# Patient Record
Sex: Male | Born: 1965 | Race: Black or African American | Hispanic: No
Health system: Southern US, Academic
[De-identification: ages and names within clinical notes are randomized; demographics above are authoritative.]

## PROBLEM LIST (undated history)

## (undated) ENCOUNTER — Ambulatory Visit

## (undated) ENCOUNTER — Ambulatory Visit: Payer: MEDICARE

## (undated) ENCOUNTER — Encounter: Attending: Infectious Disease | Primary: Infectious Disease

## (undated) ENCOUNTER — Encounter: Attending: Physician Assistant | Primary: Physician Assistant

## (undated) ENCOUNTER — Telehealth

## (undated) ENCOUNTER — Telehealth: Attending: Infectious Disease | Primary: Infectious Disease

## (undated) ENCOUNTER — Encounter: Attending: Internal Medicine | Primary: Internal Medicine

## (undated) ENCOUNTER — Encounter

## (undated) ENCOUNTER — Ambulatory Visit: Payer: MEDICAID

## (undated) ENCOUNTER — Telehealth: Attending: Registered" | Primary: Registered"

## (undated) ENCOUNTER — Encounter
Attending: Student in an Organized Health Care Education/Training Program | Primary: Student in an Organized Health Care Education/Training Program

## (undated) ENCOUNTER — Ambulatory Visit: Payer: MEDICARE | Attending: Infectious Disease | Primary: Infectious Disease

## (undated) ENCOUNTER — Ambulatory Visit: Payer: MEDICARE | Attending: Dermatology | Primary: Dermatology

## (undated) ENCOUNTER — Ambulatory Visit: Payer: MEDICAID | Attending: Infectious Disease | Primary: Infectious Disease

## (undated) ENCOUNTER — Telehealth
Attending: Student in an Organized Health Care Education/Training Program | Primary: Student in an Organized Health Care Education/Training Program

## (undated) ENCOUNTER — Telehealth: Attending: Dermatology | Primary: Dermatology

## (undated) ENCOUNTER — Encounter: Attending: Dermatology | Primary: Dermatology

## (undated) ENCOUNTER — Ambulatory Visit: Payer: Medicare (Managed Care)

## (undated) ENCOUNTER — Ambulatory Visit: Payer: MEDICARE | Attending: Family | Primary: Family

## (undated) ENCOUNTER — Ambulatory Visit: Payer: MEDICARE | Attending: General Practice | Primary: General Practice

## (undated) ENCOUNTER — Ambulatory Visit: Payer: MEDICARE | Attending: Physician Assistant | Primary: Physician Assistant

## (undated) ENCOUNTER — Ambulatory Visit: Payer: Medicare (Managed Care) | Attending: Physician Assistant | Primary: Physician Assistant

## (undated) ENCOUNTER — Ambulatory Visit
Payer: MEDICARE | Attending: Student in an Organized Health Care Education/Training Program | Primary: Student in an Organized Health Care Education/Training Program

## (undated) ENCOUNTER — Encounter: Attending: Gastroenterology | Primary: Gastroenterology

## (undated) ENCOUNTER — Ambulatory Visit: Payer: MEDICARE | Attending: Addiction (Substance Use Disorder) | Primary: Addiction (Substance Use Disorder)

## (undated) ENCOUNTER — Ambulatory Visit
Attending: Student in an Organized Health Care Education/Training Program | Primary: Student in an Organized Health Care Education/Training Program

## (undated) ENCOUNTER — Encounter: Attending: Addiction (Substance Use Disorder) | Primary: Addiction (Substance Use Disorder)

## (undated) ENCOUNTER — Ambulatory Visit: Payer: MEDICARE | Attending: Registered" | Primary: Registered"

## (undated) ENCOUNTER — Ambulatory Visit
Payer: Medicare (Managed Care) | Attending: Student in an Organized Health Care Education/Training Program | Primary: Student in an Organized Health Care Education/Training Program

## (undated) ENCOUNTER — Ambulatory Visit: Attending: Infectious Disease | Primary: Infectious Disease

## (undated) DIAGNOSIS — I10 Essential (primary) hypertension: Secondary | ICD-10-CM

## (undated) DIAGNOSIS — G459 Transient cerebral ischemic attack, unspecified: Secondary | ICD-10-CM

## (undated) DIAGNOSIS — J449 Chronic obstructive pulmonary disease, unspecified: Secondary | ICD-10-CM

## (undated) DIAGNOSIS — K859 Acute pancreatitis without necrosis or infection, unspecified: Secondary | ICD-10-CM

## (undated) DIAGNOSIS — K219 Gastro-esophageal reflux disease without esophagitis: Secondary | ICD-10-CM

## (undated) DIAGNOSIS — Z720 Tobacco use: Secondary | ICD-10-CM

## (undated) DIAGNOSIS — I8290 Acute embolism and thrombosis of unspecified vein: Secondary | ICD-10-CM

## (undated) DIAGNOSIS — Z8661 Personal history of infections of the central nervous system: Secondary | ICD-10-CM

## (undated) DIAGNOSIS — E119 Type 2 diabetes mellitus without complications: Secondary | ICD-10-CM

## (undated) DIAGNOSIS — B20 Human immunodeficiency virus [HIV] disease: Secondary | ICD-10-CM

## (undated) DIAGNOSIS — K862 Cyst of pancreas: Secondary | ICD-10-CM

## (undated) DIAGNOSIS — M542 Cervicalgia: Secondary | ICD-10-CM

## (undated) HISTORY — PX: ESOPHAGEAL ATRESIA REPAIR: SHX1525

---
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
## 2003-09-27 ENCOUNTER — Other Ambulatory Visit: Payer: Self-pay

## 2005-02-01 ENCOUNTER — Other Ambulatory Visit: Payer: Self-pay

## 2005-02-01 ENCOUNTER — Emergency Department: Payer: Self-pay | Admitting: Emergency Medicine

## 2005-05-13 ENCOUNTER — Emergency Department: Payer: Self-pay | Admitting: Emergency Medicine

## 2005-07-23 ENCOUNTER — Emergency Department: Payer: Self-pay | Admitting: Emergency Medicine

## 2006-02-12 ENCOUNTER — Emergency Department: Payer: Self-pay | Admitting: Emergency Medicine

## 2006-12-30 ENCOUNTER — Emergency Department: Payer: Self-pay | Admitting: Unknown Physician Specialty

## 2007-05-21 ENCOUNTER — Emergency Department: Payer: Self-pay | Admitting: Unknown Physician Specialty

## 2007-06-21 ENCOUNTER — Emergency Department: Payer: Self-pay | Admitting: Emergency Medicine

## 2008-10-26 ENCOUNTER — Emergency Department: Payer: Self-pay | Admitting: Emergency Medicine

## 2009-03-24 ENCOUNTER — Inpatient Hospital Stay: Payer: Self-pay | Admitting: Internal Medicine

## 2010-11-07 DIAGNOSIS — H31009 Unspecified chorioretinal scars, unspecified eye: Secondary | ICD-10-CM | POA: Insufficient documentation

## 2011-01-25 ENCOUNTER — Emergency Department: Payer: Self-pay | Admitting: Emergency Medicine

## 2011-05-26 ENCOUNTER — Emergency Department: Payer: Self-pay | Admitting: Internal Medicine

## 2011-07-29 ENCOUNTER — Emergency Department: Payer: Self-pay | Admitting: Emergency Medicine

## 2011-07-29 LAB — CBC WITH DIFFERENTIAL/PLATELET
Basophil #: 0 10*3/uL (ref 0.0–0.1)
Eosinophil #: 0.3 10*3/uL (ref 0.0–0.7)
HCT: 43.4 % (ref 40.0–52.0)
Lymphocyte #: 1.2 10*3/uL (ref 1.0–3.6)
Lymphocyte %: 8.4 %
MCHC: 34.4 g/dL (ref 32.0–36.0)
MCV: 115 fL — ABNORMAL HIGH (ref 80–100)
Neutrophil #: 12.1 10*3/uL — ABNORMAL HIGH (ref 1.4–6.5)
Platelet: 365 10*3/uL (ref 150–440)
RBC: 3.77 10*6/uL — ABNORMAL LOW (ref 4.40–5.90)
RDW: 14.5 % (ref 11.5–14.5)

## 2011-07-29 LAB — URINALYSIS, COMPLETE
Ketone: NEGATIVE
Leukocyte Esterase: NEGATIVE
Nitrite: NEGATIVE
Ph: 7 (ref 4.5–8.0)
Protein: NEGATIVE
RBC,UR: <1 /HPF (ref 0–5)
Squamous Epithelial: 1
WBC UR: <1 /HPF (ref 0–5)

## 2011-07-29 LAB — COMPREHENSIVE METABOLIC PANEL
Albumin: 3.9 g/dL (ref 3.4–5.0)
Anion Gap: 13 (ref 7–16)
BUN: 6 mg/dL — ABNORMAL LOW (ref 7–18)
Bilirubin,Total: 0.5 mg/dL (ref 0.2–1.0)
Chloride: 101 mmol/L (ref 98–107)
Creatinine: 0.88 mg/dL (ref 0.60–1.30)
EGFR (Non-African Amer.): >60
Glucose: 98 mg/dL (ref 65–99)
Potassium: 3.4 mmol/L — ABNORMAL LOW (ref 3.5–5.1)
SGOT(AST): 16 U/L (ref 15–37)
SGPT (ALT): 21 U/L
Total Protein: 7.8 g/dL (ref 6.4–8.2)

## 2013-02-15 DIAGNOSIS — K859 Acute pancreatitis without necrosis or infection, unspecified: Secondary | ICD-10-CM | POA: Insufficient documentation

## 2013-02-15 DIAGNOSIS — F17200 Nicotine dependence, unspecified, uncomplicated: Secondary | ICD-10-CM | POA: Insufficient documentation

## 2013-06-18 ENCOUNTER — Emergency Department: Payer: Self-pay | Admitting: Emergency Medicine

## 2013-06-18 LAB — RAPID INFLUENZA A&B ANTIGENS

## 2013-10-25 DIAGNOSIS — I82409 Acute embolism and thrombosis of unspecified deep veins of unspecified lower extremity: Secondary | ICD-10-CM | POA: Insufficient documentation

## 2014-01-10 ENCOUNTER — Inpatient Hospital Stay: Payer: Self-pay | Admitting: Internal Medicine

## 2014-01-10 LAB — CBC WITH DIFFERENTIAL/PLATELET
BASOS ABS: 0.1 10*3/uL (ref 0.0–0.1)
BASOS PCT: 0.9 %
BASOS PCT: 1 %
Basophil #: 0.1 10*3/uL (ref 0.0–0.1)
EOS ABS: 0.1 10*3/uL (ref 0.0–0.7)
EOS PCT: 0.8 %
EOS PCT: 1.3 %
Eosinophil #: 0.1 10*3/uL (ref 0.0–0.7)
HCT: 38.7 % — AB (ref 40.0–52.0)
HCT: 37.9 % — ABNORMAL LOW (ref 40.0–52.0)
HGB: 12.9 g/dL — ABNORMAL LOW (ref 13.0–18.0)
HGB: 12.6 g/dL — ABNORMAL LOW (ref 13.0–18.0)
LYMPHS PCT: 27.5 %
Lymphocyte #: 1.8 10*3/uL (ref 1.0–3.6)
Lymphocyte #: 2.1 10*3/uL (ref 1.0–3.6)
Lymphocyte %: 22.4 %
MCH: 30 pg (ref 26.0–34.0)
MCH: 30.1 pg (ref 26.0–34.0)
MCHC: 33.3 g/dL (ref 32.0–36.0)
MCHC: 33.2 g/dL (ref 32.0–36.0)
MCV: 90 fL (ref 80–100)
MCV: 91 fL (ref 80–100)
MONO ABS: 0.7 x10 3/mm (ref 0.2–1.0)
MONOS PCT: 8.9 %
Monocyte #: 0.8 x10 3/mm (ref 0.2–1.0)
Monocyte %: 10.1 %
NEUTROS ABS: 4.6 10*3/uL (ref 1.4–6.5)
Neutrophil #: 5.3 10*3/uL (ref 1.4–6.5)
Neutrophil %: 65.8 %
Neutrophil %: 61.3 %
Platelet: 249 10*3/uL (ref 150–440)
Platelet: 252 10*3/uL (ref 150–440)
RBC: 4.29 10*6/uL — AB (ref 4.40–5.90)
RBC: 4.18 10*6/uL — ABNORMAL LOW (ref 4.40–5.90)
RDW: 14.8 % — AB (ref 11.5–14.5)
RDW: 15.1 % — AB (ref 11.5–14.5)
WBC: 8.1 10*3/uL (ref 3.8–10.6)
WBC: 7.6 10*3/uL (ref 3.8–10.6)

## 2014-01-10 LAB — COMPREHENSIVE METABOLIC PANEL
ALBUMIN: 3.6 g/dL (ref 3.4–5.0)
ALK PHOS: 98 U/L
Anion Gap: 12 (ref 7–16)
BILIRUBIN TOTAL: 0.3 mg/dL (ref 0.2–1.0)
BUN: 6 mg/dL — ABNORMAL LOW (ref 7–18)
Calcium, Total: 8.7 mg/dL (ref 8.5–10.1)
Chloride: 100 mmol/L (ref 98–107)
Co2: 27 mmol/L (ref 21–32)
Creatinine: 0.95 mg/dL (ref 0.60–1.30)
EGFR (Non-African Amer.): >60
GLUCOSE: 114 mg/dL — AB (ref 65–99)
Osmolality: 276 (ref 275–301)
Potassium: 2.6 mmol/L — ABNORMAL LOW (ref 3.5–5.1)
SGOT(AST): 18 U/L (ref 15–37)
SGPT (ALT): 21 U/L (ref 12–78)
Sodium: 139 mmol/L (ref 136–145)
Total Protein: 7.2 g/dL (ref 6.4–8.2)

## 2014-01-10 LAB — URINALYSIS, COMPLETE
Bacteria: NONE SEEN
Bilirubin,UR: NEGATIVE
Glucose,UR: NEGATIVE mg/dL (ref 0–75)
Ketone: NEGATIVE
LEUKOCYTE ESTERASE: NEGATIVE
Nitrite: NEGATIVE
Ph: 5 (ref 4.5–8.0)
Protein: NEGATIVE
SPECIFIC GRAVITY: 1.023 (ref 1.003–1.030)
Squamous Epithelial: NONE SEEN
WBC UR: 2 /HPF (ref 0–5)

## 2014-01-10 LAB — CBC
HCT: 44.2 % (ref 40.0–52.0)
HGB: 14.5 g/dL (ref 13.0–18.0)
MCH: 29.6 pg (ref 26.0–34.0)
MCHC: 32.8 g/dL (ref 32.0–36.0)
MCV: 90 fL (ref 80–100)
PLATELETS: 265 10*3/uL (ref 150–440)
RBC: 4.9 10*6/uL (ref 4.40–5.90)
RDW: 15.2 % — AB (ref 11.5–14.5)
WBC: 7.1 10*3/uL (ref 3.8–10.6)

## 2014-01-10 LAB — MAGNESIUM: MAGNESIUM: 1.5 mg/dL — AB

## 2014-01-10 LAB — PROTIME-INR
INR: >15.1
INR: 2
Prothrombin Time: 22.2 secs — ABNORMAL HIGH (ref 11.5–14.7)

## 2014-01-10 LAB — ETHANOL: Ethanol %: <0.003 % (ref 0.000–0.080)

## 2014-01-10 LAB — LIPASE, BLOOD: LIPASE: 177 U/L (ref 73–393)

## 2014-01-10 LAB — AMYLASE: AMYLASE: 53 U/L (ref 25–115)

## 2014-01-10 LAB — APTT: Activated PTT: 130.8 secs — ABNORMAL HIGH (ref 23.6–35.9)

## 2014-01-11 LAB — CBC WITH DIFFERENTIAL/PLATELET
BASOS ABS: 0.1 10*3/uL (ref 0.0–0.1)
Basophil %: 1 %
EOS ABS: 0.1 10*3/uL (ref 0.0–0.7)
EOS PCT: 2.6 %
HCT: 35.9 % — AB (ref 40.0–52.0)
HGB: 11.9 g/dL — AB (ref 13.0–18.0)
LYMPHS PCT: 44 %
Lymphocyte #: 2.3 10*3/uL (ref 1.0–3.6)
MCH: 30.1 pg (ref 26.0–34.0)
MCHC: 33 g/dL (ref 32.0–36.0)
MCV: 91 fL (ref 80–100)
Monocyte #: 0.5 x10 3/mm (ref 0.2–1.0)
Monocyte %: 9.2 %
NEUTROS ABS: 2.2 10*3/uL (ref 1.4–6.5)
NEUTROS PCT: 43.2 %
PLATELETS: 239 10*3/uL (ref 150–440)
RBC: 3.93 10*6/uL — ABNORMAL LOW (ref 4.40–5.90)
RDW: 15 % — AB (ref 11.5–14.5)
WBC: 5.2 10*3/uL (ref 3.8–10.6)

## 2014-01-11 LAB — PROTIME-INR
INR: 2.3
Prothrombin Time: 24.5 secs — ABNORMAL HIGH (ref 11.5–14.7)

## 2014-01-11 LAB — POTASSIUM: Potassium: 3.7 mmol/L (ref 3.5–5.1)

## 2014-01-20 ENCOUNTER — Inpatient Hospital Stay: Payer: Self-pay | Admitting: Specialist

## 2014-01-20 LAB — CBC WITH DIFFERENTIAL/PLATELET
BASOS ABS: 0.1 10*3/uL (ref 0.0–0.1)
BASOS PCT: 1.4 %
EOS ABS: 0.2 10*3/uL (ref 0.0–0.7)
Eosinophil %: 2.2 %
HCT: 40.2 % (ref 40.0–52.0)
HGB: 13.2 g/dL (ref 13.0–18.0)
LYMPHS PCT: 28.9 %
Lymphocyte #: 2.2 10*3/uL (ref 1.0–3.6)
MCH: 30 pg (ref 26.0–34.0)
MCHC: 32.8 g/dL (ref 32.0–36.0)
MCV: 91 fL (ref 80–100)
MONO ABS: 0.6 x10 3/mm (ref 0.2–1.0)
Monocyte %: 8.4 %
NEUTROS ABS: 4.4 10*3/uL (ref 1.4–6.5)
Neutrophil %: 59.1 %
PLATELETS: 349 10*3/uL (ref 150–440)
RBC: 4.41 10*6/uL (ref 4.40–5.90)
RDW: 15.7 % — AB (ref 11.5–14.5)
WBC: 7.4 10*3/uL (ref 3.8–10.6)

## 2014-01-20 LAB — COMPREHENSIVE METABOLIC PANEL
ANION GAP: 10 (ref 7–16)
AST: 11 U/L — AB (ref 15–37)
Albumin: 3.5 g/dL (ref 3.4–5.0)
Alkaline Phosphatase: 99 U/L
BILIRUBIN TOTAL: 0.3 mg/dL (ref 0.2–1.0)
BUN: 10 mg/dL (ref 7–18)
CALCIUM: 8.7 mg/dL (ref 8.5–10.1)
CO2: 24 mmol/L (ref 21–32)
Chloride: 107 mmol/L (ref 98–107)
Creatinine: 0.98 mg/dL (ref 0.60–1.30)
EGFR (African American): >60
EGFR (Non-African Amer.): >60
Glucose: 138 mg/dL — ABNORMAL HIGH (ref 65–99)
Osmolality: 282 (ref 275–301)
Potassium: 2.9 mmol/L — ABNORMAL LOW (ref 3.5–5.1)
SGPT (ALT): 18 U/L
Sodium: 141 mmol/L (ref 136–145)
TOTAL PROTEIN: 6.8 g/dL (ref 6.4–8.2)

## 2014-01-20 LAB — URINALYSIS, COMPLETE
BILIRUBIN, UR: NEGATIVE
Bacteria: NONE SEEN
GLUCOSE, UR: NEGATIVE mg/dL (ref 0–75)
Ketone: NEGATIVE
LEUKOCYTE ESTERASE: NEGATIVE
Nitrite: NEGATIVE
Ph: 5 (ref 4.5–8.0)
Protein: 30
SPECIFIC GRAVITY: 1.025 (ref 1.003–1.030)
SQUAMOUS EPITHELIAL: NONE SEEN
WBC UR: 7 /HPF (ref 0–5)

## 2014-01-20 LAB — PROTIME-INR
INR: 10.4
PROTHROMBIN TIME: 78 s — AB (ref 11.5–14.7)

## 2014-01-21 LAB — CBC WITH DIFFERENTIAL/PLATELET
BASOS PCT: 0.8 %
Basophil #: 0.1 10*3/uL (ref 0.0–0.1)
Eosinophil #: 0.2 10*3/uL (ref 0.0–0.7)
Eosinophil %: 2.8 %
HCT: 37.1 % — ABNORMAL LOW (ref 40.0–52.0)
HGB: 12.4 g/dL — AB (ref 13.0–18.0)
Lymphocyte #: 2.4 10*3/uL (ref 1.0–3.6)
Lymphocyte %: 36.5 %
MCH: 30.3 pg (ref 26.0–34.0)
MCHC: 33.5 g/dL (ref 32.0–36.0)
MCV: 90 fL (ref 80–100)
MONO ABS: 0.6 x10 3/mm (ref 0.2–1.0)
Monocyte %: 9.8 %
Neutrophil #: 3.3 10*3/uL (ref 1.4–6.5)
Neutrophil %: 50.1 %
Platelet: 327 10*3/uL (ref 150–440)
RBC: 4.1 10*6/uL — ABNORMAL LOW (ref 4.40–5.90)
RDW: 15.6 % — ABNORMAL HIGH (ref 11.5–14.5)
WBC: 6.6 10*3/uL (ref 3.8–10.6)

## 2014-01-21 LAB — PROTIME-INR
INR: 14.5
Prothrombin Time: 100.8 secs — ABNORMAL HIGH (ref 11.5–14.7)

## 2014-01-22 LAB — PROTIME-INR
INR: 2.1
PROTHROMBIN TIME: 22.8 s — AB (ref 11.5–14.7)

## 2014-08-04 ENCOUNTER — Emergency Department: Payer: Self-pay | Admitting: Emergency Medicine

## 2014-08-04 LAB — CBC WITH DIFFERENTIAL/PLATELET
Basophil #: 0 10*3/uL (ref 0.0–0.1)
Basophil %: 0.6 %
EOS PCT: 2.7 %
Eosinophil #: 0.2 10*3/uL (ref 0.0–0.7)
HCT: 40.9 % (ref 40.0–52.0)
HGB: 13.8 g/dL (ref 13.0–18.0)
LYMPHS ABS: 2.8 10*3/uL (ref 1.0–3.6)
Lymphocyte %: 39.6 %
MCH: 31.4 pg (ref 26.0–34.0)
MCHC: 33.7 g/dL (ref 32.0–36.0)
MCV: 93 fL (ref 80–100)
MONOS PCT: 7.9 %
Monocyte #: 0.6 x10 3/mm (ref 0.2–1.0)
NEUTROS PCT: 49.2 %
Neutrophil #: 3.5 10*3/uL (ref 1.4–6.5)
Platelet: 282 10*3/uL (ref 150–440)
RBC: 4.39 10*6/uL — AB (ref 4.40–5.90)
RDW: 14 % (ref 11.5–14.5)
WBC: 7 10*3/uL (ref 3.8–10.6)

## 2014-08-04 LAB — URINALYSIS, COMPLETE
Bacteria: NONE SEEN
Bilirubin,UR: NEGATIVE
Blood: NEGATIVE
Glucose,UR: NEGATIVE mg/dL (ref 0–75)
Ketone: NEGATIVE
Leukocyte Esterase: NEGATIVE
NITRITE: NEGATIVE
Ph: 5 (ref 4.5–8.0)
Specific Gravity: 1.034 (ref 1.003–1.030)
Squamous Epithelial: 1

## 2014-08-04 LAB — COMPREHENSIVE METABOLIC PANEL
ALT: 13 U/L — AB (ref 14–63)
ANION GAP: 8 (ref 7–16)
Albumin: 3.6 g/dL (ref 3.4–5.0)
Alkaline Phosphatase: 75 U/L (ref 46–116)
BUN: 10 mg/dL (ref 7–18)
Bilirubin,Total: 0.2 mg/dL (ref 0.2–1.0)
CALCIUM: 9.1 mg/dL (ref 8.5–10.1)
CHLORIDE: 109 mmol/L — AB (ref 98–107)
Co2: 26 mmol/L (ref 21–32)
Creatinine: 0.91 mg/dL (ref 0.60–1.30)
EGFR (Non-African Amer.): >60
Glucose: 136 mg/dL — ABNORMAL HIGH (ref 65–99)
Osmolality: 286 (ref 275–301)
Potassium: 3.2 mmol/L — ABNORMAL LOW (ref 3.5–5.1)
SGOT(AST): 13 U/L — ABNORMAL LOW (ref 15–37)
Sodium: 143 mmol/L (ref 136–145)
Total Protein: 6.7 g/dL (ref 6.4–8.2)

## 2014-08-04 LAB — LIPASE, BLOOD: LIPASE: 448 U/L — AB (ref 73–393)

## 2014-08-04 LAB — TROPONIN I: Troponin-I: <0.02 ng/mL

## 2014-10-22 NOTE — H&P (Signed)
PATIENT NAME:  Mason Martin, Mason Martin MR#:  [id number] DATE OF BIRTH:  07-22-1965  DATE OF ADMISSION:  01/20/2014  PRIMARY CARE PHYSICIAN: [facility].   CHIEF COMPLAINT: The patient's INR is high.   HISTORY OF PRESENT ILLNESS: Mason Martin is a 49 year old male with a history of HIV, hypertension. Was diagnosed with thrombosis of one of the abdominal vessels and has been on Coumadin. The patient was admitted on 01/10/2014 for supertherapeutic INR with an INR of greater than 15. The patient received vitamin K with improvement of the INR to 2. The patient was also found to have left hand cellulitis. The patient was discharged with Keflex. The patient followed up at [facility], and today, the patient was called to go to the Emergency Department concerning about the patient's high INR of greater than 16 and the patient was also found to have hematuria.. Denies having any abdominal pain. Repeat INR in the Emergency Department shows INR of 10.4. Marland Kitchen Urine shows 683 RBCs.   PAST MEDICAL HISTORY:  1. HIV, on HAART therapy.  2. COPD.   3. Previous history of stroke.  4. History of meningitis.  5. Gastroesophageal reflux disease.    PAST SURGICAL HISTORY: Esophageal trauma from endoscopy requiring surgical repair.   ALLERGIES: SULFA DRUGS.   HOME MEDICATIONS:  1. Coumadin 2 mg once a day.  2.  50 mg once a day.  3. ProAir 2 puffs 4 times a day.  4. Prezista 800 mg once a day.  5. Omeprazole 20 mg once a day.  6. Norvir 100 mg 2 times a day.  7. Nexium 40 mg 2 times a day.  8. Enalapril 20 mg once a day.  9. Creon 24,000 units 3 times a day.  10. Cetirizine 10 mg once a day.  11. Keflex 500 mg 4 times a day.  12. Norco 5/325 mg every 6 hours as needed.   SOCIAL HISTORY: Continues to smoke 1 pack in 2 to 3 days. Drinks alcohol 1 to 2 beers on a daily basis. Denies using any illicit drugs. Currently lives with his girlfriend and her daughter.   FAMILY HISTORY: Father died of emphysema and  congestive heart failure.   REVIEW OF SYSTEMS:  CONSTITUTIONAL: Denies any generalized weakness.  EYES: No change in vision.  ENT: No change in hearing.  RESPIRATORY: No cough, shortness of breath.  CARDIOVASCULAR: No chest pain, palpitations.  GASTROINTESTINAL: No nausea, vomiting, abdominal pain.  GENITOURINARY: No dysuria .   HEMATOLOGIC: Has noticed to have some blood in the stool.  ENDOCRINE: No polyuria or polydipsia.  NEUROLOGIC: No weakness or numbness in any part of the body.  SKIN: No rash or lesions.  MUSCULOSKELETAL: No joint pains and aches.   PHYSICAL EXAMINATION:  GENERAL: This is a thin-built male lying down in the bed, not in distress.  VITAL SIGNS: Temperature 98.3, pulse 67, blood pressure 121/82, respiratory rate of 19, oxygen saturations 98% on room air.  HEENT: Head normocephalic, atraumatic. There is no scleral icterus. Conjunctivae normal. Pupils equal and reactive. Extraocular movements are intact. Mucous membranes moist. No pharyngeal erythema.  NECK: Supple. No lymphadenopathy. No JVD. No carotid bruit.  CHEST: Has no focal tenderness.  LUNGS: Bilaterally clear to auscultation.  HEART: S1, S2 regular. No murmurs are heard.  ABDOMEN: Bowel sounds present. Soft, nontender, nondistended. No hepatosplenomegaly.  EXTREMITIES: No pedal edema. Pulses 2+.  SKIN: No rash or lesions.  MUSCULOSKELETAL: Good range of motion in all of the extremities.  NEUROLOGIC: The patient is alert, oriented to place, person and time. Cranial nerves II through XII intact. Motor 5/5 in upper and lower extremities.   LABORATORY DATA: CBC and CMP are completely within normal limits except for potassium of 2.9. PT 78, INR of 10.4. Urinalysis shows 683 RBCs, negative for nitrites and leukocyte esterase. Stool occult is positive.   ASSESSMENT AND PLAN: Mason Martin is a 49 year old with known history of human immunodeficiency virus who comes with supertherapeutic INR, with hematuria and stool  occult being positive.  1. Supertherapeutic INR from Coumadin toxicity: Hold the Coumadin. Give 5 mg of vitamin K. Follow up the INR in the morning. The patient does not have any gross hematuria or gross gastrointestinal bleed. Will continue to follow up. The patient's hemoglobin is currently in acceptable drainage. Will repeat the CBC in the morning. This most likely has worsened secondary to antibiotics.  2. Hypokalemia: Will replace by mouth.  3. Hematuria: The patient will need to have a repeat urinalysis once INR is in the therapeutic range. Considering the patient's history of heavy tobacco use as well as history of human immunodeficiency virus, the patient may benefit from evaluating the patient's hematuria if hematuria persists.  4. Human immunodeficiency virus: Continue with the home medications.  5. Gastroesophageal reflux disease: Continue with Protonix.  6. The patient is already on therpaeutic INR which should provide deep vein prophylaxis.   TIME SPENT: 55 minutes.   ____________________________ Susa GriffinsPadmaja Paden Kuras, MD pv:gb D: 01/20/2014 23:17:35 ET T: 01/21/2014 00:05:27 ET JOB#: [id number]  cc: Susa GriffinsPadmaja Kristof Nadeem, MD, <Dictator> Susa GriffinsPADMAJA Molly Savarino MD ELECTRONICALLY SIGNED 01/23/2014 21:15

## 2014-10-22 NOTE — Discharge Summary (Signed)
PATIENT NAME:  Mason Martin, Mason Martin MR#:  [id number] DATE OF BIRTH:  1965-08-10  DATE OF ADMISSION:  01/10/2014 DATE OF DISCHARGE:  01/11/2014  DISCHARGE DIAGNOSES:  1.  Abdominal pain, nausea and vomiting secondary to constipation, resolved. 2.  Human immunodeficiency virus on HAART medication.  3.  Hypokalemia.  4.  Left hand cellulitis.  5.  Chronic pancreatitis.   DISCHARGE MEDICATIONS: 1.  Keflex 500 mg p.o. 4 times daily for 10 days.  2.  Omeprazole 20 mg p.o. daily. 3.  Percocet 5/325 one  tab q 6hr  prn 4.  Coumadin 2 mg p.o. daily.  5.  keflex 500 mg po 4 times daily  6.  Prezista 800 mg p.o. daily. 7.  Nexium 40 mg p.o. b.i.d. 8.  Norvir 100 mg p.o. b.i.d.  9.  Cetirizine 10 mg daily. 10.   Creon 24,000/76,000/120,000 one capsule p.o. t.i.d.   CONSULTATIONS: None.   HOSPITAL COURSE:  1.  The patient is 49 year old male patient [facility] came in because of abdominal pain, nausea and vomiting, found to have constipation. His CAT scan showed ileus, did not show any acute changes, so the patient was started on stool softeners and he had a bowel movement, and the patient's abdominal pain resolved. 2.  Hypokalemia. Potassium improved with replacement.  3.  Supratherapeutic INR. The patient's INR was 15. He uses Coumadin for chronic A-fib. The patient received vitamin K and INR at time of discharge was normal and it was down from 15 2.3 at the time of discharge so we advised him to continue Coumadin and follow up with primary doctor.   TIME SPENT: More than 30 minutes.   ____________________________ Katha HammingSnehalatha Romond Pipkins, MD sk:sb D: 01/12/2014 22:45:49 ET T: 01/13/2014 07:28:37 ET JOB#: [id number]  cc: Katha HammingSnehalatha Deona Novitski, MD, <Dictator> Katha HammingSNEHALATHA Laysha Childers MD ELECTRONICALLY SIGNED 01/28/2014 8:30

## 2014-10-22 NOTE — H&P (Signed)
PATIENT NAME:  Mason Martin, Mason Martin MR#:  [id number] DATE OF BIRTH:  1966-02-17  DATE OF ADMISSION:  01/10/2014  PRIMARY CARE PHYSICIAN: Internal Medicine at [facility].   REFERRING PHYSICIAN: Dr. Suella Broad.   CHIEF COMPLAINT: Abdominal pain.   HISTORY OF PRESENT ILLNESS: Mason Martin is a 49 year old male with a history of HIV, hypertension who comes to the Emergency Department with complaints of abdominal pain started in the epigastric area under both ribs, a bandlike pattern, 10/10 in intensity with 4 episodes of vomiting. The patient states has been constipated for the last 2 days which is unusual for the patient, usually has 1 to 2 bowel movements a day. Took some castor oil without much success. Started to have abdominal pain yesterday morning, took some Tylenol without much improvement. After having multiple episodes of vomiting, the patient came to the Emergency Department. Workup in the Emergency Department: The patient's lipase is within normal limits. The patient used to drink heavily; however, has been drinking 1 to 2 beers a day. 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 The patient has been on Coumadin and trying to adjust the Coumadin levels. The patient was found to have INR greater than 15. The patient is currently receiving 10 mg of IV vitamin K. The patient was found to have stool occult positive. The patient was given multiple doses of Dilaudid in the Emergency Department. The patient noted to have swelling in the right palm when he woke up yesterday. However, it has significantly worsened since then.   PAST MEDICAL HISTORY:  1. Gastroesophageal reflux disease.  2. Previous history of pancreatitis.  3. COPD. 4. Previously history of a stroke. 5. History of meningitis. 6. HIV on HAART therapy. 7. Ventricular vein thrombosis.   PAST SURGICAL HISTORY: Esophageal trauma from endoscopy requiring surgical repair.   ALLERGIES: SULFA DRUGS.    HOME MEDICATIONS:  1. Coumadin 2 mg once a day.  2.  mg once a day. 3. ProAir 2 puffs 4 times a day.  4. Prezista 800 mg once a day.  5. Norvir 100 mg 3 times a day.  6. Nexium 40 mg 2 times a day.  7. Enalapril 20 mg once a day. 8. Creon 24,000 units 3 times a day.  9. Cetirizine 10 mg once a day.   SOCIAL HISTORY: Continues to smoke 1 pack in 2-3 days. Drinks alcohol 1 to 2 beers a day. Denies using any illicit drugs. Lives with his girlfriend and her daughter.   FAMILY HISTORY: Father died of emphysema and congestive heart failure.   REVIEW OF SYSTEMS:   CONSTITUTIONAL: Experiencing generalized weakness.  EYES: In vision.  ENT: No change in hearing.  RESPIRATORY: No cough, shortness of breath.  CARDIOVASCULAR: No chest pain, palpitations.  GASTROINTESTINAL: Has nausea, vomiting, abdominal pain, constipation.  GENITOURINARY: No dysuria or hematuria.  HEMATOLOGIC: No easy bleeding.  ENDOCRINE: No polyuria or polydipsia.  HEMATOLOGIC: No weakness or numbness in any part of the body.  SKIN: No rash or lesions.  MUSCULOSKELETAL: Has pain in the left thumb associated with swelling.   PHYSICAL EXAMINATION:  GENERAL: This is a thin built male lying down in the bed, not in distress.  VITAL SIGNS: Temperature 99, pulse 72, blood pressure 120/67, respiratory rate of 18, oxygen saturation is 97% on room air.  HEENT: Head normocephalic, atraumatic. There is no scleral icterus. Conjunctivae normal. Pupils equal and react to light. Extraocular movements are intact. Mucous membranes moist. No pharyngeal  erythema.  NECK: Supple. No lymphadenopathy. No JVD. No carotid bruit.  CHEST: Has no focal tenderness.  LUNGS: Bilateral clear to auscultation.  HEART: S1, S2 regular. No murmurs are heard.  ABDOMEN: Bowel sounds present. Soft. Has tenderness in the epigastric and left upper quadrant. No rebound or guarding. No hepatosplenomegaly.  EXTREMITIES: No pedal edema. Pulses 2+ in the lower  extremities. Left hand has significant swelling of the left thumb with a decreased range of motion.   LABORATORIES: CBC and CMP are completely within normal limits except for potassium of 2.6. Lipase is 177. PT more than 120. INR more than 15. Alcohol level of less than 0.003.   ASSESSMENT AND PLAN: Mason Martin is a 49 year old male with known history of alcohol use, a recent diagnosis of mesenteric vein thrombosis, comes with supratherapeutic INR and abdominal pain.  1. Abdominal pain, the possibility of alcohol-induced gastritis caused him to have nausea and vomiting. Keep the patient on a clear liquid diet. If the patient continues to vomit, will keep the patient n.p.o. Keep the patient on Protonix 40 mg q. 12 hours. Continue with IV fluids. We will also get KUB.  2. Supratherapeutic INR. The patient is currently receiving 10 mg of IV vitamin K. Will follow up with PT/INR.  3. Left hand swelling. Concern about if patient has any hemarthrosis in the left joint. Reverse the INR and followup.  4. Hypokalemia, will replace by IV. Will also check the magnesium level.  5. Stool occult positive. The patient states had a recent EGD. This could be from the supratherapeutic INR. However, the patient's hemoglobin is stable. We will continue to follow up.  6. Alcohol use. Keep the patient on thiamine. Also follow closely for any signs of alcohol withdrawal. 7. Continued tobacco use. Counseled with the patient to refrain from tobacco.  8. HIV on HAART therapy. [facility]. Continue on the HAART therapy.  9. Keep the patient on deep vein thrombosis prophylaxis with sequential compression devices.  TIME SPENT: 55 minutes.     ____________________________ Susa GriffinsPadmaja Treyson Axel, MD pv:lt D: 01/10/2014 04:46:42 ET T: 01/10/2014 05:57:19 ET JOB#: [id number]  cc: Susa GriffinsPadmaja Vivian Okelley, MD, <Dictator> Susa GriffinsPADMAJA Beaulah Romanek MD ELECTRONICALLY SIGNED  01/12/2014 0:04

## 2014-10-22 NOTE — Discharge Summary (Signed)
PATIENT NAME:  Mason Martin, Mason Martin MR#:  [id number] DATE OF BIRTH:  1966/02/18  DATE OF ADMISSION:  01/20/2014 DATE OF DISCHARGE:  01/22/2014  For a detailed note, please see the history and physical done on admission by Dr. Heron NayVasireddy.   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   DIET: The patient is being discharged on a low-sodium diet.   ACTIVITY: As tolerated.   FOLLOW-UP: [facility]   DISCHARGE MEDICATIONS: Cetirizine 10 mg daily, Creon 24,000 2 caps 120,000 units delayed capsule 1 capsule t.i.d., enalapril 20 mg daily, Nexium 40 mg b.i.d., Norvir 100 mg b.i.d., Prezista 800 mg daily, albuterol inhaler 2 puffs 4 times daily as needed, warfarin 2 mg daily, Tivicay 50 mg 1 tablet daily.   PERTINENT STUDIES DONE DURING THE HOSPITAL COURSE: None.   BRIEF HOSPITAL COURSE: This is a 49 year old male who presented to the hospital on 01/20/2014 due to supratherapeutic INR as high as 10, and some mild hematuria.   1. Acquired coagulopathy/supratherapeutic INR. This was secondary to the patient being on Coumadin. He was advised by his primary care physician to be admitted because his INR was significantly elevated. He was having some mild hematuria. The patient was given vitamin K. His Coumadin was held. Since then, the patient's INR has now normalized, and is at 2.1 on the day of discharge. He has no further hematuria. No evidence of any melena, hematochezia, or any acute bleeding. He will continue his low-dose Coumadin and have his PT/INR checked at his PCP's office next week.  2. Hypertension. The patient remained hemodynamically stable. He will continue his enalapril.  3. GERD. The patient was maintained on his Protonix. He will resume that. 4. History of chronic pancreatitis. The patient was  maintained on his pancreatic supplements and he will resume that.  5. History of HIV. He will continue his Highly active antiretroviral therapy.  6. History of recent DVT. The patient will continue his Coumadin, as stated, and he will have his PT/INR checked early next week by his primary care physician.   CODE STATUS: The patient is a Full Code.   TIME SPENT: 35 minutes.     ____________________________ Rolly PancakeVivek J. Cherlynn KaiserSainani, MD vjs:jr D: 01/22/2014 14:06:51 ET T: 01/22/2014 17:37:41 ET JOB#: [id number]  cc: Rolly PancakeVivek J. Cherlynn KaiserSainani, MD, <Dictator> Houston SirenVIVEK J Persis Graffius MD ELECTRONICALLY SIGNED 01/27/2014 14:09

## 2015-02-09 ENCOUNTER — Emergency Department
Admission: EM | Admit: 2015-02-09 | Discharge: 2015-02-09 | Disposition: A | Discharge status: Home / Self Care | Payer: Medicare Other | Attending: Emergency Medicine | Admitting: Emergency Medicine

## 2015-02-09 ENCOUNTER — Other Ambulatory Visit: Payer: Self-pay

## 2015-02-09 ENCOUNTER — Encounter: Payer: Self-pay | Admitting: *Deleted

## 2015-02-09 DIAGNOSIS — Z720 Tobacco use: Secondary | ICD-10-CM | POA: Insufficient documentation

## 2015-02-09 DIAGNOSIS — I10 Essential (primary) hypertension: Secondary | ICD-10-CM | POA: Insufficient documentation

## 2015-02-09 DIAGNOSIS — R1013 Epigastric pain: Secondary | ICD-10-CM | POA: Diagnosis not present

## 2015-02-09 DIAGNOSIS — R1010 Upper abdominal pain, unspecified: Secondary | ICD-10-CM | POA: Diagnosis present

## 2015-02-09 HISTORY — DX: Essential (primary) hypertension: I10

## 2015-02-09 HISTORY — DX: Cyst of pancreas: K86.2

## 2015-02-09 HISTORY — DX: Human immunodeficiency virus (HIV) disease: B20

## 2015-02-09 LAB — CBC
HEMATOCRIT: 40.9 % (ref 40.0–52.0)
Hemoglobin: 13.7 g/dL (ref 13.0–18.0)
MCH: 31.4 pg (ref 26.0–34.0)
MCHC: 33.6 g/dL (ref 32.0–36.0)
MCV: 93.5 fL (ref 80.0–100.0)
PLATELETS: 276 10*3/uL (ref 150–440)
RBC: 4.38 MIL/uL — AB (ref 4.40–5.90)
RDW: 14 % (ref 11.5–14.5)
WBC: 5.4 10*3/uL (ref 3.8–10.6)

## 2015-02-09 LAB — COMPREHENSIVE METABOLIC PANEL
ALBUMIN: 3.8 g/dL (ref 3.5–5.0)
ALK PHOS: 75 U/L (ref 38–126)
ALT: 11 U/L — ABNORMAL LOW (ref 17–63)
ANION GAP: 8 (ref 5–15)
AST: 21 U/L (ref 15–41)
BILIRUBIN TOTAL: 0.4 mg/dL (ref 0.3–1.2)
BUN: 8 mg/dL (ref 6–20)
CO2: 28 mmol/L (ref 22–32)
CREATININE: 0.8 mg/dL (ref 0.61–1.24)
Calcium: 8.9 mg/dL (ref 8.9–10.3)
Chloride: 104 mmol/L (ref 101–111)
GFR calc Af Amer: >60 mL/min (ref >60)
GFR calc non Af Amer: >60 mL/min (ref >60)
Glucose, Bld: 91 mg/dL (ref 65–99)
POTASSIUM: 3 mmol/L — AB (ref 3.5–5.1)
Sodium: 140 mmol/L (ref 135–145)
Total Protein: 6.8 g/dL (ref 6.5–8.1)

## 2015-02-09 LAB — LIPASE, BLOOD: LIPASE: 45 U/L (ref 22–51)

## 2015-02-09 MED ORDER — OXYCODONE HCL 5 MG PO TABS: 5.0000 mg | ORAL_TABLET | Freq: Three times a day (TID) | ORAL | Status: DC | PRN

## 2015-02-09 NOTE — Discharge Instructions (Signed)
As we discussed please follow-up with [facility] as soon as possible regarding her continued abdominal pain. Please also make an appointment with GI medicine by calling the number provided for further evaluation. Return to the emergency department for any increased abdominal pain, fever, or nausea and vomiting.   Abdominal Pain Many things can cause abdominal pain. Usually, abdominal pain is not caused by a disease and will improve without treatment. It can often be observed and treated at home. Your health care provider will do a physical exam and possibly order blood tests and X-rays to help determine the seriousness of your pain. However, in many cases, more time must pass before a clear cause of the pain can be found. Before that point, your health care provider may not know if you need more testing or further treatment. HOME CARE INSTRUCTIONS  Monitor your abdominal pain for any changes. The following actions may help to alleviate any discomfort you are experiencing:  Only take over-the-counter or prescription medicines as directed by your health care provider.  Do not take laxatives unless directed to do so by your health care provider.  Try a clear liquid diet (broth, tea, or water) as directed by your health care provider. Slowly move to a bland diet as tolerated. SEEK MEDICAL CARE IF:  You have unexplained abdominal pain.  You have abdominal pain associated with nausea or diarrhea.  You have pain when you urinate or have a bowel movement.  You experience abdominal pain that wakes you in the night.  You have abdominal pain that is worsened or improved by eating food.  You have abdominal pain that is worsened with eating fatty foods.  You have a fever. SEEK IMMEDIATE MEDICAL CARE IF:   Your pain does not go away within 2 hours.  You keep throwing up (vomiting).  Your pain is felt only in portions of the abdomen, such as the right side or the left lower portion of the abdomen.  You  pass bloody or black tarry stools. MAKE SURE YOU:  Understand these instructions.   Will watch your condition.   Will get help right away if you are not doing well or get worse.  Document Released: 03/27/2005 Document Revised: 06/22/2013 Document Reviewed: 02/24/2013 [facility] Patient Information 2015 [place], [place]. This information is not intended to replace advice given to you by your health care provider. Make sure you discuss any questions you have with your health care provider.

## 2015-02-09 NOTE — ED Notes (Signed)
Recent adm for cyst on pancreas, pain returned few days ago, has been tired, no vomiting

## 2015-02-09 NOTE — ED Provider Notes (Signed)
[facility] Emergency Department Provider Note  Time seen: 2:09 PM  I have reviewed the triage vital signs and the nursing notes.   HISTORY  Chief Complaint Abdominal Pain    HPI Mason Martin is a 49 y.o. male with a past medical history of HIV, hypertension, recent pancreatitis return to the emergency department for upper abdominal pain. According to the patient he was diagnosed with pancreatitis 01/16/15 at [facility] He is admitted for several days [facility] discharge home on pain medication. The patient states he has been doing very well today ran out of pain medication 2 days ago. Patient states and turning on pain medication the pain has come back. Denies nausea or vomiting. Denies diarrhea. Last bowel movement was today. Denies fever. Describes his pain as moderate, epigastric, burning sensation.     Past Medical History  Diagnosis Date  . Pancreas cyst   . HIV disease   . Hypertension     There are no active problems to display for this patient.   History reviewed. No pertinent past surgical history.  No current outpatient prescriptions on file.  Allergies Sulfur  No family history on file.  Social History Social History  Substance Use Topics  . Smoking status: Current Every Day Smoker  . Smokeless tobacco: None  . Alcohol Use: Yes     Comment: last 1 qweek    Review of Systems Constitutional: Negative for fever. Cardiovascular: Negative for chest pain. Respiratory: Negative for shortness of breath. Gastrointestinal: Positive for epigastric pain Neurological: Negative for headache 10-point ROS otherwise negative.  ____________________________________________   PHYSICAL EXAM:  VITAL SIGNS: ED Triage Vitals  Enc Vitals Group     BP 02/09/15 1031 123/86 mmHg     Pulse Rate 02/09/15 1031 73     Resp 02/09/15 1031 20     Temp 02/09/15 1031 98.1 F (36.7 C)     Temp Source 02/09/15 1031 Oral     SpO2 02/09/15 1031 98 %     Weight  02/09/15 1031 125 lb (56.7 kg)     Height 02/09/15 1031 5\' 4"  (1.626 m)     Head Cir --      Peak Flow --      Pain Score 02/09/15 1032 8     Pain Loc --      Pain Edu? --      Excl. in GC? --     Constitutional: Alert and oriented. Well appearing and in no distress. Eyes: Normal exam ENT   Mouth/Throat: Mucous membranes are moist. Cardiovascular: Normal rate, regular rhythm. No murmur Respiratory: Normal respiratory effort without tachypnea nor retractions. Breath sounds are clear and equal bilaterally. No wheezes/rales/rhonchi. Gastrointestinal: Soft, moderate epigastric tenderness palpation without rebound or guarding. No distention Musculoskeletal: Nontender with normal range of motion in all extremities.  Neurologic:  Normal speech and language. No gross focal neurologic deficits  Skin:  Skin is warm, dry and intact.  Psychiatric: Mood and affect are normal. Speech and behavior are normal  ____________________________________________    INITIAL IMPRESSION / ASSESSMENT AND PLAN / ED COURSE  Pertinent labs & imaging results that were available during my care of the patient were reviewed by me and considered in my medical decision making (see chart for details).  I reviewed the patient's records, he had a CT at [facility] 01/16/15 showing acute pancreatitis as well as an elevated lipase at that time. The patient was admitted for several days and discharged home on oxycodone. Patient has  normal labs today including a normal lipase. Continues to have moderate epigastric tenderness. I discussed with the patient short course of pain medication and he needs to follow-up with [facility]. The patient is agreeable to this plan. I also discussed with the patient he needs to return to the emergency department if he has worsening abdominal pain or develops a fever and we'll proceed with a repeat CT scan at that time. Currently as the patient has normal labs and a recent CT showing pancreatitis, and now a  normal lipase I do not believe the patient would benefit from CT imaging at this time.  ____________________________________________   FINAL CLINICAL IMPRESSION(S) / ED DIAGNOSES  Epigastric abdominal pain   Minna Antis, MD 02/09/15 [phone]

## 2015-04-11 ENCOUNTER — Encounter: Payer: Self-pay | Admitting: Emergency Medicine

## 2015-04-11 ENCOUNTER — Emergency Department: Payer: Medicare Other

## 2015-04-11 ENCOUNTER — Emergency Department
Admission: EM | Admit: 2015-04-11 | Discharge: 2015-04-11 | Disposition: A | Discharge status: Home / Self Care | Payer: Medicare Other | Attending: Emergency Medicine | Admitting: Emergency Medicine

## 2015-04-11 DIAGNOSIS — Z720 Tobacco use: Secondary | ICD-10-CM | POA: Diagnosis not present

## 2015-04-11 DIAGNOSIS — R109 Unspecified abdominal pain: Secondary | ICD-10-CM

## 2015-04-11 DIAGNOSIS — I10 Essential (primary) hypertension: Secondary | ICD-10-CM | POA: Insufficient documentation

## 2015-04-11 DIAGNOSIS — R1010 Upper abdominal pain, unspecified: Secondary | ICD-10-CM | POA: Diagnosis present

## 2015-04-11 DIAGNOSIS — R05 Cough: Secondary | ICD-10-CM | POA: Insufficient documentation

## 2015-04-11 DIAGNOSIS — R059 Cough, unspecified: Secondary | ICD-10-CM

## 2015-04-11 LAB — URINALYSIS COMPLETE WITH MICROSCOPIC (ARMC ONLY)
BILIRUBIN URINE: NEGATIVE
Bacteria, UA: NONE SEEN
GLUCOSE, UA: NEGATIVE mg/dL
Hgb urine dipstick: NEGATIVE
KETONES UR: NEGATIVE mg/dL
Leukocytes, UA: NEGATIVE
NITRITE: NEGATIVE
PROTEIN: NEGATIVE mg/dL
RBC / HPF: NONE SEEN RBC/hpf (ref 0–5)
Specific Gravity, Urine: 1.016 (ref 1.005–1.030)
pH: 6 (ref 5.0–8.0)

## 2015-04-11 LAB — COMPREHENSIVE METABOLIC PANEL
ALBUMIN: 3.6 g/dL (ref 3.5–5.0)
ALK PHOS: 101 U/L (ref 38–126)
ALT: 10 U/L — AB (ref 17–63)
AST: 25 U/L (ref 15–41)
Anion gap: 8 (ref 5–15)
CO2: 26 mmol/L (ref 22–32)
CREATININE: 0.81 mg/dL (ref 0.61–1.24)
Calcium: 8.4 mg/dL — ABNORMAL LOW (ref 8.9–10.3)
Chloride: 106 mmol/L (ref 101–111)
GFR calc Af Amer: >60 mL/min (ref >60)
GFR calc non Af Amer: >60 mL/min (ref >60)
GLUCOSE: 174 mg/dL — AB (ref 65–99)
Potassium: 2.3 mmol/L — CL (ref 3.5–5.1)
SODIUM: 140 mmol/L (ref 135–145)
Total Bilirubin: 0.6 mg/dL (ref 0.3–1.2)
Total Protein: 6.6 g/dL (ref 6.5–8.1)

## 2015-04-11 LAB — CBC
HCT: 40 % (ref 40.0–52.0)
Hemoglobin: 13.6 g/dL (ref 13.0–18.0)
MCH: 32.1 pg (ref 26.0–34.0)
MCHC: 34.1 g/dL (ref 32.0–36.0)
MCV: 94 fL (ref 80.0–100.0)
PLATELETS: 267 10*3/uL (ref 150–440)
RBC: 4.26 MIL/uL — ABNORMAL LOW (ref 4.40–5.90)
RDW: 14 % (ref 11.5–14.5)
WBC: 5.5 10*3/uL (ref 3.8–10.6)

## 2015-04-11 LAB — LIPASE, BLOOD: Lipase: 46 U/L (ref 22–51)

## 2015-04-11 MED ORDER — DICYCLOMINE HCL 20 MG PO TABS: 20.0000 mg | ORAL_TABLET | Freq: Three times a day (TID) | ORAL | Status: DC | PRN

## 2015-04-11 MED ORDER — POTASSIUM CHLORIDE CRYS ER 20 MEQ PO TBCR
40.0000 meq | EXTENDED_RELEASE_TABLET | Freq: Once | ORAL | Status: AC
  Administered 2015-04-11: 40 meq via ORAL
  Filled 2015-04-11: qty 2

## 2015-04-11 MED ORDER — ALBUTEROL SULFATE (2.5 MG/3ML) 0.083% IN NEBU
5.0000 mg | INHALATION_SOLUTION | Freq: Once | RESPIRATORY_TRACT | Status: AC
  Administered 2015-04-11: 5 mg via RESPIRATORY_TRACT
  Filled 2015-04-11: qty 6

## 2015-04-11 MED ORDER — DICYCLOMINE HCL 10 MG/ML IM SOLN
20.0000 mg | Freq: Once | INTRAMUSCULAR | Status: AC
  Administered 2015-04-11: 20 mg via INTRAMUSCULAR
  Filled 2015-04-11: qty 2

## 2015-04-11 MED ORDER — BENZONATATE 100 MG PO CAPS
100.0000 mg | ORAL_CAPSULE | Freq: Once | ORAL | Status: AC
  Administered 2015-04-11: 100 mg via ORAL
  Filled 2015-04-11: qty 1

## 2015-04-11 MED ORDER — BENZONATATE 100 MG PO CAPS: 100.0000 mg | ORAL_CAPSULE | Freq: Four times a day (QID) | ORAL | Status: DC | PRN

## 2015-04-11 NOTE — ED Provider Notes (Signed)
[facility] Emergency Department Provider Note   ____________________________________________  Time seen: 1820  I have reviewed the triage vital signs and the nursing notes.   HISTORY  Chief Complaint Abdominal Pain and Cough   History limited by: Not Limited   HPI Mason Martin is a 49 y.o. male who presents to the emergency department today with primary complaint of cough. The patient states that he has had a cough for the past few days. He states that it is severe enough that it is keeping him up at night and he is having a hard time sleeping. He states that he is bringing up some small amount of phlegm with his cough. He does not have any associated chest pain with his cough. In addition he has had some congestion. He does state that he has been having some abdominal pain as well. He has not had any vomiting or bloody stool. Denies any fevers.    Past Medical History  Diagnosis Date  . Pancreas cyst   . HIV disease (HCC)   . Hypertension     There are no active problems to display for this patient.   History reviewed. No pertinent past surgical history.  Current Outpatient Rx  Name  Route  Sig  Dispense  Refill  . oxyCODONE (ROXICODONE) 5 MG immediate release tablet   Oral   Take 1 tablet (5 mg total) by mouth every 8 (eight) hours as needed.   15 tablet   0     Allergies Sulfur  History reviewed. No pertinent family history.  Social History Social History  Substance Use Topics  . Smoking status: Current Every Day Smoker  . Smokeless tobacco: None  . Alcohol Use: Yes     Comment: last 1 qweek    Review of Systems  Constitutional: Negative for fever. Cardiovascular: Negative for chest pain. Respiratory: Negative for shortness of breath. Gastrointestinal: Negative for abdominal pain, vomiting and diarrhea. Genitourinary: Negative for dysuria. Musculoskeletal: Negative for back pain. Skin: Negative for rash. Neurological:  Negative for headaches, focal weakness or numbness.  10-point ROS otherwise negative.  ____________________________________________   PHYSICAL EXAM:  VITAL SIGNS: ED Triage Vitals  Enc Vitals Group     BP 04/11/15 1626 140/79 mmHg     Pulse Rate 04/11/15 1626 86     Resp 04/11/15 1626 18     Temp 04/11/15 1626 99.3 F (37.4 C)     Temp Source 04/11/15 1626 Oral     SpO2 04/11/15 1626 100 %     Weight 04/11/15 1626 135 lb (61.236 kg)     Height 04/11/15 1626  (1.626 m)     Head Cir --      Peak Flow --      Pain Score 04/11/15 1627 8   Constitutional: Alert and oriented. Well appearing and in no distress. Occasional cough. Eyes: Conjunctivae are normal. PERRL. Normal extraocular movements. ENT   Head: Normocephalic and atraumatic.   Nose: No congestion/rhinnorhea.   Mouth/Throat: Mucous membranes are moist.   Neck: No stridor. Hematological/Lymphatic/Immunilogical: No cervical lymphadenopathy. Cardiovascular: Normal rate, regular rhythm.  No murmurs, rubs, or gallops. Respiratory: Normal respiratory effort without tachypnea nor retractions. Breath sounds are clear and equal bilaterally. No wheezes/rales/rhonchi. Gastrointestinal: Soft and nontender. No distention. Genitourinary: Deferred Musculoskeletal: Normal range of motion in all extremities. No joint effusions.  No lower extremity tenderness nor edema. Neurologic:  Normal speech and language. No gross focal neurologic deficits are appreciated. Speech is normal.  Skin:  Skin is warm, dry and intact. No rash noted. Psychiatric: Mood and affect are normal. Speech and behavior are normal. Patient exhibits appropriate insight and judgment.  ____________________________________________    LABS (pertinent positives/negatives)  Labs Reviewed  COMPREHENSIVE METABOLIC PANEL - Abnormal; Notable for the following:    Potassium 2.3 (*)    Glucose, Bld 174 (*)    BUN <5 (*)    Calcium 8.4 (*)    ALT 10 (*)     All other components within normal limits  CBC - Abnormal; Notable for the following:    RBC 4.26 (*)    All other components within normal limits  URINALYSIS COMPLETEWITH MICROSCOPIC ([facility] ONLY) - Abnormal; Notable for the following:    Color, Urine YELLOW (*)    APPearance CLEAR (*)    Squamous Epithelial / LPF 0-5 (*)    All other components within normal limits  LIPASE, BLOOD     ____________________________________________   EKG  I, Phineas Semen, attending physician, personally viewed and interpreted this EKG  EKG Time: 1636 Rate: 79 Rhythm: NSR Axis: normal Intervals: qtc 465 QRS: narrow, q waves in V1, V3 ST changes: no st elevation Impression: abnormal ekg  ____________________________________________    RADIOLOGY  Chest x-ray IMPRESSION: There is mild hyperinflation of the lungs, otherwise no evidence of acute cardiopulmonary process.  I, Genella Bas, personally viewed and evaluated these images (plain radiographs) as part of my medical decision making. ____________________________________________   PROCEDURES  Procedure(s) performed: None  Critical Care performed: No  ____________________________________________   INITIAL IMPRESSION / ASSESSMENT AND PLAN / ED COURSE  Pertinent labs & imaging results that were available during my care of the patient were reviewed by me and considered in my medical decision making (see chart for details).  Patient presented to the emergency department today with primary concerns for cough and abdominal pain. The patient blood work did not show any concerning findings. Chest x-ray without any signs of pneumonia. Patient did feel better after Bentyl. Will discharge home with Bentyl and Tessalon Perles.  ____________________________________________   FINAL CLINICAL IMPRESSION(S) / ED DIAGNOSES  Final diagnoses:  Cough  Abdominal pain, unspecified abdominal location     Phineas Semen,  MD 04/11/15 2050

## 2015-04-11 NOTE — ED Notes (Signed)
Pt presents to the ED with continuous sharp  lower abdominal pain for two days. Pt states that he has a runny nose 48 hrs. Pt has taken cough medicine for his congestion but nothing has helped. 9/10 pain.

## 2015-04-11 NOTE — Discharge Instructions (Signed)
Please seek medical attention for any high fevers, chest pain, shortness of breath, change in behavior, persistent vomiting, bloody stool or any other new or concerning symptoms. ° ° °Cough, Adult °Coughing is a reflex that clears your throat and your airways. Coughing helps to heal and protect your lungs. It is normal to cough occasionally, but a cough that happens with other symptoms or lasts a long time may be a sign of a condition that needs treatment. A cough may last only 2-3 weeks (acute), or it may last longer than 8 weeks (chronic). °CAUSES °Coughing is commonly caused by: °· Breathing in substances that irritate your lungs. °· A viral or bacterial respiratory infection. °· Allergies. °· Asthma. °· Postnasal drip. °· Smoking. °· Acid backing up from the stomach into the esophagus (gastroesophageal reflux). °· Certain medicines. °· Chronic lung problems, including COPD (or rarely, lung cancer). °· Other medical conditions such as heart failure. °HOME CARE INSTRUCTIONS  °Pay attention to any changes in your symptoms. Take these actions to help with your discomfort: °· Take medicines only as told by your health care provider. °¨ If you were prescribed an antibiotic medicine, take it as told by your health care provider. Do not stop taking the antibiotic even if you start to feel better. °¨ Talk with your health care provider before you take a cough suppressant medicine. °· Drink enough fluid to keep your urine clear or pale yellow. °· If the air is dry, use a cold steam vaporizer or humidifier in your bedroom or your home to help loosen secretions. °· Avoid anything that causes you to cough at work or at home. °· If your cough is worse at night, try sleeping in a semi-upright position. °· Avoid cigarette smoke. If you smoke, quit smoking. If you need help quitting, ask your health care provider. °· Avoid caffeine. °· Avoid alcohol. °· Rest as needed. °SEEK MEDICAL CARE IF:  °· You have new symptoms. °· You  cough up pus. °· Your cough does not get better after 2-3 weeks, or your cough gets worse. °· You cannot control your cough with suppressant medicines and you are losing sleep. °· You develop pain that is getting worse or pain that is not controlled with pain medicines. °· You have a fever. °· You have unexplained weight loss. °· You have night sweats. °SEEK IMMEDIATE MEDICAL CARE IF: °· You cough up blood. °· You have difficulty breathing. °· Your heartbeat is very fast. °  °This information is not intended to replace advice given to you by your health care provider. Make sure you discuss any questions you have with your health care provider. °  °Document Released: 12/14/2010 Document Revised: 03/08/2015 Document Reviewed: 08/24/2014 °Elsevier Interactive Patient Education ©2016 [employer] ° °

## 2015-06-28 ENCOUNTER — Other Ambulatory Visit: Payer: Self-pay | Admitting: Infectious Diseases

## 2015-06-28 DIAGNOSIS — M5412 Radiculopathy, cervical region: Secondary | ICD-10-CM

## 2015-07-01 ENCOUNTER — Inpatient Hospital Stay
Admission: EM | Admit: 2015-07-01 | Discharge: 2015-07-05 | DRG: 440 | Disposition: A | Discharge status: Home / Self Care | Payer: Medicare Other | Attending: Internal Medicine | Admitting: Internal Medicine

## 2015-07-01 ENCOUNTER — Encounter: Payer: Self-pay | Admitting: Emergency Medicine

## 2015-07-01 ENCOUNTER — Emergency Department: Payer: Medicare Other

## 2015-07-01 DIAGNOSIS — J449 Chronic obstructive pulmonary disease, unspecified: Secondary | ICD-10-CM | POA: Diagnosis present

## 2015-07-01 DIAGNOSIS — Z79891 Long term (current) use of opiate analgesic: Secondary | ICD-10-CM | POA: Diagnosis not present

## 2015-07-01 DIAGNOSIS — I10 Essential (primary) hypertension: Secondary | ICD-10-CM | POA: Diagnosis present

## 2015-07-01 DIAGNOSIS — Z21 Asymptomatic human immunodeficiency virus [HIV] infection status: Secondary | ICD-10-CM | POA: Diagnosis present

## 2015-07-01 DIAGNOSIS — Z882 Allergy status to sulfonamides status: Secondary | ICD-10-CM | POA: Diagnosis not present

## 2015-07-01 DIAGNOSIS — R52 Pain, unspecified: Secondary | ICD-10-CM

## 2015-07-01 DIAGNOSIS — K859 Acute pancreatitis without necrosis or infection, unspecified: Secondary | ICD-10-CM | POA: Diagnosis present

## 2015-07-01 DIAGNOSIS — M5410 Radiculopathy, site unspecified: Secondary | ICD-10-CM | POA: Diagnosis present

## 2015-07-01 DIAGNOSIS — K861 Other chronic pancreatitis: Secondary | ICD-10-CM | POA: Diagnosis present

## 2015-07-01 DIAGNOSIS — K8689 Other specified diseases of pancreas: Secondary | ICD-10-CM

## 2015-07-01 DIAGNOSIS — Z79899 Other long term (current) drug therapy: Secondary | ICD-10-CM

## 2015-07-01 DIAGNOSIS — K219 Gastro-esophageal reflux disease without esophagitis: Secondary | ICD-10-CM | POA: Diagnosis present

## 2015-07-01 DIAGNOSIS — Z8673 Personal history of transient ischemic attack (TIA), and cerebral infarction without residual deficits: Secondary | ICD-10-CM | POA: Diagnosis not present

## 2015-07-01 DIAGNOSIS — R1013 Epigastric pain: Secondary | ICD-10-CM

## 2015-07-01 DIAGNOSIS — F17210 Nicotine dependence, cigarettes, uncomplicated: Secondary | ICD-10-CM | POA: Diagnosis present

## 2015-07-01 DIAGNOSIS — E876 Hypokalemia: Secondary | ICD-10-CM | POA: Diagnosis present

## 2015-07-01 DIAGNOSIS — M542 Cervicalgia: Secondary | ICD-10-CM | POA: Diagnosis present

## 2015-07-01 DIAGNOSIS — K2920 Alcoholic gastritis without bleeding: Secondary | ICD-10-CM | POA: Diagnosis present

## 2015-07-01 HISTORY — DX: Cervicalgia: M54.2

## 2015-07-01 HISTORY — DX: Transient cerebral ischemic attack, unspecified: G45.9

## 2015-07-01 HISTORY — DX: Acute embolism and thrombosis of unspecified vein: I82.90

## 2015-07-01 HISTORY — DX: Chronic obstructive pulmonary disease, unspecified: J44.9

## 2015-07-01 HISTORY — DX: Acute pancreatitis without necrosis or infection, unspecified: K85.90

## 2015-07-01 HISTORY — DX: Tobacco use: Z72.0

## 2015-07-01 HISTORY — DX: Gastro-esophageal reflux disease without esophagitis: K21.9

## 2015-07-01 LAB — LIPID PANEL
CHOL/HDL RATIO: 2.4 ratio
Cholesterol: 175 mg/dL (ref 0–200)
HDL: 72 mg/dL (ref >40)
LDL CALC: 78 mg/dL (ref 0–99)
TRIGLYCERIDES: 125 mg/dL (ref <150)
VLDL: 25 mg/dL (ref 0–40)

## 2015-07-01 LAB — COMPREHENSIVE METABOLIC PANEL
ALBUMIN: 4 g/dL (ref 3.5–5.0)
ALK PHOS: 110 U/L (ref 38–126)
ALT: 19 U/L (ref 17–63)
AST: 22 U/L (ref 15–41)
Anion gap: 11 (ref 5–15)
BILIRUBIN TOTAL: 1.4 mg/dL — AB (ref 0.3–1.2)
BUN: 7 mg/dL (ref 6–20)
CALCIUM: 9.3 mg/dL (ref 8.9–10.3)
CO2: 25 mmol/L (ref 22–32)
CREATININE: 0.76 mg/dL (ref 0.61–1.24)
Chloride: 106 mmol/L (ref 101–111)
GFR calc Af Amer: >60 mL/min (ref >60)
Glucose, Bld: 119 mg/dL — ABNORMAL HIGH (ref 65–99)
Potassium: 3 mmol/L — ABNORMAL LOW (ref 3.5–5.1)
Sodium: 142 mmol/L (ref 135–145)
TOTAL PROTEIN: 7.4 g/dL (ref 6.5–8.1)

## 2015-07-01 LAB — CBC WITH DIFFERENTIAL/PLATELET
BASOS ABS: 0.1 10*3/uL (ref 0–0.1)
BASOS PCT: 1 %
Eosinophils Absolute: 0.1 10*3/uL (ref 0–0.7)
Eosinophils Relative: 2 %
HCT: 43.1 % (ref 40.0–52.0)
HEMOGLOBIN: 14.9 g/dL (ref 13.0–18.0)
Lymphocytes Relative: 23 %
Lymphs Abs: 1.5 10*3/uL (ref 1.0–3.6)
MCH: 31.5 pg (ref 26.0–34.0)
MCHC: 34.5 g/dL (ref 32.0–36.0)
MCV: 91.3 fL (ref 80.0–100.0)
MONO ABS: 0.8 10*3/uL (ref 0.2–1.0)
Monocytes Relative: 12 %
NEUTROS ABS: 4.1 10*3/uL (ref 1.4–6.5)
NEUTROS PCT: 62 %
Platelets: 262 10*3/uL (ref 150–440)
RBC: 4.72 MIL/uL (ref 4.40–5.90)
RDW: 15.3 % — ABNORMAL HIGH (ref 11.5–14.5)
WBC: 6.6 10*3/uL (ref 3.8–10.6)

## 2015-07-01 LAB — LIPASE, BLOOD: LIPASE: 44 U/L (ref 11–51)

## 2015-07-01 MED ORDER — IOHEXOL 240 MG/ML SOLN
25.0000 mL | Freq: Once | INTRAMUSCULAR | Status: AC | PRN
  Administered 2015-07-01: 25 mL via ORAL

## 2015-07-01 MED ORDER — SODIUM CHLORIDE 0.9 % IV SOLN
1.0000 g | Freq: Three times a day (TID) | INTRAVENOUS | Status: DC
  Administered 2015-07-01 – 2015-07-03 (×7): 1 g via INTRAVENOUS
  Filled 2015-07-01 (×9): qty 1

## 2015-07-01 MED ORDER — PANTOPRAZOLE SODIUM 40 MG PO TBEC
40.0000 mg | DELAYED_RELEASE_TABLET | Freq: Every day | ORAL | Status: DC
  Administered 2015-07-01 – 2015-07-02 (×2): 40 mg via ORAL
  Filled 2015-07-01 (×2): qty 1

## 2015-07-01 MED ORDER — ACETAMINOPHEN 325 MG PO TABS: 650.0000 mg | ORAL_TABLET | Freq: Four times a day (QID) | ORAL | Status: DC | PRN

## 2015-07-01 MED ORDER — DOLUTEGRAVIR SODIUM 50 MG PO TABS
50.0000 mg | ORAL_TABLET | Freq: Every day | ORAL | Status: DC
  Administered 2015-07-01 – 2015-07-05 (×5): 50 mg via ORAL
  Filled 2015-07-01 (×5): qty 1

## 2015-07-01 MED ORDER — POTASSIUM CHLORIDE 10 MEQ/100ML IV SOLN
10.0000 meq | INTRAVENOUS | Status: AC
Start: 2015-07-01 — End: 2015-07-03
  Administered 2015-07-01 – 2015-07-03 (×5): 10 meq via INTRAVENOUS
  Filled 2015-07-01 (×4): qty 100

## 2015-07-01 MED ORDER — TRIAMCINOLONE ACETONIDE 0.5 % EX CREA
1.0000 "application " | TOPICAL_CREAM | Freq: Two times a day (BID) | CUTANEOUS | Status: DC
  Administered 2015-07-03: 1 via TOPICAL
  Filled 2015-07-01: qty 15

## 2015-07-01 MED ORDER — ONDANSETRON HCL 4 MG/2ML IJ SOLN
INTRAMUSCULAR | Status: AC
  Administered 2015-07-01: 4 mg via INTRAVENOUS
  Filled 2015-07-01: qty 2

## 2015-07-01 MED ORDER — HYDROMORPHONE HCL 1 MG/ML IJ SOLN
2.0000 mg | INTRAMUSCULAR | Status: AC
  Administered 2015-07-01: 2 mg via INTRAVENOUS
  Filled 2015-07-01: qty 2

## 2015-07-01 MED ORDER — BENZONATATE 100 MG PO CAPS: 100.0000 mg | ORAL_CAPSULE | Freq: Four times a day (QID) | ORAL | Status: DC | PRN

## 2015-07-01 MED ORDER — MORPHINE SULFATE (PF) 4 MG/ML IV SOLN
4.0000 mg | Freq: Once | INTRAVENOUS | Status: AC
  Administered 2015-07-01: 4 mg via INTRAVENOUS

## 2015-07-01 MED ORDER — LABETALOL HCL 5 MG/ML IV SOLN
10.0000 mg | INTRAVENOUS | Status: AC
  Administered 2015-07-01: 10 mg via INTRAVENOUS
  Filled 2015-07-01: qty 4

## 2015-07-01 MED ORDER — HYDROMORPHONE HCL 1 MG/ML IJ SOLN
2.0000 mg | INTRAMUSCULAR | Status: DC | PRN
  Administered 2015-07-01 – 2015-07-05 (×13): 2 mg via INTRAVENOUS
  Filled 2015-07-01 (×13): qty 2

## 2015-07-01 MED ORDER — HYDRALAZINE HCL 20 MG/ML IJ SOLN
10.0000 mg | Freq: Four times a day (QID) | INTRAMUSCULAR | Status: DC | PRN
  Administered 2015-07-01: 10 mg via INTRAVENOUS
  Filled 2015-07-01: qty 1

## 2015-07-01 MED ORDER — DICYCLOMINE HCL 20 MG PO TABS: 20.0000 mg | ORAL_TABLET | Freq: Three times a day (TID) | ORAL | Status: DC | PRN

## 2015-07-01 MED ORDER — RITONAVIR 100 MG PO TABS
100.0000 mg | ORAL_TABLET | Freq: Every day | ORAL | Status: DC
  Administered 2015-07-01: 100 mg via ORAL
  Filled 2015-07-01 (×2): qty 1

## 2015-07-01 MED ORDER — ONDANSETRON HCL 4 MG PO TABS: 4.0000 mg | ORAL_TABLET | Freq: Four times a day (QID) | ORAL | Status: DC | PRN

## 2015-07-01 MED ORDER — ERTAPENEM SODIUM 1 G IJ SOLR: 1.0000 g | INTRAMUSCULAR | Status: DC

## 2015-07-01 MED ORDER — ACETAMINOPHEN 650 MG RE SUPP: 650.0000 mg | Freq: Four times a day (QID) | RECTAL | Status: DC | PRN

## 2015-07-01 MED ORDER — HYDROMORPHONE HCL 1 MG/ML IJ SOLN
1.0000 mg | Freq: Once | INTRAMUSCULAR | Status: AC
  Administered 2015-07-01: 1 mg via INTRAVENOUS
  Filled 2015-07-01: qty 1

## 2015-07-01 MED ORDER — OXYCODONE HCL 5 MG PO TABS
5.0000 mg | ORAL_TABLET | ORAL | Status: DC | PRN
  Administered 2015-07-05: 09:00:00 5 mg via ORAL
  Filled 2015-07-01: qty 1

## 2015-07-01 MED ORDER — IOHEXOL 300 MG/ML  SOLN
100.0000 mL | Freq: Once | INTRAMUSCULAR | Status: AC | PRN
  Administered 2015-07-01: 100 mL via INTRAVENOUS

## 2015-07-01 MED ORDER — ONDANSETRON HCL 4 MG/2ML IJ SOLN
4.0000 mg | Freq: Once | INTRAMUSCULAR | Status: AC
  Administered 2015-07-01: 4 mg via INTRAVENOUS

## 2015-07-01 MED ORDER — SODIUM CHLORIDE 0.9 % IV SOLN
INTRAVENOUS | Status: DC
  Administered 2015-07-01 – 2015-07-04 (×5): via INTRAVENOUS

## 2015-07-01 MED ORDER — PROMETHAZINE HCL 25 MG/ML IJ SOLN
12.5000 mg | Freq: Once | INTRAMUSCULAR | Status: AC
  Administered 2015-07-01: 12.5 mg via INTRAVENOUS

## 2015-07-01 MED ORDER — ENALAPRIL MALEATE 5 MG PO TABS
20.0000 mg | ORAL_TABLET | Freq: Every day | ORAL | Status: DC
  Administered 2015-07-02 – 2015-07-05 (×4): 20 mg via ORAL
  Filled 2015-07-01 (×4): qty 4

## 2015-07-01 MED ORDER — SODIUM CHLORIDE 0.9 % IV BOLUS (SEPSIS)
1000.0000 mL | Freq: Once | INTRAVENOUS | Status: AC
  Administered 2015-07-01: 1000 mL via INTRAVENOUS

## 2015-07-01 MED ORDER — ONDANSETRON HCL 4 MG/2ML IJ SOLN
4.0000 mg | Freq: Four times a day (QID) | INTRAMUSCULAR | Status: DC | PRN
  Administered 2015-07-01 – 2015-07-02 (×3): 4 mg via INTRAVENOUS
  Filled 2015-07-01 (×3): qty 2

## 2015-07-01 MED ORDER — CLONIDINE HCL 0.1 MG PO TABS
0.1000 mg | ORAL_TABLET | ORAL | Status: AC
  Administered 2015-07-01: 0.1 mg via ORAL
  Filled 2015-07-01: qty 1

## 2015-07-01 MED ORDER — LORATADINE 10 MG PO TABS
10.0000 mg | ORAL_TABLET | Freq: Every day | ORAL | Status: DC
  Administered 2015-07-01 – 2015-07-05 (×5): 10 mg via ORAL
  Filled 2015-07-01 (×5): qty 1

## 2015-07-01 MED ORDER — ENOXAPARIN SODIUM 40 MG/0.4ML ~~LOC~~ SOLN
40.0000 mg | SUBCUTANEOUS | Status: DC
  Administered 2015-07-01: 40 mg via SUBCUTANEOUS
  Filled 2015-07-01: qty 0.4

## 2015-07-01 MED ORDER — PROMETHAZINE HCL 25 MG/ML IJ SOLN
INTRAMUSCULAR | Status: AC
  Administered 2015-07-01: 12.5 mg via INTRAVENOUS
  Filled 2015-07-01: qty 1

## 2015-07-01 MED ORDER — SODIUM CHLORIDE 0.9 % IV SOLN
8.0000 mg | INTRAVENOUS | Status: AC
  Administered 2015-07-01: 8 mg via INTRAVENOUS
  Filled 2015-07-01: qty 4

## 2015-07-01 MED ORDER — DARUNAVIR ETHANOLATE 800 MG PO TABS
800.0000 mg | ORAL_TABLET | Freq: Every day | ORAL | Status: DC
  Administered 2015-07-01: 23:00:00 800 mg via ORAL
  Filled 2015-07-01 (×2): qty 1

## 2015-07-01 MED ORDER — MORPHINE SULFATE (PF) 4 MG/ML IV SOLN
INTRAVENOUS | Status: AC
  Administered 2015-07-01: 4 mg via INTRAVENOUS
  Filled 2015-07-01: qty 1

## 2015-07-01 MED ORDER — PANCRELIPASE (LIP-PROT-AMYL) 12000-38000 UNITS PO CPEP
24000.0000 [IU] | ORAL_CAPSULE | Freq: Three times a day (TID) | ORAL | Status: DC
Start: 2015-07-02 — End: 2015-07-05
  Administered 2015-07-03 – 2015-07-05 (×8): 24000 [IU] via ORAL
  Filled 2015-07-01 (×9): qty 2

## 2015-07-01 NOTE — ED Notes (Addendum)
Was unable to take his bp meds today. Did take 2 of his roxidone one at 4am and one at 11am

## 2015-07-01 NOTE — ED Provider Notes (Signed)
[facility] Emergency Department Provider Note  ____________________________________________  Time seen: Approximately 12:25 PM  I have reviewed the triage vital signs and the nursing notes.   HISTORY  Chief Complaint Abdominal Pain    HPI Mason Martin is a 49 y.o. 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 Patient reports that he drank alcohol yesterday and  his symptoms began today. They feel similar to his prior bouts of pancreatitis. He has also had many episodes of nonbloody nonbilious emesis. No chest pain or difficulty breathing. No diarrhea, fevers or chills.   Past Medical History  Diagnosis Date  . Pancreas cyst   . HIV disease (HCC)   . Hypertension     There are no active problems to display for this patient.   No past surgical history on file.  Current Outpatient Rx  Name  Route  Sig  Dispense  Refill  . benzonatate (TESSALON PERLES) 100 MG capsule   Oral   Take 1 capsule (100 mg total) by mouth every 6 (six) hours as needed for cough.   30 capsule   0   . dicyclomine (BENTYL) 20 MG tablet   Oral   Take 1 tablet (20 mg total) by mouth 3 (three) times daily as needed for spasms.   30 tablet   0   . oxyCODONE (ROXICODONE) 5 MG immediate release tablet   Oral   Take 1 tablet (5 mg total) by mouth every 8 (eight) hours as needed.   15 tablet   0     Allergies Sulfur  No family history on file.  Social History Social History  Substance Use Topics  . Smoking status: Current Every Day Smoker -- 0.50 packs/day  . Smokeless tobacco: None  . Alcohol Use: Yes     Comment: 3 beers last night    Review of Systems Constitutional: No fever/chills Eyes: No visual changes. ENT: No sore throat. Cardiovascular: Denies chest  pain. Respiratory: Denies shortness of breath. Gastrointestinal: + abdominal pain.  + nausea, + vomiting.  No diarrhea.  No constipation. Genitourinary: Negative for dysuria. Musculoskeletal: Negative for back pain. Skin: Negative for rash. Neurological: Negative for headaches, focal weakness or numbness.  10-point ROS otherwise negative.  ____________________________________________   PHYSICAL EXAM:  VITAL SIGNS: ED Triage Vitals  Enc Vitals Group     BP 07/01/15 1220 160/108 mmHg     Pulse Rate 07/01/15 1220 81     Resp 07/01/15 1220 20     Temp --      Temp src --      SpO2 07/01/15 1220 100 %     Weight 07/01/15 1220 130 lb (58.968 kg)     Height 07/01/15 1220  (1.626 m)     Head Cir --      Peak Flow --      Pain Score 07/01/15 1221 10     Pain Loc --      Pain Edu? --      Excl. in GC? --     Constitutional: Alert and oriented. Appears diaphoretic and in distress due to pain. Eyes: Conjunctivae are normal. PERRL. EOMI. Head: Atraumatic. Nose: No congestion/rhinnorhea. Mouth/Throat: Mucous membranes are moist.  Oropharynx non-erythematous. Neck: No stridor.   Cardiovascular: Normal rate, regular rhythm. Grossly normal heart sounds.  Good peripheral circulation. Respiratory:  Normal respiratory effort.  No retractions. Lungs CTAB. Gastrointestinal: Soft with severe tenderness to palpation in the right upper quadrant and the epigastrium. No CVA tenderness. Genitourinary: deferred Musculoskeletal: No lower extremity tenderness nor edema.  No joint effusions. Neurologic:  Normal speech and language. No gross focal neurologic deficits are appreciated. Skin:  Skin is warm, dry and intact. No rash noted. Psychiatric: Mood and affect are normal. Speech and behavior are normal.  ____________________________________________   LABS (all labs ordered are listed, but only abnormal results are displayed)  Labs Reviewed  CBC WITH DIFFERENTIAL/PLATELET - Abnormal;  Notable for the following:    RDW 15.3 (*)    All other components within normal limits  COMPREHENSIVE METABOLIC PANEL - Abnormal; Notable for the following:    Potassium 3.0 (*)    Glucose, Bld 119 (*)    Total Bilirubin 1.4 (*)    All other components within normal limits  LIPASE, BLOOD  URINALYSIS COMPLETEWITH MICROSCOPIC ([facility] ONLY)   ____________________________________________  EKG  ED ECG REPORT I, Gayla Doss, the attending physician, personally viewed and interpreted this ECG.   Date: 07/01/2015  EKG Time: 12:27  Rate: 65  Rhythm: normal EKG, normal sinus rhythm  Axis: normal  Intervals:none  ST&T Change: No acute ST elevation  ____________________________________________  RADIOLOGY  CT abdomen and pelvis IMPRESSION: 1. There is a mass-like area of hypoenhancement in the inferior aspect of the pancreatic head and uncinate process, as discussed above. While this could simply represent an area of focal pancreatitis, there is relatively little inflammatory changes around it. If this is in fact pancreatitis, the hypoenhancement would be concerning for developing pancreatic necrosis. Alternatively, this may in fact represent a primary pancreatic neoplasm. Correlation for signs and symptoms of pancreatitis is recommended, and consideration for further evaluation with endoscopic ultrasound is suggested if clinically appropriate. 2. Normal appendix. 3. Atherosclerosis. 4. Additional incidental findings, as above.  ____________________________________________   PROCEDURES  Procedure(s) performed: None  Critical Care performed: No  ____________________________________________   INITIAL IMPRESSION / ASSESSMENT AND PLAN / ED COURSE  Pertinent labs & imaging results that were available during my care of the patient were reviewed by me and considered in my medical decision making (see chart for details).  Mason Martin is a 49 y.o. 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 On exam, he is diaphoretic and appears to be in pain. He has significant tenderness to palpation in the epigastrium and right upper quadrant. Findings may be secondary to recurrent alcoholic pancreatitis versus gastritis however the patient had CT findings in July showing focal pancreatitis as well as normal anatomy increasing his risk for SMV's thrombosis. His lipase is normal so we'll obtain CT abdomen and pelvis to evaluate for evidence of pseudocyst or evidence of SMV thrombosis which could be causing his pain. Normal CBC. CMP is notable for mild hypokalemia. The fluids and treat his pain. Reassess for disposition.  ----------------------------------------- 4:04 PM on 07/01/2015 ----------------------------------------- CT scan shows pancreatitis which could be concerning for developing pancreatic necrosis. Neoplasm is also listed on the differential however given acute onset of pain, recent alcohol use yesterday, suspect pancreatitis is more likely at this time. Patient continues to have poorly controlled pain despite morphine and Dilaudid. Case discussed with the hospitalist, Dr. Mack Hook, for admission at this time.  ____________________________________________  FINAL CLINICAL IMPRESSION(S) / ED DIAGNOSES  Final diagnoses:  Pain  Acute pancreatitis, unspecified pancreatitis type  Epigastric pain      Gayla DossEryka A Particia Strahm, MD 07/01/15 1635

## 2015-07-01 NOTE — ED Notes (Signed)
[facility] is outside time parameters to accept pt, will transfer pt at 1920\

## 2015-07-01 NOTE — ED Notes (Signed)
Floor called, pt OTW, Annice PihJackie, RN to receive

## 2015-07-01 NOTE — ED Notes (Signed)
Patient transported to CT 

## 2015-07-01 NOTE — H&P (Signed)
[facility] - [place] at [facility]   PATIENT NAME: Mason Martin    MR#:  [id number]  DATE OF BIRTH:  May 21, 1966  DATE OF ADMISSION:  07/01/2015  PRIMARY CARE PHYSICIAN: [facility]  REQUESTING/REFERRING PHYSICIAN: Dr. Chari Manning  CHIEF COMPLAINT:   Chief Complaint  Patient presents with  . Abdominal Pain    n/v - hx of pancreatitis    HISTORY OF PRESENT ILLNESS:  Mason Martin  is a 49 y.o. male with a known history of alcohol use, chronic pancreatitis, HIV on HAART, COPD not on home oxygen, tobacco use disorder, hypertension presents to the hospital secondary to sudden onset of abdominal pain associated with nausea and vomiting that started last night.  patient says he drinks about 2-3 beers occasionally. Has had chronic pancreatitis in the past but no recent episodes. He said he was feeling great yesterday morning, went to work came home and went to bed last night. He woke up around midnight with intense periumbilical abdominal pain with radiation to the back. He was also having significant nausea and vomiting. He has some diarrhea occasionally from his chronic pancreatitis. No fevers but has chills. No cough congestion, has some chest pain more likely heartburn sensation. CT of the abdomen showing acute pancreatitis with inflammation around pancreatic head, possible necrosis cannot be ruled out. Lipase is within normal limits especially with his chronic pancreatitis. He is being admitted for acute on chronic pancreatitis with possible necrotizing pancreatitis.  PAST MEDICAL HISTORY:   Past Medical History  Diagnosis Date  . Pancreas cyst   . HIV disease (HCC)     on HAART, follows with ID at [facility]  . Hypertension   . Pancreatitis     chronic pancreatitis  . Tobacco use   . COPD (chronic obstructive pulmonary disease) (HCC)   . TIA (transient ischemic attack)   . GERD (gastroesophageal reflux disease)   . Clot     in intra abdominal vessels- off coumadin now   . Neck pain     with left arm radiculopathy    PAST SURGICAL HISTORY:   Past Surgical History  Procedure Laterality Date  . Esophageal atresia repair      SOCIAL HISTORY:   Social History  Substance Use Topics  . Smoking status: Current Every Day Smoker -- 0.50 packs/day  . Smokeless tobacco: Not on file  . Alcohol Use: 0.0 oz/week    0 Standard drinks or equivalent per week     Comment: 3 beers last night    FAMILY HISTORY:   Family History  Problem Relation Age of Onset  . Heart failure Father   . Emphysema Father     DRUG ALLERGIES:   Allergies  Allergen Reactions  . Sulfur Hives    REVIEW OF SYSTEMS:   Review of Systems  Constitutional: Positive for chills and malaise/fatigue. Negative for fever and weight loss.  HENT: Negative for ear discharge, ear pain, hearing loss, nosebleeds and tinnitus.   Eyes: Negative for blurred vision, double vision and photophobia.  Respiratory: Negative for cough, hemoptysis, shortness of breath and wheezing.   Cardiovascular: Positive for chest pain. Negative for palpitations, orthopnea and leg swelling.  Gastrointestinal: Positive for heartburn, nausea, vomiting, abdominal pain and diarrhea. Negative for constipation and melena.  Genitourinary: Negative for dysuria, urgency, frequency and hematuria.  Musculoskeletal: Positive for myalgias and neck pain. Negative for back pain.  Skin: Negative for rash.  Neurological: Negative for dizziness, tingling, tremors, sensory change, speech change, focal weakness  and headaches.  Endo/Heme/Allergies: Does not bruise/bleed easily.  Psychiatric/Behavioral: Negative for depression.    MEDICATIONS AT HOME:   Prior to Admission medications   Medication Sig Start Date End Date Taking? Authorizing Provider  benzonatate (TESSALON PERLES) 100 MG capsule Take 1 capsule (100 mg total) by mouth every 6 (six) hours as needed for cough. 04/11/15 04/10/16  Phineas Semen, MD  dicyclomine  (BENTYL) 20 MG tablet Take 1 tablet (20 mg total) by mouth 3 (three) times daily as needed for spasms. 04/11/15 04/10/16  Phineas Semen, MD  oxyCODONE (ROXICODONE) 5 MG immediate release tablet Take 1 tablet (5 mg total) by mouth every 8 (eight) hours as needed. 02/09/15 02/09/16  Minna Antis, MD      VITAL SIGNS:  Blood pressure 176/99, pulse 70, resp. rate 19, height  (1.626 m), weight 58.968 kg (130 lb), SpO2 97 %.  PHYSICAL EXAMINATION:   Physical Exam  GENERAL:  49 y.o.-year-old thin patient lying in the bed with some acute distress due to abdominal pain.  EYES: Pupils equal, round, reactive to light and accommodation. No scleral icterus. Extraocular muscles intact.  HEENT: Head atraumatic, normocephalic. Oropharynx and nasopharynx clear.  NECK:  Supple, no jugular venous distention. No thyroid enlargement, no tenderness.  LUNGS: Normal breath sounds bilaterally, no wheezing, rales,rhonchi or crepitation. No use of accessory muscles of respiration.  CARDIOVASCULAR: S1, S2 normal. No murmurs, rubs, or gallops.  ABDOMEN: Soft, tender in the periumbilical region with voluntary guarding, no rigidity or rebound tenderness, nondistended. Bowel sounds present. No organomegaly or mass.  EXTREMITIES: No pedal edema, cyanosis, or clubbing.  NEUROLOGIC: Cranial nerves II through XII are intact. Muscle strength 5/5 in all extremities. Sensation intact. Gait not checked.  PSYCHIATRIC: The patient is alert and oriented x 3.  SKIN: No obvious rash, lesion, or ulcer.   LABORATORY PANEL:   CBC  Recent Labs Lab 07/01/15 1231  WBC 6.6  HGB 14.9  HCT 43.1  PLT 262   ------------------------------------------------------------------------------------------------------------------  Chemistries   Recent Labs Lab 07/01/15 1231  NA 142  K 3.0*  CL 106  CO2 25  GLUCOSE 119*  BUN 7  CREATININE 0.76  CALCIUM 9.3  AST 22  ALT 19  ALKPHOS 110  BILITOT 1.4*    ------------------------------------------------------------------------------------------------------------------  Cardiac Enzymes No results for input(s): TROPONINI in the last 168 hours. ------------------------------------------------------------------------------------------------------------------  RADIOLOGY:  Ct Abdomen Pelvis W Contrast  07/01/2015  CLINICAL DATA:  49 year old male with epigastric and periumbilical pain for the past day. Nausea and vomiting. Prior history of pancreatitis. EXAM: CT ABDOMEN AND PELVIS WITH CONTRAST TECHNIQUE: Multidetector CT imaging of the abdomen and pelvis was performed using the standard protocol following bolus administration of intravenous contrast. CONTRAST:  OMNIPAQUE IOHEXOL 300 MG/ML SOLN, 25mL OMNIPAQUE IOHEXOL 240 MG/ML SOLN COMPARISON:  CT the abdomen and pelvis 01/25/2011. FINDINGS: Lower chest:  Unremarkable. Hepatobiliary: Sub cm low-attenuation lesion in segment 4A of the liver is too small to definitively characterize, but is statistically likely a tiny cyst. No intra or extrahepatic biliary ductal dilatation gallbladder is unremarkable in appearance. Pancreas: In the inferior aspect of the pancreatic head and uncinate process there is a mass-like area of hypoenhancement which measures approximately 3.0 x 4.4 x 4.3 cm (image 30 of series 2 and image 55 of coronal series 5). This mass-like area appears to arise within the pancreas, but exerts mass effect upon the adjacent second, third and fourth portions of the duodenum which appear draped around this area. It is uncertain  whether or not this is truly a mass, or simply an area of hypoenhancement (i.e., an area of pancreatic necrosis in the setting of acute pancreatitis). However, there is relatively little surrounding inflammatory changes in the adjacent peripancreatic fat. Remaining portions of the superior aspect of the pancreatic head, body and tail are otherwise normal in appearance. No  pancreatic ductal dilatation. Notably, this area in the inferior aspect of the pancreatic head is well separated from the superior mesenteric vein and artery, splenic vein, portal vein and hepatic artery. Spleen: Well-defined 1.7 x 1.4 cm low-attenuation lesion in the lateral aspect of the inferior spleen, incompletely characterized, but favored to be benign likely a cyst or lymphangioma. Adrenals/Urinary Tract: Bilateral adrenal glands and bilateral kidneys are normal in appearance. No hydroureteronephrosis. Urinary bladder is normal in appearance. Stomach/Bowel: Normal appearance of the stomach. No pathologic dilatation of small bowel or colon. Normal appendix. Vascular/Lymphatic: Atherosclerosis throughout the abdominal and pelvic vasculature. Vascular findings relative to the possible mass in the pancreatic head, as discussed above. No lymphadenopathy noted in the abdomen or pelvis. Calcified portacaval lymph node incidentally noted. Reproductive: Prostate gland and seminal vesicles are unremarkable in appearance. Other: No significant volume of ascites.  No pneumoperitoneum. Musculoskeletal: There are no aggressive appearing lytic or blastic lesions noted in the visualized portions of the skeleton. IMPRESSION: 1. There is a mass-like area of hypoenhancement in the inferior aspect of the pancreatic head and uncinate process, as discussed above. While this could simply represent an area of focal pancreatitis, there is relatively little inflammatory changes around it. If this is in fact pancreatitis, the hypoenhancement would be concerning for developing pancreatic necrosis. Alternatively, this may in fact represent a primary pancreatic neoplasm. Correlation for signs and symptoms of pancreatitis is recommended, and consideration for further evaluation with endoscopic ultrasound is suggested if clinically appropriate. 2. Normal appendix. 3. Atherosclerosis. 4. Additional incidental findings, as above.  Electronically Signed   By: Trudie Reedaniel  Entrikin M.D.   On: 07/01/2015 15:35    EKG:   Orders placed or performed during the hospital encounter of 07/01/15  . ED EKG  . ED EKG  . EKG 12-Lead  . EKG 12-Lead    IMPRESSION AND PLAN:   Mason Martin  is a 49 y.o. male with a known history of alcohol use, chronic pancreatitis, HIV on HAART, COPD not on home oxygen, tobacco use disorder, hypertension presents to the hospital secondary to sudden onset of abdominal pain associated with nausea and vomiting that started last night.   #1 acute on chronic pancreatitis- CT of the abdomen confirming acute pancreatitis. Lipase is not elevated likely from patient's history of chronic pancreatitis. -IV fluids, keep nothing by mouth. -With possible necrotizing pancreatitis, will add IV antibiotics with Invanz. -GI consult. -Follow clinically. -IV fluids and pain medications and nausea medicines. -Likely alcoholic pancreatitis. However check lipid panel to rule out hypertriglyceridemia  #2 hypokalemia-being replaced  #3 tobacco use disorder-counseled against smoking for 3 minutes. Patient refused a nicotine patch at this time.  #4 alcohol use-drinks only 2-3 beers occasionally. Refuses any prior withdrawal symptoms. We'll continue to monitor.  #5 malignant hypertension- verify home meds - IV hydralazine prn  #6 COPD- stable, not on o2  #7 HIV-home meds need to be verified and restart on his HAART therapy.  #8 DVT prophylaxis-started on Lovenox  #9 GERD-on Protonix    All the records are reviewed and case discussed with ED provider. Management plans discussed with the patient, family and they are in  agreement.  CODE STATUS: Full Code  TOTAL TIME TAKING CARE OF THIS PATIENT: 50 minutes.    Enid Baas M.D on 07/01/2015 at 4:22 PM  Between 7am to 6pm - Pager - [phone]  After 6pm go to [email or web address] - password [email or web address]  [facility]  Office   [phone]  CC: Primary care physician; Pcp Not In System

## 2015-07-01 NOTE — ED Notes (Signed)
Pt drank a few beers last night then began with abd pain - last vomited 45 mins ago.

## 2015-07-01 NOTE — ED Notes (Signed)
Attempted to call report, told we need to get BP down before they can accept the pt

## 2015-07-02 LAB — URINALYSIS COMPLETE WITH MICROSCOPIC (ARMC ONLY)
BACTERIA UA: NONE SEEN
Bilirubin Urine: NEGATIVE
GLUCOSE, UA: NEGATIVE mg/dL
Hgb urine dipstick: NEGATIVE
Leukocytes, UA: NEGATIVE
Nitrite: NEGATIVE
Protein, ur: 30 mg/dL — AB
pH: 5 (ref 5.0–8.0)

## 2015-07-02 LAB — CBC
HCT: 38.9 % — ABNORMAL LOW (ref 40.0–52.0)
Hemoglobin: 13.2 g/dL (ref 13.0–18.0)
MCH: 31.1 pg (ref 26.0–34.0)
MCHC: 33.9 g/dL (ref 32.0–36.0)
MCV: 91.9 fL (ref 80.0–100.0)
PLATELETS: 234 10*3/uL (ref 150–440)
RBC: 4.23 MIL/uL — ABNORMAL LOW (ref 4.40–5.90)
RDW: 15.1 % — AB (ref 11.5–14.5)
WBC: 9.7 10*3/uL (ref 3.8–10.6)

## 2015-07-02 LAB — BASIC METABOLIC PANEL
Anion gap: 10 (ref 5–15)
BUN: 7 mg/dL (ref 6–20)
CHLORIDE: 104 mmol/L (ref 101–111)
CO2: 28 mmol/L (ref 22–32)
CREATININE: 0.67 mg/dL (ref 0.61–1.24)
Calcium: 8.7 mg/dL — ABNORMAL LOW (ref 8.9–10.3)
GFR calc Af Amer: >60 mL/min (ref >60)
GFR calc non Af Amer: >60 mL/min (ref >60)
Glucose, Bld: 122 mg/dL — ABNORMAL HIGH (ref 65–99)
Potassium: 3 mmol/L — ABNORMAL LOW (ref 3.5–5.1)
SODIUM: 142 mmol/L (ref 135–145)

## 2015-07-02 LAB — LIPASE, BLOOD: Lipase: 46 U/L (ref 11–51)

## 2015-07-02 MED ORDER — RITONAVIR 100 MG PO TABS
100.0000 mg | ORAL_TABLET | Freq: Every day | ORAL | Status: DC
  Filled 2015-07-02: qty 1

## 2015-07-02 MED ORDER — DARUNAVIR ETHANOLATE 800 MG PO TABS
800.0000 mg | ORAL_TABLET | Freq: Every day | ORAL | Status: DC
  Administered 2015-07-02 – 2015-07-05 (×4): 800 mg via ORAL
  Filled 2015-07-02 (×4): qty 1

## 2015-07-02 MED ORDER — PANTOPRAZOLE SODIUM 40 MG IV SOLR
40.0000 mg | Freq: Two times a day (BID) | INTRAVENOUS | Status: DC
  Administered 2015-07-02 – 2015-07-05 (×6): 40 mg via INTRAVENOUS
  Filled 2015-07-02 (×7): qty 40

## 2015-07-02 MED ORDER — RITONAVIR 100 MG PO TABS
100.0000 mg | ORAL_TABLET | Freq: Every day | ORAL | Status: DC
  Administered 2015-07-02 – 2015-07-05 (×4): 100 mg via ORAL
  Filled 2015-07-02 (×5): qty 1

## 2015-07-02 NOTE — Plan of Care (Signed)
Problem: Safety: Goal: Ability to remain free from injury will improve Outcome: Progressing Pt is moderate falls, call bell and phone within reach, pt remains free of injury this shift  Problem: Pain Managment: Goal: General experience of comfort will improve Outcome: Progressing Pt has c/o pain in his abdomen, prn meds given with improvement, pt resting comfortably in bed  Problem: Skin Integrity: Goal: Risk for impaired skin integrity will decrease Outcome: Progressing VSS, IVF infusing

## 2015-07-02 NOTE — Progress Notes (Signed)
Pt reported to this nurse that he feels like he need to urinate but can't.  Bladder scan is 429 ml.  Dr. Luberta MutterKonidena made aware and gave order for in and out cath.  However pt refuses and was able to urinate 100 ml amber colored urine.  Dr. Luberta MutterKonidena made aware and is ok with not doing an in and out cath.  Will continue to monitor output.  Orson Apeanielle Tawni Melkonian, RN

## 2015-07-02 NOTE — Plan of Care (Signed)
Problem: Pain Managment: Goal: General experience of comfort will improve Outcome: Progressing Complained of pain, dilaudid IV given with improvement. Zofran given for nausea with improvement

## 2015-07-02 NOTE — Progress Notes (Signed)
GI Inpatient Consult Note  Reason for Consult:  Chronic pancreatitis, coffee ground emesis   Attending Requesting Consult: Konidena  History of Present Illness: Mason Martin is a 50 y.o. male with PMHx HIV, chronic panc, COPD p/w abd pain, n/v.  Reports several days of abd pain, progressive.  Does have extensive pancreatic hx.  Had EUS at [facility] 08/2014 with diffuse panc changes.   Had CT 12/2014 at [facility] showing changes in uncinate process but not severe as changes on current CT.  Currently denies weight loss, dysphagia, rectal bleeding, melena.   In ED had CT showing hypodense area in uncinate concerning for mass vs necrotizing panc.  Lipase was normal in ED.  Upon reaching the floor, had episode of emesis of coffee ground like material.  He denies any prev hematemesis or coffee ground emesis. He is unsure when last MRI of pancreas was but thinks did have MR pancreas at some point.    Has extensive hx of colonic adenomas, getting frequent colonoscopies at [facility].   Past Medical History:  Past Medical History  Diagnosis Date  . Pancreas cyst   . HIV disease (HCC)     on HAART, follows with ID at [facility]  . Hypertension   . Pancreatitis     chronic pancreatitis  . Tobacco use   . COPD (chronic obstructive pulmonary disease) (HCC)   . TIA (transient ischemic attack)   . GERD (gastroesophageal reflux disease)   . Clot     in intra abdominal vessels- off coumadin now  . Neck pain     with left arm radiculopathy    Problem List: Patient Active Problem List   Diagnosis Date Noted  . Acute pancreatitis 07/01/2015    Past Surgical History: Past Surgical History  Procedure Laterality Date  . Esophageal atresia repair      Allergies: Allergies  Allergen Reactions  . Sulfur Hives    Home Medications: Prescriptions prior to admission  Medication Sig Dispense Refill Last Dose  . cetirizine (ZYRTEC) 10 MG tablet Take 10 mg by mouth daily.  9 06/30/2015 at Unknown time  . CREON 24000  units CPEP Take 24,000 Units by mouth 3 (three) times daily with meals.  5 06/30/2015 at Unknown time  . enalapril (VASOTEC) 20 MG tablet Take 20 mg by mouth daily.  8 06/30/2015 at Unknown time  . ketoconazole (NIZORAL) 2 % cream Apply 1 application topically 2 (two) times daily.  11 06/30/2015 at Unknown time  . NEXIUM 40 MG capsule Take 40 mg by mouth daily before breakfast.  11 06/30/2015 at Unknown time  . NORVIR 100 MG TABS tablet Take 100 mg by mouth daily.  11 06/30/2015 at Unknown time  . PREZISTA 800 MG tablet Take 800 mg by mouth daily with breakfast.  11 06/30/2015 at Unknown time  . TIVICAY 50 MG tablet Take 50 mg by mouth daily.  11 06/30/2015 at Unknown time  . triamcinolone cream (KENALOG) 0.5 % Apply 1 application topically 2 (two) times daily.  2 06/30/2015 at Unknown time  . VENTOLIN HFA 108 (90 Base) MCG/ACT inhaler Inhale 2 puffs into the lungs 4 (four) times daily as needed. For shortness of breath and/or wheezing.  5 06/30/2015 at Unknown time  . benzonatate (TESSALON PERLES) 100 MG capsule Take 1 capsule (100 mg total) by mouth every 6 (six) hours as needed for cough. 30 capsule 0   . oxyCODONE (ROXICODONE) 5 MG immediate release tablet Take 1 tablet (5 mg total) by  mouth every 8 (eight) hours as needed. 15 tablet 0    Home medication reconciliation was completed with the patient.   Scheduled Inpatient Medications:   . Darunavir Ethanolate  800 mg Oral Q breakfast  . dolutegravir  50 mg Oral Daily  . enalapril  20 mg Oral Daily  . lipase/protease/amylase  24,000 Units Oral TID WC  . loratadine  10 mg Oral Daily  . meropenem (MERREM) IV  1 g Intravenous 3 times per day  . pantoprazole (PROTONIX) IV  40 mg Intravenous Q12H  . ritonavir  100 mg Oral Q breakfast  . triamcinolone cream  1 application Topical BID    Continuous Inpatient Infusions:   . sodium chloride 100 mL/hr at 07/02/15 1750    PRN Inpatient Medications:  acetaminophen **OR** acetaminophen,  benzonatate, dicyclomine, hydrALAZINE, HYDROmorphone (DILAUDID) injection, ondansetron **OR** ondansetron (ZOFRAN) IV, oxyCODONE  Family History: family history includes Emphysema in his father; Heart failure in his father.  The patient's family history is negative for inflammatory bowel disorders, GI malignancy, or solid organ transplantation.  Social History:   reports that he has been smoking.  He does not have any smokeless tobacco history on file. He reports that he drinks alcohol. He reports that he does not use illicit drugs.  Review of Systems: Constitutional: Weight is stable.  Eyes: No changes in vision. ENT: No oral lesions, sore throat.  GI: see HPI.  Heme/Lymph: No easy bruising.  CV: No chest pain.  GU: No hematuria.  Integumentary: No rashes.  Neuro: No headaches.  Psych: No depression/anxiety.  Endocrine: No heat/cold intolerance.  Allergic/Immunologic: No urticaria.  Resp: No cough, SOB.  Musculoskeletal: No joint swelling.    Physical Examination: BP 161/94 mmHg  Pulse 90  Temp(Src) 98.8 F (37.1 C) (Oral)  Resp 18  Ht 5\' 4"  (1.626 m)  Wt 57.879 kg (127 lb 9.6 oz)  BMI 21.89 kg/m2  SpO2 98% Gen: NAD, alert and oriented x 4 HEENT: PEERLA, EOMI, Neck: supple, no JVD or thyromegaly Chest: CTA bilaterally, no wheezes, crackles, or other adventitious sounds CV: RRR, no m/g/c/r Abd: + epi TTP,  ND, +BS in all four quadrants; no HSM, guarding, ridigity, or rebound tenderness Ext: no edema, well perfused with 2+ pulses Skin: no rash or lesions noted Lymph: no LAD  Data: Lab Results  Component Value Date   WBC 9.7 07/02/2015   HGB 13.2 07/02/2015   HCT 38.9* 07/02/2015   MCV 91.9 07/02/2015   PLT 234 07/02/2015    Recent Labs Lab 07/01/15 1231 07/02/15 0438  HGB 14.9 13.2   Lab Results  Component Value Date   NA 142 07/02/2015   K 3.0* 07/02/2015   CL 104 07/02/2015   CO2 28 07/02/2015   BUN 7 07/02/2015   CREATININE 0.67 07/02/2015   Lab  Results  Component Value Date   ALT 19 07/01/2015   AST 22 07/01/2015   ALKPHOS 110 07/01/2015   BILITOT 1.4* 07/01/2015   No results for input(s): APTT, INR, PTT in the last 168 hours.   Assessment/Plan: Mr. Vivanco is a 50 y.o. male with chronic pancreatitis, p/w abd pain, n/v and CT showing hypodense lesion vs necr panc in uncinate process.  Normal lipase makes acute pancreatitis less likely. EUS 08/2014 at [facility] makes panc mass less likely.  Also had one coffee ground emesis in hospital, with no change in Hgb or hemodnamics and no melena in setting of ETOH use. 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  EUS 08/2014 with no  varices, no portal HTN gastropathy.   Recommendations: - MRI pancreas - possible EGD if further coffee ground emesis or drop in Hgb - protonix 40 BID - ETOH cessation.  - will follow  Thank you for the consult. Please call with questions or concerns.  REIN, Addison NaegeliMATTHEW GORDON, MD

## 2015-07-02 NOTE — Progress Notes (Signed)
GI Note:  Full note to follow.   Lipase normal so Ct findings not c/w acute pancreatitis.  Likely chronic changes.  [facility] only diffuse panc panrechymal changes. [facility] with inflammation and cystic changes uncinate process.  This latest CT here seems worse.     Recs: - MRI pancreas.  - Will hold on EGD unless further coffe grounds or drop in Hgb, esophagus and stomach unremrakable on EUS 08/2014.

## 2015-07-02 NOTE — Progress Notes (Signed)
Pt had 2 episodes of coffee ground appearing emesis.  Dr. Luberta MutterKonidena made aware.  Orson Apeanielle Criag Wicklund, RN

## 2015-07-02 NOTE — Progress Notes (Addendum)
Pt started on clear liquid diet per Dr. Shelle Ironein and pt can wait until Monday for MR of the abdomen.  Orson Apeanielle Edin Kon, RN

## 2015-07-02 NOTE — Progress Notes (Signed)
[facility] [place] [facility]   PATIENT NAME: Mason PainRodney Martin    MR#:  [id number]  DATE OF BIRTH:  08/04/1965  SUBJECTIVE: 50 year old male patient admitted for acute abdominal Martin and also found to have acute pancreatitis. Has abdominal Martin, had 2 episodes of coffee-ground vomiting the today. No diarrhea. No fever. History of alcohol abuse present.   CHIEF COMPLAINT:   Chief Complaint  Patient presents with  . Abdominal Martin    n/v - hx of pancreatitis    REVIEW OF SYSTEMS:   Review of Systems  Constitutional: Negative for fever and chills.  HENT: Negative for hearing loss.   Eyes: Negative for blurred vision, double vision and photophobia.  Respiratory: Negative for cough, hemoptysis and shortness of breath.   Cardiovascular: Positive for chest Martin. Negative for palpitations, orthopnea and leg swelling.  Gastrointestinal: Positive for heartburn, nausea, vomiting, abdominal Martin and diarrhea.  Genitourinary: Negative for dysuria and urgency.  Musculoskeletal: Negative for myalgias and neck Martin.  Skin: Negative for rash.  Neurological: Negative for dizziness, focal weakness, seizures, weakness and headaches.  Psychiatric/Behavioral: Negative for memory loss. The patient does not have insomnia.      DRUG ALLERGIES:   Allergies  Allergen Reactions  . Sulfur Hives    VITALS:  Blood pressure 142/94, pulse 86, temperature 97.9 F (36.6 C), temperature source Oral, resp. rate 18, height 5\' 4"  (1.626 m), weight 57.879 kg (127 lb 9.6 oz), SpO2 98 %.  PHYSICAL EXAMINATION:  GENERAL:  50 y.o.-year-old patient lying in the bed with no acute distress.  EYES: Pupils equal, round, reactive to light and accommodation. No scleral icterus. Extraocular muscles intact.  HEENT: Head atraumatic, normocephalic. Oropharynx and nasopharynx clear.  NECK:  Supple, no jugular venous distention. No thyroid enlargement, no tenderness.  LUNGS: Normal breath sounds  bilaterally, no wheezing, rales,rhonchi or crepitation. No use of accessory muscles of respiration.  CARDIOVASCULAR: S1, S2 normal. No murmurs, rubs, or gallops.  ABDOMEN: Slight epigastric tenderness present ,no rebound tenderness. bowel sounds present. no organomegaly.  EXTREMITIES: No pedal edema, cyanosis, or clubbing.  NEUROLOGIC: Cranial nerves II through XII are intact. Muscle strength 5/5 in all extremities. Sensation intact. Gait not checked.  PSYCHIATRIC: The patient is alert and oriented x 3.  SKIN: No obvious rash, lesion, or ulcer.    LABORATORY PANEL:   CBC  Recent Labs Lab 07/02/15 0438  WBC 9.7  HGB 13.2  HCT 38.9*  PLT 234   ------------------------------------------------------------------------------------------------------------------  Chemistries   Recent Labs Lab 07/01/15 1231 07/02/15 0438  NA 142 142  K 3.0* 3.0*  CL 106 104  CO2 25 28  GLUCOSE 119* 122*  BUN 7 7  CREATININE 0.76 0.67  CALCIUM 9.3 8.7*  AST 22  --   ALT 19  --   ALKPHOS 110  --   BILITOT 1.4*  --    ------------------------------------------------------------------------------------------------------------------  Cardiac Enzymes No results for input(s): TROPONINI in the last 168 hours. ------------------------------------------------------------------------------------------------------------------  RADIOLOGY:  Ct Abdomen Pelvis W Contrast  07/01/2015  CLINICAL DATA:  50 year old male with epigastric and periumbilical Martin for the past day. Nausea and vomiting. Prior history of pancreatitis. EXAM: CT ABDOMEN AND PELVIS WITH CONTRAST TECHNIQUE: Multidetector CT imaging of the abdomen and pelvis was performed using the standard protocol following bolus administration of intravenous contrast. CONTRAST:  100mL OMNIPAQUE IOHEXOL 300 MG/ML SOLN, 25mL OMNIPAQUE IOHEXOL 240 MG/ML SOLN COMPARISON:  CT the abdomen and pelvis 01/25/2011. FINDINGS: Lower chest:  Unremarkable.  Hepatobiliary:  Sub cm low-attenuation lesion in segment 4A of the liver is too small to definitively characterize, but is statistically likely a tiny cyst. No intra or extrahepatic biliary ductal dilatation gallbladder is unremarkable in appearance. Pancreas: In the inferior aspect of the pancreatic head and uncinate process there is a mass-like area of hypoenhancement which measures approximately 3.0 x 4.4 x 4.3 cm (image 30 of series 2 and image 55 of coronal series 5). This mass-like area appears to arise within the pancreas, but exerts mass effect upon the adjacent second, third and fourth portions of the duodenum which appear draped around this area. It is uncertain whether or not this is truly a mass, or simply an area of hypoenhancement (i.e., an area of pancreatic necrosis in the setting of acute pancreatitis). However, there is relatively little surrounding inflammatory changes in the adjacent peripancreatic fat. Remaining portions of the superior aspect of the pancreatic head, body and tail are otherwise normal in appearance. No pancreatic ductal dilatation. Notably, this area in the inferior aspect of the pancreatic head is well separated from the superior mesenteric vein and artery, splenic vein, portal vein and hepatic artery. Spleen: Well-defined 1.7 x 1.4 cm low-attenuation lesion in the lateral aspect of the inferior spleen, incompletely characterized, but favored to be benign likely a cyst or lymphangioma. Adrenals/Urinary Tract: Bilateral adrenal glands and bilateral kidneys are normal in appearance. No hydroureteronephrosis. Urinary bladder is normal in appearance. Stomach/Bowel: Normal appearance of the stomach. No pathologic dilatation of small bowel or colon. Normal appendix. Vascular/Lymphatic: Atherosclerosis throughout the abdominal and pelvic vasculature. Vascular findings relative to the possible mass in the pancreatic head, as discussed above. No lymphadenopathy noted in the abdomen or  pelvis. Calcified portacaval lymph node incidentally noted. Reproductive: Prostate gland and seminal vesicles are unremarkable in appearance. Other: No significant volume of ascites.  No pneumoperitoneum. Musculoskeletal: There are no aggressive appearing lytic or blastic lesions noted in the visualized portions of the skeleton. IMPRESSION: 1. There is a mass-like area of hypoenhancement in the inferior aspect of the pancreatic head and uncinate process, as discussed above. While this could simply represent an area of focal pancreatitis, there is relatively little inflammatory changes around it. If this is in fact pancreatitis, the hypoenhancement would be concerning for developing pancreatic necrosis. Alternatively, this may in fact represent a primary pancreatic neoplasm. Correlation for signs and symptoms of pancreatitis is recommended, and consideration for further evaluation with endoscopic ultrasound is suggested if clinically appropriate. 2. Normal appendix. 3. Atherosclerosis. 4. Additional incidental findings, as above. Electronically Signed   By: Trudie Reed M.D.   On: 07/01/2015 15:35    EKG:   Orders placed or performed during the hospital encounter of 07/01/15  . ED EKG  . ED EKG  . EKG 12-Lead  . EKG 12-Lead    ASSESSMENT AND PLAN:   #1 abdominal Martin secondary to acute pancreatitis, concern for necrotizing pancreatitis on the CAT scan. Patient doesn't not look  Sick or unstable at this time,. Patient is right now on IV meropenem continue that. Continue IV fluids. IV Martin medications. #2. Coffee  Ground  vomiting likely secondary to acute   Alcoholic gastritis; continue IV PPIs, IV fluids. Hold Lovenox. Check CBC every 8 hours. Patient right now stable hemodynamically stable.appreciate GI follow-up. 3 .history of HIV: Continue antiretrovirals. 4.chronic pancreatitis: Patient takes Creon supplements. #5 hypertension ;uncontrolled. Likely due to Martin. Control the Martin, continue BP  medication. 6.COPD stable no wheezing. 7. hypokalemia replace the potassium. #8. history  of neck Martin with cervical neuralgia;, supposed to have a surgery for the neck the same . All the records are reviewed and case discussed with Care Management/Social Workerr. Management plans discussed with the patient, family and they are in agreement.  CODE STATUS:full  TOTAL TIME TAKING CARE OF THIS PATIENT: 35 minutes.   POSSIBLE D/C IN 2-3  DAYS, DEPENDING ON CLINICAL CONDITION.   Katha Hamming M.D on 07/02/2015 at 11:30 AM  Between 7am to 6pm - Pager - [phone]  After 6pm go to [email or web address] - password [email or web address]  [facility]  Office  [phone]  CC: Primary care physician; Pcp Not In System   Note: This dictation was prepared with Dragon dictation along with smaller phrase technology. Any transcriptional errors that result from this process are unintentional.

## 2015-07-03 LAB — BASIC METABOLIC PANEL
Anion gap: 8 (ref 5–15)
CALCIUM: 7.9 mg/dL — AB (ref 8.9–10.3)
CO2: 28 mmol/L (ref 22–32)
Chloride: 103 mmol/L (ref 101–111)
Creatinine, Ser: 0.59 mg/dL — ABNORMAL LOW (ref 0.61–1.24)
GFR calc Af Amer: >60 mL/min (ref >60)
GLUCOSE: 91 mg/dL (ref 65–99)
POTASSIUM: 2.7 mmol/L — AB (ref 3.5–5.1)
SODIUM: 139 mmol/L (ref 135–145)

## 2015-07-03 LAB — CBC
HCT: 36.5 % — ABNORMAL LOW (ref 40.0–52.0)
HEMATOCRIT: 33.9 % — AB (ref 40.0–52.0)
HEMOGLOBIN: 12.3 g/dL — AB (ref 13.0–18.0)
Hemoglobin: 11.5 g/dL — ABNORMAL LOW (ref 13.0–18.0)
MCH: 31.8 pg (ref 26.0–34.0)
MCH: 31.2 pg (ref 26.0–34.0)
MCHC: 34 g/dL (ref 32.0–36.0)
MCHC: 33.6 g/dL (ref 32.0–36.0)
MCV: 93.5 fL (ref 80.0–100.0)
MCV: 92.6 fL (ref 80.0–100.0)
PLATELETS: 187 10*3/uL (ref 150–440)
Platelets: 206 10*3/uL (ref 150–440)
RBC: 3.63 MIL/uL — ABNORMAL LOW (ref 4.40–5.90)
RBC: 3.94 MIL/uL — AB (ref 4.40–5.90)
RDW: 15.2 % — AB (ref 11.5–14.5)
RDW: 14.9 % — ABNORMAL HIGH (ref 11.5–14.5)
WBC: 6.3 10*3/uL (ref 3.8–10.6)
WBC: 6.3 10*3/uL (ref 3.8–10.6)

## 2015-07-03 MED ORDER — POTASSIUM CHLORIDE 10 MEQ/100ML IV SOLN
10.0000 meq | INTRAVENOUS | Status: AC
  Administered 2015-07-03 (×4): 10 meq via INTRAVENOUS
  Filled 2015-07-03 (×4): qty 100

## 2015-07-03 MED ORDER — BOOST / RESOURCE BREEZE PO LIQD
1.0000 | Freq: Three times a day (TID) | ORAL | Status: DC
  Administered 2015-07-03 – 2015-07-04 (×4): 1 via ORAL

## 2015-07-03 MED ORDER — POTASSIUM CHLORIDE CRYS ER 20 MEQ PO TBCR
40.0000 meq | EXTENDED_RELEASE_TABLET | Freq: Once | ORAL | Status: AC
  Administered 2015-07-03: 07:00:00 40 meq via ORAL
  Filled 2015-07-03: qty 2

## 2015-07-03 NOTE — Plan of Care (Signed)
Problem: Safety: Goal: Ability to remain free from injury will improve Outcome: Progressing Pt independent in ambulation.

## 2015-07-03 NOTE — Progress Notes (Signed)
[facility] - [place] at [facility]   PATIENT NAME: Mason Martin    MR#:  [id number]  DATE OF BIRTH:  09-Aug-1965  SUBJECTIVE: 50 year old male patient admitted for acute abdominal pain and also found to have acute pancreatitis. has some abdominal pain but better than before. No further coffee ground vomiting.   CHIEF COMPLAINT:   Chief Complaint  Patient presents with  . Abdominal Pain    n/v - hx of pancreatitis    REVIEW OF SYSTEMS:   Review of Systems  Constitutional: Negative for fever and chills.  HENT: Negative for hearing loss.   Eyes: Negative for blurred vision, double vision and photophobia.  Respiratory: Negative for cough, hemoptysis and shortness of breath.   Cardiovascular: Negative for chest pain, palpitations, orthopnea and leg swelling.  Gastrointestinal: Positive for abdominal pain. Negative for nausea, vomiting and diarrhea.  Genitourinary: Negative for dysuria and urgency.  Musculoskeletal: Negative for myalgias and neck pain.  Skin: Negative for rash.  Neurological: Negative for dizziness, focal weakness, seizures, weakness and headaches.  Psychiatric/Behavioral: Negative for memory loss. The patient does not have insomnia.      DRUG ALLERGIES:   Allergies  Allergen Reactions  . Sulfur Hives    VITALS:  Blood pressure 122/77, pulse 92, temperature 97.9 F (36.6 C), temperature source Oral, resp. rate 18, height 5\' 4"  (1.626 m), weight 57.879 kg (127 lb 9.6 oz), SpO2 99 %.  PHYSICAL EXAMINATION:  GENERAL:  50 y.o.-year-old patient lying in the bed with no acute distress.  EYES: Pupils equal, round, reactive to light and accommodation. No scleral icterus. Extraocular muscles intact.  HEENT: Head atraumatic, normocephalic. Oropharynx and nasopharynx clear.  NECK:  Supple, no jugular venous distention. No thyroid enlargement, no tenderness.  LUNGS: Normal breath sounds bilaterally, no wheezing, rales,rhonchi or crepitation. No  use of accessory muscles of respiration.  CARDIOVASCULAR: S1, S2 normal. No murmurs, rubs, or gallops.  ABDOMEN: Slight epigastric tenderness present ,no rebound tenderness. bowel sounds present. no organomegaly.  EXTREMITIES: No pedal edema, cyanosis, or clubbing.  NEUROLOGIC: Cranial nerves II through XII are intact. Muscle strength 5/5 in all extremities. Sensation intact. Gait not checked.  PSYCHIATRIC: The patient is alert and oriented x 3.  SKIN: No obvious rash, lesion, or ulcer.    LABORATORY PANEL:   CBC  Recent Labs Lab 07/03/15 0422  WBC 6.3  HGB 11.5*  HCT 33.9*  PLT 187   ------------------------------------------------------------------------------------------------------------------  Chemistries   Recent Labs Lab 07/01/15 1231  07/03/15 0422  NA 142  < > 139  K 3.0*  < > 2.7*  CL 106  < > 103  CO2 25  < > 28  GLUCOSE 119*  < > 91  BUN 7  < > <5*  CREATININE 0.76  < > 0.59*  CALCIUM 9.3  < > 7.9*  AST 22  --   --   ALT 19  --   --   ALKPHOS 110  --   --   BILITOT 1.4*  --   --   < > = values in this interval not displayed. ------------------------------------------------------------------------------------------------------------------  Cardiac Enzymes No results for input(s): TROPONINI in the last 168 hours. ------------------------------------------------------------------------------------------------------------------  RADIOLOGY:  Ct Abdomen Pelvis W Contrast  07/01/2015  CLINICAL DATA:  50 year old male with epigastric and periumbilical pain for the past day. Nausea and vomiting. Prior history of pancreatitis. EXAM: CT ABDOMEN AND PELVIS WITH CONTRAST TECHNIQUE: Multidetector CT imaging of the abdomen and pelvis was performed using the  standard protocol following bolus administration of intravenous contrast. CONTRAST:  OMNIPAQUE IOHEXOL 300 MG/ML SOLN, 25mL OMNIPAQUE IOHEXOL 240 MG/ML SOLN COMPARISON:  CT the abdomen and pelvis 01/25/2011.  FINDINGS: Lower chest:  Unremarkable. Hepatobiliary: Sub cm low-attenuation lesion in segment 4A of the liver is too small to definitively characterize, but is statistically likely a tiny cyst. No intra or extrahepatic biliary ductal dilatation gallbladder is unremarkable in appearance. Pancreas: In the inferior aspect of the pancreatic head and uncinate process there is a mass-like area of hypoenhancement which measures approximately 3.0 x 4.4 x 4.3 cm (image 30 of series 2 and image 55 of coronal series 5). This mass-like area appears to arise within the pancreas, but exerts mass effect upon the adjacent second, third and fourth portions of the duodenum which appear draped around this area. It is uncertain whether or not this is truly a mass, or simply an area of hypoenhancement (i.e., an area of pancreatic necrosis in the setting of acute pancreatitis). However, there is relatively little surrounding inflammatory changes in the adjacent peripancreatic fat. Remaining portions of the superior aspect of the pancreatic head, body and tail are otherwise normal in appearance. No pancreatic ductal dilatation. Notably, this area in the inferior aspect of the pancreatic head is well separated from the superior mesenteric vein and artery, splenic vein, portal vein and hepatic artery. Spleen: Well-defined 1.7 x 1.4 cm low-attenuation lesion in the lateral aspect of the inferior spleen, incompletely characterized, but favored to be benign likely a cyst or lymphangioma. Adrenals/Urinary Tract: Bilateral adrenal glands and bilateral kidneys are normal in appearance. No hydroureteronephrosis. Urinary bladder is normal in appearance. Stomach/Bowel: Normal appearance of the stomach. No pathologic dilatation of small bowel or colon. Normal appendix. Vascular/Lymphatic: Atherosclerosis throughout the abdominal and pelvic vasculature. Vascular findings relative to the possible mass in the pancreatic head, as discussed above. No  lymphadenopathy noted in the abdomen or pelvis. Calcified portacaval lymph node incidentally noted. Reproductive: Prostate gland and seminal vesicles are unremarkable in appearance. Other: No significant volume of ascites.  No pneumoperitoneum. Musculoskeletal: There are no aggressive appearing lytic or blastic lesions noted in the visualized portions of the skeleton. IMPRESSION: 1. There is a mass-like area of hypoenhancement in the inferior aspect of the pancreatic head and uncinate process, as discussed above. While this could simply represent an area of focal pancreatitis, there is relatively little inflammatory changes around it. If this is in fact pancreatitis, the hypoenhancement would be concerning for developing pancreatic necrosis. Alternatively, this may in fact represent a primary pancreatic neoplasm. Correlation for signs and symptoms of pancreatitis is recommended, and consideration for further evaluation with endoscopic ultrasound is suggested if clinically appropriate. 2. Normal appendix. 3. Atherosclerosis. 4. Additional incidental findings, as above. Electronically Signed   By: Trudie Reed M.D.   On: 07/01/2015 15:35    EKG:   Orders placed or performed during the hospital encounter of 07/01/15  . ED EKG  . ED EKG  . EKG 12-Lead  . EKG 12-Lead    ASSESSMENT AND PLAN:   #1 abdominal pain secondary to acute pancreatitis, concern for necrotizing pancreatitis on the CAT scan. Patient doesn't not look  Sick or unstable at this time,. Patient is right now on IV meropenem continue that. Continue IV fluids. IV pain medications. MRI of abdomen today. Seen by gastroenterology. #2. Coffee  Ground  vomiting likely secondary to acute   Alcoholic gastritis; continue IV PPIs, IV fluids. Hold Lovenox. Check CBC every 8 hours. Patient  right now stable hemodynamically stable.appreciate GI follow-up.[facility] which showed only diffuse panc panrechymal changes . No further indication for  EGD. 3 .history of HIV: Continue antiretrovirals. 4.chronic pancreatitis: Patient takes Creon supplements. #5 hypertension ;uncontrolled. Likely due to pain. Control the pain, continue BP medication. 6.COPD stable no wheezing. 7. Severe hypokalemia likely secondary to vomiting was yesterday. Replace the potassium and recheck the potassium.. #8. history of neck pain with cervical neuralgia;, supposed to have a surgery for the neck the same . All the records are reviewed and case discussed with Care Management/Social Workerr. Management plans discussed with the patient, family and they are in agreement.  CODE STATUS:full  TOTAL TIME TAKING CARE OF THIS PATIENT: 35 minutes.   POSSIBLE D/C IN 2-3  DAYS, DEPENDING ON CLINICAL CONDITION.   Katha HammingKONIDENA,Marcee Jacobs M.D on 07/03/2015 at 11:43 AM  Between 7am to 6pm - Pager - [phone]  After 6pm go to [email or web address] - password [email or web address]  [facility]  Office  [phone]  CC: Primary care physician; Pcp Not In System   Note: This dictation was prepared with Dragon dictation along with smaller phrase technology. Any transcriptional errors that result from this process are unintentional.

## 2015-07-03 NOTE — Plan of Care (Signed)
Problem: Pain Managment: Goal: General experience of comfort will improve Outcome: Progressing Pt c/o pain in abd.  Unchanged with food or after BM. Pain meds given as ordered/requested.  Problem: Skin Integrity: Goal: Risk for impaired skin integrity will decrease Outcome: Progressing Pt has no obvious skin problem.

## 2015-07-03 NOTE — Plan of Care (Signed)
Problem: Safety: Goal: Ability to remain free from injury will improve Outcome: Progressing No injury this shift  Problem: Pain Managment: Goal: General experience of comfort will improve Outcome: Progressing Pain med given x1 with improvement     

## 2015-07-03 NOTE — Care Management Important Message (Signed)
Important Message  Patient Details  Name: Mason Martin MRN: [id number] Date of Birth: 02/17/1966   Medicare Important Message Given:  Yes    Olegario MessierKathy A Yohan Samons 07/03/2015, 11:33 AM

## 2015-07-03 NOTE — Progress Notes (Signed)
Initial Nutrition Assessment   INTERVENTION:   Coordination of Care: await diet progression as medically able Medical Food Supplement Therapy: will recommend Boost Breeze po TID, each supplement provides 250 kcal and 9 grams of protein   NUTRITION DIAGNOSIS:   Inadequate oral intake related to acute illness as evidenced by  (NPO/CL diet order).  GOAL:   Patient will meet greater than or equal to 90% of their needs  MONITOR:    (Energy Intake, Electrolyte and renal Profile, Gastrointestinal Profile, Anthropoemtrics)  REASON FOR ASSESSMENT:   Diagnosis    ASSESSMENT:   Pt admitted with abdominal pain secondary to acute pancreatitis. Pt with h/o HIV.  Past Medical History  Diagnosis Date  . Pancreas cyst   . HIV disease (HCC)     on HAART, [facility]  . Hypertension   . Pancreatitis     chronic pancreatitis  . Tobacco use   . COPD (chronic obstructive pulmonary disease) (HCC)   . TIA (transient ischemic attack)   . GERD (gastroesophageal reflux disease)   . Clot     in intra abdominal vessels- off coumadin now  . Neck pain     with left arm radiculopathy     Diet Order:  Diet clear liquid Room service appropriate?: Yes; Fluid consistency:: Thin    Current Nutrition: Pt reports tolerating CL tray very well today eating 50-100% of trays.  Food/Nutrition-Related History: Pt reports good appetite PTA usually eating 3 meals per day however not much for breakfast sometimes just an Ensure. Pt reports usually drinking 2-3 Ensures per day sometimes 4.    Scheduled Medications:  . Darunavir Ethanolate  800 mg Oral Q breakfast  . dolutegravir  50 mg Oral Daily  . enalapril  20 mg Oral Daily  . feeding supplement  1 Container Oral TID BM  . lipase/protease/amylase  24,000 Units Oral TID WC  . loratadine  10 mg Oral Daily  . meropenem (MERREM) IV  1 g Intravenous 3 times per day  . pantoprazole (PROTONIX) IV  40 mg Intravenous Q12H  . ritonavir  100 mg  Oral Q breakfast  . triamcinolone cream  1 application Topical BID    Continuous Medications:  . sodium chloride 100 mL/hr at 07/03/15 0404     Electrolyte/Renal Profile and Glucose Profile:   Recent Labs Lab 07/01/15 1231 07/02/15 0438 07/03/15 0422  NA 142 142 139  K 3.0* 3.0* 2.7*  CL 106 104 103  CO2 25 28 28   BUN 7 7 <5*  CREATININE 0.76 0.67 0.59*  CALCIUM 9.3 8.7* 7.9*  GLUCOSE 119* 122* 91   Protein Profile:  Recent Labs Lab 07/01/15 1231  ALBUMIN 4.0    Gastrointestinal Profile: Last BM:  07/01/2015   Nutrition-Focused Physical Exam Findings: Nutrition-Focused physical exam completed. Findings are WDL for fat depletion, muscle depletion, and edema.    Weight Change: Pt reports he gained around 10lbs back in November 2016 to 135lbs. Current weight 127lbs. Per [facility] weight loss 6% in 2 months.     Height:   Ht Readings from Last 1 Encounters:  07/01/15 5\' 4"  (1.626 m)    Weight:   Wt Readings from Last 1 Encounters:  07/01/15 127 lb 9.6 oz (57.879 kg)   Wt Readings from Last 10 Encounters:  07/01/15 127 lb 9.6 oz (57.879 kg)  04/11/15 135 lb (61.236 kg)  02/09/15 125 lb (56.7 kg)     BMI:  Body mass index is 21.89 kg/(m^2).  Estimated  Nutritional Needs:   Kcal:  BEE: 1345kcals, TEE: (IF 1.1-1.3)(AF 1.2) 1775-2098kcals  Protein:  58-70g protein (1.0-1.2g/kg)  Fluid:  1740-2015mL of fluid (30-48mL/kg)  EDUCATION NEEDS:   No education needs identified at this time   MODERATE Care Level  Leda Quail, RD, LDN Pager [phone] Weekend/On-Call Pager [phone]

## 2015-07-03 NOTE — Progress Notes (Signed)
GI Inpatient Follow-up Note  Patient Identification: Mason Martin is a 50 y.o. male with chronic panc, panc lesion and coffee ground emesis x 1  Subjective:  Denies abd pain today. No coffee ground emesis since pres.  No stool since pres.  No n/v.  No f/c.   Scheduled Inpatient Medications:  . Darunavir Ethanolate  800 mg Oral Q breakfast  . dolutegravir  50 mg Oral Daily  . enalapril  20 mg Oral Daily  . feeding supplement  1 Container Oral TID BM  . lipase/protease/amylase  24,000 Units Oral TID WC  . loratadine  10 mg Oral Daily  . meropenem (MERREM) IV  1 g Intravenous 3 times per day  . pantoprazole (PROTONIX) IV  40 mg Intravenous Q12H  . ritonavir  100 mg Oral Q breakfast  . triamcinolone cream  1 application Topical BID    Continuous Inpatient Infusions:   . sodium chloride 50 mL/hr at 07/03/15 1744    PRN Inpatient Medications:  acetaminophen **OR** acetaminophen, benzonatate, dicyclomine, hydrALAZINE, HYDROmorphone (DILAUDID) injection, ondansetron **OR** ondansetron (ZOFRAN) IV, oxyCODONE  Review of Systems: Constitutional: Weight is stable.  Eyes: No changes in vision. ENT: No oral lesions, sore throat.  GI: see HPI.  Heme/Lymph: No easy bruising.  CV: No chest pain.  GU: No hematuria.  Integumentary: No rashes.  Neuro: No headaches.  Psych: No depression/anxiety.  Endocrine: No heat/cold intolerance.  Allergic/Immunologic: No urticaria.  Resp: No cough, SOB.  Musculoskeletal: No joint swelling.    Physical Examination: BP 148/90 mmHg  Pulse 88  Temp(Src) 98.4 F (36.9 C) (Oral)  Resp 20  Ht 5\' 4"  (1.626 m)  Wt 57.879 kg (127 lb 9.6 oz)  BMI 21.89 kg/m2  SpO2 100% Gen: NAD, alert and oriented x 4 HEENT: PEERLA, EOMI, Neck: supple, no JVD or thyromegaly Chest: CTA bilaterally, no wheezes, crackles, or other adventitious sounds CV: RRR, no m/g/c/r Abd: +mild diffuse TTP, ND, +BS in all four quadrants; no HSM, guarding, ridigity, or rebound  tenderness Ext: no edema, well perfused with 2+ pulses, Skin: no rash or lesions noted Lymph: no LAD  Data: Lab Results  Component Value Date   WBC 6.3 07/03/2015   HGB 12.3* 07/03/2015   HCT 36.5* 07/03/2015   MCV 92.6 07/03/2015   PLT 206 07/03/2015    Recent Labs Lab 07/02/15 0438 07/03/15 0422 07/03/15 1817  HGB 13.2 11.5* 12.3*   Lab Results  Component Value Date   NA 139 07/03/2015   K 2.7* 07/03/2015   CL 103 07/03/2015   CO2 28 07/03/2015   BUN <5* 07/03/2015   CREATININE 0.59* 07/03/2015   Lab Results  Component Value Date   ALT 19 07/01/2015   AST 22 07/01/2015   ALKPHOS 110 07/01/2015   BILITOT 1.4* 07/01/2015   No results for input(s): APTT, INR, PTT in the last 168 hours.   Assessment/Plan: [name] is a 50 y.o. male with chronic panc, hypodense lesion in uncinate,  cofee ground emesis x 1  Recommendations: - stop meropenem, this is not necrotizing pancreatiits.  - awaiting MRI pancreas - EGD if further coffee ground emesis or drop in Hgb - protonix 40 BID.   Please call with questions or concerns.  Elvina Bosch, Addison NaegeliMATTHEW GORDON, MD

## 2015-07-04 ENCOUNTER — Inpatient Hospital Stay: Payer: Medicare Other

## 2015-07-04 LAB — BASIC METABOLIC PANEL
ANION GAP: 4 — AB (ref 5–15)
BUN: <5 mg/dL — ABNORMAL LOW (ref 6–20)
CALCIUM: 8.2 mg/dL — AB (ref 8.9–10.3)
CHLORIDE: 105 mmol/L (ref 101–111)
CO2: 29 mmol/L (ref 22–32)
CREATININE: 0.71 mg/dL (ref 0.61–1.24)
GFR calc Af Amer: >60 mL/min (ref >60)
GFR calc non Af Amer: >60 mL/min (ref >60)
GLUCOSE: 102 mg/dL — AB (ref 65–99)
Potassium: 3 mmol/L — ABNORMAL LOW (ref 3.5–5.1)
Sodium: 138 mmol/L (ref 135–145)

## 2015-07-04 LAB — C DIFFICILE QUICK SCREEN W PCR REFLEX
C DIFFICILE (CDIFF) INTERP: NEGATIVE
C DIFFICILE (CDIFF) TOXIN: NEGATIVE
C Diff antigen: NEGATIVE

## 2015-07-04 LAB — MAGNESIUM: Magnesium: 1.4 mg/dL — ABNORMAL LOW (ref 1.7–2.4)

## 2015-07-04 MED ORDER — MAGNESIUM SULFATE IN D5W 10-5 MG/ML-% IV SOLN
1.0000 g | Freq: Once | INTRAVENOUS | Status: AC
  Administered 2015-07-04: 1 g via INTRAVENOUS
  Filled 2015-07-04: qty 100

## 2015-07-04 MED ORDER — POTASSIUM CHLORIDE CRYS ER 20 MEQ PO TBCR
20.0000 meq | EXTENDED_RELEASE_TABLET | ORAL | Status: AC
  Administered 2015-07-04 (×2): 20 meq via ORAL
  Filled 2015-07-04 (×2): qty 1

## 2015-07-04 MED ORDER — GADOBENATE DIMEGLUMINE 529 MG/ML IV SOLN
15.0000 mL | Freq: Once | INTRAVENOUS | Status: AC | PRN
  Administered 2015-07-04: 11 mL via INTRAVENOUS

## 2015-07-04 NOTE — Progress Notes (Signed)
GI Inpatient Follow-up Note  Patient Identification: Mason Martin is a 50 y.o. male with chronic panc, panc pseduocysts, coffee ground emesis  Subjective:  Ongoign abd pain, unchanged.  Tolerating PO. Reports two very dark stools last night, one lighter stool today. NO f/c, no n/v.   Scheduled Inpatient Medications:  . Darunavir Ethanolate  800 mg Oral Q breakfast  . dolutegravir  50 mg Oral Daily  . enalapril  20 mg Oral Daily  . feeding supplement  1 Container Oral TID BM  . lipase/protease/amylase  24,000 Units Oral TID WC  . loratadine  10 mg Oral Daily  . pantoprazole (PROTONIX) IV  40 mg Intravenous Q12H  . ritonavir  100 mg Oral Q breakfast  . triamcinolone cream  1 application Topical BID    Continuous Inpatient Infusions:     PRN Inpatient Medications:  acetaminophen **OR** acetaminophen, benzonatate, dicyclomine, hydrALAZINE, HYDROmorphone (DILAUDID) injection, ondansetron **OR** ondansetron (ZOFRAN) IV, oxyCODONE  Review of Systems: Constitutional: Weight is stable.  Eyes: No changes in vision. ENT: No oral lesions, sore throat.  GI: see HPI.  Heme/Lymph: No easy bruising.  CV: No chest pain.  GU: No hematuria.  Integumentary: No rashes.  Neuro: No headaches.  Psych: No depression/anxiety.  Endocrine: No heat/cold intolerance.  Allergic/Immunologic: No urticaria.  Resp: No cough, SOB.  Musculoskeletal: No joint swelling.    Physical Examination: BP 169/96 mmHg  Pulse 80  Temp(Src) 98.1 F (36.7 C) (Oral)  Resp 18  Ht 5\' 4"  (1.626 m)  Wt 57.879 kg (127 lb 9.6 oz)  BMI 21.89 kg/m2  SpO2 99% Gen: NAD, alert and oriented x 4 Neck: supple, no JVD or thyromegaly Chest: CTA bilaterally, no wheezes, crackles, or other adventitious sounds CV: RRR, no m/g/c/r Abd: soft, + epi TTP, ND, +BS in all four quadrants; no HSM, guarding, ridigity, or rebound tenderness Ext: no edema, well perfused with 2+ pulses, Skin: no rash or lesions noted Lymph: no  LAD  Data: Lab Results  Component Value Date   WBC 6.3 07/03/2015   HGB 12.3* 07/03/2015   HCT 36.5* 07/03/2015   MCV 92.6 07/03/2015   PLT 206 07/03/2015    Recent Labs Lab 07/02/15 0438 07/03/15 0422 07/03/15 1817  HGB 13.2 11.5* 12.3*   Lab Results  Component Value Date   NA 138 07/04/2015   K 3.0* 07/04/2015   CL 105 07/04/2015   CO2 29 07/04/2015   BUN <5* 07/04/2015   CREATININE 0.71 07/04/2015   Lab Results  Component Value Date   ALT 19 07/01/2015   AST 22 07/01/2015   ALKPHOS 110 07/01/2015   BILITOT 1.4* 07/01/2015   No results for input(s): APTT, INR, PTT in the last 168 hours.   Assessment/Plan: Mason Martin is a 50 y.o. male with chronic, panc, panc pseudocysts, coffee ground emeisis.  1.) COffee ground emesis: two very stools overnight he reports.  No hgb checked today.  - Hgb in am - npo after mn - EGD if drop in Hgb - cont PPI  2.) Chronic panc with pseudocysts:  MRI shows only pseudocysts.  They are small, largest 3 cm. Doubt this is causing the abd pain.  No evidence of infection. Would not pursue drainage at this time. Needs outpt monitoring.    Please call with questions or concerns.  Teshia Mahone, Addison NaegeliMATTHEW GORDON, MD

## 2015-07-04 NOTE — Plan of Care (Signed)
Problem: Safety: Goal: Ability to remain free from injury will improve Outcome: Progressing Pt is moderate falls, call bell and phone within reach, pt remains free of injury this shift  Problem: Pain Managment: Goal: General experience of comfort will improve Outcome: Progressing Pt has c/o pain in his abdomen, prn meds given with improvement, pt resting comfortably in bed  Problem: Skin Integrity: Goal: Risk for impaired skin integrity will decrease Outcome: Progressing VSS

## 2015-07-04 NOTE — Plan of Care (Signed)
Problem: Safety: Goal: Ability to remain free from injury will improve Outcome: Progressing Steady gait, ambulates independently in room.  Problem: Pain Managment: Goal: General experience of comfort will improve Outcome: Progressing Complained of abd pain, dilaudid IV given with improvement.  Problem: Education: Goal: Knowledge of Pancreatitis treatment and prevention will improve Outcome: Progressing Continues IV fluids.

## 2015-07-04 NOTE — Progress Notes (Signed)
[facility] [place] at [facility]   PATIENT NAME: Mason Martin    MR#:  [id number]  DATE OF BIRTH:  [birth year]  SUBJECTIVE: 50 year old male patient admitted for acute abdominal pain and also found to have acute pancreatitis.feels better.no abdominal pain.no nausea.tolerating diet.  CHIEF COMPLAINT:   Chief Complaint  Patient presents with  . Abdominal Pain    n/v - hx of pancreatitis    REVIEW OF SYSTEMS:   Review of Systems  Constitutional: Negative for fever and chills.  HENT: Negative for hearing loss.   Eyes: Negative for blurred vision, double vision and photophobia.  Respiratory: Negative for cough, hemoptysis and shortness of breath.   Cardiovascular: Negative for chest pain, palpitations, orthopnea and leg swelling.  Gastrointestinal: Positive for abdominal pain. Negative for nausea, vomiting and diarrhea.  Genitourinary: Negative for dysuria and urgency.  Musculoskeletal: Negative for myalgias and neck pain.  Skin: Negative for rash.  Neurological: Negative for dizziness, focal weakness, seizures, weakness and headaches.  Psychiatric/Behavioral: Negative for memory loss. The patient does not have insomnia.      DRUG ALLERGIES:   Allergies  Allergen Reactions  . Sulfur Hives    VITALS:  Blood pressure 143/81, pulse 78, temperature 97.5 F (36.4 C), temperature source Oral, resp. rate 18, height 5\' 4"  (1.626 m), weight 57.879 kg (127 lb 9.6 oz), SpO2 98 %.  PHYSICAL EXAMINATION:  GENERAL:  50 y.o.-year-old patient lying in the bed with no acute distress.  EYES: Pupils equal, round, reactive to light and accommodation. No scleral icterus. Extraocular muscles intact.  HEENT: Head atraumatic, normocephalic. Oropharynx and nasopharynx clear.  NECK:  Supple, no jugular venous distention. No thyroid enlargement, no tenderness.  LUNGS: Normal breath sounds bilaterally, no wheezing, rales,rhonchi or crepitation. No use of accessory muscles of  respiration.  CARDIOVASCULAR: S1, S2 normal. No murmurs, rubs, or gallops.  ABDOMEN: Slight epigastric tenderness present ,no rebound tenderness. bowel sounds present. no organomegaly.  EXTREMITIES: No pedal edema, cyanosis, or clubbing.  NEUROLOGIC: Cranial nerves II through XII are intact. Muscle strength 5/5 in all extremities. Sensation intact. Gait not checked.  PSYCHIATRIC: The patient is alert and oriented x 3.  SKIN: No obvious rash, lesion, or ulcer.    LABORATORY PANEL:   CBC  Recent Labs Lab 07/03/15 1817  WBC 6.3  HGB 12.3*  HCT 36.5*  PLT 206   ------------------------------------------------------------------------------------------------------------------  Chemistries   Recent Labs Lab 07/01/15 1231  07/04/15 0457  NA 142  < > 138  K 3.0*  < > 3.0*  CL 106  < > 105  CO2 25  < > 29  GLUCOSE 119*  < > 102*  BUN 7  < > <5*  CREATININE 0.76  < > 0.71  CALCIUM 9.3  < > 8.2*  MG  --   --  1.4*  AST 22  --   --   ALT 19  --   --   ALKPHOS 110  --   --   BILITOT 1.4*  --   --   < > = values in this interval not displayed. ------------------------------------------------------------------------------------------------------------------  Cardiac Enzymes No results for input(s): TROPONINI in the last 168 hours. ------------------------------------------------------------------------------------------------------------------  RADIOLOGY:  Mr Abdomen W Wo Contrast  07/04/2015  CLINICAL DATA:  50 year old male with history of alcohol use. Generalized abdominal pain with nausea and vomiting for the past 3 days. EXAM: MRI ABDOMEN WITHOUT AND WITH CONTRAST TECHNIQUE: Multiplanar multisequence MR imaging of the abdomen was performed both before  and after the administration of intravenous contrast. CONTRAST:  11mL MULTIHANCE GADOBENATE DIMEGLUMINE 529 MG/ML IV SOLN COMPARISON:  No priors.  CT the abdomen and pelvis 07/01/2015. FINDINGS: Lower chest: Areas of dependent  signal abnormality are noted in the lower lobes of the lungs bilaterally, presumably areas of subsegmental atelectasis or scarring. Hepatobiliary: No suspicious cystic or solid hepatic lesions. Several T1 hypointense, T2 hyperintense, nonenhancing subcentimeter lesions are noted, compatible with tiny cysts or biliary hamartomas. No intra or extrahepatic biliary ductal dilatation. Gallbladder is normal in appearance. Specifically, no gallstones. Pancreas: In the inferior area aspect of the head and uncinate process of the pancreas there is a lesion that is heterogeneous in signal intensity on T1 and T2 weighted images. On T1 weighted images, there is considerable internal T1 hyperintensity, suggesting hemorrhagic and/or proteinaceous products. On T2 weighted images, this is relatively T2 hypointense. This lesion does not restrict diffusion. Post gadolinium images demonstrate little to no internal enhancement. This lesion overall measures 2.2 x 4.2 x 3.2 cm. There are 2 other smaller lesions with similar imaging characteristics also noted in the head of the pancreas, largest of which measures 2.9 x 1.6 cm (image 57 of series 104), with a smaller lesion measuring 1 cm immediately lateral to the superior mesenteric vein before the junction of the splenic portal confluence. Remaining portions of the body and tail of the pancreas are otherwise normal in appearance. Specifically, there is no pancreatic ductal dilatation. No overt peripancreatic inflammatory changes. Spleen: 1.7 x 1.2 x 1.4 cm T1 hypointense, T2 hyperintense, nonenhancing lesion in the lower aspect of the spleen is compatible with a small cyst. Adrenals/Urinary Tract: Bilateral kidneys and bilateral adrenal glands are normal in appearance. No hydroureteronephrosis in the visualized abdomen. Stomach/Bowel: Visualized portions are unremarkable. Vascular/Lymphatic: No aneurysm identified in the visualized abdominal vasculature. Other: No significant volume of  ascites in the visualized peritoneal cavity. Musculoskeletal: No aggressive osseous lesions are noted in the visualized portions of the skeleton. IMPRESSION: 1. Today's study demonstrates 3 lesions in the head of the pancreas, largest of which measures 2.2 x 4.2 x 3.2 cm. 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 Given the relatively large size (greater than 3 cm in diameter) of the biggest lesion, correlation with endoscopic ultrasound for cyst aspiration is recommended. This recommendation follows ACR consensus guidelines: Managing Incidental Findings on Abdominal CT: White Paper of the ACR Incidental Findings Committee. J Am Coll Radiol 2010;7:754-773. 2. Additional incidental findings, as above. Electronically Signed   By: Trudie Reedaniel  Entrikin M.D.   On: 07/04/2015 10:53    EKG:   Orders placed or performed during the hospital encounter of 07/01/15  . ED EKG  . ED EKG  . EKG 12-Lead  . EKG 12-Lead    ASSESSMENT AND PLAN:   #1 abdominal pain secondary to acute pancreatitis, improved.no necrotizing pancreatitis as per GI,so we d/c abx.she is feeling better.tolerating diet.MRI ABDOMEN Showed pseudocyst,needs drainage.appreciate GI input #2. Coffee  Ground  vomiting likely secondary to acute    Alcoholic gastritis; improved. 3 .history of HIV: Continue antiretrovirals. 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 #5 hypertension ;uncontrolled. Likely due to pain. Control the pain, continue BP medication. 6.COPD stable no wheezing. 7. Severe hypokalemia with hypomagnesemia;improving with supplemets #8. history of neck pain with cervical neuralgia;, supposed to have a surgery for the neck the same .likley d/c am All the records are reviewed and case discussed with Care  Management/Social Workerr. Management plans discussed with the patient, family and they  are in agreement.  CODE STATUS:full  TOTAL TIME TAKING CARE OF THIS PATIENT: 35 minutes.   POSSIBLE D/C IN 2-3  DAYS, DEPENDING ON CLINICAL CONDITION.   Katha Hamming M.D on 07/04/2015 at 3:07 PM  Between 7am to 6pm - Pager - [phone]  After 6pm go to [email or web address] - password [email or web address]  [facility]  Office  [phone]  CC: Primary care physician; Pcp Not In System   Note: This dictation was prepared with Dragon dictation along with smaller phrase technology. Any transcriptional errors that result from this process are unintentional.

## 2015-07-05 LAB — BASIC METABOLIC PANEL
ANION GAP: 5 (ref 5–15)
BUN: <5 mg/dL — ABNORMAL LOW (ref 6–20)
CO2: 27 mmol/L (ref 22–32)
Calcium: 8.7 mg/dL — ABNORMAL LOW (ref 8.9–10.3)
Chloride: 105 mmol/L (ref 101–111)
Creatinine, Ser: 0.54 mg/dL — ABNORMAL LOW (ref 0.61–1.24)
GFR calc Af Amer: >60 mL/min (ref >60)
GLUCOSE: 127 mg/dL — AB (ref 65–99)
POTASSIUM: 3.5 mmol/L (ref 3.5–5.1)
Sodium: 137 mmol/L (ref 135–145)

## 2015-07-05 LAB — CBC
HEMATOCRIT: 33.2 % — AB (ref 40.0–52.0)
HEMOGLOBIN: 11.2 g/dL — AB (ref 13.0–18.0)
MCH: 31.1 pg (ref 26.0–34.0)
MCHC: 33.7 g/dL (ref 32.0–36.0)
MCV: 92.2 fL (ref 80.0–100.0)
Platelets: 226 10*3/uL (ref 150–440)
RBC: 3.6 MIL/uL — AB (ref 4.40–5.90)
RDW: 14.9 % — ABNORMAL HIGH (ref 11.5–14.5)
WBC: 4.6 10*3/uL (ref 3.8–10.6)

## 2015-07-05 NOTE — Care Management Important Message (Signed)
Important Message  Patient Details  Name: Mason LatheRodney L Ford MRN: [id number] Date of Birth: 09/21/1965   Medicare Important Message Given:  Yes    Olegario MessierKathy A Ozie Lupe 07/05/2015, 9:53 AM

## 2015-07-05 NOTE — Progress Notes (Signed)
Patient is discharge home in a stable condition , summary and f/u care given verbalized understanding , no prescription on discharge given ,

## 2015-07-05 NOTE — Plan of Care (Signed)
Problem: Safety: Goal: Ability to remain free from injury will improve Outcome: Progressing Ambulates in room independently with steady gait.     Problem: Pain Managment: Goal: General experience of comfort will improve Outcome: Progressing PRN pain med given for pain with improvement.  Problem: Education: Goal: Knowledge of Pancreatitis treatment and prevention will improve Outcome: Progressing NPO except ice and sip with med this am due to possible procedure today.

## 2015-07-05 NOTE — Progress Notes (Signed)
[facility] [place] [facility]   PATIENT NAME: Mason PainRodney Martin    MR#:  [id number]  DATE OF BIRTH:  09/17/1965  SUBJECTIVE: 50 year old male patient admitted for acute abdominal Martin and also found to have acute pancreatitis.feels better.no abdominal Martin. NPO for possible EGD today.   CHIEF COMPLAINT:   Chief Complaint  Patient presents with  . Abdominal Martin    n/v - hx of pancreatitis    REVIEW OF SYSTEMS:   Review of Systems  Constitutional: Negative for fever and chills.  HENT: Negative for hearing loss.   Eyes: Negative for blurred vision, double vision and photophobia.  Respiratory: Negative for cough, hemoptysis and shortness of breath.   Cardiovascular: Negative for chest Martin, palpitations, orthopnea and leg swelling.  Gastrointestinal: Positive for abdominal Martin. Negative for nausea, vomiting and diarrhea.  Genitourinary: Negative for dysuria and urgency.  Musculoskeletal: Negative for myalgias and neck Martin.  Skin: Negative for rash.  Neurological: Negative for dizziness, focal weakness, seizures, weakness and headaches.  Psychiatric/Behavioral: Negative for memory loss. The patient does not have insomnia.      DRUG ALLERGIES:   Allergies  Allergen Reactions  . Sulfur Hives    VITALS:  Blood pressure 176/93, pulse 64, temperature 97.5 F (36.4 C), temperature source Oral, resp. rate 18, height 5\' 4"  (1.626 m), weight 57.879 kg (127 lb 9.6 oz), SpO2 100 %.  PHYSICAL EXAMINATION:  GENERAL:  50 y.o.-year-old patient lying in the bed with no acute distress.  EYES: Pupils equal, round, reactive to light and accommodation. No scleral icterus. Extraocular muscles intact.  HEENT: Head atraumatic, normocephalic. Oropharynx and nasopharynx clear.  NECK:  Supple, no jugular venous distention. No thyroid enlargement, no tenderness.  LUNGS: Normal breath sounds bilaterally, no wheezing, rales,rhonchi or crepitation. No use of accessory  muscles of respiration.  CARDIOVASCULAR: S1, S2 normal. No murmurs, rubs, or gallops.  ABDOMEN: Slight epigastric tenderness present ,no rebound tenderness. bowel sounds present. no organomegaly.  EXTREMITIES: No pedal edema, cyanosis, or clubbing.  NEUROLOGIC: Cranial nerves II through XII are intact. Muscle strength 5/5 in all extremities. Sensation intact. Gait not checked.  PSYCHIATRIC: The patient is alert and oriented x 3.  SKIN: No obvious rash, lesion, or ulcer.    LABORATORY PANEL:   CBC  Recent Labs Lab 07/05/15 0515  WBC 4.6  HGB 11.2*  HCT 33.2*  PLT 226   ------------------------------------------------------------------------------------------------------------------  Chemistries   Recent Labs Lab 07/01/15 1231  07/04/15 0457 07/05/15 0515  NA 142  < > 138 137  K 3.0*  < > 3.0* 3.5  CL 106  < > 105 105  CO2 25  < > 29 27  GLUCOSE 119*  < > 102* 127*  BUN 7  < > <5* <5*  CREATININE 0.76  < > 0.71 0.54*  CALCIUM 9.3  < > 8.2* 8.7*  MG  --   --  1.4*  --   AST 22  --   --   --   ALT 19  --   --   --   ALKPHOS 110  --   --   --   BILITOT 1.4*  --   --   --   < > = values in this interval not displayed. ------------------------------------------------------------------------------------------------------------------  Cardiac Enzymes No results for input(s): TROPONINI in the last 168 hours. ------------------------------------------------------------------------------------------------------------------  RADIOLOGY:  Mr Abdomen W Wo Contrast  07/04/2015  CLINICAL DATA:  50 year old male with history of alcohol use. Generalized abdominal  Martin with nausea and vomiting for the past 3 days. EXAM: MRI ABDOMEN WITHOUT AND WITH CONTRAST TECHNIQUE: Multiplanar multisequence MR imaging of the abdomen was performed both before and after the administration of intravenous contrast. CONTRAST:  11mL MULTIHANCE GADOBENATE DIMEGLUMINE 529 MG/ML IV SOLN COMPARISON:  No  priors.  CT the abdomen and pelvis 07/01/2015. FINDINGS: Lower chest: Areas of dependent signal abnormality are noted in the lower lobes of the lungs bilaterally, presumably areas of subsegmental atelectasis or scarring. Hepatobiliary: No suspicious cystic or solid hepatic lesions. Several T1 hypointense, T2 hyperintense, nonenhancing subcentimeter lesions are noted, compatible with tiny cysts or biliary hamartomas. No intra or extrahepatic biliary ductal dilatation. Gallbladder is normal in appearance. Specifically, no gallstones. Pancreas: In the inferior area aspect of the head and uncinate process of the pancreas there is a lesion that is heterogeneous in signal intensity on T1 and T2 weighted images. On T1 weighted images, there is considerable internal T1 hyperintensity, suggesting hemorrhagic and/or proteinaceous products. On T2 weighted images, this is relatively T2 hypointense. This lesion does not restrict diffusion. Post gadolinium images demonstrate little to no internal enhancement. This lesion overall measures 2.2 x 4.2 x 3.2 cm. There are 2 other smaller lesions with similar imaging characteristics also noted in the head of the pancreas, largest of which measures 2.9 x 1.6 cm (image 57 of series 104), with a smaller lesion measuring 1 cm immediately lateral to the superior mesenteric vein before the junction of the splenic portal confluence. Remaining portions of the body and tail of the pancreas are otherwise normal in appearance. Specifically, there is no pancreatic ductal dilatation. No overt peripancreatic inflammatory changes. Spleen: 1.7 x 1.2 x 1.4 cm T1 hypointense, T2 hyperintense, nonenhancing lesion in the lower aspect of the spleen is compatible with a small cyst. Adrenals/Urinary Tract: Bilateral kidneys and bilateral adrenal glands are normal in appearance. No hydroureteronephrosis in the visualized abdomen. Stomach/Bowel: Visualized portions are unremarkable. Vascular/Lymphatic: No  aneurysm identified in the visualized abdominal vasculature. Other: No significant volume of ascites in the visualized peritoneal cavity. Musculoskeletal: No aggressive osseous lesions are noted in the visualized portions of the skeleton. IMPRESSION: 1. Today's study demonstrates 3 lesions in the head of the pancreas, largest of which measures 2.2 x 4.2 x 3.2 cm. 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 Given the relatively large size (greater than 3 cm in diameter) of the biggest lesion, correlation with endoscopic ultrasound for cyst aspiration is recommended. This recommendation follows ACR consensus guidelines: Managing Incidental Findings on Abdominal CT: White Paper of the ACR Incidental Findings Committee. J Am Coll Radiol 2010;7:754-773. 2. Additional incidental findings, as above. Electronically Signed   By: Trudie Reed M.D.   On: 07/04/2015 10:53    EKG:   Orders placed or performed during the hospital encounter of 07/01/15  . ED EKG  . ED EKG  . EKG 12-Lead  . EKG 12-Lead    ASSESSMENT AND PLAN:   #1 abdominal Martin secondary to acute pancreatitis, improved.no necrotizing pancreatitis as per GI,so we d/c abx.she is feeling better.tolerating diet.MRI ABDOMEN Showed pseudocyst,needs drainage.appreciate GI input #2. Coffee  Ground  vomiting likely secondary to acute    Alcoholic gastritis; improved. Hemoglobin also stable. Nothing by mouth for possible EGD by GI. Hemoglobin 11.2. 3 .history of HIV: Continue antiretrovirals. 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 #5 hypertension ;uncontrolled. Likely due to Martin. Control the Martin, continue BP medication.  6.COPD stable no wheezing. 7. Severe hypokalemia with hypomagnesemia;improving with supplemets #8. history of neck Martin with cervical neuralgia;, supposed to have a surgery for the neck  the same .  Patient prefers to go home today because  HE  has appointment at [facility] tomorrow. All the records are reviewed and case discussed with Care Management/Social Workerr. Management plans discussed with the patient, family and they are in agreement.  CODE STATUS:full  TOTAL TIME TAKING CARE OF THIS PATIENT: 35 minutes.   POSSIBLE D/C IN 2-3  DAYS, DEPENDING ON CLINICAL CONDITION.   Katha Hamming M.D on 07/05/2015 at 10:49 AM  Between 7am to 6pm - Pager - [phone]  After 6pm go to [email or web address] - password [email or web address]  [facility]  Office  [phone]  CC: Primary care physician; Pcp Not In System   Note: This dictation was prepared with Dragon dictation along with smaller phrase technology. Any transcriptional errors that result from this process are unintentional.

## 2015-07-06 DIAGNOSIS — F1010 Alcohol abuse, uncomplicated: Secondary | ICD-10-CM | POA: Insufficient documentation

## 2015-07-06 NOTE — Discharge Summary (Signed)
Mason Martin, is a 50 y.o. male  DOB Oct 30, 1965  MRN [id number].  Admission date:  07/01/2015  Admitting Physician  Enid Baas, MD  Discharge Date:  07/06/2015   Primary MD  Pcp Not In System  Recommendations for primary care physician for things to follow:   Follow-up with GI,   Admission Diagnosis  Epigastric pain [R10.13] Pain [R52] Acute pancreatitis, unspecified pancreatitis type [K85.9]   Discharge Diagnosis  Epigastric pain [R10.13] Pain [R52] Acute pancreatitis, unspecified pancreatitis type [K85.9]    Active Problems:   Acute pancreatitis      Past Medical History  Diagnosis Date  . Pancreas cyst   . HIV disease (HCC)     on HAART, follows with ID at [facility]  . Hypertension   . Pancreatitis     chronic pancreatitis  . Tobacco use   . COPD (chronic obstructive pulmonary disease) (HCC)   . TIA (transient ischemic attack)   . GERD (gastroesophageal reflux disease)   . Clot     in intra abdominal vessels- off coumadin now  . Neck pain     with left arm radiculopathy    Past Surgical History  Procedure Laterality Date  . Esophageal atresia repair         History of present illness and  Hospital Course:     Kindly see H&P for history of present illness and admission details, please review complete Labs, Consult reports and Test reports for all details in brief  HPI  from the history and physical done on the day of admission 50 yr old male with a known history of alcohol use, chronic pancreatitis, HIV on HAART, COPD not on home oxygen, admitted for abdominal pain, nausea, vomiting. Have acute pancreatitis.. CT of the abdomen showing acute pancreatitis with inflammation around pancreatic head, possible necrosis cannot be ruled out. Lipase is within normal limits especially with his chronic  pancreatitis. He is being admitted for acute on chronic pancreatitis with possible necrotizing pancreatitis   Hospital Course   1. Acute on chronic pancreatitis: Patient admitted to hospitalist service, started on nothing by mouth, IV hydration, IV antibiotics. Concern for necrotizing pancreatitis because of that he got antibiotics. Seen by GI, abdominal pain improved the following day with IV pain medications. Start clear liquids diet. Patient MRI of the abdomen shows pseudocyst. Patient advised to follow-up with his GI doctor at [facility]. And advised to continue anicteric supplements at discharge.  #2 coffee-ground vomiting  secondary to GASTRITIS: Patient did not require transfusions. And the started on Protonix drip. And changed it to by mouth Protonix.  did not get an EGD because patient already has appointments at [facility] advised patient to continue to follow up with them.    #3 COPD: No wheezing. #4 severe hypokalemia hypomagnesemia; replaced. 5.history of HIV continue antiretrovirals. Discharge Condition: stable   Follow UP  Follow-up Information    Follow up with GI at [facility] next week In 1 week.        Discharge Instructions  and  Discharge Medications        Medication List    TAKE these medications        benzonatate 100 MG capsule  Commonly known as:  TESSALON PERLES  Take 1 capsule (100 mg total) by mouth every 6 (six) hours as needed for cough.     cetirizine 10 MG tablet  Commonly known as:  ZYRTEC  Take 10 mg by mouth daily.     CREON 24000  units Cpep  Generic drug:  Pancrelipase (Lip-Prot-Amyl)  Take 24,000 Units by mouth 3 (three) times daily with meals.     enalapril 20 MG tablet  Commonly known as:  VASOTEC  Take 20 mg by mouth daily.     ketoconazole 2 % cream  Commonly known as:  NIZORAL  Apply 1 application topically 2 (two) times daily.     NEXIUM 40 MG capsule  Generic drug:  esomeprazole  Take 40 mg by mouth daily before breakfast.     NORVIR  100 MG Tabs tablet  Generic drug:  ritonavir  Take 100 mg by mouth daily.     oxyCODONE 5 MG immediate release tablet  Commonly known as:  ROXICODONE  Take 1 tablet (5 mg total) by mouth every 8 (eight) hours as needed.     PREZISTA 800 MG tablet  Generic drug:  Darunavir Ethanolate  Take 800 mg by mouth daily with breakfast.     TIVICAY 50 MG tablet  Generic drug:  dolutegravir  Take 50 mg by mouth daily.     triamcinolone cream 0.5 %  Commonly known as:  KENALOG  Apply 1 application topically 2 (two) times daily.     VENTOLIN HFA 108 (90 Base) MCG/ACT inhaler  Generic drug:  albuterol  Inhale 2 puffs into the lungs 4 (four) times daily as needed. For shortness of breath and/or wheezing.          Diet and Activity recommendation: See Discharge Instructions above   Consults obtained -GI  Major procedures and Radiology Reports - PLEASE review detailed and final reports for all details, in brief -      Mr Abdomen W Wo Contrast  07/04/2015  CLINICAL DATA:  50 year old male with history of alcohol use. Generalized abdominal pain with nausea and vomiting for the past 3 days. EXAM: MRI ABDOMEN WITHOUT AND WITH CONTRAST TECHNIQUE: Multiplanar multisequence MR imaging of the abdomen was performed both before and after the administration of intravenous contrast. CONTRAST:  11mL MULTIHANCE GADOBENATE DIMEGLUMINE 529 MG/ML IV SOLN COMPARISON:  No priors.  CT the abdomen and pelvis 07/01/2015. FINDINGS: Lower chest: Areas of dependent signal abnormality are noted in the lower lobes of the lungs bilaterally, presumably areas of subsegmental atelectasis or scarring. Hepatobiliary: No suspicious cystic or solid hepatic lesions. Several T1 hypointense, T2 hyperintense, nonenhancing subcentimeter lesions are noted, compatible with tiny cysts or biliary hamartomas. No intra or extrahepatic biliary ductal dilatation. Gallbladder is normal in appearance. Specifically, no gallstones. Pancreas: In  the inferior area aspect of the head and uncinate process of the pancreas there is a lesion that is heterogeneous in signal intensity on T1 and T2 weighted images. On T1 weighted images, there is considerable internal T1 hyperintensity, suggesting hemorrhagic and/or proteinaceous products. On T2 weighted images, this is relatively T2 hypointense. This lesion does not restrict diffusion. Post gadolinium images demonstrate little to no internal enhancement. This lesion overall measures 2.2 x 4.2 x 3.2 cm. There are 2 other smaller lesions with similar imaging characteristics also noted in the head of the pancreas, largest of which measures 2.9 x 1.6 cm (image 57 of series 104), with a smaller lesion measuring 1 cm immediately lateral to the superior mesenteric vein before the junction of the splenic portal confluence. Remaining portions of the body and tail of the pancreas are otherwise normal in appearance. Specifically, there is no pancreatic ductal dilatation. No overt peripancreatic inflammatory changes. Spleen: 1.7 x 1.2 x 1.4 cm T1 hypointense, T2 hyperintense,  nonenhancing lesion in the lower aspect of the spleen is compatible with a small cyst. Adrenals/Urinary Tract: Bilateral kidneys and bilateral adrenal glands are normal in appearance. No hydroureteronephrosis in the visualized abdomen. Stomach/Bowel: Visualized portions are unremarkable. Vascular/Lymphatic: No aneurysm identified in the visualized abdominal vasculature. Other: No significant volume of ascites in the visualized peritoneal cavity. Musculoskeletal: No aggressive osseous lesions are noted in the visualized portions of the skeleton. IMPRESSION: 1. Today's study demonstrates 3 lesions in the head of the pancreas, largest of which measures 2.2 x 4.2 x 3.2 cm. 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 Given the relatively large size (greater than 3 cm  in diameter) of the biggest lesion, correlation with endoscopic ultrasound for cyst aspiration is recommended. This recommendation follows ACR consensus guidelines: Managing Incidental Findings on Abdominal CT: White Paper of the ACR Incidental Findings Committee. J Am Coll Radiol 2010;7:754-773. 2. Additional incidental findings, as above. Electronically Signed   By: Trudie Reed M.D.   On: 07/04/2015 10:53   Ct Abdomen Pelvis W Contrast  07/01/2015  CLINICAL DATA:  50 year old male with epigastric and periumbilical pain for the past day. Nausea and vomiting. Prior history of pancreatitis. EXAM: CT ABDOMEN AND PELVIS WITH CONTRAST TECHNIQUE: Multidetector CT imaging of the abdomen and pelvis was performed using the standard protocol following bolus administration of intravenous contrast. CONTRAST:  OMNIPAQUE IOHEXOL 300 MG/ML SOLN, 25mL OMNIPAQUE IOHEXOL 240 MG/ML SOLN COMPARISON:  CT the abdomen and pelvis 01/25/2011. FINDINGS: Lower chest:  Unremarkable. Hepatobiliary: Sub cm low-attenuation lesion in segment 4A of the liver is too small to definitively characterize, but is statistically likely a tiny cyst. No intra or extrahepatic biliary ductal dilatation gallbladder is unremarkable in appearance. Pancreas: In the inferior aspect of the pancreatic head and uncinate process there is a mass-like area of hypoenhancement which measures approximately 3.0 x 4.4 x 4.3 cm (image 30 of series 2 and image 55 of coronal series 5). This mass-like area appears to arise within the pancreas, but exerts mass effect upon the adjacent second, third and fourth portions of the duodenum which appear draped around this area. It is uncertain whether or not this is truly a mass, or simply an area of hypoenhancement (i.e., an area of pancreatic necrosis in the setting of acute pancreatitis). However, there is relatively little surrounding inflammatory changes in the adjacent peripancreatic fat. Remaining portions of the  superior aspect of the pancreatic head, body and tail are otherwise normal in appearance. No pancreatic ductal dilatation. Notably, this area in the inferior aspect of the pancreatic head is well separated from the superior mesenteric vein and artery, splenic vein, portal vein and hepatic artery. Spleen: Well-defined 1.7 x 1.4 cm low-attenuation lesion in the lateral aspect of the inferior spleen, incompletely characterized, but favored to be benign likely a cyst or lymphangioma. Adrenals/Urinary Tract: Bilateral adrenal glands and bilateral kidneys are normal in appearance. No hydroureteronephrosis. Urinary bladder is normal in appearance. Stomach/Bowel: Normal appearance of the stomach. No pathologic dilatation of small bowel or colon. Normal appendix. Vascular/Lymphatic: Atherosclerosis throughout the abdominal and pelvic vasculature. Vascular findings relative to the possible mass in the pancreatic head, as discussed above. No lymphadenopathy noted in the abdomen or pelvis. Calcified portacaval lymph node incidentally noted. Reproductive: Prostate gland and seminal vesicles are unremarkable in appearance. Other: No significant volume of ascites.  No pneumoperitoneum. Musculoskeletal: There are no aggressive appearing lytic or blastic lesions noted in the visualized portions  of the skeleton. IMPRESSION: 1. There is a mass-like area of hypoenhancement in the inferior aspect of the pancreatic head and uncinate process, as discussed above. While this could simply represent an area of focal pancreatitis, there is relatively little inflammatory changes around it. If this is in fact pancreatitis, the hypoenhancement would be concerning for developing pancreatic necrosis. Alternatively, this may in fact represent a primary pancreatic neoplasm. Correlation for signs and symptoms of pancreatitis is recommended, and consideration for further evaluation with endoscopic ultrasound is suggested if clinically appropriate. 2.  Normal appendix. 3. Atherosclerosis. 4. Additional incidental findings, as above. Electronically Signed   By: Trudie Reed M.D.   On: 07/01/2015 15:35    Micro Results    Recent Results (from the past 240 hour(s))  C difficile quick scan w PCR reflex     Status: None   Collection Time: 07/04/15  1:18 AM  Result Value Ref Range Status   C Diff antigen NEGATIVE NEGATIVE Final   C Diff toxin NEGATIVE NEGATIVE Final   C Diff interpretation Negative for C. difficile  Final       Today   Subjective:   Mason Martin today has no headache,no chest abdominal pain,no new weakness tingling or numbness, feels much better wants to go home today.  Objective:   Blood pressure 174/112, pulse 94, temperature 97.6 F (36.4 C), temperature source Oral, resp. rate 19, height 5\' 4"  (1.626 m), weight 57.879 kg (127 lb 9.6 oz), SpO2 100 %.   Intake/Output Summary (Last 24 hours) at 07/06/15 1252 Last data filed at 07/05/15 1300  Gross per 24 hour  Intake    120 ml  Output      0 ml  Net    120 ml    Exam Awake Alert, Oriented x 3, No new F.N deficits, Normal affect [place].AT,PERRAL Supple Neck,No JVD, No cervical lymphadenopathy appriciated.  Symmetrical Chest wall movement, Good air movement bilaterally, CTAB RRR,No Gallops,Rubs or new Murmurs, No Parasternal Heave +ve B.Sounds, Abd Soft, Non tender, No organomegaly appriciated, No rebound -guarding or rigidity. No Cyanosis, Clubbing or edema, No new Rash or bruise  Data Review   CBC w Diff: Lab Results  Component Value Date   WBC 4.6 07/05/2015   WBC 7.0 08/04/2014   HGB 11.2* 07/05/2015   HGB 13.8 08/04/2014   HCT 33.2* 07/05/2015   HCT 40.9 08/04/2014   PLT 226 07/05/2015   PLT 282 08/04/2014   LYMPHOPCT 23 07/01/2015   LYMPHOPCT 39.6 08/04/2014   MONOPCT 12 07/01/2015   MONOPCT 7.9 08/04/2014   EOSPCT 2 07/01/2015   EOSPCT 2.7 08/04/2014   BASOPCT 1 07/01/2015   BASOPCT 0.6 08/04/2014    CMP: Lab Results  Component  Value Date   NA 137 07/05/2015   NA 143 08/04/2014   K 3.5 07/05/2015   K 3.2* 08/04/2014   CL 105 07/05/2015   CL 109* 08/04/2014   CO2 27 07/05/2015   CO2 26 08/04/2014   BUN <5* 07/05/2015   BUN 10 08/04/2014   CREATININE 0.54* 07/05/2015   CREATININE 0.91 08/04/2014   PROT 7.4 07/01/2015   PROT 6.7 08/04/2014   ALBUMIN 4.0 07/01/2015   ALBUMIN 3.6 08/04/2014   BILITOT 1.4* 07/01/2015   BILITOT 0.2 08/04/2014   ALKPHOS 110 07/01/2015   ALKPHOS 75 08/04/2014   AST 22 07/01/2015   AST 13* 08/04/2014   ALT 19 07/01/2015   ALT 13* 08/04/2014  .   Total Time in preparing paper work,  data evaluation and todays exam - 35 minutes  Nandan Willems M.D on 07/06/2015 at 12:52 PM    Note: This dictation was prepared with Dragon dictation along with smaller phrase technology. Any transcriptional errors that result from this process are unintentional.

## 2015-07-17 ENCOUNTER — Ambulatory Visit
Admission: RE | Admit: 2015-07-17 | Discharge: 2015-07-17 | Disposition: A | Discharge status: Home / Self Care | Payer: Medicare Other | Source: Ambulatory Visit | Attending: Infectious Diseases | Admitting: Infectious Diseases

## 2015-07-17 DIAGNOSIS — M50221 Other cervical disc displacement at C4-C5 level: Secondary | ICD-10-CM | POA: Insufficient documentation

## 2015-07-17 DIAGNOSIS — M5412 Radiculopathy, cervical region: Secondary | ICD-10-CM | POA: Diagnosis present

## 2015-07-17 DIAGNOSIS — M50222 Other cervical disc displacement at C5-C6 level: Secondary | ICD-10-CM | POA: Insufficient documentation

## 2015-07-17 DIAGNOSIS — M4802 Spinal stenosis, cervical region: Secondary | ICD-10-CM | POA: Insufficient documentation

## 2015-07-17 MED ORDER — GADOBENATE DIMEGLUMINE 529 MG/ML IV SOLN
15.0000 mL | Freq: Once | INTRAVENOUS | Status: AC | PRN
  Administered 2015-07-17: 12 mL via INTRAVENOUS

## 2015-09-15 ENCOUNTER — Encounter: Payer: Self-pay | Admitting: Emergency Medicine

## 2015-09-15 ENCOUNTER — Emergency Department
Admission: EM | Admit: 2015-09-15 | Discharge: 2015-09-15 | Disposition: A | Discharge status: Home / Self Care | Payer: Medicare Other | Attending: Emergency Medicine | Admitting: Emergency Medicine

## 2015-09-15 DIAGNOSIS — M79606 Pain in leg, unspecified: Secondary | ICD-10-CM | POA: Diagnosis not present

## 2015-09-15 DIAGNOSIS — I10 Essential (primary) hypertension: Secondary | ICD-10-CM | POA: Diagnosis not present

## 2015-09-15 DIAGNOSIS — Z658 Other specified problems related to psychosocial circumstances: Secondary | ICD-10-CM | POA: Diagnosis not present

## 2015-09-15 DIAGNOSIS — Z79899 Other long term (current) drug therapy: Secondary | ICD-10-CM | POA: Insufficient documentation

## 2015-09-15 DIAGNOSIS — J449 Chronic obstructive pulmonary disease, unspecified: Secondary | ICD-10-CM | POA: Diagnosis not present

## 2015-09-15 DIAGNOSIS — Z8661 Personal history of infections of the central nervous system: Secondary | ICD-10-CM | POA: Diagnosis not present

## 2015-09-15 DIAGNOSIS — B20 Human immunodeficiency virus [HIV] disease: Secondary | ICD-10-CM | POA: Diagnosis not present

## 2015-09-15 DIAGNOSIS — F17210 Nicotine dependence, cigarettes, uncomplicated: Secondary | ICD-10-CM | POA: Diagnosis not present

## 2015-09-15 DIAGNOSIS — Z5321 Procedure and treatment not carried out due to patient leaving prior to being seen by health care provider: Secondary | ICD-10-CM | POA: Insufficient documentation

## 2015-09-15 DIAGNOSIS — Z8673 Personal history of transient ischemic attack (TIA), and cerebral infarction without residual deficits: Secondary | ICD-10-CM | POA: Insufficient documentation

## 2015-09-15 DIAGNOSIS — Z8719 Personal history of other diseases of the digestive system: Secondary | ICD-10-CM | POA: Insufficient documentation

## 2015-09-15 DIAGNOSIS — M545 Low back pain: Secondary | ICD-10-CM | POA: Insufficient documentation

## 2015-09-15 DIAGNOSIS — G971 Other reaction to spinal and lumbar puncture: Secondary | ICD-10-CM | POA: Diagnosis present

## 2015-09-15 HISTORY — DX: Personal history of infections of the central nervous system: Z86.61

## 2015-09-15 NOTE — ED Notes (Signed)
Pt had LP yesterday.  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  Has not gotten results from yesterday.  C/o pain across entire lower back since LP and pain down left leg today.

## 2015-09-15 NOTE — ED Notes (Signed)
Pt c/o mild headache as well.

## 2015-12-19 ENCOUNTER — Emergency Department: Payer: Medicare Other

## 2015-12-19 ENCOUNTER — Emergency Department
Admission: EM | Admit: 2015-12-19 | Discharge: 2015-12-19 | Disposition: A | Discharge status: Home / Self Care | Payer: Medicare Other | Attending: Emergency Medicine | Admitting: Emergency Medicine

## 2015-12-19 ENCOUNTER — Encounter: Payer: Self-pay | Admitting: Emergency Medicine

## 2015-12-19 DIAGNOSIS — B20 Human immunodeficiency virus [HIV] disease: Secondary | ICD-10-CM | POA: Insufficient documentation

## 2015-12-19 DIAGNOSIS — R1010 Upper abdominal pain, unspecified: Secondary | ICD-10-CM

## 2015-12-19 DIAGNOSIS — R112 Nausea with vomiting, unspecified: Secondary | ICD-10-CM | POA: Diagnosis not present

## 2015-12-19 DIAGNOSIS — R1013 Epigastric pain: Secondary | ICD-10-CM | POA: Diagnosis present

## 2015-12-19 DIAGNOSIS — J449 Chronic obstructive pulmonary disease, unspecified: Secondary | ICD-10-CM | POA: Diagnosis not present

## 2015-12-19 DIAGNOSIS — Z79899 Other long term (current) drug therapy: Secondary | ICD-10-CM | POA: Insufficient documentation

## 2015-12-19 DIAGNOSIS — Z8673 Personal history of transient ischemic attack (TIA), and cerebral infarction without residual deficits: Secondary | ICD-10-CM | POA: Diagnosis not present

## 2015-12-19 DIAGNOSIS — R197 Diarrhea, unspecified: Secondary | ICD-10-CM | POA: Diagnosis not present

## 2015-12-19 DIAGNOSIS — F17200 Nicotine dependence, unspecified, uncomplicated: Secondary | ICD-10-CM | POA: Insufficient documentation

## 2015-12-19 DIAGNOSIS — I10 Essential (primary) hypertension: Secondary | ICD-10-CM | POA: Diagnosis not present

## 2015-12-19 LAB — URINALYSIS COMPLETE WITH MICROSCOPIC (ARMC ONLY)
BACTERIA UA: NONE SEEN
Bilirubin Urine: NEGATIVE
Glucose, UA: NEGATIVE mg/dL
HGB URINE DIPSTICK: NEGATIVE
Ketones, ur: NEGATIVE mg/dL
Leukocytes, UA: NEGATIVE
NITRITE: NEGATIVE
PROTEIN: NEGATIVE mg/dL
SPECIFIC GRAVITY, URINE: 1.002 — AB (ref 1.005–1.030)
Squamous Epithelial / LPF: NONE SEEN
WBC UA: NONE SEEN WBC/hpf (ref 0–5)
pH: 6 (ref 5.0–8.0)

## 2015-12-19 LAB — COMPREHENSIVE METABOLIC PANEL
ALBUMIN: 4 g/dL (ref 3.5–5.0)
ALK PHOS: 78 U/L (ref 38–126)
ALT: 11 U/L — ABNORMAL LOW (ref 17–63)
ANION GAP: 8 (ref 5–15)
AST: 17 U/L (ref 15–41)
BUN: 7 mg/dL (ref 6–20)
CALCIUM: 9.6 mg/dL (ref 8.9–10.3)
CO2: 24 mmol/L (ref 22–32)
Chloride: 108 mmol/L (ref 101–111)
Creatinine, Ser: 0.75 mg/dL (ref 0.61–1.24)
GFR calc non Af Amer: >60 mL/min (ref >60)
Glucose, Bld: 98 mg/dL (ref 65–99)
POTASSIUM: 3.3 mmol/L — AB (ref 3.5–5.1)
SODIUM: 140 mmol/L (ref 135–145)
Total Bilirubin: 0.5 mg/dL (ref 0.3–1.2)
Total Protein: 7 g/dL (ref 6.5–8.1)

## 2015-12-19 LAB — CBC
HEMATOCRIT: 40.6 % (ref 40.0–52.0)
HEMOGLOBIN: 13.9 g/dL (ref 13.0–18.0)
MCH: 31.6 pg (ref 26.0–34.0)
MCHC: 34.1 g/dL (ref 32.0–36.0)
MCV: 92.4 fL (ref 80.0–100.0)
Platelets: 240 10*3/uL (ref 150–440)
RBC: 4.39 MIL/uL — AB (ref 4.40–5.90)
RDW: 15.9 % — ABNORMAL HIGH (ref 11.5–14.5)
WBC: 5.4 10*3/uL (ref 3.8–10.6)

## 2015-12-19 LAB — LIPASE, BLOOD: LIPASE: 35 U/L (ref 11–51)

## 2015-12-19 MED ORDER — MORPHINE SULFATE (PF) 4 MG/ML IV SOLN
4.0000 mg | Freq: Once | INTRAVENOUS | Status: AC
  Administered 2015-12-19: 4 mg via INTRAVENOUS
  Filled 2015-12-19: qty 1

## 2015-12-19 MED ORDER — SODIUM CHLORIDE 0.9 % IV BOLUS (SEPSIS)
1000.0000 mL | Freq: Once | INTRAVENOUS | Status: AC
  Administered 2015-12-19: 1000 mL via INTRAVENOUS

## 2015-12-19 MED ORDER — ONDANSETRON HCL 4 MG/2ML IJ SOLN
4.0000 mg | Freq: Once | INTRAMUSCULAR | Status: AC
  Administered 2015-12-19: 4 mg via INTRAVENOUS
  Filled 2015-12-19: qty 2

## 2015-12-19 MED ORDER — RANITIDINE HCL 150 MG PO TABS: 150.0000 mg | ORAL_TABLET | Freq: Two times a day (BID) | ORAL | Status: DC

## 2015-12-19 MED ORDER — DIATRIZOATE MEGLUMINE & SODIUM 66-10 % PO SOLN
15.0000 mL | Freq: Once | ORAL | Status: AC
  Administered 2015-12-19: 15 mL via ORAL

## 2015-12-19 MED ORDER — ONDANSETRON HCL 4 MG PO TABS: 4.0000 mg | ORAL_TABLET | Freq: Every day | ORAL | Status: DC | PRN

## 2015-12-19 MED ORDER — IOPAMIDOL (ISOVUE-300) INJECTION 61%
100.0000 mL | Freq: Once | INTRAVENOUS | Status: AC | PRN
  Administered 2015-12-19: 100 mL via INTRAVENOUS

## 2015-12-19 NOTE — ED Notes (Signed)
Pt to ed with c/o vomiting and abd pain since Sat.  Also reports diarrhea,  States hx of pancreatitis.

## 2015-12-19 NOTE — Discharge Instructions (Signed)
Abdominal Pain, Adult °Many things can cause belly (abdominal) pain. Most times, the belly pain is not dangerous. Many cases of belly pain can be watched and treated at home. °HOME CARE  °· Do not take medicines that help you go poop (laxatives) unless told to by your doctor. °· Only take medicine as told by your doctor. °· Eat or drink as told by your doctor. Your doctor will tell you if you should be on a special diet. °GET HELP IF: °· You do not know what is causing your belly pain. °· You have belly pain while you are sick to your stomach (nauseous) or have runny poop (diarrhea). °· You have pain while you pee or poop. °· Your belly pain wakes you up at night. °· You have belly pain that gets worse or better when you eat. °· You have belly pain that gets worse when you eat fatty foods. °· You have a fever. °GET HELP RIGHT AWAY IF:  °· The pain does not go away within 2 hours. °· You keep throwing up (vomiting). °· The pain changes and is only in the right or left part of the belly. °· You have bloody or tarry looking poop. °MAKE SURE YOU:  °· Understand these instructions. °· Will watch your condition. °· Will get help right away if you are not doing well or get worse. °  °This information is not intended to replace advice given to you by your health care provider. Make sure you discuss any questions you have with your health care provider. °  °Document Released: 12/04/2007 Document Revised: 07/08/2014 Document Reviewed: 02/24/2013 °Elsevier Interactive Patient Education ©2016 [employer] ° °Nausea and Vomiting °Nausea is a sick feeling that often comes before throwing up (vomiting). Vomiting is a reflex where stomach contents come out of your mouth. Vomiting can cause severe loss of body fluids (dehydration). Children and elderly adults can become dehydrated quickly, especially if they also have diarrhea. Nausea and vomiting are symptoms of a condition or disease. It is important to find the cause of your  symptoms. °CAUSES  °· Direct irritation of the stomach lining. This irritation can result from increased acid production (gastroesophageal reflux disease), infection, food poisoning, taking certain medicines (such as nonsteroidal anti-inflammatory drugs), alcohol use, or tobacco use. °· Signals from the brain. These signals could be caused by a headache, heat exposure, an inner ear disturbance, increased pressure in the brain from injury, infection, a tumor, or a concussion, pain, emotional stimulus, or metabolic problems. °· An obstruction in the gastrointestinal tract (bowel obstruction). °· Illnesses such as diabetes, hepatitis, gallbladder problems, appendicitis, kidney problems, cancer, sepsis, atypical symptoms of a heart attack, or eating disorders. °· Medical treatments such as chemotherapy and radiation. °· Receiving medicine that makes you sleep (general anesthetic) during surgery. °DIAGNOSIS °Your caregiver may ask for tests to be done if the problems do not improve after a few days. Tests may also be done if symptoms are severe or if the reason for the nausea and vomiting is not clear. Tests may include: °· Urine tests. °· Blood tests. °· Stool tests. °· Cultures (to look for evidence of infection). °· X-rays or other imaging studies. °Test results can help your caregiver make decisions about treatment or the need for additional tests. °TREATMENT °You need to stay well hydrated. Drink frequently but in small amounts. You may wish to drink water, sports drinks, clear broth, or eat frozen ice pops or gelatin dessert to help stay hydrated. When you eat, eating   slowly may help prevent nausea.There are also some antinausea medicines that may help prevent nausea. HOME CARE INSTRUCTIONS   Take all medicine as directed by your caregiver.  If you do not have an appetite, do not force yourself to eat. However, you must continue to drink fluids.  If you have an appetite, eat a normal diet unless your  caregiver tells you differently.  Eat a variety of complex carbohydrates (rice, wheat, potatoes, bread), lean meats, yogurt, fruits, and vegetables.  Avoid high-fat foods because they are more difficult to digest.  Drink enough water and fluids to keep your urine clear or pale yellow.  If you are dehydrated, ask your caregiver for specific rehydration instructions. Signs of dehydration may include:  Severe thirst.  Dry lips and mouth.  Dizziness.  Dark urine.  Decreasing urine frequency and amount.  Confusion.  Rapid breathing or pulse. SEEK IMMEDIATE MEDICAL CARE IF:   You have blood or brown flecks (like coffee grounds) in your vomit.  You have black or bloody stools.  You have a severe headache or stiff neck.  You are confused.  You have severe abdominal pain.  You have chest pain or trouble breathing.  You do not urinate at least once every 8 hours.  You develop cold or clammy skin.  You continue to vomit for longer than 24 to 48 hours.  You have a fever. MAKE SURE YOU:   Understand these instructions.  Will watch your condition.  Will get help right away if you are not doing well or get worse.   This information is not intended to replace advice given to you by your health care provider. Make sure you discuss any questions you have with your health care provider.   Document Released: 06/17/2005 Document Revised: 09/09/2011 Document Reviewed: 11/14/2010 Elsevier Interactive Patient Education 2016 [employer] Inc[employer]  Diarrhea Diarrhea is watery poop (stool). It can make you feel weak, tired, thirsty, or give you a dry mouth (signs of dehydration). Watery poop is a sign of another problem, most often an infection. It often lasts 2-3 days. It can last longer if it is a sign of something serious. Take care of yourself as told by your doctor. HOME CARE   Drink 1 cup (8 ounces) of fluid each time you have watery poop.  Do not drink the following  fluids:  Those that contain simple sugars (fructose, glucose, galactose, lactose, sucrose, maltose).  Sports drinks.  Fruit juices.  Whole milk products.  Sodas.  Drinks with caffeine (coffee, tea, soda) or alcohol.  Oral rehydration solution may be used if the doctor says it is okay. You may make your own solution. Follow this recipe:   - teaspoon table salt.   teaspoon baking soda.   teaspoon salt substitute containing potassium chloride.  1 tablespoons sugar.  1 liter (34 ounces) of water.  Avoid the following foods:  High fiber foods, such as raw fruits and vegetables.  Nuts, seeds, and whole grain breads and cereals.   Those that are sweetened with sugar alcohols (xylitol, sorbitol, mannitol).  Try eating the following foods:  Starchy foods, such as rice, toast, pasta, low-sugar cereal, oatmeal, baked potatoes, crackers, and bagels.  Bananas.  Applesauce.  Eat probiotic-rich foods, such as yogurt and milk products that are fermented.  Wash your hands well after each time you have watery poop.  Only take medicine as told by your doctor.  Take a warm bath to help lessen burning or pain from having watery poop.  GET HELP RIGHT AWAY IF:   You cannot drink fluids without throwing up (vomiting).  You keep throwing up.  You have blood in your poop, or your poop looks black and tarry.  You do not pee (urinate) in 6-8 hours, or there is only a small amount of very dark pee.  You have belly (abdominal) pain that gets worse or stays in the same spot (localizes).  You are weak, dizzy, confused, or light-headed.  You have a very bad headache.  Your watery poop gets worse or does not get better.  You have a fever or lasting symptoms for more than 2-3 days.  You have a fever and your symptoms suddenly get worse. MAKE SURE YOU:   Understand these instructions.  Will watch your condition.  Will get help right away if you are not doing well or get  worse.   This information is not intended to replace advice given to you by your health care provider. Make sure you discuss any questions you have with your health care provider.   Document Released: 12/04/2007 Document Revised: 07/08/2014 Document Reviewed: 02/23/2012 Elsevier Interactive Patient Education [employer]

## 2015-12-19 NOTE — ED Provider Notes (Addendum)
[facility] Emergency Department Provider Note   ____________________________________________  Time seen: Approximately 540 PM  I have reviewed the triage vital signs and the nursing notes.   HISTORY  Chief Complaint Abdominal Pain   HPI Mason Martin is a 50 y.o. male with a history of HIV as well as pancreatitis who is presenting to the emergency department today with epigastric abdominal pain which is ongoing over the past 4 days. As the pain as a 10 of 10 and sharp. He says that it feels similar to his previous episodes of pancreatitis. He says that he thinks that he inflamed his pancreas because he drank a lot of "brown liquor" over the weekend. He says that he also drinks about 4 beers every weekend. Says that he is also having 2-3 episodes of nonbloody diarrhea per day and says that he has had this in the past with his pancreatitis. Also several episodes of vomiting. Has not vomited here in the emergency department.Denies any recent antibiotics.   Past Medical History  Diagnosis Date  . Pancreas cyst   . HIV disease (HCC)     on HAART, follows with ID at [facility]  . Hypertension   . Pancreatitis     chronic pancreatitis  . Tobacco use   . COPD (chronic obstructive pulmonary disease) (HCC)   . TIA (transient ischemic attack)   . GERD (gastroesophageal reflux disease)   . Clot     in intra abdominal vessels- off coumadin now  . Neck pain     with left arm radiculopathy  . History of meningitis     Patient Active Problem List   Diagnosis Date Noted  . Acute pancreatitis 07/01/2015    Past Surgical History  Procedure Laterality Date  . Esophageal atresia repair      Current Outpatient Rx  Name  Route  Sig  Dispense  Refill  . benzonatate (TESSALON PERLES) 100 MG capsule   Oral   Take 1 capsule (100 mg total) by mouth every 6 (six) hours as needed for cough.   30 capsule   0   . cetirizine (ZYRTEC) 10 MG tablet   Oral   Take 10 mg by  mouth daily.      9   . CREON 24000 units CPEP   Oral   Take 24,000 Units by mouth 3 (three) times daily with meals.      5     Dispense as written.   . enalapril (VASOTEC) 20 MG tablet   Oral   Take 20 mg by mouth daily.      8   . ketoconazole (NIZORAL) 2 % cream   Topical   Apply 1 application topically 2 (two) times daily.      11   . NEXIUM 40 MG capsule   Oral   Take 40 mg by mouth daily before breakfast.      11     Dispense as written.   . NORVIR 100 MG TABS tablet   Oral   Take 100 mg by mouth daily.      11     Dispense as written.   Marland Kitchen oxyCODONE (ROXICODONE) 5 MG immediate release tablet   Oral   Take 1 tablet (5 mg total) by mouth every 8 (eight) hours as needed.   15 tablet   0   . PREZISTA 800 MG tablet   Oral   Take 800 mg by mouth daily with breakfast.  11     Dispense as written.   Marland Kitchen. TIVICAY 50 MG tablet   Oral   Take 50 mg by mouth daily.      11     Dispense as written.   . triamcinolone cream (KENALOG) 0.5 %   Topical   Apply 1 application topically 2 (two) times daily.      2   . VENTOLIN HFA 108 (90 Base) MCG/ACT inhaler   Inhalation   Inhale 2 puffs into the lungs 4 (four) times daily as needed. For shortness of breath and/or wheezing.      5     Dispense as written.     Allergies Sulfur  Family History  Problem Relation Age of Onset  . Heart failure Father   . Emphysema Father     Social History Social History  Substance Use Topics  . Smoking status: Current Every Day Smoker -- 0.50 packs/day  . Smokeless tobacco: None  . Alcohol Use: 0.0 oz/week    0 Standard drinks or equivalent per week     Comment: 3 beers last night    Review of Systems Constitutional: No fever/chills Eyes: No visual changes. ENT: No sore throat. Cardiovascular: Denies chest pain. Respiratory: Denies shortness of breath. Gastrointestinal:   No constipation. Genitourinary: Negative for dysuria. Musculoskeletal:  Negative for back pain. Skin: Negative for rash. Neurological: Negative for headaches, focal weakness or numbness.  10-point ROS otherwise negative.  ____________________________________________   PHYSICAL EXAM:  VITAL SIGNS: ED Triage Vitals  Enc Vitals Group     BP 12/19/15 1319 124/81 mmHg     Pulse Rate 12/19/15 1319 80     Resp 12/19/15 1319 16     Temp 12/19/15 1319 98.7 F (37.1 C)     Temp Source 12/19/15 1319 Oral     SpO2 12/19/15 1319 98 %     Weight 12/19/15 1319 135 lb (61.236 kg)     Height 12/19/15 1319 5\' 4"  (1.626 m)     Head Cir --      Peak Flow --      Pain Score 12/19/15 1319 10     Pain Loc --      Pain Edu? --      Excl. in GC? --     Constitutional: Alert and oriented. Well appearing and in no acute distress.  Once I entered the room the patient began wincing and grabbing his abdomen every time he moved. However, when I initially was coming into the room he was relaxed on the bed without any outward signs of distress on his phone. Eyes: Conjunctivae are normal. PERRL. EOMI. Head: Atraumatic. Nose: No congestion/rhinnorhea. Mouth/Throat: Mucous membranes are moist. Neck: No stridor.   Cardiovascular: Normal rate, regular rhythm. Grossly normal heart sounds.  Good peripheral circulation. Respiratory: Normal respiratory effort.  No retractions. Lungs CTAB. Gastrointestinal: Soft with epigastric tenderness palpation. No rebound or guarding.No CVA tenderness. Musculoskeletal: No lower extremity tenderness nor edema.  No joint effusions. Neurologic:  Normal speech and language. No gross focal neurologic deficits are appreciated.  Skin:  Skin is warm, dry and intact. No rash noted. Psychiatric: Mood and affect are normal. Speech and behavior are normal.   ____________________________________________   LABS (all labs ordered are listed, but only abnormal results are displayed)  Labs Reviewed  COMPREHENSIVE METABOLIC PANEL - Abnormal; Notable for the  following:    Potassium 3.3 (*)    ALT 11 (*)    All other components within normal  limits  CBC - Abnormal; Notable for the following:    RBC 4.39 (*)    RDW 15.9 (*)    All other components within normal limits  URINALYSIS COMPLETEWITH MICROSCOPIC ([facility] ONLY) - Abnormal; Notable for the following:    Color, Urine STRAW (*)    APPearance CLEAR (*)    Specific Gravity, Urine 1.002 (*)    All other components within normal limits  LIPASE, BLOOD   ____________________________________________  EKG   ____________________________________________  RADIOLOGY   CLINICAL DATA: Epigastric pain, history of pancreatitis  EXAM: CT ABDOMEN AND PELVIS WITH CONTRAST  TECHNIQUE: Multidetector CT imaging of the abdomen and pelvis was performed using the standard protocol following bolus administration of intravenous contrast.  CONTRAST: ISOVUE-300 IOPAMIDOL (ISOVUE-300) INJECTION 61%  COMPARISON: CT scan 07/01/2015 and abdominal MRI 07/04/2015  FINDINGS: Lower chest: The lung bases are unremarkable.  Hepatobiliary: Enhanced liver shows mild fatty infiltration. No focal mass. No calcified gallstones are noted within contracted gallbladder.  Pancreas: The pancreas shows normal enhancement. No evidence of focal mass or acute pancreatitis. No dilatation of main pancreatic duct. A focal calcification in portal caval region measures 9 mm probable calcified lymph node. The previous pancreatic pseudocyst inferior aspect of the pancreatic head has resolved. Some residual low density scarring and this level measures about 1.5 cm without evidence of mass.  Spleen: A splenic cyst in inferior lateral aspect of the spleen measures 1.4 cm. On the prior exam measures 1.7 cm.  Adrenals/Urinary Tract: No adrenal gland mass. Enhanced kidneys are symmetrical in size. No hydronephrosis or hydroureter. Delayed renal images shows bilateral renal symmetrical excretion.  Bilateral visualized proximal ureter is unremarkable. The urinary bladder is unremarkable.  Stomach/Bowel: There is no small bowel obstruction. No gastric outlet obstruction. Oral contrast material was given to the patient. No thickened or dilated small bowel loops. There is no pericecal inflammation. Normal appendix is partially visualized in coronal image 53. Some colonic stool noted in transverse colon. The descending colon is partially empty. Some colonic gas noted within sigmoid colon. No distal colonic obstruction. No evidence of colitis or diverticulitis.  Vascular/Lymphatic: Mild atherosclerotic calcifications of abdominal aorta. No aortic aneurysm. No retroperitoneal or mesenteric adenopathy.  Reproductive: Prostate gland and seminal vesicles are unremarkable.  Other: No ascites or free abdominal air. There is no inguinal adenopathy.  Musculoskeletal: Sagittal images of the spine shows mild degenerative changes lumbar spine with multilevel mild anterior spurring. No destructive bony lesions are noted within pelvis.  IMPRESSION: 1. No evidence of acute inflammatory process within abdomen or pelvis. 2. No evidence of acute pancreatitis. No definite pancreatic mass is noted. Improvement in previous pancreatic pseudocyst in inferior aspect of pancreatic head. Only minimal residual scarring at this level measures 1.5 cm. 3. No hydronephrosis or hydroureter. 4. No pericecal inflammation. Normal appendix partially visualized. 5. No small bowel obstruction. 6. Mild degenerative changes lumbar spine.   Electronically Signed  By: Natasha Mead M.D.  On: 12/19/2015 18:55    ____________________________________________   PROCEDURES   ____________________________________________   INITIAL IMPRESSION / ASSESSMENT AND PLAN / ED COURSE  Pertinent labs & imaging results that were available during my care of the patient were reviewed by me and considered in my  medical decision making (see chart for details).  ----------------------------------------- 7:07 PM on 12/19/2015 -----------------------------------------  Patient resting comfortable at this time. Was able tolerate by mouth contrast. CAT scan the abdomen and pelvis are very reassuring. Overall improved from previous. Possible gastritis from drinking this weekend.  We will discharge with Zantac as well as Zofran prescriptions. Patient also says that he is compliant with his HIV medication follows up at [facility]. Says that he has an undetectable viral load but does not remember the last time he was test/bloodwork done. Does not know his last CD4 count. Understands the plan for discharge and is willing to comply. ____________________________________________   FINAL CLINICAL IMPRESSION(S) / ED DIAGNOSES  Upper abdominal pain with nausea vomiting and diarrhea.    NEW MEDICATIONS STARTED DURING THIS VISIT:  New Prescriptions   No medications on file     Note:  This document was prepared using Dragon voice recognition software and may include unintentional dictation errors.    Myrna Blazer, MD 12/19/15 1908  No vomiting or diarrhea in the er.  Pt did not report any bloody emesis or diarrhea.   Myrna Blazer, MD 12/19/15 [phone]

## 2015-12-22 ENCOUNTER — Encounter: Payer: Self-pay | Admitting: *Deleted

## 2015-12-22 ENCOUNTER — Emergency Department
Admission: EM | Admit: 2015-12-22 | Discharge: 2015-12-23 | Disposition: A | Discharge status: Home / Self Care | Payer: Medicare Other | Attending: Emergency Medicine | Admitting: Emergency Medicine

## 2015-12-22 DIAGNOSIS — J449 Chronic obstructive pulmonary disease, unspecified: Secondary | ICD-10-CM | POA: Diagnosis not present

## 2015-12-22 DIAGNOSIS — I10 Essential (primary) hypertension: Secondary | ICD-10-CM | POA: Insufficient documentation

## 2015-12-22 DIAGNOSIS — Z79899 Other long term (current) drug therapy: Secondary | ICD-10-CM | POA: Diagnosis not present

## 2015-12-22 DIAGNOSIS — R112 Nausea with vomiting, unspecified: Secondary | ICD-10-CM | POA: Diagnosis present

## 2015-12-22 DIAGNOSIS — K2970 Gastritis, unspecified, without bleeding: Secondary | ICD-10-CM | POA: Diagnosis not present

## 2015-12-22 DIAGNOSIS — B20 Human immunodeficiency virus [HIV] disease: Secondary | ICD-10-CM | POA: Insufficient documentation

## 2015-12-22 DIAGNOSIS — Z8673 Personal history of transient ischemic attack (TIA), and cerebral infarction without residual deficits: Secondary | ICD-10-CM | POA: Diagnosis not present

## 2015-12-22 DIAGNOSIS — F17210 Nicotine dependence, cigarettes, uncomplicated: Secondary | ICD-10-CM | POA: Insufficient documentation

## 2015-12-22 DIAGNOSIS — E876 Hypokalemia: Secondary | ICD-10-CM | POA: Insufficient documentation

## 2015-12-22 LAB — COMPREHENSIVE METABOLIC PANEL
ALBUMIN: 3.7 g/dL (ref 3.5–5.0)
ALK PHOS: 66 U/L (ref 38–126)
ALT: 11 U/L — AB (ref 17–63)
AST: 16 U/L (ref 15–41)
Anion gap: 7 (ref 5–15)
BUN: 6 mg/dL (ref 6–20)
CALCIUM: 8.4 mg/dL — AB (ref 8.9–10.3)
CO2: 27 mmol/L (ref 22–32)
Chloride: 106 mmol/L (ref 101–111)
Creatinine, Ser: 0.85 mg/dL (ref 0.61–1.24)
GFR calc Af Amer: >60 mL/min (ref >60)
GFR calc non Af Amer: >60 mL/min (ref >60)
GLUCOSE: 117 mg/dL — AB (ref 65–99)
Potassium: 2.9 mmol/L — CL (ref 3.5–5.1)
Sodium: 140 mmol/L (ref 135–145)
TOTAL PROTEIN: 6.2 g/dL — AB (ref 6.5–8.1)

## 2015-12-22 LAB — CBC
HCT: 38.7 % — ABNORMAL LOW (ref 40.0–52.0)
Hemoglobin: 13.2 g/dL (ref 13.0–18.0)
MCH: 31.6 pg (ref 26.0–34.0)
MCHC: 34.1 g/dL (ref 32.0–36.0)
MCV: 92.8 fL (ref 80.0–100.0)
Platelets: 228 10*3/uL (ref 150–440)
RBC: 4.17 MIL/uL — ABNORMAL LOW (ref 4.40–5.90)
RDW: 15.9 % — AB (ref 11.5–14.5)
WBC: 5.5 10*3/uL (ref 3.8–10.6)

## 2015-12-22 LAB — LIPASE, BLOOD: Lipase: 46 U/L (ref 11–51)

## 2015-12-22 MED ORDER — ONDANSETRON HCL 4 MG/2ML IJ SOLN
4.0000 mg | Freq: Once | INTRAMUSCULAR | Status: AC
  Administered 2015-12-22: 4 mg via INTRAVENOUS
  Filled 2015-12-22: qty 2

## 2015-12-22 MED ORDER — SODIUM CHLORIDE 0.9 % IV BOLUS (SEPSIS)
1000.0000 mL | Freq: Once | INTRAVENOUS | Status: AC
  Administered 2015-12-22: 1000 mL via INTRAVENOUS

## 2015-12-22 MED ORDER — MORPHINE SULFATE (PF) 4 MG/ML IV SOLN
4.0000 mg | Freq: Once | INTRAVENOUS | Status: AC
  Administered 2015-12-22: 4 mg via INTRAVENOUS
  Filled 2015-12-22: qty 1

## 2015-12-22 MED ORDER — GI COCKTAIL ~~LOC~~
30.0000 mL | Freq: Once | ORAL | Status: AC
  Administered 2015-12-22: 30 mL via ORAL
  Filled 2015-12-22: qty 30

## 2015-12-22 NOTE — ED Notes (Signed)
Pt presents w/ upper abdominal pain starting at 1600, vomiting x 1 when EMS arrived. Pt is very dramatic when talking about his abdominal pain, but remains laying quietly, in no obvious acute distress between loud outbursts of yelling. VS remain stable throughout.

## 2015-12-22 NOTE — ED Provider Notes (Signed)
[facility] Emergency Department Provider Note   ____________________________________________  Time seen: Approximately 10:00 PM  I have reviewed the triage vital signs and the nursing notes.   HISTORY  Chief Complaint Abdominal Pain and Emesis    HPI Mason Martin is a 50 y.o. male with history of HIV, COPD, chronic pancreatitis who presents for evaluation of epigastric abdominal pain which began today at 4 PM, gradual onset, constant, no modifying factors, currently severe. The patient reports that over the past 6 days he has incidentally had epigastric abdominal pain. 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 He was discharged home with Zantac and Zofran prescriptions for presumed gastritis. He reports that yesterday he felt well however this afternoon he again developed the abdominal pain. He has had one episode of nonbloody nonbilious emesis. The chest pain difficulty breathing, no fevers or chills, he has had diarrhea for some time which is unchanged from prior.   Past Medical History  Diagnosis Date  . Pancreas cyst   . HIV disease (HCC)     on HAART, follows with ID at [facility]  . Hypertension   . Pancreatitis     chronic pancreatitis  . Tobacco use   . COPD (chronic obstructive pulmonary disease) (HCC)   . TIA (transient ischemic attack)   . GERD (gastroesophageal reflux disease)   . Clot     in intra abdominal vessels- off coumadin now  . Neck pain     with left arm radiculopathy  . History of meningitis     Patient Active Problem List   Diagnosis Date Noted  . Acute pancreatitis 07/01/2015    Past Surgical History  Procedure Laterality Date  . Esophageal atresia repair      Current Outpatient Rx  Name  Route   Sig  Dispense  Refill  . benzonatate (TESSALON PERLES) 100 MG capsule   Oral   Take 1 capsule (100 mg total) by mouth every 6 (six) hours as needed for cough.   30 capsule   0   . cetirizine (ZYRTEC) 10 MG tablet   Oral   Take 10 mg by mouth daily.      9   . CREON 24000 units CPEP   Oral   Take 24,000 Units by mouth 3 (three) times daily with meals.      5     Dispense as written.   . enalapril (VASOTEC) 20 MG tablet   Oral   Take 20 mg by mouth daily.      8   . ketoconazole (NIZORAL) 2 % cream   Topical   Apply 1 application topically 2 (two) times daily.      11   . NEXIUM 40 MG capsule   Oral   Take 40 mg by mouth daily before breakfast.      11     Dispense as written.   . NORVIR 100 MG TABS tablet   Oral   Take 100 mg by mouth daily.      11     Dispense as written.   . ondansetron (ZOFRAN) 4 MG tablet   Oral   Take 1 tablet (4 mg total) by mouth daily as needed.   10 tablet   0   . oxyCODONE (  ROXICODONE) 5 MG immediate release tablet   Oral   Take 1 tablet (5 mg total) by mouth every 8 (eight) hours as needed.   15 tablet   0   . PREZISTA 800 MG tablet   Oral   Take 800 mg by mouth daily with breakfast.      11     Dispense as written.   . ranitidine (ZANTAC) 150 MG tablet   Oral   Take 1 tablet (150 mg total) by mouth 2 (two) times daily.   60 tablet   0   . TIVICAY 50 MG tablet   Oral   Take 50 mg by mouth daily.      11     Dispense as written.   . triamcinolone cream (KENALOG) 0.5 %   Topical   Apply 1 application topically 2 (two) times daily.      2   . VENTOLIN HFA 108 (90 Base) MCG/ACT inhaler   Inhalation   Inhale 2 puffs into the lungs 4 (four) times daily as needed. For shortness of breath and/or wheezing.      5     Dispense as written.     Allergies Sulfur  Family History  Problem Relation Age of Onset  . Heart failure Father   . Emphysema Father     Social History Social History    Substance Use Topics  . Smoking status: Current Every Day Smoker -- 0.50 packs/day    Types: Cigarettes  . Smokeless tobacco: Never Used  . Alcohol Use: 0.0 oz/week    0 Standard drinks or equivalent per week     Comment: 3 beers last night    Review of Systems Constitutional: No fever/chills Eyes: No visual changes. ENT: No sore throat. Cardiovascular: Denies chest pain. Respiratory: Denies shortness of breath. Gastrointestinal: +abdominal pain.  +nausea, + vomiting.  No diarrhea.  + constipation. Genitourinary: Negative for dysuria. Musculoskeletal: Negative for back pain. Skin: Negative for rash. Neurological: Negative for headaches, focal weakness or numbness.  10-point ROS otherwise negative.  ____________________________________________   PHYSICAL EXAM:  VITAL SIGNS: ED Triage Vitals  Enc Vitals Group     BP 12/22/15 2137 131/98 mmHg     Pulse Rate 12/22/15 2137 68     Resp 12/22/15 2137 11     Temp 12/22/15 2137 98.2 F (36.8 C)     Temp Source 12/22/15 2137 Oral     SpO2 12/22/15 2137 95 %     Weight 12/22/15 2137 140 lb (63.504 kg)     Height 12/22/15 2137 5\' 4"  (1.626 m)     Head Cir --      Peak Flow --      Pain Score 12/22/15 2139 10     Pain Loc --      Pain Edu? --      Excl. in GC? --     Constitutional: Alert and oriented. Intermittently, he appears to be in distress secondary to pain, grimacing from time to time. Eyes: Conjunctivae are normal. PERRL. EOMI. Head: Atraumatic. Nose: No congestion/rhinnorhea. Mouth/Throat: Mucous membranes are moist.  Oropharynx non-erythematous. Neck: No stridor. Supple without meningismus. Cardiovascular: Normal rate, regular rhythm. Grossly normal heart sounds.  Good peripheral circulation. Respiratory: Normal respiratory effort.  No retractions. Lungs CTAB. Gastrointestinal: Soft with diffuse tenderness to palpation. No rebound or guarding. Genitourinary: Deferred Musculoskeletal: No lower extremity  tenderness nor edema.  No joint effusions. Neurologic:  Normal speech and language. No gross focal neurologic deficits  are appreciated.  Skin:  Skin is warm, dry and intact. No rash noted. Psychiatric: Mood and affect are normal. Speech and behavior are normal.  ____________________________________________   LABS (all labs ordered are listed, but only abnormal results are displayed)  Labs Reviewed  LIPASE, BLOOD  COMPREHENSIVE METABOLIC PANEL  CBC  URINALYSIS COMPLETEWITH MICROSCOPIC ([facility] ONLY)   ____________________________________________  EKG  ED ECG REPORT I, Gayla DossGayle, Jeselle Hiser A, the attending physician, personally viewed and interpreted this ECG.   Date: 12/22/2015  EKG Time: 21:46  Rate: 67  Rhythm: normal EKG, normal sinus rhythm  Axis: normal  Intervals:none  ST&T Change: No Acute ST elevation  ____________________________________________  RADIOLOGY  none ____________________________________________   PROCEDURES  Procedure(s) performed: None  Critical Care performed: No  ____________________________________________   INITIAL IMPRESSION / ASSESSMENT AND PLAN / ED COURSE  Pertinent labs & imaging results that were available during my care of the patient were reviewed by me and considered in my medical decision making (see chart for details).  Mason Martin is a 50 y.o. male with history of HIV, COPD, chronic pancreatitis who presents for evaluation of epigastric abdominal pain which began today at 4 PM. On exam, he is intermittently in mild distress due to pain. He has soft abdomen with diffuse tenderness. EKG is reassuring, not consistent with acute ischemia. I suspect this may represent ongoing gastritis however we'll obtain screening labs, treat his pain, give GI cocktail, reassess for disposition and need for advanced imaging.   ----------------------------------------- 10:56 PM on 12/22/2015 -----------------------------------------  Patient's  entire workup is pending at this time. Care transferred to Dr. York Ceriseforbach. ____________________________________________   FINAL CLINICAL IMPRESSION(S) / ED DIAGNOSES  Final diagnoses:  Epigastric pain      NEW MEDICATIONS STARTED DURING THIS VISIT:  New Prescriptions   No medications on file     Note:  This document was prepared using Dragon voice recognition software and may include unintentional dictation errors.    Gayla DossEryka A Kaylon Hitz, MD 12/22/15 2257

## 2015-12-22 NOTE — ED Notes (Signed)
Critical lab K+ 2.9 reported to Dr. Inocencio HomesGayle

## 2015-12-22 NOTE — ED Provider Notes (Signed)
-----------------------------------------   11:18 PM on 12/22/2015 -----------------------------------------   Blood pressure 151/93, pulse 66, temperature 98.2 F (36.8 C), temperature source Oral, resp. rate 20, height 5\' 4"  (1.626 m), weight 63.504 kg, SpO2 96 %.  Assuming care from Dr. Inocencio HomesGayle.  In short, Mason Martin is a 50 y.o. male with a chief complaint of Abdominal Pain and Emesis .  Refer to the original H&P for additional details.  The current plan of care is to follow-up labs and reassess.  ----------------------------------------- 12:08 AM on 12/23/2015 -----------------------------------------  Results for orders placed or performed during the hospital encounter of 12/22/15  Lipase, blood  Result Value Ref Range   Lipase 46 11 - 51 U/L  Comprehensive metabolic panel  Result Value Ref Range   Sodium 140 135 - 145 mmol/L   Potassium 2.9 (LL) 3.5 - 5.1 mmol/L   Chloride 106 101 - 111 mmol/L   CO2 27 22 - 32 mmol/L   Glucose, Bld 117 (H) 65 - 99 mg/dL   BUN 6 6 - 20 mg/dL   Creatinine, Ser 6.570.85 0.61 - 1.24 mg/dL   Calcium 8.4 (L) 8.9 - 10.3 mg/dL   Total Protein 6.2 (L) 6.5 - 8.1 g/dL   Albumin 3.7 3.5 - 5.0 g/dL   AST 16 15 - 41 U/L   ALT 11 (L) 17 - 63 U/L   Alkaline Phosphatase 66 38 - 126 U/L   Total Bilirubin <0.1 (L) 0.3 - 1.2 mg/dL   GFR calc non Af Amer >60 >60 mL/min   GFR calc Af Amer >60 >60 mL/min   Anion gap 7 5 - 15  CBC  Result Value Ref Range   WBC 5.5 3.8 - 10.6 K/uL   RBC 4.17 (L) 4.40 - 5.90 MIL/uL   Hemoglobin 13.2 13.0 - 18.0 g/dL   HCT 84.638.7 (L) 96.240.0 - 95.252.0 %   MCV 92.8 80.0 - 100.0 fL   MCH 31.6 26.0 - 34.0 pg   MCHC 34.1 32.0 - 36.0 g/dL   RDW 84.115.9 (H) 32.411.5 - 40.114.5 %   Platelets 228 150 - 440 K/uL    Patient's potassium is little bit low but otherwise his labs are unremarkable.  I reassessed him and he states he has 0 pain at this time.  He is concerned his pain is going to come back and he asked for prescription for pain medicine as  well as a dose of pain medicine before he leaves.  I explained that I am comfortable giving him a dose before he goes to make sure his pain secondary during the night but that I do not feel comfortable writing a prescription for narcotic pain medicine given that all his labs are normal and he is currently completely pain free.  I will also prescribe a potassium supplement and give him a dose of potassium 40 mEq before he goes.  He states that he has nausea medicine at home.  I am also providing prescription for sucralfate.  I encourage close outpatient follow-up and I gave my usual and customary return precautions.  He understands and agrees with plan.  New Prescriptions   No medications on file     Mason Roseory Cardale Dorer, MD 12/23/15 2256

## 2015-12-22 NOTE — ED Notes (Signed)
SL PIV.

## 2015-12-23 DIAGNOSIS — K2970 Gastritis, unspecified, without bleeding: Secondary | ICD-10-CM | POA: Diagnosis not present

## 2015-12-23 LAB — URINALYSIS COMPLETE WITH MICROSCOPIC (ARMC ONLY)
BILIRUBIN URINE: NEGATIVE
Glucose, UA: NEGATIVE mg/dL
HGB URINE DIPSTICK: NEGATIVE
KETONES UR: NEGATIVE mg/dL
LEUKOCYTES UA: NEGATIVE
NITRITE: NEGATIVE
PH: 7 (ref 5.0–8.0)
PROTEIN: NEGATIVE mg/dL
SPECIFIC GRAVITY, URINE: 1.012 (ref 1.005–1.030)
Squamous Epithelial / LPF: NONE SEEN

## 2015-12-23 MED ORDER — POTASSIUM CHLORIDE CRYS ER 20 MEQ PO TBCR: 20.0000 meq | EXTENDED_RELEASE_TABLET | Freq: Every day | ORAL | Status: DC

## 2015-12-23 MED ORDER — POTASSIUM CHLORIDE CRYS ER 20 MEQ PO TBCR
40.0000 meq | EXTENDED_RELEASE_TABLET | Freq: Once | ORAL | Status: AC
  Administered 2015-12-23: 40 meq via ORAL
  Filled 2015-12-23: qty 2

## 2015-12-23 MED ORDER — HYDROCODONE-ACETAMINOPHEN 5-325 MG PO TABS
2.0000 | ORAL_TABLET | Freq: Once | ORAL | Status: AC
  Administered 2015-12-23: 2 via ORAL
  Filled 2015-12-23: qty 2

## 2015-12-23 MED ORDER — SUCRALFATE 1 G PO TABS: 1.0000 g | ORAL_TABLET | Freq: Four times a day (QID) | ORAL | Status: DC | PRN

## 2015-12-23 NOTE — Discharge Instructions (Signed)
You have been seen in the Emergency Department (ED) for abdominal pain.  Your evaluation did not identify a clear cause of your symptoms but was generally reassuring.  Please read through the included information about gastritis, as well as about hypokalemia; your potassium level is a little bit low today, but we gave you a supplement before you left the emergency department as well as a prescription for supplements over the next week.  Please follow up as instructed above regarding todays emergent visit and the symptoms that are bothering you.  Return to the ED if your abdominal pain worsens or fails to improve, you develop bloody vomiting, bloody diarrhea, you are unable to tolerate fluids due to vomiting, fever greater than 101, or other symptoms that concern you.   Gastritis, Adult Gastritis is soreness and puffiness (inflammation) of the lining of the stomach. If you do not get help, gastritis can cause bleeding and sores (ulcers) in the stomach. HOME CARE   Only take medicine as told by your doctor.  If you were given antibiotic medicines, take them as told. Finish the medicines even if you start to feel better.  Drink enough fluids to keep your pee (urine) clear or pale yellow.  Avoid foods and drinks that make your problems worse. Foods you may want to avoid include:  Caffeine or alcohol.  Chocolate.  Mint.  Garlic and onions.  Spicy foods.  Citrus fruits, including oranges, lemons, or limes.  Food containing tomatoes, including sauce, chili, salsa, and pizza.  Fried and fatty foods.  Eat small meals throughout the day instead of large meals. GET HELP RIGHT AWAY IF:   You have black or dark red poop (stools).  You throw up (vomit) blood. It may look like coffee grounds.  You cannot keep fluids down.  Your belly (abdominal) pain gets worse.  You have a fever.  You do not feel better after 1 week.  You have any other questions or concerns. MAKE SURE YOU:    Understand these instructions.  Will watch your condition.  Will get help right away if you are not doing well or get worse.   This information is not intended to replace advice given to you by your health care provider. Make sure you discuss any questions you have with your health care provider.   Document Released: 12/04/2007 Document Revised: 09/09/2011 Document Reviewed: 07/31/2011 Elsevier Interactive Patient Education 2016 [employer].  Hypokalemia Hypokalemia means that the amount of potassium in the blood is lower than normal.Potassium is a chemical, called an electrolyte, that helps regulate the amount of fluid in the body. It also stimulates muscle contraction and helps nerves function properly.Most of the body's potassium is inside of cells, and only a very small amount is in the blood. Because the amount in the blood is so small, minor changes can be life-threatening. CAUSES  Antibiotics.  Diarrhea or vomiting.  Using laxatives too much, which can cause diarrhea.  Chronic kidney disease.  Water pills (diuretics).  Eating disorders (bulimia).  Low magnesium level.  Sweating a lot. SIGNS AND SYMPTOMS  Weakness.  Constipation.  Fatigue.  Muscle cramps.  Mental confusion.  Skipped heartbeats or irregular heartbeat (palpitations).  Tingling or numbness. DIAGNOSIS  Your health care provider can diagnose hypokalemia with blood tests. In addition to checking your potassium level, your health care provider may also check other lab tests. TREATMENT Hypokalemia can be treated with potassium supplements taken by mouth or adjustments in your current medicines. If your potassium  level is very low, you may need to get potassium through a vein (IV) and be monitored in the hospital. A diet high in potassium is also helpful. Foods high in potassium are:  Nuts, such as peanuts and pistachios.  Seeds, such as sunflower seeds and pumpkin seeds.  Peas, lentils, and  lima beans.  Whole grain and bran cereals and breads.  Fresh fruit and vegetables, such as apricots, avocado, bananas, cantaloupe, kiwi, oranges, tomatoes, asparagus, and potatoes.  Orange and tomato juices.  Red meats.  Fruit yogurt. HOME CARE INSTRUCTIONS  Take all medicines as prescribed by your health care provider.  Maintain a healthy diet by including nutritious food, such as fruits, vegetables, nuts, whole grains, and lean meats.  If you are taking a laxative, be sure to follow the directions on the label. SEEK MEDICAL CARE IF:  Your weakness gets worse.  You feel your heart pounding or racing.  You are vomiting or having diarrhea.  You are diabetic and having trouble keeping your blood glucose in the normal range. SEEK IMMEDIATE MEDICAL CARE IF:  You have chest pain, shortness of breath, or dizziness.  You are vomiting or having diarrhea for more than 2 days.  You faint. MAKE SURE YOU:   Understand these instructions.  Will watch your condition.  Will get help right away if you are not doing well or get worse.   This information is not intended to replace advice given to you by your health care provider. Make sure you discuss any questions you have with your health care provider.   Document Released: 06/17/2005 Document Revised: 07/08/2014 Document Reviewed: 12/18/2012 Elsevier Interactive Patient Education 2016 Elsevier Inc.  Potassium Content of Foods Potassium is a mineral found in many foods and drinks. It helps keep fluids and minerals balanced in your body and affects how steadily your heart beats. Potassium also helps control your blood pressure and keep your muscles and nervous system healthy. Certain health conditions and medicines may change the balance of potassium in your body. When this happens, you can help balance your level of potassium through the foods that you do or do not eat. Your health care provider or dietitian may recommend an amount  of potassium that you should have each day. The following lists of foods provide the amount of potassium (in parentheses) per serving in each item. HIGH IN POTASSIUM  The following foods and beverages have 200 mg or more of potassium per serving:  Apricots, 2 raw or 5 dry (200 mg).  Artichoke, 1 medium (345 mg).  Avocado, raw,  each (245 mg).  Banana, 1 medium (425 mg).  Beans, lima, or baked beans, canned,  cup (280 mg).  Beans, white, canned,  cup (595 mg).  Beef roast, 3 oz (320 mg).  Beef, ground, 3 oz (270 mg).  Beets, raw or cooked,  cup (260 mg).  Bran muffin, 2 oz (300 mg).  Broccoli,  cup (230 mg).  Brussels sprouts,  cup (250 mg).  Cantaloupe,  cup (215 mg).  Cereal, 100% bran,  cup (200-400 mg).  Cheeseburger, single, fast food, 1 each (225-400 mg).  Chicken, 3 oz (220 mg).  Clams, canned, 3 oz (535 mg).  Crab, 3 oz (225 mg).  Dates, 5 each (270 mg).  Dried beans and peas,  cup (300-475 mg).  Figs, dried, 2 each (260 mg).  Fish: halibut, tuna, cod, snapper, 3 oz (480 mg).  Fish: salmon, haddock, swordfish, perch, 3 oz (300 mg).  Fish, tuna,  canned 3 oz (200 mg).  [place]ama[place] fries, fast food, 3 oz (470 mg).  Granola with fruit and nuts,  cup (200 mg).  Grapefruit juice,  cup (200 mg).  Greens, beet,  cup (655 mg).  Honeydew melon,  cup (200 mg).  Kale, raw, 1 cup (300 mg).  Kiwi, 1 medium (240 mg).  Kohlrabi, rutabaga, parsnips,  cup (280 mg).  Lentils,  cup (365 mg).  Mango, 1 each (325 mg).  Milk, chocolate, 1 cup (420 mg).  Milk: nonfat, low-fat, whole, buttermilk, 1 cup (350-380 mg).  Molasses, 1 Tbsp (295 mg).  Mushrooms,  cup (280) mg.  Nectarine, 1 each (275 mg).  Nuts: almonds, peanuts, hazelnuts, [place], cashew, mixed, 1 oz (200 mg).  Nuts, pistachios, 1 oz (295 mg).  Orange, 1 each (240 mg).  Orange juice,  cup (235 mg).  Papaya, medium,  fruit (390 mg).  Peanut butter, chunky, 2 Tbsp (240  mg).  Peanut butter, smooth, 2 Tbsp (210 mg).  Pear, 1 medium (200 mg).  Pomegranate, 1 whole (400 mg).  Pomegranate juice,  cup (215 mg).  Pork, 3 oz (350 mg).  Potato chips, salted, 1 oz (465 mg).  Potato, baked with skin, 1 medium (925 mg).  Potatoes, boiled,  cup (255 mg).  Potatoes, mashed,  cup (330 mg).  Prune juice,  cup (370 mg).  Prunes, 5 each (305 mg).  Pudding, chocolate,  cup (230 mg).  Pumpkin, canned,  cup (250 mg).  Raisins, seedless,  cup (270 mg).  Seeds, sunflower or pumpkin, 1 oz (240 mg).  Soy milk, 1 cup (300 mg).  Spinach,  cup (420 mg).  Spinach, canned,  cup (370 mg).  Sweet potato, baked with skin, 1 medium (450 mg).  Swiss chard,  cup (480 mg).  Tomato or vegetable juice,  cup (275 mg).  Tomato sauce or puree,  cup (400-550 mg).  Tomato, raw, 1 medium (290 mg).  Tomatoes, canned,  cup (200-300 mg).  [place], 3 oz (250 mg).  Wheat germ, 1 oz (250 mg).  Winter squash,  cup (250 mg).  Yogurt, plain or fruited, 6 oz (260-435 mg).  Zucchini,  cup (220 mg). MODERATE IN POTASSIUM The following foods and beverages have 50-200 mg of potassium per serving:  Apple, 1 each (150 mg).  Apple juice,  cup (150 mg).  Applesauce,  cup (90 mg).  Apricot nectar,  cup (140 mg).  Asparagus, small spears,  cup or 6 spears (155 mg).  Bagel, cinnamon raisin, 1 each (130 mg).  Bagel, egg or plain, 4 in., 1 each (70 mg).  Beans, green,  cup (90 mg).  Beans, yellow,  cup (190 mg).  Beer, regular, 12 oz (100 mg).  Beets, canned,  cup (125 mg).  Blackberries,  cup (115 mg).  Blueberries,  cup (60 mg).  Bread, whole wheat, 1 slice (70 mg).  Broccoli, raw,  cup (145 mg).  Cabbage,  cup (150 mg).  Carrots, cooked or raw,  cup (180 mg).  Cauliflower, raw,  cup (150 mg).  Celery, raw,  cup (155 mg).  Cereal, bran flakes, cup (120-150 mg).  Cheese, cottage,  cup (110 mg).  Cherries, 10 each  (150 mg).  Chocolate, 1 oz bar (165 mg).  Coffee, brewed 6 oz (90 mg).  Corn,  cup or 1 ear (195 mg).  Cucumbers,  cup (80 mg).  Egg, large, 1 each (60 mg).  Eggplant,  cup (60 mg).  Endive, raw, cup (80 mg).  English muffin, 1  each (65 mg).  Fish, orange roughy, 3 oz (150 mg).  Frankfurter, beef or pork, 1 each (75 mg).  Fruit cocktail,  cup (115 mg).  Grape juice,  cup (170 mg).  Grapefruit,  fruit (175 mg).  Grapes,  cup (155 mg).  Greens: kale, turnip, collard,  cup (110-150 mg).  Ice cream or frozen yogurt, chocolate,  cup (175 mg).  Ice cream or frozen yogurt, vanilla,  cup (120-150 mg).  Lemons, limes, 1 each (80 mg).  Lettuce, all types, 1 cup (100 mg).  Mixed vegetables,  cup (150 mg).  Mushrooms, raw,  cup (110 mg).  Nuts: walnuts, pecans, or macadamia, 1 oz (125 mg).  Oatmeal,  cup (80 mg).  Okra,  cup (110 mg).  Onions, raw,  cup (120 mg).  Peach, 1 each (185 mg).  Peaches, canned,  cup (120 mg).  Pears, canned,  cup (120 mg).  Peas, green, frozen,  cup (90 mg).  Peppers, green,  cup (130 mg).  Peppers, red,  cup (160 mg).  Pineapple juice,  cup (165 mg).  Pineapple, fresh or canned,  cup (100 mg).  Plums, 1 each (105 mg).  Pudding, vanilla,  cup (150 mg).  Raspberries,  cup (90 mg).  Rhubarb,  cup (115 mg).  Rice, wild,  cup (80 mg).  Shrimp, 3 oz (155 mg).  Spinach, raw, 1 cup (170 mg).  Strawberries,  cup (125 mg).  Summer squash  cup (175-200 mg).  Swiss chard, raw, 1 cup (135 mg).  Tangerines, 1 each (140 mg).  Tea, brewed, 6 oz (65 mg).  Turnips,  cup (140 mg).  Watermelon,  cup (85 mg).  Wine, red, table, 5 oz (180 mg).  Wine, white, table, 5 oz (100 mg). LOW IN POTASSIUM The following foods and beverages have less than 50 mg of potassium per serving.  Bread, white, 1 slice (30 mg).  Carbonated beverages, 12 oz (less than 5 mg).  Cheese, 1 oz (20-30  mg).  Cranberries,  cup (45 mg).  Cranberry juice cocktail,  cup (20 mg).  Fats and oils, 1 Tbsp (less than 5 mg).  Hummus, 1 Tbsp (32 mg).  Nectar: papaya, mango, or pear,  cup (35 mg).  Rice, white or brown,  cup (50 mg).  Spaghetti or macaroni,  cup cooked (30 mg).  Tortilla, flour or corn, 1 each (50 mg).  Waffle, 4 in., 1 each (50 mg).  Water chestnuts,  cup (40 mg).   This information is not intended to replace advice given to you by your health care provider. Make sure you discuss any questions you have with your health care provider.   Document Released: 01/29/2005 Document Revised: 06/22/2013 Document Reviewed: 05/14/2013 Elsevier Interactive Patient Education [employer]2016 [employer]vier Inc.

## 2016-03-11 ENCOUNTER — Emergency Department
Admission: EM | Admit: 2016-03-11 | Discharge: 2016-03-12 | Disposition: A | Discharge status: Home / Self Care | Payer: Medicare Other | Attending: Emergency Medicine | Admitting: Emergency Medicine

## 2016-03-11 ENCOUNTER — Emergency Department: Payer: Medicare Other

## 2016-03-11 DIAGNOSIS — Z79899 Other long term (current) drug therapy: Secondary | ICD-10-CM | POA: Insufficient documentation

## 2016-03-11 DIAGNOSIS — Z21 Asymptomatic human immunodeficiency virus [HIV] infection status: Secondary | ICD-10-CM | POA: Insufficient documentation

## 2016-03-11 DIAGNOSIS — K860 Alcohol-induced chronic pancreatitis: Secondary | ICD-10-CM | POA: Insufficient documentation

## 2016-03-11 DIAGNOSIS — J449 Chronic obstructive pulmonary disease, unspecified: Secondary | ICD-10-CM | POA: Insufficient documentation

## 2016-03-11 DIAGNOSIS — I10 Essential (primary) hypertension: Secondary | ICD-10-CM | POA: Insufficient documentation

## 2016-03-11 DIAGNOSIS — F17210 Nicotine dependence, cigarettes, uncomplicated: Secondary | ICD-10-CM | POA: Diagnosis not present

## 2016-03-11 DIAGNOSIS — R109 Unspecified abdominal pain: Secondary | ICD-10-CM

## 2016-03-11 DIAGNOSIS — R1011 Right upper quadrant pain: Secondary | ICD-10-CM | POA: Diagnosis present

## 2016-03-11 LAB — CBC
HEMATOCRIT: 40.7 % (ref 40.0–52.0)
HEMOGLOBIN: 14.3 g/dL (ref 13.0–18.0)
MCH: 31.8 pg (ref 26.0–34.0)
MCHC: 35.1 g/dL (ref 32.0–36.0)
MCV: 90.5 fL (ref 80.0–100.0)
Platelets: 281 10*3/uL (ref 150–440)
RBC: 4.5 MIL/uL (ref 4.40–5.90)
RDW: 14.1 % (ref 11.5–14.5)
WBC: 5.9 10*3/uL (ref 3.8–10.6)

## 2016-03-11 LAB — COMPREHENSIVE METABOLIC PANEL
ALT: 15 U/L — ABNORMAL LOW (ref 17–63)
ANION GAP: 9 (ref 5–15)
AST: 28 U/L (ref 15–41)
Albumin: 4.1 g/dL (ref 3.5–5.0)
Alkaline Phosphatase: 78 U/L (ref 38–126)
BUN: 10 mg/dL (ref 6–20)
CHLORIDE: 104 mmol/L (ref 101–111)
CO2: 27 mmol/L (ref 22–32)
Calcium: 9.5 mg/dL (ref 8.9–10.3)
Creatinine, Ser: 0.91 mg/dL (ref 0.61–1.24)
GFR calc Af Amer: >60 mL/min (ref >60)
Glucose, Bld: 142 mg/dL — ABNORMAL HIGH (ref 65–99)
POTASSIUM: 3 mmol/L — AB (ref 3.5–5.1)
Sodium: 140 mmol/L (ref 135–145)
TOTAL PROTEIN: 7.2 g/dL (ref 6.5–8.1)
Total Bilirubin: 0.6 mg/dL (ref 0.3–1.2)

## 2016-03-11 LAB — LIPASE, BLOOD: LIPASE: 52 U/L — AB (ref 11–51)

## 2016-03-11 LAB — LACTIC ACID, PLASMA: Lactic Acid, Venous: 2.9 mmol/L (ref 0.5–1.9)

## 2016-03-11 MED ORDER — SODIUM CHLORIDE 0.9 % IV BOLUS (SEPSIS)
1000.0000 mL | Freq: Once | INTRAVENOUS | Status: AC
  Administered 2016-03-11: 1000 mL via INTRAVENOUS

## 2016-03-11 MED ORDER — MORPHINE SULFATE (PF) 4 MG/ML IV SOLN
4.0000 mg | Freq: Once | INTRAVENOUS | Status: AC
  Administered 2016-03-11: 4 mg via INTRAVENOUS
  Filled 2016-03-11: qty 1

## 2016-03-11 MED ORDER — IOPAMIDOL (ISOVUE-370) INJECTION 76%
100.0000 mL | Freq: Once | INTRAVENOUS | Status: AC | PRN
  Administered 2016-03-11: 100 mL via INTRAVENOUS

## 2016-03-11 MED ORDER — ONDANSETRON HCL 4 MG/2ML IJ SOLN
4.0000 mg | Freq: Once | INTRAMUSCULAR | Status: AC
  Administered 2016-03-11: 4 mg via INTRAVENOUS
  Filled 2016-03-11: qty 2

## 2016-03-11 NOTE — ED Provider Notes (Signed)
[facility] Emergency Department Provider Note  ____________________________________________  Time seen: Approximately 9:35 PM  I have reviewed the triage vital signs and the nursing notes.   HISTORY  Chief Complaint Abdominal Pain   HPI Mason Martin is a 50 y.o. male history of inferior mesenteric vein thrombosis, COPD, HIV, hypertension, chronic pancreatitis, alcohol abuse who presents for evaluation of abdominal pain. Patient reports the pain started Saturday. The pain is intermittent, cramping, located in the right upper quadrant/epigastric region, radiating to his back, associated with multiple episodes of watery diarrhea. No melena, no bright red blood per rectum. Patient reports that today started having nonbloody nonbilious emesis and has had multiple episodes. Reports that he last drank alcohol Saturday before the symptoms started. He reports that he feels similar to his prior flares of pancreatitis. He also endorses chills, denies hematuria, dysuria, flank pain, chest pain, shortness of breath.  Past Medical History:  Diagnosis Date  . 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 COPD (chronic obstructive pulmonary disease) (HCC)   . GERD (gastroesophageal reflux disease)   . History of meningitis   . HIV disease (HCC)    on HAART, follows with ID at [facility]  . Hypertension   . Neck pain    with left arm radiculopathy  . Pancreas cyst   . Pancreatitis    chronic pancreatitis  . TIA (transient ischemic attack)   . Tobacco use     Patient Active Problem List   Diagnosis Date Noted  . Acute pancreatitis 07/01/2015    Past Surgical History:  Procedure Laterality Date  . ESOPHAGEAL ATRESIA REPAIR      Prior to Admission medications   Medication Sig Start Date End Date Taking? Authorizing Provider  cetirizine (ZYRTEC) 10 MG tablet Take 10 mg by mouth daily. 04/27/15  Yes Historical Provider, MD  CREON 24000 units CPEP Take  24,000 Units by mouth 3 (three) times daily with meals. 06/22/15  Yes Historical Provider, MD  enalapril (VASOTEC) 20 MG tablet Take 20 mg by mouth daily. 06/22/15  Yes Historical Provider, MD  NEXIUM 40 MG capsule Take 40 mg by mouth daily before breakfast. 06/22/15  Yes Historical Provider, MD  NORVIR 100 MG TABS tablet Take 100 mg by mouth daily. 06/22/15  Yes Historical Provider, MD  PREZISTA 800 MG tablet Take 800 mg by mouth daily with breakfast. 06/22/15  Yes Historical Provider, MD  ranitidine (ZANTAC) 150 MG tablet Take 1 tablet (150 mg total) by mouth 2 (two) times daily. 12/19/15 12/18/16 Yes Myrna Blazer, MD  sucralfate (CARAFATE) 1 g tablet Take 1 tablet (1 g total) by mouth 4 (four) times daily as needed (for abdominal discomfort, nausea, and/or vomiting). 12/23/15  Yes Loleta Rose, MD  TIVICAY 50 MG tablet Take 50 mg by mouth daily. 06/22/15  Yes Historical Provider, MD  triamcinolone cream (KENALOG) 0.5 % Apply 1 application topically 2 (two) times daily. 06/22/15  Yes Historical Provider, MD  VENTOLIN HFA 108 (90 Base) MCG/ACT inhaler Inhale 2 puffs into the lungs 4 (four) times daily as needed. For shortness of breath and/or wheezing. 06/22/15  Yes Historical Provider, MD  ondansetron (ZOFRAN ODT) 4 MG disintegrating tablet Take 1 tablet (4 mg total) by mouth every 8 (eight) hours as needed for nausea or vomiting. 03/12/16   Darci Current, MD  oxyCODONE-acetaminophen (ROXICET) 5-325 MG tablet Take 1 tablet by mouth every 4 (four) hours as needed for severe pain. 03/12/16  Darci Current, MD    Allergies Sulfur  Family History  Problem Relation Age of Onset  . Heart failure Father   . Emphysema Father     Social History Social History  Substance Use Topics  . Smoking status: Current Every Day Smoker    Packs/day: 0.50    Types: Cigarettes  . Smokeless tobacco: Never Used  . Alcohol use 0.0 oz/week     Comment: 3 beers last night    Review of  Systems  Constitutional: Negative for fever. Eyes: Negative for visual changes. ENT: Negative for sore throat. Cardiovascular: Negative for chest pain. Respiratory: Negative for shortness of breath. Gastrointestinal: + epigastric abdominal pain, vomiting and diarrhea. Genitourinary: Negative for dysuria. Musculoskeletal: Negative for back pain. Skin: Negative for rash. Neurological: Negative for headaches, weakness or numbness.  ____________________________________________   PHYSICAL EXAM:  VITAL SIGNS: ED Triage Vitals [03/11/16 2117]  Enc Vitals Group     BP (!) 162/101     Pulse Rate 77     Resp 13     Temp 98.6 F (37 C)     Temp Source Oral     SpO2 98 %     Weight      Height      Head Circumference      Peak Flow      Pain Score 10     Pain Loc      Pain Edu?      Excl. in GC?     Constitutional: Alert and oriented. In moderate distress, actively vomiting and grimacing due to pain.  HEENT:      Head: Normocephalic and atraumatic.         Eyes: Conjunctivae are normal. Sclera is non-icteric. EOMI. PERRL      Mouth/Throat: Mucous membranes are moist.       Neck: Supple with no signs of meningismus. Cardiovascular: Regular rate and rhythm. No murmurs, gallops, or rubs. 2+ symmetrical distal pulses are present in all extremities. No JVD. Respiratory: Normal respiratory effort. Lungs are clear to auscultation bilaterally. No wheezes, crackles, or rhonchi.  Gastrointestinal: Soft, tenderness to palpation of the epigastric and right upper quadrant with localized guarding, remainder of his abdomen is soft and nontender with no rebound or guarding.  Genitourinary: No CVA tenderness. Musculoskeletal: Nontender with normal range of motion in all extremities. No edema, cyanosis, or erythema of extremities. Neurologic: Normal speech and language. Face is symmetric. Moving all extremities. No gross focal neurologic deficits are appreciated. Skin: Skin is warm, dry and  intact. No rash noted. Psychiatric: Mood and affect are normal. Speech and behavior are normal.  ____________________________________________   LABS (all labs ordered are listed, but only abnormal results are displayed)  Labs Reviewed  LIPASE, BLOOD - Abnormal; Notable for the following:       Result Value   Lipase 52 (*)    All other components within normal limits  COMPREHENSIVE METABOLIC PANEL - Abnormal; Notable for the following:    Potassium 3.0 (*)    Glucose, Bld 142 (*)    ALT 15 (*)    All other components within normal limits  URINALYSIS COMPLETEWITH MICROSCOPIC ([facility] ONLY) - Abnormal; Notable for the following:    Color, Urine YELLOW (*)    APPearance CLEAR (*)    Ketones, ur TRACE (*)    Specific Gravity, Urine 1.060 (*)    Bacteria, UA RARE (*)    All other components within normal limits  LACTIC ACID, PLASMA -  Abnormal; Notable for the following:    Lactic Acid, Venous 2.9 (*)    All other components within normal limits  CBC  LACTIC ACID, PLASMA   ____________________________________________  EKG  ED ECG REPORT I, Nita Sicklearolina Yedidya Duddy, the attending physician, personally viewed and interpreted this ECG.  NSR, rate 68, normal intervals, normal axis, no STE or depressions  ____________________________________________  RADIOLOGY  CT a/p: pending  ____________________________________________   PROCEDURES  Procedure(s) performed: None Procedures Critical Care performed:  None ____________________________________________   INITIAL IMPRESSION / ASSESSMENT AND PLAN / ED COURSE  50 y.o. male history of inferior mesenteric vein thrombosis, COPD, HIV, hypertension, chronic pancreatitis, alcohol abuse who presents for evaluation of abdominal pain since Saturday, intermittent cramping, located in the epigastric region and right upper quadrant, associated with diarrhea and vomiting. Patient reports that he feels similar to prior episodes of pancreatitis. He  continues to drink alcohol. Patient is in obvious distress due to the pain, vital signs are within normal limits, he has tenderness with localized guarding in the right upper quadrant and epigastric region. Prior review of Epic showing MRI of his abdomen with no evidence of gallstones in 07/2015. We'll treat symptoms with IV fluids, IV Zofran, IV morphine. We'll check labs including a lactic acid to evaluate for concerns of mesenteric ischemia. Anticipate imaging.  Clinical Course  Lactate was found to be elevated and CT angiogram of abdomen and pelvis was ordered to rule out mesenteric ischemia. Patient remained stable and care was transferred to Dr. Manson PasseyBrown at 11PM with CT pending.  Pertinent labs & imaging results that were available during my care of the patient were reviewed by me and considered in my medical decision making (see chart for details).    ____________________________________________   FINAL CLINICAL IMPRESSION(S) / ED DIAGNOSES  Final diagnoses:  Abdominal pain  Alcohol-induced chronic pancreatitis (HCC)      NEW MEDICATIONS STARTED DURING THIS VISIT:  Discharge Medication List as of 03/12/2016  2:23 AM    START taking these medications   Details  ondansetron (ZOFRAN ODT) 4 MG disintegrating tablet Take 1 tablet (4 mg total) by mouth every 8 (eight) hours as needed for nausea or vomiting., Starting Tue 03/12/2016, Print    oxyCODONE-acetaminophen (ROXICET) 5-325 MG tablet Take 1 tablet by mouth every 4 (four) hours as needed for severe pain., Starting Tue 03/12/2016, Print         Note:  This document was prepared using Dragon voice recognition software and may include unintentional dictation errors.    Nita Sicklearolina Annel Zunker, MD 03/12/16 1248

## 2016-03-11 NOTE — ED Notes (Signed)
Hx pancreatitis, states last flare up was a few months ago.

## 2016-03-11 NOTE — ED Triage Notes (Signed)
Pt presents to ED via [facility] for abdominal pain "all over" and N&V. Pt states that it started 3 days ago, vomiting started today. Pt states he has taken tylenol with no relief.

## 2016-03-11 NOTE — ED Notes (Signed)
Pt reminded of urine sample. States he does not need to urinate now but will let this RN know when he does.

## 2016-03-12 DIAGNOSIS — K860 Alcohol-induced chronic pancreatitis: Secondary | ICD-10-CM | POA: Diagnosis not present

## 2016-03-12 LAB — URINALYSIS COMPLETE WITH MICROSCOPIC (ARMC ONLY)
Bilirubin Urine: NEGATIVE
Glucose, UA: NEGATIVE mg/dL
HGB URINE DIPSTICK: NEGATIVE
LEUKOCYTES UA: NEGATIVE
Nitrite: NEGATIVE
PH: 7 (ref 5.0–8.0)
Protein, ur: NEGATIVE mg/dL
SQUAMOUS EPITHELIAL / LPF: NONE SEEN
Specific Gravity, Urine: 1.06 — ABNORMAL HIGH (ref 1.005–1.030)

## 2016-03-12 LAB — LACTIC ACID, PLASMA: LACTIC ACID, VENOUS: 1.2 mmol/L (ref 0.5–1.9)

## 2016-03-12 MED ORDER — ONDANSETRON 4 MG PO TBDP: 4.0000 mg | ORAL_TABLET | Freq: Three times a day (TID) | ORAL | 0 refills | Status: DC | PRN

## 2016-03-12 MED ORDER — KETOROLAC TROMETHAMINE 30 MG/ML IJ SOLN
INTRAMUSCULAR | Status: AC
  Administered 2016-03-12: 30 mg via INTRAVENOUS
  Filled 2016-03-12: qty 1

## 2016-03-12 MED ORDER — SODIUM CHLORIDE 0.9 % IV BOLUS (SEPSIS)
1000.0000 mL | Freq: Once | INTRAVENOUS | Status: AC
  Administered 2016-03-12: 1000 mL via INTRAVENOUS

## 2016-03-12 MED ORDER — KETOROLAC TROMETHAMINE 10 MG PO TABS
10.0000 mg | ORAL_TABLET | Freq: Once | ORAL | Status: AC
Start: 2016-03-12 — End: 2016-03-12
  Administered 2016-03-12: 10 mg via ORAL
  Filled 2016-03-12: qty 1

## 2016-03-12 MED ORDER — OXYCODONE-ACETAMINOPHEN 5-325 MG PO TABS: 1.0000 | ORAL_TABLET | ORAL | 0 refills | Status: DC | PRN

## 2016-03-12 MED ORDER — KETOROLAC TROMETHAMINE 30 MG/ML IJ SOLN
30.0000 mg | Freq: Once | INTRAMUSCULAR | Status: AC
  Administered 2016-03-12: 30 mg via INTRAVENOUS

## 2016-03-12 MED ORDER — POTASSIUM CHLORIDE 20 MEQ PO PACK
40.0000 meq | PACK | Freq: Two times a day (BID) | ORAL | Status: DC
  Administered 2016-03-12: 40 meq via ORAL
  Filled 2016-03-12: qty 2

## 2016-03-12 NOTE — ED Provider Notes (Signed)
I assumed care of the patient at 11:30 PM from Dr. Don PerkingVeronese. Patient with history of inferior mesenteric vein thrombosis COPD HIV and chronic pancreatitis with current alcohol abuse presents to the emergency department for evaluation of abdominal pain. CT angiogram of the abdomen revealed: Study Result   CLINICAL DATA:  Diffuse abdominal pain with nausea and vomiting. Pain onset 3 days prior, vomiting today. History of mesenteric venous thrombosis and pancreatitis. Elevated lactate.  EXAM: CTA ABDOMEN AND PELVIS wITHOUT AND WITH CONTRAST  TECHNIQUE: Multidetector CT imaging of the abdomen and pelvis was performed using the standard protocol during bolus administration of intravenous contrast. Multiplanar reconstructed images and MIPs were obtained and reviewed to evaluate the vascular anatomy.  CONTRAST:  100 cc Isovue 370 IV  COMPARISON:  Most recent CT 12/19/2015, prior MRI 07/04/2015.  FINDINGS: VASCULAR  Aorta: Normal caliber aorta without aneurysm, dissection, vasculitis or significant stenosis. Mild calcified atherosclerosis.  Celiac: Patent without evidence of aneurysm, dissection, vasculitis or significant stenosis.  SMA: Patent without evidence of aneurysm, dissection, vasculitis or significant stenosis.  Renals: Both renal arteries are patent without evidence of aneurysm, dissection, vasculitis, fibromuscular dysplasia or significant stenosis.  IMA: Patent without evidence of aneurysm, dissection, vasculitis or significant stenosis.  Inflow: Patent without evidence of aneurysm, dissection, vasculitis or significant stenosis. Scattered atherosclerosis.  Proximal Outflow: Bilateral common femoral and visualized portions of the superficial and profunda femoral arteries are patent without evidence of aneurysm, dissection, vasculitis or significant stenosis.  Veins: Portal, splenic, and superior mesenteric veins appear patent. No evidence of  mesenteric thrombus. No portal venous or mesenteric gas.  Review of the MIP images confirms the above findings.  NON-VASCULAR  Lower chest: Mild dependent atelectasis at the lung bases. No pleural fluid. Mild central bronchial thickening linear atelectasis in the left lower lobe.  Hepatobiliary: Tiny subcentimeter hypodensity in the periphery of segment 5/8 is unchanged, too small to characterize. No suspicious focal lesion. Gallbladder physiologically distended, no calcified stone. No biliary dilatation.  Pancreas: Low density in the pancreatic head appears similar to prior exam. No well-defined pseudocyst identified by CT. Questionable mild adjacent soft tissue edema, suboptimally assessed due to paucity of intra-abdominal fat. No ductal dilatation or pancreatic calcifications.  Spleen: 10 mm cyst in the inferior spleen.  No splenomegaly.  Adrenals/Urinary Tract: No adrenal nodule. Symmetric renal enhancement. No hydronephrosis or perinephric edema. Bladder is distended, no back pressure change.  Stomach/Bowel: Stomach distended with ingested contents. Prominent gastric folds are noted. Small bowel is fluid-filled proximally. No perienteric inflammation. Moderate diffuse stool burden throughout the colon. No bowel obstruction. No pneumatosis. The appendix is normal.  Lymphatic: Small calcified porta hepatis node. No evidence of additional abdominal or pelvic adenopathy.  Reproductive: Normal sized prostate gland.  Musculoskeletal: There are no acute or suspicious osseous abnormalities.  Other: No ascites or free air.  IMPRESSION: VASCULAR  No acute abnormality. Specifically, no evidence of mesenteric venous thrombosis. Mild atherosclerosis of the abdominal aorta without acute arterial abnormality.  NON-VASCULAR  Ill-defined low density in the pancreatic head appears similar to prior exam. This may be sequela of prior pseudocyst, a  well-defined fluid collection is not seen. Difficult to evaluate for adjacent peripancreatic edema given paucity of intra-abdominal fat.  Probable gastric fold thickening, stomach distended with ingested contents. Proximal small bowel is fluid-filled, possibly related to ingested material.   Electronically Signed   By: Rubye OaksMelanie  Ehinger M.D.   On: 03/11/2016 23:45    Patient reevaluated at this time appearing in no distress.  Regarding patient's elevated lactic acid which was initially 2.9 after IV hydration lactic acid repeated and was normal at 1.2   Darci Current, MD 03/12/16 [phone]

## 2016-04-19 ENCOUNTER — Encounter: Payer: Self-pay | Admitting: Emergency Medicine

## 2016-04-19 ENCOUNTER — Emergency Department
Admission: EM | Admit: 2016-04-19 | Discharge: 2016-04-19 | Disposition: A | Discharge status: Home / Self Care | Payer: Medicare Other | Attending: Emergency Medicine | Admitting: Emergency Medicine

## 2016-04-19 DIAGNOSIS — F17210 Nicotine dependence, cigarettes, uncomplicated: Secondary | ICD-10-CM | POA: Insufficient documentation

## 2016-04-19 DIAGNOSIS — I10 Essential (primary) hypertension: Secondary | ICD-10-CM | POA: Diagnosis not present

## 2016-04-19 DIAGNOSIS — R350 Frequency of micturition: Secondary | ICD-10-CM

## 2016-04-19 DIAGNOSIS — Z21 Asymptomatic human immunodeficiency virus [HIV] infection status: Secondary | ICD-10-CM | POA: Diagnosis not present

## 2016-04-19 DIAGNOSIS — J449 Chronic obstructive pulmonary disease, unspecified: Secondary | ICD-10-CM | POA: Diagnosis not present

## 2016-04-19 DIAGNOSIS — Z79899 Other long term (current) drug therapy: Secondary | ICD-10-CM | POA: Insufficient documentation

## 2016-04-19 DIAGNOSIS — R300 Dysuria: Secondary | ICD-10-CM | POA: Insufficient documentation

## 2016-04-19 LAB — URINALYSIS COMPLETE WITH MICROSCOPIC (ARMC ONLY)
Bilirubin Urine: NEGATIVE
Glucose, UA: NEGATIVE mg/dL
HGB URINE DIPSTICK: NEGATIVE
Ketones, ur: NEGATIVE mg/dL
Nitrite: NEGATIVE
PH: 5 (ref 5.0–8.0)
PROTEIN: 30 mg/dL — AB
Specific Gravity, Urine: 1.024 (ref 1.005–1.030)

## 2016-04-19 MED ORDER — CIPROFLOXACIN HCL 500 MG PO TABS: 500.0000 mg | ORAL_TABLET | Freq: Two times a day (BID) | ORAL | 0 refills | Status: AC

## 2016-04-19 NOTE — ED Notes (Signed)
See triage  note  Having pain and burning with urination for a few days

## 2016-04-19 NOTE — ED Provider Notes (Signed)
[facility] Emergency Department Provider Note  ____________________________________________  Time seen: Approximately 11:20 AM  I have reviewed the triage vital signs and the nursing notes.   HISTORY  Chief Complaint Urinary Frequency    HPI Mason Martin is a 50 y.o. male, NAD, presents to emergency with 3 day history of dysuria, increased urinary frequency. Patient states he saw his infectious disease specialist yesterday to be evaluated for STDs. Was tested for gonorrhea, chlamydia, syphilis, HIV and all were negative. He received a phone call with the negative results today and was encouraged to come to the hospital to be assessed for UTI. Patient states he has not seen any urethral discharge, hematuria. Denies any testicular or penile pain. Has not had any symptoms of pressure in the rectal region nor has any sensation of sitting on a tennis ball. Denies any history of prostatitis. Denies saddle paresthesias no loss of bowel or bladder control. Has not noted any rashes or skin sores. Has not had any fevers, chills, body aches. Denies any abdominal pain, nausea or vomiting. Has had no chest pain or shortness of breath.   Past Medical History:  Diagnosis Date  . 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 COPD (chronic obstructive pulmonary disease) (HCC)   . GERD (gastroesophageal reflux disease)   . History of meningitis   . HIV disease (HCC)    on HAART, [facility]  . Hypertension   . Neck pain    with left arm radiculopathy  . Pancreas cyst   . Pancreatitis    chronic pancreatitis  . TIA (transient ischemic attack)   . Tobacco use     Patient Active Problem List   Diagnosis Date Noted  . Acute pancreatitis 07/01/2015    Past Surgical History:  Procedure Laterality Date  . ESOPHAGEAL ATRESIA REPAIR      Prior to Admission medications   Medication Sig Start Date End Date Taking? Authorizing Provider  cetirizine  (ZYRTEC) 10 MG tablet Take 10 mg by mouth daily. 04/27/15   Historical Provider, MD  ciprofloxacin (CIPRO) 500 MG tablet Take 1 tablet (500 mg total) by mouth 2 (two) times daily. 04/19/16 04/26/16  Orva Gwaltney L Meda Dudzinski, PA-C  CREON 24000 units CPEP Take 24,000 Units by mouth 3 (three) times daily with meals. 06/22/15   Historical Provider, MD  enalapril (VASOTEC) 20 MG tablet Take 20 mg by mouth daily. 06/22/15   Historical Provider, MD  NEXIUM 40 MG capsule Take 40 mg by mouth daily before breakfast. 06/22/15   Historical Provider, MD  NORVIR 100 MG TABS tablet Take 100 mg by mouth daily. 06/22/15   Historical Provider, MD  PREZISTA 800 MG tablet Take 800 mg by mouth daily with breakfast. 06/22/15   Historical Provider, MD  TIVICAY 50 MG tablet Take 50 mg by mouth daily. 06/22/15   Historical Provider, MD  triamcinolone cream (KENALOG) 0.5 % Apply 1 application topically 2 (two) times daily. 06/22/15   Historical Provider, MD  VENTOLIN HFA 108 (90 Base) MCG/ACT inhaler Inhale 2 puffs into the lungs 4 (four) times daily as needed. For shortness of breath and/or wheezing. 06/22/15   Historical Provider, MD    Allergies Sulfur  Family History  Problem Relation Age of Onset  . Heart failure Father   . Emphysema Father     Social History Social History  Substance Use Topics  . Smoking status: Current Every Day Smoker    Packs/day:  0.50    Types: Cigarettes  . Smokeless tobacco: Never Used  . Alcohol use 0.0 oz/week     Comment: 3 beers last night     Review of Systems  Constitutional: No fever/chills Cardiovascular: No chest pain. Respiratory:  No shortness of breath.  Gastrointestinal: No abdominal pain.  No nausea, vomiting.  No rectal pressure or pain. Genitourinary: Positive for dysuria, increased urinary frequency. No hematuria, urethral discharge. No urinary hesitancy, urgency. No penile or testicular pain. Musculoskeletal: Negative for back pain.  Skin: Negative for rash,  redness, swelling, skin sores. Neurological: Negative for saddle paresthesias or loss of bowel or bladder control. 10-point ROS otherwise negative.  ____________________________________________   PHYSICAL EXAM:  VITAL SIGNS: ED Triage Vitals [04/19/16 1020]  Enc Vitals Group     BP (!) 141/99     Pulse Rate 73     Resp 18     Temp 98.2 F (36.8 C)     Temp Source Oral     SpO2 100 %     Weight 140 lb (63.5 kg)     Height      Head Circumference      Peak Flow      Pain Score 1     Pain Loc      Pain Edu?      Excl. in GC?      Constitutional: Alert and oriented. Well appearing and in no acute distress. Eyes: Conjunctivae are normal. PERRL. EOMI without pain.  Head: Atraumatic. Neck: Supple with full range of motion Hematological/Lymphatic/Immunilogical: No cervical lymphadenopathy. Cardiovascular: Normal rate, regular rhythm. Normal S1 and S2.  Good peripheral circulation. Respiratory: Normal respiratory effort without tachypnea or retractions. Lungs CTAB with breath sounds noted in all lung fields. No wheeze, rhonchi, rales. Gastrointestinal: Soft and nontender without distention or guarding in all quadrants. No rebound or rigidity. Bowel sounds are grossly normal active in all quadrants. No CVA tenderness. Musculoskeletal: No lower extremity tenderness nor edema.  No joint effusions. Neurologic:  Normal speech and language. No gross focal neurologic deficits are appreciated.  Skin:  Skin is warm, dry and intact. No rash noted. Psychiatric: Mood and affect are normal. Speech and behavior are normal. Patient exhibits appropriate insight and judgement.   ____________________________________________   LABS (all labs ordered are listed, but only abnormal results are displayed)  Labs Reviewed  URINALYSIS COMPLETEWITH MICROSCOPIC ([facility] ONLY) - Abnormal; Notable for the following:       Result Value   Color, Urine YELLOW (*)    APPearance CLEAR (*)    Protein, ur 30  (*)    Leukocytes, UA TRACE (*)    Bacteria, UA RARE (*)    Squamous Epithelial / LPF 0-5 (*)    All other components within normal limits  URINE CULTURE   ____________________________________________  EKG  None ____________________________________________  RADIOLOGY  None ____________________________________________    PROCEDURES  Procedure(s) performed: None   Procedures   Medications - No data to display   ____________________________________________   INITIAL IMPRESSION / ASSESSMENT AND PLAN / ED COURSE  Pertinent labs & imaging results that were available during my care of the patient were reviewed by me and considered in my medical decision making (see chart for details).  Clinical Course    Patient's diagnosis is consistent with Dysuria and urinary frequency likely caused by urinary tract infection. Patient will be discharged home with prescriptions for Cipro to take as directed. Urine culture was sent to the lab and we will  call patient with results when available in 48-72 hours. Patient is to follow up with his primary care provider or [facility] if symptoms persist past this treatment course. Patient is given ED precautions to return to the ED for any worsening or new symptoms.    ____________________________________________  FINAL CLINICAL IMPRESSION(S) / ED DIAGNOSES  Final diagnoses:  Dysuria  Urinary frequency      NEW MEDICATIONS STARTED DURING THIS VISIT:  Discharge Medication List as of 04/19/2016 11:33 AM    START taking these medications   Details  ciprofloxacin (CIPRO) 500 MG tablet Take 1 tablet (500 mg total) by mouth 2 (two) times daily., Starting Fri 04/19/2016, Until Fri 04/26/2016, Print             Hope Pigeon, PA-C 04/19/16 1240    Sharyn Creamer, MD 04/19/16 [phone]

## 2016-04-19 NOTE — ED Triage Notes (Signed)
Pt to ed with c/o urinary frequency and burning with urination.

## 2016-04-19 NOTE — ED Notes (Signed)
Called patient to room patient not in waiting room, will call again, patient's belonging in chair

## 2016-04-20 LAB — URINE CULTURE: Special Requests: NORMAL

## 2016-04-24 ENCOUNTER — Encounter: Payer: Self-pay | Admitting: Emergency Medicine

## 2016-04-24 ENCOUNTER — Emergency Department
Admission: EM | Admit: 2016-04-24 | Discharge: 2016-04-24 | Disposition: A | Discharge status: Home / Self Care | Payer: Medicare Other | Attending: Emergency Medicine | Admitting: Emergency Medicine

## 2016-04-24 DIAGNOSIS — F17210 Nicotine dependence, cigarettes, uncomplicated: Secondary | ICD-10-CM | POA: Insufficient documentation

## 2016-04-24 DIAGNOSIS — Z21 Asymptomatic human immunodeficiency virus [HIV] infection status: Secondary | ICD-10-CM | POA: Insufficient documentation

## 2016-04-24 DIAGNOSIS — I10 Essential (primary) hypertension: Secondary | ICD-10-CM | POA: Diagnosis not present

## 2016-04-24 DIAGNOSIS — J449 Chronic obstructive pulmonary disease, unspecified: Secondary | ICD-10-CM | POA: Insufficient documentation

## 2016-04-24 DIAGNOSIS — H1033 Unspecified acute conjunctivitis, bilateral: Secondary | ICD-10-CM | POA: Insufficient documentation

## 2016-04-24 DIAGNOSIS — L02415 Cutaneous abscess of right lower limb: Secondary | ICD-10-CM | POA: Insufficient documentation

## 2016-04-24 DIAGNOSIS — Z79899 Other long term (current) drug therapy: Secondary | ICD-10-CM | POA: Diagnosis not present

## 2016-04-24 DIAGNOSIS — H578 Other specified disorders of eye and adnexa: Secondary | ICD-10-CM | POA: Diagnosis present

## 2016-04-24 MED ORDER — OLOPATADINE HCL 0.2 % OP SOLN: 1.0000 [drp] | Freq: Every day | OPHTHALMIC | 0 refills | Status: DC

## 2016-04-24 MED ORDER — CIPROFLOXACIN HCL 0.3 % OP SOLN: 1.0000 [drp] | OPHTHALMIC | 0 refills | Status: AC

## 2016-04-24 NOTE — ED Provider Notes (Signed)
[facility] Emergency Department Provider Note ____________________________________________  Time seen: Approximately 8:51 AM  I have reviewed the triage vital signs and the nursing notes.   HISTORY  Chief Complaint Conjunctivitis   HPI Mason Martin is a 50 y.o. male who presents to the emergency department for evaluation of bilateral eye irritation, drainage, and matting. He states that his significant other recently had similar symptoms. He denies visual changes or fever. He denies recent injury.  Past Medical History:  Diagnosis Date  . 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 COPD (chronic obstructive pulmonary disease) (HCC)   . GERD (gastroesophageal reflux disease)   . History of meningitis   . HIV disease (HCC)    on HAART, [facility]  . Hypertension   . Neck pain    with left arm radiculopathy  . Pancreas cyst   . Pancreatitis    chronic pancreatitis  . TIA (transient ischemic attack)   . Tobacco use     Patient Active Problem List   Diagnosis Date Noted  . Acute pancreatitis 07/01/2015    Past Surgical History:  Procedure Laterality Date  . ESOPHAGEAL ATRESIA REPAIR      Prior to Admission medications   Medication Sig Start Date End Date Taking? Authorizing Provider  cetirizine (ZYRTEC) 10 MG tablet Take 10 mg by mouth daily. 04/27/15   Historical Provider, MD  ciprofloxacin (CILOXAN) 0.3 % ophthalmic solution Place 1 drop into both eyes every 2 (two) hours. Administer 1 drop, every 2 hours, while awake, for 2 days. Then 1 drop, every 4 hours, while awake, for the next 5 days. 04/24/16 04/29/16  Chinita Pesterari B Kurk Corniel, FNP  ciprofloxacin (CIPRO) 500 MG tablet Take 1 tablet (500 mg total) by mouth 2 (two) times daily. 04/19/16 04/26/16  Jami L Hagler, PA-C  CREON 24000 units CPEP Take 24,000 Units by mouth 3 (three) times daily with meals. 06/22/15   Historical Provider, MD  enalapril (VASOTEC) 20 MG tablet Take  20 mg by mouth daily. 06/22/15   Historical Provider, MD  NEXIUM 40 MG capsule Take 40 mg by mouth daily before breakfast. 06/22/15   Historical Provider, MD  NORVIR 100 MG TABS tablet Take 100 mg by mouth daily. 06/22/15   Historical Provider, MD  Olopatadine HCl 0.2 % SOLN Apply 1 drop to eye daily. 04/24/16   Chinita Pesterari B Shemuel Harkleroad, FNP  PREZISTA 800 MG tablet Take 800 mg by mouth daily with breakfast. 06/22/15   Historical Provider, MD  TIVICAY 50 MG tablet Take 50 mg by mouth daily. 06/22/15   Historical Provider, MD  triamcinolone cream (KENALOG) 0.5 % Apply 1 application topically 2 (two) times daily. 06/22/15   Historical Provider, MD  VENTOLIN HFA 108 (90 Base) MCG/ACT inhaler Inhale 2 puffs into the lungs 4 (four) times daily as needed. For shortness of breath and/or wheezing. 06/22/15   Historical Provider, MD    Allergies Sulfur  Family History  Problem Relation Age of Onset  . Heart failure Father   . Emphysema Father     Social History Social History  Substance Use Topics  . Smoking status: Current Every Day Smoker    Packs/day: 0.50    Types: Cigarettes  . Smokeless tobacco: Never Used  . Alcohol use 0.0 oz/week     Comment: 3 beers last night    Review of Systems   Constitutional: No fever/chills Eyes: Negative for visual changes. Negative for pain. Musculoskeletal:  Negative for pain. Skin: Negative for rash. Neurological: Negative for headaches, focal weakness or numbness. Allergic: Negative for seasonal allergies. ____________________________________________  PHYSICAL EXAM:  VITAL SIGNS: ED Triage Vitals [04/24/16 0835]  Enc Vitals Group     BP (!) 145/88     Pulse Rate 74     Resp 16     Temp 98 F (36.7 C)     Temp Source Oral     SpO2 97 %     Weight 137 lb (62.1 kg)     Height 5\' 5"  (1.651 m)     Head Circumference      Peak Flow      Pain Score      Pain Loc      Pain Edu?      Excl. in GC?     Constitutional: Alert and oriented. Well  appearing and in no acute distress. Eyes: No globe trauma; Eyelids normal to inspection; Sclera appears anicteric.  Eyelids not inverted. Conjunctiva appears injected and erythematous; Cornea clear on non-stained exam. Head: Atraumatic. Nose: No congestion/rhinnorhea. Mouth/Throat: Mucous membranes are moist.  Oropharynx non-erythematous. Respiratory: No cough Musculoskeletal:Normal ROM x 4 extremities. Neurologic:  Normal speech and language. No gross focal neurologic deficits are appreciated. Speech is normal. No gait instability. Skin:  Skin is warm, dry and intact. No rash noted. Psychiatric: Mood and affect are normal. Speech and behavior are normal.  ____________________________________________   LABS (all labs ordered are listed, but only abnormal results are displayed)  Labs Reviewed - No data to display ____________________________________________  EKG  Not indicated. ____________________________________________  RADIOLOGY  None ____________________________________________   PROCEDURES   ____________________________________________   INITIAL IMPRESSION / ASSESSMENT AND PLAN / ED COURSE  Pertinent labs & imaging results that were available during my care of the patient were reviewed by me and considered in my medical decision making (see chart for details).  Clinical Course   Patient received prescriptions for Cipro drops and Pataday. He is to follow up with his ophthalmologist at [facility] for symptoms that are not improving over the next few days.  He was advised to return to the ER for symptoms that change or worsen if unable to schedule an appointment.   ____________________________________________   FINAL CLINICAL IMPRESSION(S) / ED DIAGNOSES  Final diagnoses:  Acute conjunctivitis of both eyes, unspecified acute conjunctivitis type    Note:  This document was prepared using Dragon voice recognition software and may include unintentional dictation  errors.    Chinita Pester, FNP 04/24/16 1417    Chinita Pester, FNP 04/24/16 1454    Sharman Cheek, MD 04/24/16 1515

## 2016-04-24 NOTE — ED Triage Notes (Signed)
Pt with red, itchy watery eyes that were crusty this am. Symptoms started 2 days ago.

## 2016-07-25 ENCOUNTER — Emergency Department
Admission: EM | Admit: 2016-07-25 | Discharge: 2016-07-25 | Disposition: A | Discharge status: Home / Self Care | Payer: Medicare Other | Attending: Emergency Medicine | Admitting: Emergency Medicine

## 2016-07-25 ENCOUNTER — Emergency Department: Payer: Medicare Other

## 2016-07-25 ENCOUNTER — Encounter: Payer: Self-pay | Admitting: Emergency Medicine

## 2016-07-25 DIAGNOSIS — Z79899 Other long term (current) drug therapy: Secondary | ICD-10-CM | POA: Diagnosis not present

## 2016-07-25 DIAGNOSIS — G8929 Other chronic pain: Secondary | ICD-10-CM | POA: Diagnosis not present

## 2016-07-25 DIAGNOSIS — J449 Chronic obstructive pulmonary disease, unspecified: Secondary | ICD-10-CM | POA: Diagnosis not present

## 2016-07-25 DIAGNOSIS — R112 Nausea with vomiting, unspecified: Secondary | ICD-10-CM | POA: Diagnosis not present

## 2016-07-25 DIAGNOSIS — F17210 Nicotine dependence, cigarettes, uncomplicated: Secondary | ICD-10-CM | POA: Diagnosis not present

## 2016-07-25 DIAGNOSIS — Z21 Asymptomatic human immunodeficiency virus [HIV] infection status: Secondary | ICD-10-CM | POA: Diagnosis not present

## 2016-07-25 DIAGNOSIS — R1010 Upper abdominal pain, unspecified: Secondary | ICD-10-CM | POA: Diagnosis present

## 2016-07-25 DIAGNOSIS — R109 Unspecified abdominal pain: Secondary | ICD-10-CM

## 2016-07-25 LAB — URINALYSIS, COMPLETE (UACMP) WITH MICROSCOPIC
BILIRUBIN URINE: NEGATIVE
GLUCOSE, UA: NEGATIVE mg/dL
HGB URINE DIPSTICK: NEGATIVE
Ketones, ur: NEGATIVE mg/dL
NITRITE: NEGATIVE
PH: 7 (ref 5.0–8.0)
Protein, ur: NEGATIVE mg/dL
Specific Gravity, Urine: 1.012 (ref 1.005–1.030)

## 2016-07-25 LAB — CBC
HCT: 38.1 % — ABNORMAL LOW (ref 40.0–52.0)
HEMOGLOBIN: 13.4 g/dL (ref 13.0–18.0)
MCH: 31.9 pg (ref 26.0–34.0)
MCHC: 35.1 g/dL (ref 32.0–36.0)
MCV: 90.8 fL (ref 80.0–100.0)
PLATELETS: 270 10*3/uL (ref 150–440)
RBC: 4.19 MIL/uL — AB (ref 4.40–5.90)
RDW: 13.9 % (ref 11.5–14.5)
WBC: 5.3 10*3/uL (ref 3.8–10.6)

## 2016-07-25 LAB — COMPREHENSIVE METABOLIC PANEL
ALT: 11 U/L — ABNORMAL LOW (ref 17–63)
ANION GAP: 8 (ref 5–15)
AST: 22 U/L (ref 15–41)
Albumin: 3.9 g/dL (ref 3.5–5.0)
Alkaline Phosphatase: 69 U/L (ref 38–126)
BUN: 10 mg/dL (ref 6–20)
CALCIUM: 8.7 mg/dL — AB (ref 8.9–10.3)
CO2: 26 mmol/L (ref 22–32)
Chloride: 105 mmol/L (ref 101–111)
Creatinine, Ser: 0.82 mg/dL (ref 0.61–1.24)
Glucose, Bld: 97 mg/dL (ref 65–99)
Potassium: 3.1 mmol/L — ABNORMAL LOW (ref 3.5–5.1)
Sodium: 139 mmol/L (ref 135–145)
Total Bilirubin: 0.4 mg/dL (ref 0.3–1.2)
Total Protein: 7 g/dL (ref 6.5–8.1)

## 2016-07-25 LAB — LIPASE, BLOOD: LIPASE: 37 U/L (ref 11–51)

## 2016-07-25 MED ORDER — MORPHINE SULFATE (PF) 4 MG/ML IV SOLN
INTRAVENOUS | Status: AC
  Filled 2016-07-25: qty 1

## 2016-07-25 MED ORDER — ONDANSETRON HCL 4 MG/2ML IJ SOLN
4.0000 mg | INTRAMUSCULAR | Status: AC
  Administered 2016-07-25: 4 mg via INTRAVENOUS

## 2016-07-25 MED ORDER — OXYCODONE-ACETAMINOPHEN 5-325 MG PO TABS
ORAL_TABLET | ORAL | Status: DC
Start: 2016-07-25 — End: 2016-07-25
  Filled 2016-07-25: qty 1

## 2016-07-25 MED ORDER — ONDANSETRON HCL 4 MG/2ML IJ SOLN
INTRAMUSCULAR | Status: AC
  Filled 2016-07-25: qty 2

## 2016-07-25 MED ORDER — OXYCODONE-ACETAMINOPHEN 5-325 MG PO TABS
2.0000 | ORAL_TABLET | Freq: Once | ORAL | Status: AC
  Administered 2016-07-25: 2 via ORAL
  Filled 2016-07-25: qty 2

## 2016-07-25 MED ORDER — IOPAMIDOL (ISOVUE-300) INJECTION 61%
100.0000 mL | Freq: Once | INTRAVENOUS | Status: AC | PRN
  Administered 2016-07-25: 100 mL via INTRAVENOUS
  Filled 2016-07-25: qty 100

## 2016-07-25 MED ORDER — MORPHINE SULFATE (PF) 4 MG/ML IV SOLN
4.0000 mg | Freq: Once | INTRAVENOUS | Status: AC
Start: 2016-07-25 — End: 2016-07-25
  Administered 2016-07-25: 4 mg via INTRAVENOUS

## 2016-07-25 MED ORDER — IOPAMIDOL (ISOVUE-300) INJECTION 61%
30.0000 mL | Freq: Once | INTRAVENOUS | Status: AC
  Administered 2016-07-25: 30 mL via ORAL
  Filled 2016-07-25: qty 30

## 2016-07-25 NOTE — ED Notes (Signed)
Patient transported to CT 

## 2016-07-25 NOTE — Discharge Instructions (Signed)
As we discussed, your workup today was reassuring.  Though we do not know exactly what is causing your symptoms, it appears that you have no emergent medical condition at this time and that you are safe to go home and follow up as recommended in this paperwork.  You have received a prescription for narcotic pain medicine several days ago and we cannot provide another prescription for the same symptoms.  Please follow-up with your regular doctor at the next available opportunity.  Continue to take whatever medication you have at home including over-the-counter pain medicine and any prescription pain medicine you may still have.  Please return immediately to the Emergency Department if you develop any new or worsening symptoms that concern you.

## 2016-07-25 NOTE — ED Triage Notes (Signed)
Patient presents to the ED via POV with c/o abdominal pain since Saturday. Patient saw PCP on Monday for same complaint. Patient states he was send home with percocet but has already gone through them all. Hx of pancreatitis. Patient texting on phone during assessment.

## 2016-07-25 NOTE — ED Notes (Signed)
Patient finished first bottle of po contrast.

## 2016-07-25 NOTE — ED Provider Notes (Signed)
[facility] Emergency Department Provider Note  ____________________________________________   First MD Initiated Contact with Patient 07/25/16 2014     (approximate)  I have reviewed the triage vital signs and the nursing notes.   HISTORY  Chief Complaint Abdominal Pain    HPI Mason Martin is a 51 y.o. 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  [facility] and says that they did not do anything except give him some pain medication and send him home.  He has used up the Percocet and states that his pain is still present.  He describes it as severe and worse with any movement and with eating.  He feels that it is his usual pancreatitis. He admits to having a few beers over the weekend before the pain started.  The pain medication helped but now it is gone and nothing else helps it feel better.  He denies fever/chils, chest pain, shortness of breath.  He has had some nausea and vomiting.  denies dysuria and diarrhea.  Past Medical History:  Diagnosis Date  . 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 COPD (chronic obstructive pulmonary disease) (HCC)   . GERD (gastroesophageal reflux disease)   . History of meningitis   . HIV disease (HCC)    on HAART, [facility]  . Hypertension   . Neck pain    with left arm radiculopathy  . Pancreas cyst   . Pancreatitis    chronic pancreatitis  . TIA (transient ischemic attack)   . Tobacco use     Patient Active Problem List   Diagnosis Date Noted  . Acute pancreatitis 07/01/2015    Past Surgical History:  Procedure Laterality Date  . ESOPHAGEAL ATRESIA REPAIR      Prior to Admission medications   Medication Sig Start Date End Date  Taking? Authorizing Provider  cetirizine (ZYRTEC) 10 MG tablet Take 10 mg by mouth daily. 04/27/15   Historical Provider, MD  CREON 24000 units CPEP Take 24,000 Units by mouth 3 (three) times daily with meals. 06/22/15   Historical Provider, MD  enalapril (VASOTEC) 20 MG tablet Take 20 mg by mouth daily. 06/22/15   Historical Provider, MD  NEXIUM 40 MG capsule Take 40 mg by mouth daily before breakfast. 06/22/15   Historical Provider, MD  NORVIR 100 MG TABS tablet Take 100 mg by mouth daily. 06/22/15   Historical Provider, MD  Olopatadine HCl 0.2 % SOLN Apply 1 drop to eye daily. 04/24/16   Chinita Pesterari B Triplett, FNP  PREZISTA 800 MG tablet Take 800 mg by mouth daily with breakfast. 06/22/15   Historical Provider, MD  TIVICAY 50 MG tablet Take 50 mg by mouth daily. 06/22/15   Historical Provider, MD  triamcinolone cream (KENALOG) 0.5 % Apply 1 application topically 2 (two) times daily. 06/22/15   Historical Provider, MD  VENTOLIN HFA 108 (90 Base) MCG/ACT inhaler Inhale 2 puffs into the lungs 4 (four) times daily as needed. For shortness of breath and/or wheezing. 06/22/15   Historical Provider, MD    Allergies Sulfur  Family History  Problem Relation Age of Onset  . Heart failure Father   . Emphysema Father     Social History Social  History  Substance Use Topics  . Smoking status: Current Every Day Smoker    Packs/day: 0.50    Types: Cigarettes  . Smokeless tobacco: Never Used  . Alcohol use 0.0 oz/week     Comment: 3 beers last night    Review of Systems Constitutional: No fever/chills Eyes: No visual changes. ENT: No sore throat. Cardiovascular: Denies chest pain. Respiratory: Denies shortness of breath. Gastrointestinal: +abdominal pain with nausea/vomiting Genitourinary: Negative for dysuria. Musculoskeletal: Negative for back pain. Skin: Negative for rash. Neurological: Negative for headaches, focal weakness or numbness.  10-point ROS otherwise  negative.  ____________________________________________   PHYSICAL EXAM:  VITAL SIGNS: ED Triage Vitals [07/25/16 1614]  Enc Vitals Group     BP (!) 174/108     Pulse Rate 73     Resp 17     Temp 98.7 F (37.1 C)     Temp Source Oral     SpO2 98 %     Weight 135 lb (61.2 kg)     Height 5\' 4"  (1.626 m)     Head Circumference      Peak Flow      Pain Score 10     Pain Loc      Pain Edu?      Excl. in GC?     Constitutional: Alert and oriented. Well appearing until he moves then complains of severe abdominal pain Eyes: Conjunctivae are normal. PERRL. EOMI. Head: Atraumatic. Nose: No congestion/rhinnorhea. Mouth/Throat: Mucous membranes are moist.  Oropharynx non-erythematous. Neck: No stridor.  No meningeal signs.   Cardiovascular: Normal rate, regular rhythm. Good peripheral circulation. Grossly normal heart sounds. Respiratory: Normal respiratory effort.  No retractions. Lungs CTAB. Gastrointestinal: Soft with severe tenderness throughout his abdomen and voluntary guarding. No distention Musculoskeletal: No lower extremity tenderness nor edema. No gross deformities of extremities. Neurologic:  Normal speech and language. No gross focal neurologic deficits are appreciated.  Skin:  Skin is warm, dry and intact. No rash noted. Psychiatric: Mood and affect are normal. Speech and behavior are normal.  ____________________________________________   LABS (all labs ordered are listed, but only abnormal results are displayed)  Labs Reviewed  COMPREHENSIVE METABOLIC PANEL - Abnormal; Notable for the following:       Result Value   Potassium 3.1 (*)    Calcium 8.7 (*)    ALT 11 (*)    All other components within normal limits  CBC - Abnormal; Notable for the following:    RBC 4.19 (*)    HCT 38.1 (*)    All other components within normal limits  URINALYSIS, COMPLETE (UACMP) WITH MICROSCOPIC - Abnormal; Notable for the following:    Color, Urine YELLOW (*)    APPearance  CLEAR (*)    Leukocytes, UA SMALL (*)    Bacteria, UA RARE (*)    Squamous Epithelial / LPF 0-5 (*)    All other components within normal limits  URINE CULTURE  LIPASE, BLOOD   ____________________________________________  EKG  None - EKG not ordered by ED physician ____________________________________________  RADIOLOGY   Ct Abdomen Pelvis W Contrast  Result Date: 07/25/2016 CLINICAL DATA:  Initial evaluation for acute generalized abdominal pain. History pancreatitis. EXAM: CT ABDOMEN AND PELVIS WITH CONTRAST TECHNIQUE: Multidetector CT imaging of the abdomen and pelvis was performed using the standard protocol following bolus administration of intravenous contrast. CONTRAST:  ISOVUE-300 IOPAMIDOL (ISOVUE-300) INJECTION 61% COMPARISON:  Prior CT from 03/11/2016. FINDINGS: Lower chest: Minimal atelectatic changes present dependently within the  visualized lung bases. Visualized lungs are otherwise clear. Hepatobiliary: Subcentimeter hypodensity within the right hepatic lobe noted, too small the characterize, but of doubtful clinical significance. Liver otherwise unremarkable. Gallbladder within normal limits. No biliary dilatation. Pancreas: Ill-defined hypodensity again seen at the level of the pancreatic head, similar to previous study (series 2, image 28). Evaluation for possible associated inflammatory changes within this region somewhat difficult given relative lack of intra- abdominal fat. Finding may reflect sequela of prior pancreatitis with pseudocyst formation. Possible acute exacerbation not entirely excluded. Underlying mass would be difficult to exclude, although no overt pancreatic ductal dilatation is seen within this region as would be expected. Remainder the pancreas within normal limits. Overall, changes are similar as compared to previous exams. Spleen: Subcentimeter hypodensity within the inferior aspect of the spleen noted, stable, and of doubtful clinical significance.  Spleen otherwise unremarkable. Adrenals/Urinary Tract: Adrenal glands within normal limits. Kidneys equal in size with symmetric enhancement. No nephrolithiasis, hydronephrosis, or focal enhancing renal mass. No hydroureter. Bladder within normal limits. Stomach/Bowel: Stomach within normal limits. No evidence for bowel obstruction. No acute inflammatory changes about the bowels. Moderate amount of retained stool diffusely throughout the colon. Small lipoma noted within the small bowel. Normal appendix. Vascular/Lymphatic: Mild aorto bi-iliac atherosclerotic disease. No aneurysm. Shotty subcentimeter retroperitoneal lymph nodes noted. No pathologically enlarged intra-abdominal or pelvic lymph nodes identified. Reproductive: Prostate within normal limits. Other: No free air.  No free fluid. Musculoskeletal: No acute osseous abnormality. No worrisome lytic or blastic osseous lesions. IMPRESSION: 1. Ill-defined low density within the pancreatic head, similar to previous exams, and suspected to reflect sequelae of prior pancreatitis with pseudocyst formation. No well-defined collection seen on this exam. While no overt peripancreatic inflammation, correlation with serum lipase is recommended as a mild/early acute exacerbation would be difficult to exclude. 2. No other acute intra-abdominal or pelvic process identified. 3. Moderate amount of retained stool within the colon, suggesting constipation. Electronically Signed   By: Rise Mu M.D.   On: 07/25/2016 23:06    ____________________________________________   PROCEDURES  Procedure(s) performed:   Procedures   Critical Care performed: No ____________________________________________   INITIAL IMPRESSION / ASSESSMENT AND PLAN / ED COURSE  Pertinent labs & imaging results that were available during my care of the patient were reviewed by me and considered in my medical decision making (see chart for details).  The patient has normal labs  today including a normal lipase.  His vitals are normal except for hypertension which he states he always has and had when he was at [facility].  Reviewed the electronic medical record including his visit at [facility] and his labs were within normal limits that visit as well.  They did not perform any imaging.  I looked back at imaging performed at [facility] and about a year ago he had an MR with contrast of his abdomen ordered by the gastroenterologist Dr. Shelle Iron which showed some pancreatic pseudocysts.  I will evaluate today with a CT of the abdomen and pelvis with oral and IV contrast to look for any acute abnormalities or evidence of acute pancreatitis.  I explained to the patient that if there are no acute findings on the CT scan he will need to go home and follow-up with his regular doctor.  He is tolerating oral intake and not currently having any vomiting.  I believe his symptoms are most likely chronic but do need follow-up.  [facility] physician at [facility] is likely the best person with him  to follow up since most of his care is at [facility] and Dr. Shelle Iron is no longer at [facility].  He agrees with the plan.    Clinical Course as of Jul 26 2315  Thu Jul 25, 2016  2315 CT scan non-acute with no changes from prior.  Updated patient, explained I cannot prescribe more narcotics, advised close outpatient follow up.  [CF]    Clinical Course User Index [CF] Loleta Rose, MD    ____________________________________________  FINAL CLINICAL IMPRESSION(S) / ED DIAGNOSES  Final diagnoses:  Chronic abdominal pain     MEDICATIONS GIVEN DURING THIS VISIT:  Medications  oxyCODONE-acetaminophen (PERCOCET/ROXICET) 5-325 MG per tablet 2 tablet (not administered)  morphine 4 MG/ML injection 4 mg (4 mg Intravenous Given 07/25/16 2054)  ondansetron (ZOFRAN) injection 4 mg (4 mg Intravenous Given 07/25/16 2052)  iopamidol (ISOVUE-300) 61 % injection 30 mL (30 mLs Oral Contrast Given 07/25/16  2035)  iopamidol (ISOVUE-300) 61 % injection 100 mL (100 mLs Intravenous Contrast Given 07/25/16 2221)     NEW OUTPATIENT MEDICATIONS STARTED DURING THIS VISIT:  New Prescriptions   No medications on file    Modified Medications   No medications on file    Discontinued Medications   No medications on file     Note:  This document was prepared using Dragon voice recognition software and may include unintentional dictation errors.    Loleta Rose, MD 07/25/16 [phone]

## 2016-07-27 LAB — URINE CULTURE
CULTURE: NO GROWTH
SPECIAL REQUESTS: NORMAL

## 2016-08-26 ENCOUNTER — Emergency Department
Admission: EM | Admit: 2016-08-26 | Discharge: 2016-08-26 | Disposition: A | Discharge status: Home / Self Care | Payer: Medicare Other | Attending: Student in an Organized Health Care Education/Training Program | Admitting: Student in an Organized Health Care Education/Training Program

## 2016-08-26 ENCOUNTER — Emergency Department: Payer: Medicare Other

## 2016-08-26 ENCOUNTER — Encounter: Payer: Self-pay | Admitting: Emergency Medicine

## 2016-08-26 DIAGNOSIS — Z79899 Other long term (current) drug therapy: Secondary | ICD-10-CM | POA: Diagnosis not present

## 2016-08-26 DIAGNOSIS — X509XXA Other and unspecified overexertion or strenuous movements or postures, initial encounter: Secondary | ICD-10-CM | POA: Insufficient documentation

## 2016-08-26 DIAGNOSIS — J449 Chronic obstructive pulmonary disease, unspecified: Secondary | ICD-10-CM | POA: Insufficient documentation

## 2016-08-26 DIAGNOSIS — S3992XA Unspecified injury of lower back, initial encounter: Secondary | ICD-10-CM | POA: Diagnosis present

## 2016-08-26 DIAGNOSIS — Y990 Civilian activity done for income or pay: Secondary | ICD-10-CM | POA: Diagnosis not present

## 2016-08-26 DIAGNOSIS — F17210 Nicotine dependence, cigarettes, uncomplicated: Secondary | ICD-10-CM | POA: Diagnosis not present

## 2016-08-26 DIAGNOSIS — S39012A Strain of muscle, fascia and tendon of lower back, initial encounter: Secondary | ICD-10-CM | POA: Insufficient documentation

## 2016-08-26 DIAGNOSIS — Y9389 Activity, other specified: Secondary | ICD-10-CM | POA: Diagnosis not present

## 2016-08-26 DIAGNOSIS — Y929 Unspecified place or not applicable: Secondary | ICD-10-CM | POA: Diagnosis not present

## 2016-08-26 DIAGNOSIS — Z21 Asymptomatic human immunodeficiency virus [HIV] infection status: Secondary | ICD-10-CM | POA: Diagnosis not present

## 2016-08-26 DIAGNOSIS — I10 Essential (primary) hypertension: Secondary | ICD-10-CM | POA: Diagnosis not present

## 2016-08-26 DIAGNOSIS — M5136 Other intervertebral disc degeneration, lumbar region: Secondary | ICD-10-CM | POA: Diagnosis not present

## 2016-08-26 MED ORDER — CYCLOBENZAPRINE HCL 5 MG PO TABS: 5.0000 mg | ORAL_TABLET | Freq: Three times a day (TID) | ORAL | 0 refills | Status: DC | PRN

## 2016-08-26 MED ORDER — KETOROLAC TROMETHAMINE 60 MG/2ML IM SOLN
30.0000 mg | Freq: Once | INTRAMUSCULAR | Status: AC
  Administered 2016-08-26: 30 mg via INTRAMUSCULAR
  Filled 2016-08-26: qty 2

## 2016-08-26 MED ORDER — DICLOFENAC SODIUM 75 MG PO TBEC: 75.0000 mg | DELAYED_RELEASE_TABLET | Freq: Two times a day (BID) | ORAL | 1 refills | Status: DC

## 2016-08-26 MED ORDER — ORPHENADRINE CITRATE 30 MG/ML IJ SOLN
60.0000 mg | INTRAMUSCULAR | Status: AC
  Administered 2016-08-26: 60 mg via INTRAMUSCULAR
  Filled 2016-08-26: qty 2

## 2016-08-26 NOTE — ED Provider Notes (Signed)
[facility] Emergency Department Provider Note ____________________________________________  Time seen: 0914  I have reviewed the triage vital signs and the nursing notes.  HISTORY  Chief Complaint  Back Pain  HPI Mason Martin is a 51 y.o. male visits to the ED for 2 weeks of increased low back pain with some pain radiating to the bilateral thighs and hips. Patient denies any recent injury, accident, or trauma. He does work as a Nutritional therapist, and admits to some irregular positioning with his activities. He describes being vitals with a pinched nerve when he saw his primary care provider a week and a half prior. He was treated with ibuprofen and has completed that course. He describes ongoing pain and stiffness. He describes pain is worsened with prolonged sitting and tightness and stiffness is exacerbated by transitioning from sitting to standing. He denies any distal paresthesias, bladder or bowel incontinence, foot drop, or leg weakness.Presents today in a slightly forward flexed position due to muscle tightness.  Past Medical History:  Diagnosis Date  . 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 COPD (chronic obstructive pulmonary disease) (HCC)   . GERD (gastroesophageal reflux disease)   . History of meningitis   . HIV disease (HCC)    on HAART, follows with ID at [facility]  . Hypertension   . Neck pain    with left arm radiculopathy  . Pancreas cyst   . Pancreatitis    chronic pancreatitis  . TIA (transient ischemic attack)   . Tobacco use     Patient Active Problem List   Diagnosis Date Noted  . Acute pancreatitis 07/01/2015    Past Surgical History:  Procedure Laterality Date  . ESOPHAGEAL ATRESIA REPAIR      Prior to Admission medications   Medication Sig Start Date End Date Taking? Authorizing Provider  cetirizine (ZYRTEC) 10 MG tablet Take 10 mg by mouth daily. 04/27/15   Historical Provider, MD  CREON 24000 units CPEP Take 24,000  Units by mouth 3 (three) times daily with meals. 06/22/15   Historical Provider, MD  cyclobenzaprine (FLEXERIL) 5 MG tablet Take 1 tablet (5 mg total) by mouth 3 (three) times daily as needed for muscle spasms. 08/26/16   Deserie Dirks V Bacon Cristen Murcia, PA-C  diclofenac (VOLTAREN) 75 MG EC tablet Take 1 tablet (75 mg total) by mouth 2 (two) times daily. 08/26/16   Ledia Hanford V Bacon Jerritt Cardoza, PA-C  enalapril (VASOTEC) 20 MG tablet Take 20 mg by mouth daily. 06/22/15   Historical Provider, MD  NEXIUM 40 MG capsule Take 40 mg by mouth daily before breakfast. 06/22/15   Historical Provider, MD  NORVIR 100 MG TABS tablet Take 100 mg by mouth daily. 06/22/15   Historical Provider, MD  Olopatadine HCl 0.2 % SOLN Apply 1 drop to eye daily. 04/24/16   Chinita Pester, FNP  PREZISTA 800 MG tablet Take 800 mg by mouth daily with breakfast. 06/22/15   Historical Provider, MD  TIVICAY 50 MG tablet Take 50 mg by mouth daily. 06/22/15   Historical Provider, MD  triamcinolone cream (KENALOG) 0.5 % Apply 1 application topically 2 (two) times daily. 06/22/15   Historical Provider, MD  VENTOLIN HFA 108 (90 Base) MCG/ACT inhaler Inhale 2 puffs into the lungs 4 (four) times daily as needed. For shortness of breath and/or wheezing. 06/22/15   Historical Provider, MD    Allergies Sulfur  Family History  Problem Relation Age of Onset  . Heart failure Father   .  Emphysema Father     Social History Social History  Substance Use Topics  . Smoking status: Current Every Day Smoker    Packs/day: 0.50    Types: Cigarettes  . Smokeless tobacco: Never Used  . Alcohol use 0.0 oz/week    Review of Systems  Constitutional: Negative for fever. Cardiovascular: Negative for chest pain. Respiratory: Negative for shortness of breath. Gastrointestinal: Negative for abdominal pain, vomiting and diarrhea. Genitourinary: Negative for dysuria. Musculoskeletal: Positive for back pain. Skin: Negative for rash. Neurological: Negative for  headaches, focal weakness or numbness. ____________________________________________  PHYSICAL EXAM:  VITAL SIGNS: ED Triage Vitals  Enc Vitals Group     BP 08/26/16 0859 (!) 140/94     Pulse Rate 08/26/16 0859 91     Resp 08/26/16 0859 18     Temp 08/26/16 0859 98.2 F (36.8 C)     Temp Source 08/26/16 0859 Oral     SpO2 08/26/16 0859 98 %     Weight 08/26/16 0859 130 lb (59 kg)     Height 08/26/16 0859 5\' 4"  (1.626 m)     Head Circumference --      Peak Flow --      Pain Score 08/26/16 0900 7     Pain Loc --      Pain Edu? --      Excl. in GC? --     Constitutional: Alert and oriented. Well appearing and in no distress. Head: Normocephalic and atraumatic. Cardiovascular: Normal rate, regular rhythm. Normal distal pulses. Respiratory: Normal respiratory effort. No wheezes/rales/rhonchi. Gastrointestinal: Soft and nontender. No distention. Musculoskeletal: Normal spinal alignment without midline tenderness, spasm, deformity, or step-off. Patient with slow transition from sit to stand. He is able to to demonstrate normal toe and heel raise on exam. He has decreased lumbar extension secondary to muscle tightness. Nontender with normal range of motion in all extremities.  Neurologic:  Antalgic gait without ataxia. Cranial nerves II through XII grossly intact. Normal LE DTRs bilaterally. Normal speech and language. No gross focal neurologic deficits are appreciated. Skin:  Skin is warm, dry and intact. No rash noted. ____________________________________________   RADIOLOGY  Lumbar Spine  IMPRESSION: 1. Multilevel mild lumbar spondylosis with degenerative disc disease at L5-S1, and probable foraminal narrowing at the L5-S1 level.  I, Daniela Hernan, Charlesetta IvoryJenise V Bacon, personally viewed and evaluated these images (plain radiographs) as part of my medical decision making, as well as reviewing the written report by the  radiologist. ____________________________________________  PROCEDURES  Toradol 30 mg IM Norflex 60 mg IM ____________________________________________  INITIAL IMPRESSION / ASSESSMENT AND PLAN / ED COURSE  Patient with acute lumbar sacral strain and muscle spasm with underlying degenerative disc disease and spondylosis confirmed by radiology. Patient is discharged with prescriptions for Voltaren as well as Flexeril doses directed. He is advised to monitor the mechanics and he should follow-up with his primary care provider for ongoing symptom management. A work note is provided for 3 days at this time. He should return to the ED immediately for any worsening symptoms. ____________________________________________  FINAL CLINICAL IMPRESSION(S) / ED DIAGNOSES  Final diagnoses:  Strain of lumbar region, initial encounter  DDD (degenerative disc disease), lumbar      Lissa HoardJenise V Bacon Madline Oesterling, PA-C 08/26/16 1622    Willy EddyPatrick Robinson, MD 08/27/16 2241

## 2016-08-26 NOTE — ED Notes (Signed)
AAOx3.  Skin warm and dry.  NAD 

## 2016-08-26 NOTE — Discharge Instructions (Signed)
Your x-ray revealed degenerative disc disease and some arthritis in the lower back. You should take the prescription meds as directed. Apply ice and moist heat to reduce symptoms. Follow-up with your provider in 2 weeks if symptoms don't improve.

## 2016-08-26 NOTE — ED Notes (Signed)
See triage note  States he developed lower back pain about 2 week ago.  States he was working on a well and pulled something heavy at that time  [facility] but states pain is not any better  Pain is radiating into both legs  Ambulates slowly d/t increased pain

## 2016-08-26 NOTE — ED Triage Notes (Signed)
x2 weeks of increased lower back pain, with radiating pain to bilateral legs.

## 2016-09-23 DIAGNOSIS — F17210 Nicotine dependence, cigarettes, uncomplicated: Secondary | ICD-10-CM | POA: Insufficient documentation

## 2016-09-23 DIAGNOSIS — Z21 Asymptomatic human immunodeficiency virus [HIV] infection status: Secondary | ICD-10-CM | POA: Diagnosis not present

## 2016-09-23 DIAGNOSIS — J449 Chronic obstructive pulmonary disease, unspecified: Secondary | ICD-10-CM | POA: Diagnosis not present

## 2016-09-23 DIAGNOSIS — I10 Essential (primary) hypertension: Secondary | ICD-10-CM | POA: Diagnosis not present

## 2016-09-23 DIAGNOSIS — R1084 Generalized abdominal pain: Secondary | ICD-10-CM | POA: Insufficient documentation

## 2016-09-23 DIAGNOSIS — Z79899 Other long term (current) drug therapy: Secondary | ICD-10-CM | POA: Diagnosis not present

## 2016-09-23 LAB — CBC
HCT: 46 % (ref 40.0–52.0)
Hemoglobin: 15.9 g/dL (ref 13.0–18.0)
MCH: 30.9 pg (ref 26.0–34.0)
MCHC: 34.5 g/dL (ref 32.0–36.0)
MCV: 89.5 fL (ref 80.0–100.0)
Platelets: 276 10*3/uL (ref 150–440)
RBC: 5.14 MIL/uL (ref 4.40–5.90)
RDW: 14.4 % (ref 11.5–14.5)
WBC: 10.4 10*3/uL (ref 3.8–10.6)

## 2016-09-23 LAB — COMPREHENSIVE METABOLIC PANEL
ALT: 13 U/L — AB (ref 17–63)
AST: 20 U/L (ref 15–41)
Albumin: 4.2 g/dL (ref 3.5–5.0)
Alkaline Phosphatase: 98 U/L (ref 38–126)
Anion gap: 12 (ref 5–15)
BUN: 11 mg/dL (ref 6–20)
CHLORIDE: 98 mmol/L — AB (ref 101–111)
CO2: 27 mmol/L (ref 22–32)
Calcium: 9.4 mg/dL (ref 8.9–10.3)
Creatinine, Ser: 0.92 mg/dL (ref 0.61–1.24)
GFR calc Af Amer: >60 mL/min (ref >60)
Glucose, Bld: 136 mg/dL — ABNORMAL HIGH (ref 65–99)
Potassium: 3.1 mmol/L — ABNORMAL LOW (ref 3.5–5.1)
SODIUM: 137 mmol/L (ref 135–145)
Total Bilirubin: 1.2 mg/dL (ref 0.3–1.2)
Total Protein: 8.1 g/dL (ref 6.5–8.1)

## 2016-09-23 LAB — LIPASE, BLOOD: LIPASE: 21 U/L (ref 11–51)

## 2016-09-23 NOTE — ED Triage Notes (Addendum)
Pt with 2 days of RLQ and LLQ abdominal pain with vomiting. Pt reports h/x of pancreatitis; pt reports presenting s/x are same as pancreatitis issues in past. Pt denies any c/o CP or SHOB. Pt reports last ETOH was Saturday.

## 2016-09-24 ENCOUNTER — Encounter: Payer: Self-pay | Admitting: Radiology

## 2016-09-24 ENCOUNTER — Emergency Department
Admission: EM | Admit: 2016-09-24 | Discharge: 2016-09-24 | Disposition: A | Discharge status: Home / Self Care | Payer: Medicare Other | Attending: Emergency Medicine | Admitting: Emergency Medicine

## 2016-09-24 ENCOUNTER — Emergency Department: Payer: Medicare Other

## 2016-09-24 DIAGNOSIS — R109 Unspecified abdominal pain: Secondary | ICD-10-CM

## 2016-09-24 DIAGNOSIS — R1084 Generalized abdominal pain: Secondary | ICD-10-CM | POA: Diagnosis not present

## 2016-09-24 LAB — URINALYSIS, COMPLETE (UACMP) WITH MICROSCOPIC
BACTERIA UA: NONE SEEN
BILIRUBIN URINE: NEGATIVE
Glucose, UA: NEGATIVE mg/dL
Hgb urine dipstick: NEGATIVE
KETONES UR: NEGATIVE mg/dL
LEUKOCYTES UA: NEGATIVE
Nitrite: NEGATIVE
PROTEIN: 100 mg/dL — AB
Specific Gravity, Urine: 1.044 — ABNORMAL HIGH (ref 1.005–1.030)
pH: 5 (ref 5.0–8.0)

## 2016-09-24 LAB — LIPASE, BLOOD: Lipase: 23 U/L (ref 11–51)

## 2016-09-24 LAB — CHLAMYDIA/NGC RT PCR (ARMC ONLY)
Chlamydia Tr: NOT DETECTED
N GONORRHOEAE: NOT DETECTED

## 2016-09-24 MED ORDER — SODIUM CHLORIDE 0.9 % IV SOLN
Freq: Once | INTRAVENOUS | Status: AC
  Administered 2016-09-24: 02:00:00 via INTRAVENOUS

## 2016-09-24 MED ORDER — ONDANSETRON HCL 4 MG/2ML IJ SOLN
4.0000 mg | Freq: Once | INTRAMUSCULAR | Status: AC
  Administered 2016-09-24: 4 mg via INTRAVENOUS
  Filled 2016-09-24: qty 2

## 2016-09-24 MED ORDER — CEPHALEXIN 500 MG PO CAPS: 500.0000 mg | ORAL_CAPSULE | Freq: Four times a day (QID) | ORAL | 0 refills | Status: AC

## 2016-09-24 MED ORDER — HYDROMORPHONE HCL 1 MG/ML IJ SOLN
0.5000 mg | Freq: Once | INTRAMUSCULAR | Status: AC
  Administered 2016-09-24: 0.5 mg via INTRAVENOUS
  Filled 2016-09-24: qty 1

## 2016-09-24 MED ORDER — IOPAMIDOL (ISOVUE-300) INJECTION 61%
30.0000 mL | Freq: Once | INTRAVENOUS | Status: AC | PRN
  Administered 2016-09-24: 30 mL via ORAL

## 2016-09-24 MED ORDER — OXYCODONE-ACETAMINOPHEN 5-325 MG PO TABS: 1.0000 | ORAL_TABLET | Freq: Four times a day (QID) | ORAL | 0 refills | Status: DC | PRN

## 2016-09-24 MED ORDER — IOPAMIDOL (ISOVUE-300) INJECTION 61%
100.0000 mL | Freq: Once | INTRAVENOUS | Status: AC | PRN
  Administered 2016-09-24: 100 mL via INTRAVENOUS

## 2016-09-24 NOTE — ED Provider Notes (Signed)
[facility] Emergency Department Provider Note   ____________________________________________   First MD Initiated Contact with Patient 09/24/16 0136     (approximate)  I have reviewed the triage vital signs and the nursing notes.   HISTORY  Chief Complaint Abdominal Pain and Emesis    HPI Mason Martin is a 51 y.o. male who reports abdominal pain starting on Sunday could be now 2 days ago. Reports it feels something like his pancreatitis she's had in the past. Pain is sharp diffuse and achy. He's had nausea and vomiting and one episode of diarrhea. He can't keep anything down. He is not running a fever.  \ Past Medical History:  Diagnosis Date  . 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 COPD (chronic obstructive pulmonary disease) (HCC)   . GERD (gastroesophageal reflux disease)   . History of meningitis   . HIV disease (HCC)    on HAART, [facility]  . Hypertension   . Neck pain    with left arm radiculopathy  . Pancreas cyst   . Pancreatitis    chronic pancreatitis  . TIA (transient ischemic attack)   . Tobacco use     Patient Active Problem List   Diagnosis Date Noted  . Acute pancreatitis 07/01/2015    Past Surgical History:  Procedure Laterality Date  . ESOPHAGEAL ATRESIA REPAIR      Prior to Admission medications   Medication Sig Start Date End Date Taking? Authorizing Provider  cetirizine (ZYRTEC) 10 MG tablet Take 10 mg by mouth daily. 04/27/15  Yes Historical Provider, MD  CREON 24000 units CPEP Take 24,000 Units by mouth 3 (three) times daily with meals. 06/22/15  Yes Historical Provider, MD  cyclobenzaprine (FLEXERIL) 5 MG tablet Take 1 tablet (5 mg total) by mouth 3 (three) times daily as needed for muscle spasms. 08/26/16  Yes Jenise V Bacon Menshew, PA-C  diclofenac (VOLTAREN) 75 MG EC tablet Take 1 tablet (75 mg total) by mouth 2 (two) times daily. 08/26/16  Yes Jenise V Bacon Menshew, PA-C    enalapril (VASOTEC) 20 MG tablet Take 20 mg by mouth daily. 06/22/15  Yes Historical Provider, MD  gabapentin (NEURONTIN) 300 MG capsule Take 300 mg by mouth 2 (two) times daily.   Yes Historical Provider, MD  NEXIUM 40 MG capsule Take 40 mg by mouth daily before breakfast. 06/22/15  Yes Historical Provider, MD  PREZCOBIX 800-150 MG tablet Take 1 tablet by mouth daily.   Yes Historical Provider, MD  TIVICAY 50 MG tablet Take 50 mg by mouth daily. 06/22/15  Yes Historical Provider, MD  VENTOLIN HFA 108 (90 Base) MCG/ACT inhaler Inhale 2 puffs into the lungs 4 (four) times daily as needed. For shortness of breath and/or wheezing. 06/22/15  Yes Historical Provider, MD  cephALEXin (KEFLEX) 500 MG capsule Take 1 capsule (500 mg total) by mouth 4 (four) times daily. 09/24/16 10/04/16  Arnaldo NatalPaul F Malinda, MD  Olopatadine HCl 0.2 % SOLN Apply 1 drop to eye daily. Patient not taking: Reported on 09/24/2016 04/24/16   Chinita Pesterari B Triplett, FNP  oxyCODONE-acetaminophen (ROXICET) 5-325 MG tablet Take 1 tablet by mouth every 6 (six) hours as needed. 09/24/16 09/24/17  Arnaldo NatalPaul F Malinda, MD  triamcinolone cream (KENALOG) 0.5 % Apply 1 application topically 2 (two) times daily. 06/22/15   Historical Provider, MD    Allergies Sulfur  Family History  Problem Relation Age of Onset  . Heart failure Father   .  Emphysema Father     Social History Social History  Substance Use Topics  . Smoking status: Current Every Day Smoker    Packs/day: 0.50    Types: Cigarettes  . Smokeless tobacco: Never Used  . Alcohol use 0.0 oz/week    Review of Systems Constitutional: No fever/chills Eyes: No visual changes. ENT: No sore throat. Cardiovascular: Denies chest pain. Respiratory: Denies shortness of breath. Gastrointestinal: See history of present illness Genitourinary: Negative for dysuria. Musculoskeletal: Negative for back pain. Skin: Negative for rash.  10-point ROS otherwise  negative.  ____________________________________________   PHYSICAL EXAM:  VITAL SIGNS: ED Triage Vitals  Enc Vitals Group     BP 09/23/16 2126 (!) 143/90     Pulse Rate 09/23/16 2126 96     Resp 09/23/16 2126 16     Temp 09/23/16 2126 98.6 F (37 C)     Temp Source 09/23/16 2126 Oral     SpO2 09/23/16 2126 97 %     Weight 09/23/16 2127 135 lb (61.2 kg)     Height 09/23/16 2127 5\' 5"  (1.651 m)     Head Circumference --      Peak Flow --      Pain Score 09/23/16 2126 10     Pain Loc --      Pain Edu? --      Excl. in GC? --     Constitutional: Alert and oriented. Well appearing and in  acute distress. Eyes: Conjunctivae are normal. PERRL. EOMI. Head: Atraumatic. Nose: No congestion/rhinnorhea. Mouth/Throat: Mucous membranes are moist.  Oropharynx non-erythematous. Neck: No stridor. Cardiovascular: Normal rate, regular rhythm. Grossly normal heart sounds.  Good peripheral circulation. Respiratory: Normal respiratory effort.  No retractions. Lungs CTAB. Gastrointestinal: Soft Diffusely tender to palpation even light palpation. Sounds are somewhat decreased. No distention. No abdominal bruits. No CVA tenderness. Musculoskeletal: No lower extremity tenderness nor edema.  No joint effusions. Neurologic:  Normal speech and language. No gross focal neurologic deficits are appreciated. No gait instability. Skin:  Skin is warm, dry and intact. No rash noted.   ____________________________________________   LABS (all labs ordered are listed, but only abnormal results are displayed)  Labs Reviewed  COMPREHENSIVE METABOLIC PANEL - Abnormal; Notable for the following:       Result Value   Potassium 3.1 (*)    Chloride 98 (*)    Glucose, Bld 136 (*)    ALT 13 (*)    All other components within normal limits  URINALYSIS, COMPLETE (UACMP) WITH MICROSCOPIC - Abnormal; Notable for the following:    Color, Urine AMBER (*)    APPearance HAZY (*)    Specific Gravity, Urine 1.044 (*)     Protein, ur 100 (*)    Squamous Epithelial / LPF 0-5 (*)    All other components within normal limits  CHLAMYDIA/NGC RT PCR ([facility] ONLY)  URINE CULTURE  LIPASE, BLOOD  CBC  LIPASE, BLOOD   ____________________________________________  EKG   ____________________________________________  RADIOLOGY  Study Result   CLINICAL DATA:  51 y/o M; 2 days of lower abdominal pain with vomiting. History of pancreatitis.  EXAM: CT ABDOMEN AND PELVIS WITH CONTRAST  TECHNIQUE: Multidetector CT imaging of the abdomen and pelvis was performed using the standard protocol following bolus administration of intravenous contrast.  CONTRAST:  ISOVUE-300 IOPAMIDOL (ISOVUE-300) INJECTION 61%  COMPARISON:  07/25/2016 and 03/11/2016 CT of the abdomen and pelvis. 07/04/2015 MRI of the abdomen and pelvis.  FINDINGS: Lower chest: No acute abnormality.  Hepatobiliary: Stable subcentimeter lucency in the right lobe of liver probably representing a cyst. No intra or extrahepatic biliary ductal dilatation. Normal gallbladder.  Pancreas: Stable ill-defined density within the head of pancreas and mild surrounding stranding of peripancreatic fat better characterized on prior abdominal MRI likely representing sequelae of prior pancreatitis. No main duct dilatation. No new inflammatory change of the pancreas.  Spleen: Stable lower pole lucency likely representing a cyst. Spleen is otherwise unremarkable.  Adrenals/Urinary Tract: Adrenal glands are unremarkable. Kidneys are normal, without renal calculi, focal lesion, or hydronephrosis. Bladder is unremarkable.  Stomach/Bowel: Stomach is within normal limits. Appendix appears normal. No evidence of bowel wall thickening, distention, or inflammatory changes. Stable small left upper quadrant small bowel lipoma.  Vascular/Lymphatic: Aortic atherosclerosis. No enlarged abdominal or pelvic lymph nodes.  Reproductive:  Prostate is unremarkable.  Other: No abdominal wall hernia or abnormality. No abdominopelvic ascites.  Musculoskeletal: No acute or significant osseous findings. Mild discogenic degenerative changes of lumbar spine.  IMPRESSION: 1. No acute process identified as explanation for abdominal pain. 2. Stable ill-defined lucency within pancreatic head previously characterized as sequelae of pancreatitis with pseudocyst formation. No definite acute superimposed inflammatory process. 3. Additional stable chronic findings as above.   Electronically Signed   By: Mitzi Hansen M.D.   On: 09/24/2016 04:06     ____________________________________________   PROCEDURES  Procedure(s) performed:   Procedures  Critical Care performed:   ____________________________________________   INITIAL IMPRESSION / ASSESSMENT AND PLAN / ED COURSE  Pertinent labs & imaging results that were available during my care of the patient were reviewed by me and considered in my medical decision making (see chart for details).        ____________________________________________   FINAL CLINICAL IMPRESSION(S) / ED DIAGNOSES  Final diagnoses:  Abdominal pain, unspecified abdominal location      NEW MEDICATIONS STARTED DURING THIS VISIT:  New Prescriptions   CEPHALEXIN (KEFLEX) 500 MG CAPSULE    Take 1 capsule (500 mg total) by mouth 4 (four) times daily.   OXYCODONE-ACETAMINOPHEN (ROXICET) 5-325 MG TABLET    Take 1 tablet by mouth every 6 (six) hours as needed.     Note:  This document was prepared using Dragon voice recognition software and may include unintentional dictation errors.    Arnaldo Natal, MD 09/24/16 [phone]

## 2016-09-24 NOTE — Discharge Instructions (Signed)
Please follow-up with your regular doctor. If need be use the pain pills 1 pill 4 times a day. Be carefully can make you constipated and sleepy. Do not drive on them. Please return for worse pain fever vomiting or feeling sicker. It looks like you have a little urinary tract infection. I will give you some antibiotics for that. Keflex one pill 4 times a day.

## 2016-09-24 NOTE — ED Notes (Signed)
Pt informed that urine is needed for UA, states he is not able at this time but will try again.

## 2016-09-25 LAB — URINE CULTURE: Culture: <10000 — AB

## 2016-10-23 ENCOUNTER — Emergency Department: Payer: Medicare Other

## 2016-10-23 ENCOUNTER — Emergency Department
Admission: EM | Admit: 2016-10-23 | Discharge: 2016-10-24 | Disposition: A | Discharge status: Home / Self Care | Payer: Medicare Other | Attending: Emergency Medicine | Admitting: Emergency Medicine

## 2016-10-23 DIAGNOSIS — J449 Chronic obstructive pulmonary disease, unspecified: Secondary | ICD-10-CM | POA: Diagnosis not present

## 2016-10-23 DIAGNOSIS — Z21 Asymptomatic human immunodeficiency virus [HIV] infection status: Secondary | ICD-10-CM | POA: Diagnosis not present

## 2016-10-23 DIAGNOSIS — I10 Essential (primary) hypertension: Secondary | ICD-10-CM | POA: Diagnosis not present

## 2016-10-23 DIAGNOSIS — F17210 Nicotine dependence, cigarettes, uncomplicated: Secondary | ICD-10-CM | POA: Insufficient documentation

## 2016-10-23 DIAGNOSIS — R1013 Epigastric pain: Secondary | ICD-10-CM | POA: Diagnosis present

## 2016-10-23 DIAGNOSIS — Z79899 Other long term (current) drug therapy: Secondary | ICD-10-CM | POA: Insufficient documentation

## 2016-10-23 DIAGNOSIS — J189 Pneumonia, unspecified organism: Secondary | ICD-10-CM | POA: Diagnosis not present

## 2016-10-23 LAB — COMPREHENSIVE METABOLIC PANEL
ALBUMIN: 3.5 g/dL (ref 3.5–5.0)
ALK PHOS: 68 U/L (ref 38–126)
ALT: 11 U/L — AB (ref 17–63)
AST: 20 U/L (ref 15–41)
Anion gap: 7 (ref 5–15)
BUN: 10 mg/dL (ref 6–20)
CALCIUM: 8.6 mg/dL — AB (ref 8.9–10.3)
CO2: 25 mmol/L (ref 22–32)
CREATININE: 0.84 mg/dL (ref 0.61–1.24)
Chloride: 107 mmol/L (ref 101–111)
GFR calc non Af Amer: >60 mL/min (ref >60)
Glucose, Bld: 120 mg/dL — ABNORMAL HIGH (ref 65–99)
Potassium: 2.9 mmol/L — ABNORMAL LOW (ref 3.5–5.1)
SODIUM: 139 mmol/L (ref 135–145)
Total Bilirubin: 0.3 mg/dL (ref 0.3–1.2)
Total Protein: 6.3 g/dL — ABNORMAL LOW (ref 6.5–8.1)

## 2016-10-23 LAB — CBC WITH DIFFERENTIAL/PLATELET
Basophils Absolute: 0.1 10*3/uL (ref 0–0.1)
Basophils Relative: 1 %
EOS ABS: 0.1 10*3/uL (ref 0–0.7)
Eosinophils Relative: 2 %
HCT: 39.4 % — ABNORMAL LOW (ref 40.0–52.0)
HEMOGLOBIN: 13.4 g/dL (ref 13.0–18.0)
LYMPHS ABS: 2.5 10*3/uL (ref 1.0–3.6)
LYMPHS PCT: 39 %
MCH: 31 pg (ref 26.0–34.0)
MCHC: 34 g/dL (ref 32.0–36.0)
MCV: 91.2 fL (ref 80.0–100.0)
Monocytes Absolute: 0.6 10*3/uL (ref 0.2–1.0)
Monocytes Relative: 9 %
NEUTROS ABS: 3.1 10*3/uL (ref 1.4–6.5)
NEUTROS PCT: 49 %
Platelets: 214 10*3/uL (ref 150–440)
RBC: 4.32 MIL/uL — AB (ref 4.40–5.90)
RDW: 14.7 % — ABNORMAL HIGH (ref 11.5–14.5)
WBC: 6.4 10*3/uL (ref 3.8–10.6)

## 2016-10-23 LAB — LIPASE, BLOOD: Lipase: 32 U/L (ref 11–51)

## 2016-10-23 LAB — ETHANOL: Alcohol, Ethyl (B): <5 mg/dL (ref <5)

## 2016-10-23 LAB — TROPONIN I: Troponin I: <0.03 ng/mL (ref <0.03)

## 2016-10-23 MED ORDER — FAMOTIDINE IN NACL 20-0.9 MG/50ML-% IV SOLN
20.0000 mg | Freq: Once | INTRAVENOUS | Status: AC
  Administered 2016-10-23: 20 mg via INTRAVENOUS
  Filled 2016-10-23: qty 50

## 2016-10-23 MED ORDER — MORPHINE SULFATE (PF) 4 MG/ML IV SOLN
4.0000 mg | Freq: Once | INTRAVENOUS | Status: AC
  Administered 2016-10-23: 4 mg via INTRAVENOUS
  Filled 2016-10-23: qty 1

## 2016-10-23 MED ORDER — POTASSIUM CHLORIDE CRYS ER 20 MEQ PO TBCR
40.0000 meq | EXTENDED_RELEASE_TABLET | Freq: Once | ORAL | Status: AC
  Administered 2016-10-23: 40 meq via ORAL
  Filled 2016-10-23: qty 2

## 2016-10-23 MED ORDER — PANTOPRAZOLE SODIUM 40 MG IV SOLR
40.0000 mg | Freq: Once | INTRAVENOUS | Status: AC
  Administered 2016-10-23: 40 mg via INTRAVENOUS
  Filled 2016-10-23: qty 40

## 2016-10-23 MED ORDER — GI COCKTAIL ~~LOC~~
30.0000 mL | Freq: Once | ORAL | Status: AC
  Administered 2016-10-23: 30 mL via ORAL
  Filled 2016-10-23: qty 30

## 2016-10-23 MED ORDER — SODIUM CHLORIDE 0.9 % IV SOLN
Freq: Once | INTRAVENOUS | Status: AC
  Administered 2016-10-23: 23:00:00 via INTRAVENOUS
  Filled 2016-10-23: qty 1000

## 2016-10-23 NOTE — ED Provider Notes (Addendum)
[facility]-EMERGENCY DEPARTMENT Provider Note   CSN: [id number] Arrival date & time: 10/23/16  2123     History   Chief Complaint Chief Complaint  Patient presents with  . Chest Pain    HPI GAL FELDHAUS is a 51 y.o. male history of COPD, HIV (CD 4 normal in January 2018), previous chronic pancreatitis here presenting with epigastric pain, chest pain. Patient states that he ate some barbecue and then 30 minutes later had acute onset of chest pain that radiated to his right arm and leg. Denies any numbness or weakness to the arm or legs. Patient states that he felt nauseated. Given 4 aspirin by EMS and felt better. Has history of chronic pancreatitis and denies any current alcohol use.   The history is provided by the patient.    Past Medical History:  Diagnosis Date  . 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 COPD (chronic obstructive pulmonary disease) (HCC)   . GERD (gastroesophageal reflux disease)   . History of meningitis   . HIV disease (HCC)    on HAART, follows with ID at [facility]  . Hypertension   . Neck pain    with left arm radiculopathy  . Pancreas cyst   . Pancreatitis    chronic pancreatitis  . TIA (transient ischemic attack)   . Tobacco use     Patient Active Problem List   Diagnosis Date Noted  . Acute pancreatitis 07/01/2015    Past Surgical History:  Procedure Laterality Date  . ESOPHAGEAL ATRESIA REPAIR         Home Medications    Prior to Admission medications   Medication Sig Start Date End Date Taking? Authorizing Provider  cetirizine (ZYRTEC) 10 MG tablet Take 10 mg by mouth daily. 04/27/15   Historical Provider, MD  CREON 24000 units CPEP Take 24,000 Units by mouth 3 (three) times daily with meals. 06/22/15   Historical Provider, MD  cyclobenzaprine (FLEXERIL) 5 MG tablet Take 1 tablet (5 mg total) by mouth 3 (three) times daily as needed for muscle spasms. 08/26/16   Jenise V Bacon Menshew, PA-C  diclofenac (VOLTAREN) 75 MG EC  tablet Take 1 tablet (75 mg total) by mouth 2 (two) times daily. 08/26/16   Jenise V Bacon Menshew, PA-C  enalapril (VASOTEC) 20 MG tablet Take 20 mg by mouth daily. 06/22/15   Historical Provider, MD  gabapentin (NEURONTIN) 300 MG capsule Take 300 mg by mouth 2 (two) times daily.    Historical Provider, MD  NEXIUM 40 MG capsule Take 40 mg by mouth daily before breakfast. 06/22/15   Historical Provider, MD  Olopatadine HCl 0.2 % SOLN Apply 1 drop to eye daily. Patient not taking: Reported on 09/24/2016 04/24/16   Chinita Pester, FNP  oxyCODONE-acetaminophen (ROXICET) 5-325 MG tablet Take 1 tablet by mouth every 6 (six) hours as needed. 09/24/16 09/24/17  Arnaldo Natal, MD  PREZCOBIX 800-150 MG tablet Take 1 tablet by mouth daily.    Historical Provider, MD  TIVICAY 50 MG tablet Take 50 mg by mouth daily. 06/22/15   Historical Provider, MD  triamcinolone cream (KENALOG) 0.5 % Apply 1 application topically 2 (two) times daily. 06/22/15   Historical Provider, MD  VENTOLIN HFA 108 (90 Base) MCG/ACT inhaler Inhale 2 puffs into the lungs 4 (four) times daily as needed. For shortness of breath and/or wheezing. 06/22/15   Historical Provider, MD    Family History Family History  Problem Relation Age of Onset  .  Heart failure Father   . Emphysema Father     Social History Social History  Substance Use Topics  . Smoking status: Current Every Day Smoker    Packs/day: 0.50    Types: Cigarettes  . Smokeless tobacco: Never Used  . Alcohol use 0.0 oz/week     Allergies   Sulfur   Review of Systems Review of Systems  Cardiovascular: Positive for chest pain.  All other systems reviewed and are negative.    Physical Exam Updated Vital Signs BP 124/81   Pulse (!) 51   Temp 98 F (36.7 C) (Oral)   Resp 15   Ht  (1.651 m)   Wt 135 lb (61.2 kg)   SpO2 99%   BMI 22.47 kg/m   Physical Exam  Constitutional: He is oriented to person, place, and time. He appears well-developed and  well-nourished.  HENT:  Head: Normocephalic.  Mouth/Throat: Oropharynx is clear and moist.  Eyes: EOM are normal. Pupils are equal, round, and reactive to light.  Neck: Normal range of motion. Neck supple.  Cardiovascular: Normal rate, regular rhythm and normal heart sounds.   Pulmonary/Chest: Effort normal and breath sounds normal. No respiratory distress. He has no wheezes.  Abdominal: Soft. Bowel sounds are normal.  Mild epigastric tenderness   Musculoskeletal: Normal range of motion. He exhibits no edema.  2+ pulses all extremities. Equal blood pressures all extremities   Neurological: He is alert and oriented to person, place, and time. No cranial nerve deficit. Coordination normal.  Skin: Skin is warm.  Psychiatric: He has a normal mood and affect.  Nursing note and vitals reviewed.    ED Treatments / Results  Labs (all labs ordered are listed, but only abnormal results are displayed) Labs Reviewed  CBC WITH DIFFERENTIAL/PLATELET - Abnormal; Notable for the following:       Result Value   RBC 4.32 (*)    HCT 39.4 (*)    RDW 14.7 (*)    All other components within normal limits  COMPREHENSIVE METABOLIC PANEL - Abnormal; Notable for the following:    Potassium 2.9 (*)    Glucose, Bld 120 (*)    Calcium 8.6 (*)    Total Protein 6.3 (*)    ALT 11 (*)    All other components within normal limits  TROPONIN I  LIPASE, BLOOD  ETHANOL    EKG  EKG Interpretation  Date/Time:  Wednesday October 23 2016 21:28:14 EDT Ventricular Rate:  61 PR Interval:    QRS Duration: 100 QT Interval:  410 QTC Calculation: 413 R Axis:   40 Text Interpretation:  Sinus rhythm No significant change since last tracing Confirmed by Jeston Junkins  MD, Zavion Sleight ([id number]) on 10/23/2016 10:15:23 PM       Radiology Dg Chest 2 View  Result Date: 10/23/2016 CLINICAL DATA:  RIGHT chest pain radiating to extremities. History of HIV. EXAM: CHEST  2 VIEW COMPARISON:  Chest radiograph October eleventh 2016 FINDINGS:  Cardiomediastinal silhouette is normal. No pleural effusions or focal consolidations. Mild bibasilar interstitial prominence. Trachea projects midline and there is no pneumothorax. Soft tissue planes and included osseous structures are non-suspicious. IMPRESSION: Bibasilar interstitial prominence could represent atelectasis or atypical infection without focal consolidation. Electronically Signed   By: Awilda Metro M.D.   On: 10/23/2016 22:42    Procedures Procedures (including critical care time)  Medications Ordered in ED Medications  pantoprazole (PROTONIX) injection 40 mg (not administered)  famotidine (PEPCID) IVPB 20 mg premix (not administered)  morphine  4 MG/ML injection 4 mg (4 mg Intravenous Given 10/23/16 2139)  gi cocktail (Maalox,Lidocaine,Donnatal) (30 mLs Oral Given 10/23/16 2139)  sodium chloride 0.9 % 1,000 mL with potassium chloride 80 mEq infusion ( Intravenous New Bag/Given 10/23/16 2243)  potassium chloride SA (K-DUR,KLOR-CON) CR tablet 40 mEq (40 mEq Oral Given 10/23/16 2229)     Initial Impression / Assessment and Plan / ED Course  I have reviewed the triage vital signs and the nursing notes.  Pertinent labs & imaging results that were available during my care of the patient were reviewed by me and considered in my medical decision making (see chart for details).    BAZIL DHANANI is a 51 y.o. male here with chest pain. Radiate to the right arm. Afebrile, not tachycardic. I doubt dissection or PE. He has hx of chronic pancreatitis. Will get labs, trop x 2, lipase. I think likely pancreatitis vs ACS vs gastritis.  11:34 PM Trop neg x 1. Pain improved. CXR showed atelectasis, possible atypical infection. However, he is afebrile, has nl WBC count and has nl CD4 count and has no cough so will not give abx for now. Signed out to Dr. Don Perking in the ED. Second trop at 12:30 am. If neg, anticipate discharge. Likely gastritis. Can be started on PPI.   Final Clinical  Impressions(s) / ED Diagnoses   Final diagnoses:  None    New Prescriptions New Prescriptions   No medications on file     Charlynne Pander, MD 10/23/16 2347    Charlynne Pander, MD 10/24/16 [phone]

## 2016-10-23 NOTE — ED Triage Notes (Signed)
Pt brought in by [facility] for complaint of right chest pain radiating to right arm and leg. Pt states he was eating dinner and began to have chest pain. EMS gave patient 324 aspirin, EKG NSR, VS-129/86, HR-67, 98% O2. Pt in no apparent distress at this time. Respirations even and unlabored. Denies N/V.

## 2016-10-24 DIAGNOSIS — J189 Pneumonia, unspecified organism: Secondary | ICD-10-CM | POA: Diagnosis not present

## 2016-10-24 LAB — TROPONIN I

## 2016-10-24 MED ORDER — ASPIRIN 81 MG PO CHEW
324.0000 mg | CHEWABLE_TABLET | Freq: Once | ORAL | Status: AC
  Administered 2016-10-24: 324 mg via ORAL
  Filled 2016-10-24: qty 4

## 2016-10-24 MED ORDER — DEXTROSE 5 % IV SOLN: 1.0000 g | Freq: Once | INTRAVENOUS | Status: DC

## 2016-10-24 MED ORDER — DOXYCYCLINE HYCLATE 100 MG PO CAPS: 100.0000 mg | ORAL_CAPSULE | Freq: Two times a day (BID) | ORAL | 0 refills | Status: AC

## 2016-10-24 MED ORDER — AZITHROMYCIN 500 MG PO TABS: 500.0000 mg | ORAL_TABLET | Freq: Once | ORAL | Status: DC

## 2016-10-24 MED ORDER — DOXYCYCLINE HYCLATE 100 MG PO TABS
100.0000 mg | ORAL_TABLET | Freq: Once | ORAL | Status: AC
  Administered 2016-10-24: 100 mg via ORAL
  Filled 2016-10-24: qty 1

## 2016-10-24 MED ORDER — ACETAMINOPHEN 500 MG PO TABS
1000.0000 mg | ORAL_TABLET | Freq: Once | ORAL | Status: AC
  Administered 2016-10-24: 1000 mg via ORAL
  Filled 2016-10-24: qty 2

## 2016-10-24 MED ORDER — CEFTRIAXONE SODIUM-DEXTROSE 1-3.74 GM-% IV SOLR
1.0000 g | Freq: Once | INTRAVENOUS | Status: AC
  Administered 2016-10-24: 1 g via INTRAVENOUS
  Filled 2016-10-24: qty 50

## 2016-10-24 NOTE — Discharge Instructions (Signed)

## 2016-10-24 NOTE — ED Notes (Signed)
Pt still receiving IV antibiotic and IV sodium chloride with potassium at this time.

## 2016-10-24 NOTE — ED Notes (Signed)

## 2016-10-24 NOTE — ED Provider Notes (Signed)
-----------------------------------------   1:27 AM on 10/24/2016 -----------------------------------------   Blood pressure 126/82, pulse (!) 53, temperature 98 F (36.7 C), temperature source Oral, resp. rate 17, height  (1.651 m), weight 135 lb (61.2 kg), SpO2 94 %.  Assuming care from Dr. Silverio Lay of [facility] is a 51 y.o. male with a chief complaint of Chest Pain .    In summary, 51 y.o. male history of COPD, HIV (CD 4 578 and undetected viral load on 08/2016) who presents for evaluation of CP. Patient tells me he has had 3 days of night sweats, dry cough, shortness of breath, and sharp intermittent chest pain. Chest x-ray is concerning for pneumonia. No recent admissions to the hospital over the last 3 months. Troponin 2 is negative. EKG 2 with no evidence of ischemia. Patient was given Rocephin and doxycycline. Plan to discharge home on Doxy and close follow-up with infectious disease.  ED ECG REPORT I, Nita Sickle, the attending physician, personally viewed and interpreted this ECG.  1:25AM - sinus bradycardia, rate of 54, normal intervals, normal axis, no ST elevations or depressions.   Nita Sickle, MD 10/24/16 [phone]

## 2016-11-26 ENCOUNTER — Emergency Department
Admission: EM | Admit: 2016-11-26 | Discharge: 2016-11-26 | Disposition: A | Discharge status: Home / Self Care | Payer: Medicare Other | Attending: Emergency Medicine | Admitting: Emergency Medicine

## 2016-11-26 DIAGNOSIS — F17210 Nicotine dependence, cigarettes, uncomplicated: Secondary | ICD-10-CM | POA: Diagnosis not present

## 2016-11-26 DIAGNOSIS — Z79899 Other long term (current) drug therapy: Secondary | ICD-10-CM | POA: Diagnosis not present

## 2016-11-26 DIAGNOSIS — R1013 Epigastric pain: Secondary | ICD-10-CM

## 2016-11-26 DIAGNOSIS — I10 Essential (primary) hypertension: Secondary | ICD-10-CM | POA: Diagnosis not present

## 2016-11-26 DIAGNOSIS — Z8673 Personal history of transient ischemic attack (TIA), and cerebral infarction without residual deficits: Secondary | ICD-10-CM | POA: Diagnosis not present

## 2016-11-26 DIAGNOSIS — K859 Acute pancreatitis without necrosis or infection, unspecified: Secondary | ICD-10-CM | POA: Diagnosis not present

## 2016-11-26 DIAGNOSIS — R1010 Upper abdominal pain, unspecified: Secondary | ICD-10-CM | POA: Diagnosis present

## 2016-11-26 DIAGNOSIS — J449 Chronic obstructive pulmonary disease, unspecified: Secondary | ICD-10-CM | POA: Diagnosis not present

## 2016-11-26 LAB — CBC
HCT: 45.9 % (ref 40.0–52.0)
HEMOGLOBIN: 15.6 g/dL (ref 13.0–18.0)
MCH: 30.8 pg (ref 26.0–34.0)
MCHC: 34 g/dL (ref 32.0–36.0)
MCV: 90.5 fL (ref 80.0–100.0)
PLATELETS: 282 10*3/uL (ref 150–440)
RBC: 5.07 MIL/uL (ref 4.40–5.90)
RDW: 14.1 % (ref 11.5–14.5)
WBC: 6.6 10*3/uL (ref 3.8–10.6)

## 2016-11-26 LAB — URINALYSIS, COMPLETE (UACMP) WITH MICROSCOPIC
Bacteria, UA: NONE SEEN
Bilirubin Urine: NEGATIVE
GLUCOSE, UA: NEGATIVE mg/dL
HGB URINE DIPSTICK: NEGATIVE
Ketones, ur: 5 mg/dL — AB
NITRITE: NEGATIVE
PH: 5 (ref 5.0–8.0)
PROTEIN: NEGATIVE mg/dL
Specific Gravity, Urine: 1.024 (ref 1.005–1.030)

## 2016-11-26 LAB — COMPREHENSIVE METABOLIC PANEL
ALK PHOS: 97 U/L (ref 38–126)
ALT: 18 U/L (ref 17–63)
ANION GAP: 8 (ref 5–15)
AST: 34 U/L (ref 15–41)
Albumin: 4.2 g/dL (ref 3.5–5.0)
BILIRUBIN TOTAL: 0.8 mg/dL (ref 0.3–1.2)
BUN: 13 mg/dL (ref 6–20)
CALCIUM: 9.2 mg/dL (ref 8.9–10.3)
CO2: 23 mmol/L (ref 22–32)
CREATININE: 0.92 mg/dL (ref 0.61–1.24)
Chloride: 108 mmol/L (ref 101–111)
GFR calc non Af Amer: >60 mL/min (ref >60)
Glucose, Bld: 111 mg/dL — ABNORMAL HIGH (ref 65–99)
Potassium: 3.6 mmol/L (ref 3.5–5.1)
Sodium: 139 mmol/L (ref 135–145)
TOTAL PROTEIN: 7.5 g/dL (ref 6.5–8.1)

## 2016-11-26 LAB — LIPASE, BLOOD: Lipase: 58 U/L — ABNORMAL HIGH (ref 11–51)

## 2016-11-26 MED ORDER — OXYCODONE-ACETAMINOPHEN 5-325 MG PO TABS: 1.0000 | ORAL_TABLET | ORAL | 0 refills | Status: DC | PRN

## 2016-11-26 MED ORDER — ONDANSETRON HCL 4 MG/2ML IJ SOLN
4.0000 mg | Freq: Once | INTRAMUSCULAR | Status: AC
  Administered 2016-11-26: 4 mg via INTRAVENOUS
  Filled 2016-11-26: qty 2

## 2016-11-26 MED ORDER — OXYCODONE-ACETAMINOPHEN 5-325 MG PO TABS
2.0000 | ORAL_TABLET | Freq: Once | ORAL | Status: AC
  Administered 2016-11-26: 2 via ORAL
  Filled 2016-11-26: qty 2

## 2016-11-26 MED ORDER — MORPHINE SULFATE (PF) 4 MG/ML IV SOLN
4.0000 mg | Freq: Once | INTRAVENOUS | Status: AC
  Administered 2016-11-26: 4 mg via INTRAVENOUS
  Filled 2016-11-26: qty 1

## 2016-11-26 MED ORDER — SODIUM CHLORIDE 0.9 % IV BOLUS (SEPSIS)
1000.0000 mL | Freq: Once | INTRAVENOUS | Status: AC
  Administered 2016-11-26: 1000 mL via INTRAVENOUS

## 2016-11-26 MED ORDER — ONDANSETRON 4 MG PO TBDP: 4.0000 mg | ORAL_TABLET | Freq: Three times a day (TID) | ORAL | 0 refills | Status: DC | PRN

## 2016-11-26 NOTE — ED Provider Notes (Addendum)
[facility]: [facility] Emergency Department Provider Note  Time seen: 9:20 AM  I have reviewed the triage vital signs and the nursing notes.   HISTORY  Chief Complaint Abdominal Pain    HPI Mason Martin is a 51 y.o. male with a past medical history of COPD, gastric reflux, pancreatitis, alcohol use, presents to the emergency department for upper abdominal pain. According to the patient for the past 3 days he has been experiencing upper abdominal pain, states it is much worse over the past 24 hours. Describes as a 10/10 burning sensation to the upper abdomen consistent with past episodes of pancreatitis per patient. Patient does admit to drinking alcohol 3 days ago, even though he knows he is not supposed to. States nausea and vomiting, had one episode of diarrhea.   Past Medical History:  Diagnosis Date  . 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 COPD (chronic obstructive pulmonary disease) (HCC)   . GERD (gastroesophageal reflux disease)   . History of meningitis   . HIV disease (HCC)    on HAART, [email or web address]  . Hypertension   . Neck pain    with left arm radiculopathy  . Pancreas cyst   . Pancreatitis    chronic pancreatitis  . TIA (transient ischemic attack)   . Tobacco use     Patient Active Problem List   Diagnosis Date Noted  . Acute pancreatitis 07/01/2015    Past Surgical History:  Procedure Laterality Date  . ESOPHAGEAL ATRESIA REPAIR      Prior to Admission medications   Medication Sig Start Date End Date Taking? Authorizing Provider  cetirizine (ZYRTEC) 10 MG tablet Take 10 mg by mouth daily. 04/27/15   [provider]  CREON 24000 units CPEP Take 24,000 Units by mouth 3 (three) times daily with meals. 06/22/15   [provider]  cyclobenzaprine (FLEXERIL) 5 MG tablet Take 1 tablet (5 mg total) by mouth 3 (three) times daily as needed for muscle spasms. 08/26/16   Menshew, Charlesetta IvoryJenise V Bacon, PA-C   diclofenac (VOLTAREN) 75 MG EC tablet Take 1 tablet (75 mg total) by mouth 2 (two) times daily. 08/26/16   Menshew, Charlesetta IvoryJenise V Bacon, PA-C  enalapril (VASOTEC) 20 MG tablet Take 20 mg by mouth daily. 06/22/15   [provider]  gabapentin (NEURONTIN) 300 MG capsule Take 300 mg by mouth 2 (two) times daily.    [provider]  NEXIUM 40 MG capsule Take 40 mg by mouth daily before breakfast. 06/22/15   [provider]  Olopatadine HCl 0.2 % SOLN Apply 1 drop to eye daily. Patient not taking: Reported on 09/24/2016 04/24/16   Kem Boroughsriplett, Cari B, FNP  oxyCODONE-acetaminophen (ROXICET) 5-325 MG tablet Take 1 tablet by mouth every 6 (six) hours as needed. 09/24/16 09/24/17  Arnaldo NatalMalinda, Paul F, MD  PREZCOBIX 800-150 MG tablet Take 1 tablet by mouth daily.    [provider]  TIVICAY 50 MG tablet Take 50 mg by mouth daily. 06/22/15   [provider]  triamcinolone cream (KENALOG) 0.5 % Apply 1 application topically 2 (two) times daily. 06/22/15   [provider]  VENTOLIN HFA 108 (90 Base) MCG/ACT inhaler Inhale 2 puffs into the lungs 4 (four) times daily as needed. For shortness of breath and/or wheezing. 06/22/15   [provider]    Allergies  Allergen Reactions  . Sulfur Hives    Family History  Problem Relation Age of  Onset  . Heart failure Father   . Emphysema Father     Social History Social History  Substance Use Topics  . Smoking status: Current Every Day Smoker    Packs/day: 0.50    Types: Cigarettes  . Smokeless tobacco: Never Used  . Alcohol use 0.0 oz/week    Review of Systems Constitutional: Negative for fever. Eyes: Negative for visual changes. ENT: Negative for congestion Cardiovascular: Negative for chest pain. Respiratory: Negative for shortness of breath. Gastrointestinal: Significant upper abdominal pain. Positive for nausea or vomiting and diarrhea Genitourinary: Negative for dysuria. Musculoskeletal:  Negative for back pain. Skin: Negative for rash. Neurological: Negative for headache All other ROS negative  ____________________________________________   PHYSICAL EXAM:  VITAL SIGNS: ED Triage Vitals  Enc Vitals Group     BP 11/26/16 0848 (!) 154/107     Pulse Rate 11/26/16 0848 78     Resp 11/26/16 0848 16     Temp 11/26/16 0848 98.7 F (37.1 C)     Temp Source 11/26/16 0848 Oral     SpO2 11/26/16 0848 100 %     Weight 11/26/16 0847 130 lb (59 kg)     Height 11/26/16 0847 5\' 4"  (1.626 m)     Head Circumference --      Peak Flow --      Pain Score 11/26/16 0847 10     Pain Loc --      Pain Edu? --      Excl. in GC? --     Constitutional: Alert and oriented. Well appearing and in no distress. Eyes: Normal exam ENT   Head: Normocephalic and atraumatic.   Mouth/Throat: Mucous membranes are moist. Cardiovascular: Normal rate, regular rhythm. No murmur Respiratory: Normal respiratory effort without tachypnea nor retractions. Breath sounds are clear Gastrointestinal: Soft, moderate epigastric tenderness to palpation. No rebound or guarding. No distention. Musculoskeletal: Nontender with normal range of motion in all extremities. Neurologic:  Normal speech and language. No gross focal neurologic deficits Skin:  Skin is warm, dry and intact.  Psychiatric: Mood and affect are normal.    INITIAL IMPRESSION / ASSESSMENT AND PLAN / ED COURSE  Pertinent labs & imaging results that were available during my care of the patient were reviewed by me and considered in my medical decision making (see chart for details).  Moderate epigastric tenderness to palpation, 10/10 burning sensation per patient. Consistent with past episodes of acute pancreatitis per patient. Patient does admit to drinking alcohol 3 days ago just prior to the pains onset. We will check labs, treat pain and nausea, IV hydrate while awaiting lab results. Exam is also consistent with  pancreatitis.   Patient's lipase is slightly elevated. Patient states his pain was relieved after pain medication but it is wearing off and coming back. We will dose oral pain medication in the emergency department. I highly suspect the patient's pain is due to acute pancreatitis. No lower abdominal tenderness and otherwise normal labs. We will discharge a short course of pain and nausea medication. I discussed a clear liquid diet for the next 48 hours with the patient and then resuming a bland diet. Patient agreeable to this plan.  ____________________________________________   FINAL CLINICAL IMPRESSION(S) / ED DIAGNOSES  Epigastric pain Pancreatitis   Minna Antis, MD 11/26/16 1229    Minna Antis, MD 11/26/16 1229

## 2016-11-26 NOTE — ED Notes (Signed)
Esign not working pt verbalized understanding of discharge instructions. No questions at this time.

## 2016-11-26 NOTE — ED Notes (Signed)
Pt encouraged to given urine sample at this time.

## 2016-11-26 NOTE — ED Triage Notes (Addendum)
C/O abdominal pain x 2 days.  Patient states he has history of pancreatitis and feels that this is why he is hurting.  C/O N/V/D.  Patient states he did drink some alcohol this weekend.  Patient is AAOx3.  Skin warm and dry.  Posture upright and relaxed in triage.  NAD.

## 2017-01-22 MED ORDER — TRIAMCINOLONE ACETONIDE 0.5 % TOPICAL CREAM
0 refills | 0 days | Status: CP
Start: 2017-01-22 — End: 2017-02-19

## 2017-02-05 MED ORDER — TIVICAY 50 MG TABLET
ORAL_TABLET | 11 refills | 0 days | Status: CP
Start: 2017-02-05 — End: 2017-02-17

## 2017-02-17 ENCOUNTER — Ambulatory Visit
Admission: RE | Admit: 2017-02-17 | Discharge: 2017-02-17 | Payer: MEDICARE | Attending: Infectious Disease | Admitting: Infectious Disease

## 2017-02-17 DIAGNOSIS — B20 Human immunodeficiency virus [HIV] disease: Principal | ICD-10-CM

## 2017-02-17 DIAGNOSIS — K2270 Barrett's esophagus without dysplasia: Secondary | ICD-10-CM

## 2017-02-17 DIAGNOSIS — F1010 Alcohol abuse, uncomplicated: Secondary | ICD-10-CM

## 2017-02-17 MED ORDER — ESOMEPRAZOLE MAGNESIUM 40 MG CAPSULE,DELAYED RELEASE
ORAL_CAPSULE | Freq: Every day | ORAL | 11 refills | 0.00000 days | Status: CP
Start: 2017-02-17 — End: 2017-09-05

## 2017-02-17 MED ORDER — DARUNAVIR 800 MG-COBICISTAT 150 MG TABLET
ORAL_TABLET | Freq: Every day | ORAL | 11 refills | 0 days | Status: CP
Start: 2017-02-17 — End: 2018-03-04

## 2017-02-17 MED ORDER — DOLUTEGRAVIR 50 MG TABLET
ORAL_TABLET | Freq: Every day | ORAL | 11 refills | 0.00000 days | Status: CP
Start: 2017-02-17 — End: 2018-03-04

## 2017-02-20 MED ORDER — CREON 24,000-76,000-120,000 UNIT CAPSULE,DELAYED RELEASE
ORAL_CAPSULE | 11 refills | 0 days | Status: CP
Start: 2017-02-20 — End: 2018-03-04

## 2017-02-20 MED ORDER — TRIAMCINOLONE ACETONIDE 0.5 % TOPICAL CREAM
5 refills | 0 days | Status: CP
Start: 2017-02-20 — End: 2017-07-07

## 2017-02-25 ENCOUNTER — Ambulatory Visit
Admission: RE | Admit: 2017-02-25 | Discharge: 2017-02-25 | Disposition: A | Discharge status: Home / Self Care | Payer: MEDICARE

## 2017-02-25 DIAGNOSIS — F1010 Alcohol abuse, uncomplicated: Secondary | ICD-10-CM

## 2017-02-25 DIAGNOSIS — B20 Human immunodeficiency virus [HIV] disease: Principal | ICD-10-CM

## 2017-03-18 ENCOUNTER — Inpatient Hospital Stay
Admission: EM | Admit: 2017-03-18 | Discharge: 2017-03-20 | DRG: 438 | Disposition: A | Discharge status: Home / Self Care | Payer: Medicare Other | Attending: Internal Medicine | Admitting: Internal Medicine

## 2017-03-18 ENCOUNTER — Encounter: Payer: Self-pay | Admitting: *Deleted

## 2017-03-18 DIAGNOSIS — Z8661 Personal history of infections of the central nervous system: Secondary | ICD-10-CM

## 2017-03-18 DIAGNOSIS — F17210 Nicotine dependence, cigarettes, uncomplicated: Secondary | ICD-10-CM | POA: Diagnosis present

## 2017-03-18 DIAGNOSIS — K219 Gastro-esophageal reflux disease without esophagitis: Secondary | ICD-10-CM | POA: Diagnosis present

## 2017-03-18 DIAGNOSIS — Z79899 Other long term (current) drug therapy: Secondary | ICD-10-CM | POA: Diagnosis not present

## 2017-03-18 DIAGNOSIS — K859 Acute pancreatitis without necrosis or infection, unspecified: Principal | ICD-10-CM

## 2017-03-18 DIAGNOSIS — Z8249 Family history of ischemic heart disease and other diseases of the circulatory system: Secondary | ICD-10-CM

## 2017-03-18 DIAGNOSIS — B20 Human immunodeficiency virus [HIV] disease: Secondary | ICD-10-CM | POA: Diagnosis present

## 2017-03-18 DIAGNOSIS — R1013 Epigastric pain: Secondary | ICD-10-CM | POA: Diagnosis present

## 2017-03-18 DIAGNOSIS — E876 Hypokalemia: Secondary | ICD-10-CM | POA: Diagnosis present

## 2017-03-18 DIAGNOSIS — Z716 Tobacco abuse counseling: Secondary | ICD-10-CM | POA: Diagnosis not present

## 2017-03-18 DIAGNOSIS — I10 Essential (primary) hypertension: Secondary | ICD-10-CM | POA: Diagnosis present

## 2017-03-18 DIAGNOSIS — K861 Other chronic pancreatitis: Secondary | ICD-10-CM | POA: Diagnosis present

## 2017-03-18 DIAGNOSIS — J449 Chronic obstructive pulmonary disease, unspecified: Secondary | ICD-10-CM | POA: Diagnosis present

## 2017-03-18 DIAGNOSIS — Z825 Family history of asthma and other chronic lower respiratory diseases: Secondary | ICD-10-CM

## 2017-03-18 DIAGNOSIS — Z8673 Personal history of transient ischemic attack (TIA), and cerebral infarction without residual deficits: Secondary | ICD-10-CM

## 2017-03-18 LAB — URINALYSIS, COMPLETE (UACMP) WITH MICROSCOPIC
Bacteria, UA: NONE SEEN
Bilirubin Urine: NEGATIVE
GLUCOSE, UA: NEGATIVE mg/dL
HGB URINE DIPSTICK: NEGATIVE
Ketones, ur: NEGATIVE mg/dL
NITRITE: NEGATIVE
PH: 5 (ref 5.0–8.0)
Protein, ur: NEGATIVE mg/dL
SPECIFIC GRAVITY, URINE: 1.023 (ref 1.005–1.030)

## 2017-03-18 LAB — CBC
HCT: 40.8 % (ref 40.0–52.0)
Hemoglobin: 14.1 g/dL (ref 13.0–18.0)
MCH: 31.5 pg (ref 26.0–34.0)
MCHC: 34.5 g/dL (ref 32.0–36.0)
MCV: 91.2 fL (ref 80.0–100.0)
Platelets: 316 10*3/uL (ref 150–440)
RBC: 4.48 MIL/uL (ref 4.40–5.90)
RDW: 14.4 % (ref 11.5–14.5)
WBC: 6.5 10*3/uL (ref 3.8–10.6)

## 2017-03-18 LAB — COMPREHENSIVE METABOLIC PANEL
ALBUMIN: 3.9 g/dL (ref 3.5–5.0)
ALK PHOS: 81 U/L (ref 38–126)
ALT: 12 U/L — ABNORMAL LOW (ref 17–63)
ANION GAP: 13 (ref 5–15)
AST: 26 U/L (ref 15–41)
BILIRUBIN TOTAL: 0.7 mg/dL (ref 0.3–1.2)
BUN: 7 mg/dL (ref 6–20)
CALCIUM: 9.3 mg/dL (ref 8.9–10.3)
CO2: 22 mmol/L (ref 22–32)
Chloride: 103 mmol/L (ref 101–111)
Creatinine, Ser: 0.86 mg/dL (ref 0.61–1.24)
GFR calc Af Amer: >60 mL/min (ref >60)
GLUCOSE: 110 mg/dL — AB (ref 65–99)
Potassium: 3.3 mmol/L — ABNORMAL LOW (ref 3.5–5.1)
Sodium: 138 mmol/L (ref 135–145)
TOTAL PROTEIN: 7 g/dL (ref 6.5–8.1)

## 2017-03-18 LAB — LIPID PANEL
CHOL/HDL RATIO: 3.2 ratio
Cholesterol: 205 mg/dL — ABNORMAL HIGH (ref 0–200)
HDL: 64 mg/dL (ref >40)
LDL CALC: 113 mg/dL — AB (ref 0–99)
Triglycerides: 138 mg/dL (ref <150)
VLDL: 28 mg/dL (ref 0–40)

## 2017-03-18 LAB — LIPASE, BLOOD: Lipase: 140 U/L — ABNORMAL HIGH (ref 11–51)

## 2017-03-18 MED ORDER — HEPARIN SODIUM (PORCINE) 5000 UNIT/ML IJ SOLN
5000.0000 [IU] | Freq: Three times a day (TID) | INTRAMUSCULAR | Status: DC
  Administered 2017-03-18 – 2017-03-19 (×5): 5000 [IU] via SUBCUTANEOUS
  Filled 2017-03-18 (×5): qty 1

## 2017-03-18 MED ORDER — SODIUM CHLORIDE 0.9 % IV BOLUS (SEPSIS)
1000.0000 mL | Freq: Once | INTRAVENOUS | Status: AC
  Administered 2017-03-18: 1000 mL via INTRAVENOUS

## 2017-03-18 MED ORDER — ONDANSETRON HCL 4 MG/2ML IJ SOLN: 4.0000 mg | Freq: Four times a day (QID) | INTRAMUSCULAR | Status: DC | PRN

## 2017-03-18 MED ORDER — HYDROMORPHONE HCL 1 MG/ML IJ SOLN
1.0000 mg | Freq: Once | INTRAMUSCULAR | Status: AC
  Administered 2017-03-18: 1 mg via INTRAVENOUS
  Filled 2017-03-18: qty 1

## 2017-03-18 MED ORDER — PANCRELIPASE (LIP-PROT-AMYL) 12000-38000 UNITS PO CPEP
24000.0000 [IU] | ORAL_CAPSULE | Freq: Three times a day (TID) | ORAL | Status: DC
  Administered 2017-03-19 – 2017-03-20 (×3): 24000 [IU] via ORAL
  Filled 2017-03-18 (×3): qty 2

## 2017-03-18 MED ORDER — DOLUTEGRAVIR SODIUM 50 MG PO TABS
50.0000 mg | ORAL_TABLET | Freq: Every day | ORAL | Status: DC
  Administered 2017-03-18 – 2017-03-20 (×3): 50 mg via ORAL
  Filled 2017-03-18 (×3): qty 1

## 2017-03-18 MED ORDER — MORPHINE SULFATE (PF) 2 MG/ML IV SOLN
2.0000 mg | INTRAVENOUS | Status: DC | PRN
  Administered 2017-03-18 – 2017-03-19 (×4): 2 mg via INTRAVENOUS
  Filled 2017-03-18 (×4): qty 1

## 2017-03-18 MED ORDER — GABAPENTIN 300 MG PO CAPS
300.0000 mg | ORAL_CAPSULE | Freq: Two times a day (BID) | ORAL | Status: DC
  Administered 2017-03-18 – 2017-03-20 (×4): 300 mg via ORAL
  Filled 2017-03-18 (×4): qty 1

## 2017-03-18 MED ORDER — ENALAPRIL MALEATE 10 MG PO TABS
20.0000 mg | ORAL_TABLET | Freq: Every day | ORAL | Status: DC
  Administered 2017-03-18 – 2017-03-20 (×3): 20 mg via ORAL
  Filled 2017-03-18 (×2): qty 2
  Filled 2017-03-18: qty 1

## 2017-03-18 MED ORDER — POTASSIUM CHLORIDE IN NACL 40-0.9 MEQ/L-% IV SOLN
INTRAVENOUS | Status: AC
  Administered 2017-03-18: 18:00:00 125 mL/h via INTRAVENOUS
  Filled 2017-03-18 (×2): qty 1000

## 2017-03-18 MED ORDER — DARUNAVIR-COBICISTAT 800-150 MG PO TABS
1.0000 | ORAL_TABLET | Freq: Every day | ORAL | Status: DC
  Administered 2017-03-18 – 2017-03-20 (×3): 1 via ORAL
  Filled 2017-03-18 (×4): qty 1

## 2017-03-18 MED ORDER — OXYCODONE-ACETAMINOPHEN 5-325 MG PO TABS
1.0000 | ORAL_TABLET | ORAL | Status: DC | PRN
  Administered 2017-03-18: 17:00:00 1 via ORAL
  Filled 2017-03-18: qty 1

## 2017-03-18 MED ORDER — ALBUTEROL SULFATE (2.5 MG/3ML) 0.083% IN NEBU
2.5000 mg | INHALATION_SOLUTION | Freq: Four times a day (QID) | RESPIRATORY_TRACT | Status: DC | PRN
  Administered 2017-03-19: 22:00:00 2.5 mg via RESPIRATORY_TRACT
  Filled 2017-03-18: qty 3

## 2017-03-18 MED ORDER — PANTOPRAZOLE SODIUM 40 MG IV SOLR
40.0000 mg | INTRAVENOUS | Status: DC
  Administered 2017-03-18 – 2017-03-19 (×2): 40 mg via INTRAVENOUS
  Filled 2017-03-18 (×2): qty 40

## 2017-03-18 MED ORDER — TRIAMCINOLONE ACETONIDE 0.1 % EX CREA
TOPICAL_CREAM | Freq: Two times a day (BID) | CUTANEOUS | Status: DC
  Administered 2017-03-18 – 2017-03-19 (×2): via TOPICAL
  Administered 2017-03-19: 1 via TOPICAL
  Filled 2017-03-18: qty 15

## 2017-03-18 MED ORDER — DOCUSATE SODIUM 100 MG PO CAPS: 100.0000 mg | ORAL_CAPSULE | Freq: Two times a day (BID) | ORAL | Status: DC | PRN

## 2017-03-18 MED ORDER — SODIUM CHLORIDE 0.9 % IV SOLN: INTRAVENOUS | Status: DC

## 2017-03-18 NOTE — ED Provider Notes (Signed)
[facility] Emergency Department Provider Note  ____________________________________________  Time seen: Approximately 1:24 PM  I have reviewed the triage vital signs and the nursing notes.   HISTORY  Chief Complaint Abdominal Pain    HPI Mason Martin is a 51 y.o. male who complains of generalized abdominal pain started early this morning associated with nausea and vomiting. Worse in the upper abdomen. Feels like pancreatitis he's had in the past. Radiates up into the chest. No aggravating or alleviating factors. Has not tried eating today. Rates it as severe, 9 out of 10. Sharp.     Past Medical History:  Diagnosis Date  . 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 COPD (chronic obstructive pulmonary disease) (HCC)   . GERD (gastroesophageal reflux disease)   . History of meningitis   . HIV disease (HCC)    on HAART, follows with ID at [facility]  . Hypertension   . Neck pain    with left arm radiculopathy  . Pancreas cyst   . Pancreatitis    chronic pancreatitis  . TIA (transient ischemic attack)   . Tobacco use      Patient Active Problem List   Diagnosis Date Noted  . Acute pancreatitis 07/01/2015     Past Surgical History:  Procedure Laterality Date  . ESOPHAGEAL ATRESIA REPAIR       Prior to Admission medications   Medication Sig Start Date End Date Taking? Authorizing Provider  cetirizine (ZYRTEC) 10 MG tablet Take 10 mg by mouth daily. 04/27/15   [provider]  CREON 24000 units CPEP Take 24,000 Units by mouth 3 (three) times daily with meals. 06/22/15   [provider]  cyclobenzaprine (FLEXERIL) 5 MG tablet Take 1 tablet (5 mg total) by mouth 3 (three) times daily as needed for muscle spasms. Patient not taking: Reported on 11/26/2016 08/26/16   Menshew, Charlesetta Ivory, PA-C  diclofenac (VOLTAREN) 75 MG EC tablet Take 1 tablet (75 mg total) by mouth 2 (two) times daily. Patient not taking: Reported  on 11/26/2016 08/26/16   Menshew, Charlesetta Ivory, PA-C  enalapril (VASOTEC) 20 MG tablet Take 20 mg by mouth daily. 06/22/15   [provider]  gabapentin (NEURONTIN) 300 MG capsule Take 300 mg by mouth 2 (two) times daily.    [provider]  NEXIUM 40 MG capsule Take 40 mg by mouth daily before breakfast. 06/22/15   [provider]  Olopatadine HCl 0.2 % SOLN Apply 1 drop to eye daily. Patient not taking: Reported on 09/24/2016 04/24/16   Kem Boroughs B, FNP  ondansetron (ZOFRAN ODT) 4 MG disintegrating tablet Take 1 tablet (4 mg total) by mouth every 8 (eight) hours as needed for nausea or vomiting. 11/26/16   Minna Antis, MD  oxyCODONE-acetaminophen (ROXICET) 5-325 MG tablet Take 1 tablet by mouth every 4 (four) hours as needed for severe pain. 11/26/16   Minna Antis, MD  PREZCOBIX 800-150 MG tablet Take 1 tablet by mouth daily.    [provider]  TIVICAY 50 MG tablet Take 50 mg by mouth daily. 06/22/15   [provider]  triamcinolone cream (KENALOG) 0.5 % Apply 1 application topically 2 (two) times daily. 06/22/15   [provider]  VENTOLIN HFA 108 (90 Base) MCG/ACT inhaler Inhale 2 puffs into the lungs 4 (four) times daily as needed. For shortness of breath and/or wheezing. 06/22/15   [provider]     Allergies Sulfur  Family History  Problem Relation Age of Onset  . Heart failure Father   . Emphysema Father     Social History Social History  Substance Use Topics  . Smoking status: Current Every Day Smoker    Packs/day: 0.50    Types: Cigarettes  . Smokeless tobacco: Never Used  . Alcohol use 0.0 oz/week    Review of Systems  Constitutional:   No fever or chills.  ENT:   No sore throat. No rhinorrhea. Cardiovascular:   No chest pain or syncope. Respiratory:   No dyspnea or cough. Gastrointestinal:   positive as above for abdominal pain and vomiting, no diarrhea or constipation.   Musculoskeletal:   Negative for focal pain or swelling All other systems reviewed and are negative except as documented above in ROS and HPI.  ____________________________________________   PHYSICAL EXAM:  VITAL SIGNS: ED Triage Vitals  Enc Vitals Group     BP 03/18/17 1037 (!) 161/101     Pulse Rate 03/18/17 1037 82     Resp 03/18/17 1037 18     Temp 03/18/17 1037 98.4 F (36.9 C)     Temp Source 03/18/17 1037 Oral     SpO2 03/18/17 1037 98 %     Weight 03/18/17 1038 131 lb (59.4 kg)     Height 03/18/17 1038  (1.626 m)     Head Circumference --      Peak Flow --      Pain Score 03/18/17 1037 10     Pain Loc --      Pain Edu? --      Excl. in GC? --     Vital signs reviewed, nursing assessments reviewed.   Constitutional:   Alert and oriented. uncomfortable but not in distress. Eyes:   No scleral icterus.  EOMI. No nystagmus. No conjunctival pallor. PERRL. ENT   Head:   Normocephalic and atraumatic.   Nose:   No congestion/rhinnorhea.    Mouth/Throat:   MMM, no pharyngeal erythema. No peritonsillar mass.    Neck:   No meningismus. Full ROM Hematological/Lymphatic/Immunilogical:   No cervical lymphadenopathy. Cardiovascular:   RRR. Symmetric bilateral radial and DP pulses.  No murmurs.  Respiratory:   Normal respiratory effort without tachypnea/retractions. Breath sounds are clear and equal bilaterally. No wheezes/rales/rhonchi. Gastrointestinal:   Soft with significant epigastric and left upper quadrant tenderness Non distended. There is no CVA tenderness.  No rebound, rigidity, or guarding. Genitourinary:   deferred Musculoskeletal:   Normal range of motion in all extremities. No joint effusions.  No lower extremity tenderness.  No edema. Neurologic:   Normal speech and language.  Motor grossly intact. No gross focal neurologic deficits are appreciated.  Skin:    Skin is warm, dry and intact. No rash noted.  No petechiae, purpura, or  bullae.  ____________________________________________    LABS (pertinent positives/negatives) (all labs ordered are listed, but only abnormal results are displayed) Labs Reviewed  LIPASE, BLOOD - Abnormal; Notable for the following:       Result Value   Lipase 140 (*)    All other components within normal limits  COMPREHENSIVE METABOLIC PANEL - Abnormal; Notable for the following:    Potassium 3.3 (*)    Glucose, Bld 110 (*)    ALT 12 (*)    All other components within normal limits  CBC  URINALYSIS, COMPLETE (UACMP) WITH MICROSCOPIC  LIPID PANEL   ____________________________________________   EKG    ____________________________________________    RADIOLOGY  No  results found.  ____________________________________________   PROCEDURES Procedures  ____________________________________________   INITIAL IMPRESSION / ASSESSMENT AND PLAN / ED COURSE  Pertinent labs & imaging results that were available during my care of the patient were reviewed by me and considered in my medical decision making (see chart for details).  patient presents with abdominal pain, similar to his experience with pancreatitis in the past. Vitals unremarkable, labs do show elevated lipase. White blood cell count is normal, presentation is not consistent with cholecystitis or choledocholithiasis.no recent trauma. Patient does drink heavily on the weekends, last alcohol 2 days ago. Clinically not worrisome for an infected pseudocyst or hemorrhage. Doubt perforation or obstruction. With severe pain, we'll give IV fluids, Dilaudid. Case discussed with hospitalist for further management.      ____________________________________________   FINAL CLINICAL IMPRESSION(S) / ED DIAGNOSES  Final diagnoses:  Epigastric pain  Acute pancreatitis, unspecified complication status, unspecified pancreatitis type      New Prescriptions   No medications on file     Portions of this note were  generated with dragon dictation software. Dictation errors may occur despite best attempts at proofreading.    Sharman Cheek, MD 03/18/17 [phone]

## 2017-03-18 NOTE — ED Triage Notes (Signed)
States general abd pain that began this AM, states nausea and 1 episidoe of vomiting, states hx of pancreatitis and states it feels the same, hx of hiv

## 2017-03-18 NOTE — H&P (Signed)
[facility] [place] at [facility]   PATIENT NAME: Mason Martin    MR#:  [id number]  DATE OF BIRTH:  04-11-1966  DATE OF ADMISSION:  03/18/2017  PRIMARY CARE PHYSICIAN: [facility]   REQUESTING/REFERRING PHYSICIAN: Scotty Court  CHIEF COMPLAINT:   Chief Complaint  Patient presents with  . Abdominal Pain    HISTORY OF PRESENT ILLNESS: Mason Martin  is a 51 y.o. male with a known history of COPD, GERD, HIV, Pancreatitis, Htn- came with abd pain since 2 am, nausea - did find High lipase in ER. He drinks ETOH , but last was 2 days ago a few beers. Given to admit for ac pancreatitis.  PAST MEDICAL HISTORY:   Past Medical History:  Diagnosis Date  . 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 COPD (chronic obstructive pulmonary disease) (HCC)   . GERD (gastroesophageal reflux disease)   . History of meningitis   . HIV disease (HCC)    on HAART, follows with ID at [facility]  . Hypertension   . Neck pain    with left arm radiculopathy  . Pancreas cyst   . Pancreatitis    chronic pancreatitis  . TIA (transient ischemic attack)   . Tobacco use     PAST SURGICAL HISTORY: Past Surgical History:  Procedure Laterality Date  . ESOPHAGEAL ATRESIA REPAIR      SOCIAL HISTORY:  Social History  Substance Use Topics  . Smoking status: Current Every Day Smoker    Packs/day: 0.50    Types: Cigarettes  . Smokeless tobacco: Never Used  . Alcohol use 0.0 oz/week    FAMILY HISTORY:  Family History  Problem Relation Age of Onset  . Heart failure Father   . Emphysema Father     DRUG ALLERGIES:  Allergies  Allergen Reactions  . Sulfur Hives    REVIEW OF SYSTEMS:   CONSTITUTIONAL: No fever, fatigue or weakness.  EYES: No blurred or double vision.  EARS, NOSE, AND THROAT: No tinnitus or ear pain.  RESPIRATORY: No cough, shortness of breath, wheezing or hemoptysis.  CARDIOVASCULAR: No chest pain, orthopnea, edema.  GASTROINTESTINAL:  positive for nausea, vomiting, abdominal pain. No diarrhea. GENITOURINARY: No dysuria, hematuria.  ENDOCRINE: No polyuria, nocturia,  HEMATOLOGY: No anemia, easy bruising or bleeding SKIN: No rash or lesion. MUSCULOSKELETAL: No joint pain or arthritis.   NEUROLOGIC: No tingling, numbness, weakness.  PSYCHIATRY: No anxiety or depression.   MEDICATIONS AT HOME:  Prior to Admission medications   Medication Sig Start Date End Date Taking? Authorizing Provider  cetirizine (ZYRTEC) 10 MG tablet Take 10 mg by mouth daily. 04/27/15   [provider]  CREON 24000 units CPEP Take 24,000 Units by mouth 3 (three) times daily with meals. 06/22/15   [provider]  cyclobenzaprine (FLEXERIL) 5 MG tablet Take 1 tablet (5 mg total) by mouth 3 (three) times daily as needed for muscle spasms. Patient not taking: Reported on 11/26/2016 08/26/16   Menshew, Charlesetta Ivory, PA-C  diclofenac (VOLTAREN) 75 MG EC tablet Take 1 tablet (75 mg total) by mouth 2 (two) times daily. Patient not taking: Reported on 11/26/2016 08/26/16   Menshew, Charlesetta Ivory, PA-C  enalapril (VASOTEC) 20 MG tablet Take 20 mg by mouth daily. 06/22/15   [provider]  gabapentin (NEURONTIN) 300 MG capsule Take 300 mg by mouth 2 (two) times daily.    [provider]  NEXIUM 40 MG capsule Take 40  mg by mouth daily before breakfast. 06/22/15   [provider]  Olopatadine HCl 0.2 % SOLN Apply 1 drop to eye daily. Patient not taking: Reported on 09/24/2016 04/24/16   Kem Boroughs B, FNP  ondansetron (ZOFRAN ODT) 4 MG disintegrating tablet Take 1 tablet (4 mg total) by mouth every 8 (eight) hours as needed for nausea or vomiting. 11/26/16   Minna Antis, MD  oxyCODONE-acetaminophen (ROXICET) 5-325 MG tablet Take 1 tablet by mouth every 4 (four) hours as needed for severe pain. 11/26/16   Minna Antis, MD  PREZCOBIX 800-150 MG tablet Take 1 tablet by mouth daily.    [provider]   TIVICAY 50 MG tablet Take 50 mg by mouth daily. 06/22/15   [provider]  triamcinolone cream (KENALOG) 0.5 % Apply 1 application topically 2 (two) times daily. 06/22/15   [provider]  VENTOLIN HFA 108 (90 Base) MCG/ACT inhaler Inhale 2 puffs into the lungs 4 (four) times daily as needed. For shortness of breath and/or wheezing. 06/22/15   [provider]      PHYSICAL EXAMINATION:   VITAL SIGNS: Blood pressure (!) 164/89, pulse (!) 58, temperature 98.4 F (36.9 C), temperature source Oral, resp. rate 16, height  (1.626 m), weight 59.4 kg (131 lb), SpO2 97 %.  GENERAL:  51 y.o.-year-old patient lying in the bed with no acute distress.  EYES: Pupils equal, round, reactive to light and accommodation. No scleral icterus. Extraocular muscles intact.  HEENT: Head atraumatic, normocephalic. Oropharynx and nasopharynx clear.  NECK:  Supple, no jugular venous distention. No thyroid enlargement, no tenderness.  LUNGS: Normal breath sounds bilaterally, no wheezing, rales,rhonchi or crepitation. No use of accessory muscles of respiration.  CARDIOVASCULAR: S1, S2 normal. No murmurs, rubs, or gallops.  ABDOMEN: Soft, Epigastric tender, nondistended. Bowel sounds present. No organomegaly or mass.  EXTREMITIES: No pedal edema, cyanosis, or clubbing.  NEUROLOGIC: Cranial nerves II through XII are intact. Muscle strength 5/5 in all extremities. Sensation intact. Gait not checked.  PSYCHIATRIC: The patient is alert and oriented x 3.  SKIN: No obvious rash, lesion, or ulcer.   LABORATORY PANEL:   CBC  Recent Labs Lab 03/18/17 1037  WBC 6.5  HGB 14.1  HCT 40.8  PLT 316  MCV 91.2  MCH 31.5  MCHC 34.5  RDW 14.4   ------------------------------------------------------------------------------------------------------------------  Chemistries   Recent Labs Lab 03/18/17 1037  NA 138  K 3.3*  CL 103  CO2 22  GLUCOSE 110*  BUN 7  CREATININE 0.86   CALCIUM 9.3  AST 26  ALT 12*  ALKPHOS 81  BILITOT 0.7   ------------------------------------------------------------------------------------------------------------------ estimated creatinine clearance is 85.1 mL/min (by C-G formula based on SCr of 0.86 mg/dL). ------------------------------------------------------------------------------------------------------------------ No results for input(s): TSH, T4TOTAL, T3FREE, THYROIDAB in the last 72 hours.  Invalid input(s): FREET3   Coagulation profile No results for input(s): INR, PROTIME in the last 168 hours. ------------------------------------------------------------------------------------------------------------------- No results for input(s): DDIMER in the last 72 hours. -------------------------------------------------------------------------------------------------------------------  Cardiac Enzymes No results for input(s): CKMB, TROPONINI, MYOGLOBIN in the last 168 hours.  Invalid input(s): CK ------------------------------------------------------------------------------------------------------------------ Invalid input(s): POCBNP  ---------------------------------------------------------------------------------------------------------------  Urinalysis    Component Value Date/Time   COLORURINE YELLOW (A) 11/26/2016 0849   APPEARANCEUR CLEAR (A) 11/26/2016 0849   APPEARANCEUR Clear 08/04/2014 2036   LABSPEC 1.024 11/26/2016 0849   LABSPEC 1.034 08/04/2014 2036   PHURINE 5.0 11/26/2016 0849   GLUCOSEU NEGATIVE 11/26/2016 0849   GLUCOSEU Negative 08/04/2014 2036   HGBUR NEGATIVE 11/26/2016  0849   BILIRUBINUR NEGATIVE 11/26/2016 0849   BILIRUBINUR Negative 08/04/2014 2036   KETONESUR 5 (A) 11/26/2016 0849   PROTEINUR NEGATIVE 11/26/2016 0849   NITRITE NEGATIVE 11/26/2016 0849   LEUKOCYTESUR TRACE (A) 11/26/2016 0849   LEUKOCYTESUR Negative 08/04/2014 2036     RADIOLOGY: No results found.  EKG: Orders  placed or performed during the hospital encounter of 10/23/16  . EKG 12-Lead  . EKG 12-Lead  . EKG 12-Lead  . EKG 12-Lead  . EKG 12-Lead  . EKG 12-Lead    IMPRESSION AND PLAN:  * Ac pancreatitis   IV pain and nausea meds, IV fluids,   Check Lipid panel.  * Hypokalemia    Replace IV.  * HIV    Cont meds.  * Htn    COnt home meds  * Active smoking   Councelled to quit for 4 min.  All the records are reviewed and case discussed with ED provider. Management plans discussed with the patient, family and they are in agreement.  CODE STATUS: Full. Code Status History    Date Active Date Inactive Code Status Order ID Comments User Context   07/01/2015  8:05 PM 07/05/2015  5:38 PM Full Code [id number]  Enid Baas, MD Inpatient       TOTAL TIME TAKING CARE OF THIS PATIENT: 45 minutes.    Altamese Dilling M.D on 03/18/2017   Between 7am to 6pm - Pager - [phone]  After 6pm go to [email or web address] - password [email or web address]  [facility]  Office  [phone]  CC: Primary care physician; [facility]   Note: This dictation was prepared with Dragon dictation along with smaller phrase technology. Any transcriptional errors that result from this process are unintentional.

## 2017-03-19 LAB — BASIC METABOLIC PANEL
ANION GAP: 5 (ref 5–15)
BUN: <5 mg/dL — ABNORMAL LOW (ref 6–20)
CHLORIDE: 108 mmol/L (ref 101–111)
CO2: 26 mmol/L (ref 22–32)
Calcium: 8.6 mg/dL — ABNORMAL LOW (ref 8.9–10.3)
Creatinine, Ser: 0.88 mg/dL (ref 0.61–1.24)
GFR calc non Af Amer: >60 mL/min (ref >60)
Glucose, Bld: 103 mg/dL — ABNORMAL HIGH (ref 65–99)
POTASSIUM: 3.4 mmol/L — AB (ref 3.5–5.1)
Sodium: 139 mmol/L (ref 135–145)

## 2017-03-19 LAB — CBC
HCT: 37.1 % — ABNORMAL LOW (ref 40.0–52.0)
HEMOGLOBIN: 12.8 g/dL — AB (ref 13.0–18.0)
MCH: 31.5 pg (ref 26.0–34.0)
MCHC: 34.6 g/dL (ref 32.0–36.0)
MCV: 91.2 fL (ref 80.0–100.0)
Platelets: 304 10*3/uL (ref 150–440)
RBC: 4.07 MIL/uL — AB (ref 4.40–5.90)
RDW: 14.5 % (ref 11.5–14.5)
WBC: 5.3 10*3/uL (ref 3.8–10.6)

## 2017-03-19 LAB — LIPASE, BLOOD: LIPASE: 44 U/L (ref 11–51)

## 2017-03-19 MED ORDER — POTASSIUM CHLORIDE CRYS ER 20 MEQ PO TBCR
40.0000 meq | EXTENDED_RELEASE_TABLET | Freq: Once | ORAL | Status: AC
  Administered 2017-03-19: 40 meq via ORAL
  Filled 2017-03-19: qty 2

## 2017-03-19 MED ORDER — ACETAMINOPHEN 325 MG PO TABS: 650.0000 mg | ORAL_TABLET | Freq: Four times a day (QID) | ORAL | Status: DC | PRN

## 2017-03-19 NOTE — Progress Notes (Signed)
[facility] - [place] at [facility]   PATIENT NAME: Mason Martin    MR#:  [id number]  DATE OF BIRTH:  January 06, 1966  SUBJECTIVE:  CHIEF COMPLAINT:   Chief Complaint  Patient presents with  . Abdominal Pain  No abdominal pain now and wants to eat, also wants to leave REVIEW OF SYSTEMS:  Review of Systems  Constitutional: Negative for chills, fever and weight loss.  HENT: Negative for nosebleeds and sore throat.   Eyes: Negative for blurred vision.  Respiratory: Negative for cough, shortness of breath and wheezing.   Cardiovascular: Negative for chest pain, orthopnea, leg swelling and PND.  Gastrointestinal: Negative for abdominal pain, constipation, diarrhea, heartburn, nausea and vomiting.  Genitourinary: Negative for dysuria and urgency.  Musculoskeletal: Negative for back pain.  Skin: Negative for rash.  Neurological: Negative for dizziness, speech change, focal weakness and headaches.  Endo/Heme/Allergies: Does not bruise/bleed easily.  Psychiatric/Behavioral: Negative for depression.    DRUG ALLERGIES:   Allergies  Allergen Reactions  . Sulfur Hives   VITALS:  Blood pressure 115/80, pulse 85, temperature 97.8 F (36.6 C), temperature source Oral, resp. rate 14, height  (1.626 m), weight 59.4 kg (131 lb), SpO2 98 %. PHYSICAL EXAMINATION:  Physical Exam  Constitutional: He is oriented to person, place, and time and well-developed, well-nourished, and in no distress.  HENT:  Head: Normocephalic and atraumatic.  Eyes: Pupils are equal, round, and reactive to light. Conjunctivae and EOM are normal.  Neck: Normal range of motion. Neck supple. No tracheal deviation present. No thyromegaly present.  Cardiovascular: Normal rate, regular rhythm and normal heart sounds.   Pulmonary/Chest: Effort normal and breath sounds normal. No respiratory distress. He has no wheezes. He exhibits no tenderness.  Abdominal: Soft. Bowel sounds are normal. He exhibits no  distension. There is no tenderness.  Musculoskeletal: Normal range of motion.  Neurological: He is alert and oriented to person, place, and time. No cranial nerve deficit.  Skin: Skin is warm and dry. No rash noted.  Psychiatric: Mood and affect normal.   LABORATORY PANEL:  Male CBC  Recent Labs Lab 03/19/17 0600  WBC 5.3  HGB 12.8*  HCT 37.1*  PLT 304   ------------------------------------------------------------------------------------------------------------------ Chemistries   Recent Labs Lab 03/18/17 1037 03/19/17 0600  NA 138 139  K 3.3* 3.4*  CL 103 108  CO2 22 26  GLUCOSE 110* 103*  BUN 7 <5*  CREATININE 0.86 0.88  CALCIUM 9.3 8.6*  AST 26  --   ALT 12*  --   ALKPHOS 81  --   BILITOT 0.7  --    RADIOLOGY:  No results found. ASSESSMENT AND PLAN:   * Ac pancreatitis -Lipase went up initially 58 ->144 -We will recheck today and tomorrow Start him on clear liquid dietand advance as tolerated  * Hypokalemia    Replaced, check  * HIV    Cont meds.  * Htn    COnt home meds  * Active smoking   Councelled to quit for 4 min by admitting dr.     All the records are reviewed and case discussed with Care Management/Social Worker. Management plans discussed with the patient, family and they are in agreement.  CODE STATUS: Full Code  TOTAL TIME TAKING CARE OF THIS PATIENT: 25 minutes.   More than 50% of the time was spent in counseling/coordination of care: YES  POSSIBLE D/C IN 1 DAYS, DEPENDING ON CLINICAL CONDITION.   Delfino Lovett M.D on 03/19/2017 at 5:34  PM  Between 7am to 6pm - Pager - [phone]  After 6pm go to [email or web address] - Social research officer, government  [facility]  Office  [phone]  CC: Primary care physician; [facility]  Note: This dictation was prepared with Dragon dictation along with smaller phrase technology. Any transcriptional errors that result from this process are  unintentional.

## 2017-03-20 LAB — COMPREHENSIVE METABOLIC PANEL
ALK PHOS: 66 U/L (ref 38–126)
ALT: 10 U/L — AB (ref 17–63)
AST: 17 U/L (ref 15–41)
Albumin: 3.4 g/dL — ABNORMAL LOW (ref 3.5–5.0)
Anion gap: 7 (ref 5–15)
BUN: 8 mg/dL (ref 6–20)
CALCIUM: 8.8 mg/dL — AB (ref 8.9–10.3)
CHLORIDE: 107 mmol/L (ref 101–111)
CO2: 26 mmol/L (ref 22–32)
CREATININE: 0.79 mg/dL (ref 0.61–1.24)
GFR calc non Af Amer: >60 mL/min (ref >60)
GLUCOSE: 115 mg/dL — AB (ref 65–99)
Potassium: 3.7 mmol/L (ref 3.5–5.1)
SODIUM: 140 mmol/L (ref 135–145)
Total Bilirubin: 0.4 mg/dL (ref 0.3–1.2)
Total Protein: 6 g/dL — ABNORMAL LOW (ref 6.5–8.1)

## 2017-03-20 LAB — CBC
HCT: 36.6 % — ABNORMAL LOW (ref 40.0–52.0)
HEMOGLOBIN: 12.8 g/dL — AB (ref 13.0–18.0)
MCH: 32.1 pg (ref 26.0–34.0)
MCHC: 34.9 g/dL (ref 32.0–36.0)
MCV: 91.9 fL (ref 80.0–100.0)
Platelets: 275 10*3/uL (ref 150–440)
RBC: 3.98 MIL/uL — AB (ref 4.40–5.90)
RDW: 14.4 % (ref 11.5–14.5)
WBC: 5.2 10*3/uL (ref 3.8–10.6)

## 2017-03-20 LAB — LIPASE, BLOOD: Lipase: 37 U/L (ref 11–51)

## 2017-03-20 NOTE — Progress Notes (Signed)
Patient discharged home. Discharge instructions reviewed and follow up appointment highlighted. IV removed without complications. Patient refused w/c escort and ambulated to vehicle.

## 2017-03-20 NOTE — Care Management Important Message (Signed)
Important Message  Patient Details  Name: Mason Martin MRN: [id number] Date of Birth: August 04, 1965   Medicare Important Message Given:  N/A - LOS <3 / Initial given by admissions    Marily Memos, RN 03/20/2017, 8:47 AM

## 2017-03-20 NOTE — Discharge Instructions (Signed)
Acute Pancreatitis Acute pancreatitis is a condition in which the pancreas suddenly gets irritated and swollen (has inflammation). The pancreas is a large gland behind the stomach. It makes enzymes that help to digest food. The pancreas also makes hormones that help to control your blood sugar. Acute pancreatitis happens when the enzymes attack the pancreas and damage it. Most attacks last a couple of days and can cause serious problems. Follow these instructions at home: Eating and drinking  Follow instructions from your doctor about diet. You may need to: ? Avoid alcohol. ? Limit how much fat is in your diet.  Eat small meals often. Avoid eating big meals.  Drink enough fluid to keep your pee (urine) clear or pale yellow.  Do not drink alcohol if it caused your condition. General instructions  Take over-the-counter and prescription medicines only as told by your doctor.  Do not use any tobacco products. These include cigarettes, chewing tobacco, and e-cigarettes. If you need help quitting, ask your doctor.  Get plenty of rest.  If directed, check your blood sugar at home as told by your doctor.  Keep all follow-up visits as told by your doctor. This is important. Contact a doctor if:  You do not get better as quickly as expected.  You have new symptoms.  Your symptoms get worse.  You have lasting pain or weakness.  You continue to feel sick to your stomach (nauseous).  You get better and then you have another pain attack.  You have a fever. Get help right away if:  You cannot eat or keep fluids down.  Your pain becomes very bad.  Your skin or the white part of your eyes turns yellow (jaundice).  You throw up (vomit).  You feel dizzy or you pass out (faint).  Your blood sugar is high (over 300 mg/dL). This information is not intended to replace advice given to you by your health care provider. Make sure you discuss any questions you have with your health care  provider. Document Released: 12/04/2007 Document Revised: 11/23/2015 Document Reviewed: 03/21/2015 Elsevier Interactive Patient Education  2018 [employer]  

## 2017-03-21 NOTE — Discharge Summary (Signed)
[employer] - [place] at [facility]   PATIENT NAME: Mason Martin    MR#:  [id number]  DATE OF BIRTH:  1966/01/22  DATE OF ADMISSION:  03/18/2017   ADMITTING PHYSICIAN: Altamese Dilling, MD  DATE OF DISCHARGE: 03/20/2017  9:21 AM  PRIMARY CARE PHYSICIAN: [facility], [facility]   ADMISSION DIAGNOSIS:  Epigastric pain [R10.13] Acute pancreatitis, unspecified complication status, unspecified pancreatitis type [K85.90] DISCHARGE DIAGNOSIS:  Principal Problem:   Acute pancreatitis  SECONDARY DIAGNOSIS:   Past Medical History:  Diagnosis Date  . 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 COPD (chronic obstructive pulmonary disease) (HCC)   . GERD (gastroesophageal reflux disease)   . History of meningitis   . HIV disease (HCC)    on HAART, follows with ID at [facility]  . Hypertension   . Neck pain    with left arm radiculopathy  . Pancreas cyst   . Pancreatitis    chronic pancreatitis  . TIA (transient ischemic attack)   . Tobacco use    HOSPITAL COURSE:   * Ac pancreatitis -Lipase normalized and tolerated diet.  * Hypokalemia Replaced  * Active smoking Councelled to quit for 4 min by admitting dr. Kendell Bane CONDITIONS:  stable CONSULTS OBTAINED:   DRUG ALLERGIES:   Allergies  Allergen Reactions  . Sulfur Hives   DISCHARGE MEDICATIONS:   Allergies as of 03/20/2017      Reactions   Sulfur Hives      Medication List    TAKE these medications   cetirizine 10 MG tablet Commonly known as:  ZYRTEC Take 10 mg by mouth daily.   CREON 24000-76000 units Cpep Generic drug:  Pancrelipase (Lip-Prot-Amyl) Take 24,000 Units by mouth 3 (three) times daily with meals.   cyclobenzaprine 5 MG tablet Commonly known as:  FLEXERIL Take 1 tablet (5 mg total) by mouth 3 (three) times daily as needed for muscle spasms.   diclofenac 75 MG EC tablet Commonly known as:  VOLTAREN Take 1 tablet (75 mg total) by mouth 2 (two)  times daily.   enalapril 20 MG tablet Commonly known as:  VASOTEC Take 20 mg by mouth daily.   gabapentin 300 MG capsule Commonly known as:  NEURONTIN Take 300 mg by mouth 2 (two) times daily.   NEXIUM 40 MG capsule Generic drug:  esomeprazole Take 40 mg by mouth daily before breakfast.   Olopatadine HCl 0.2 % Soln Apply 1 drop to eye daily.   ondansetron 4 MG disintegrating tablet Commonly known as:  ZOFRAN ODT Take 1 tablet (4 mg total) by mouth every 8 (eight) hours as needed for nausea or vomiting.   oxyCODONE-acetaminophen 5-325 MG tablet Commonly known as:  ROXICET Take 1 tablet by mouth every 4 (four) hours as needed for severe pain.   PREZCOBIX 800-150 MG tablet Generic drug:  darunavir-cobicistat Take 1 tablet by mouth daily.   TIVICAY 50 MG tablet Generic drug:  dolutegravir Take 50 mg by mouth daily.   triamcinolone cream 0.5 % Commonly known as:  KENALOG Apply 1 application topically 2 (two) times daily.   VENTOLIN HFA 108 (90 Base) MCG/ACT inhaler Generic drug:  albuterol Inhale 2 puffs into the lungs 4 (four) times daily as needed. For shortness of breath and/or wheezing.            Discharge Care Instructions        Start     Ordered   03/20/17 0000  Increase activity  slowly     03/20/17 0658   03/20/17 0000  Diet - low sodium heart healthy     03/20/17 0658       DISCHARGE INSTRUCTIONS:   DIET:  Regular diet DISCHARGE CONDITION:  Good ACTIVITY:  Activity as tolerated OXYGEN:  Home Oxygen: No.  Oxygen Delivery: room air DISCHARGE LOCATION:  home   If you experience worsening of your admission symptoms, develop shortness of breath, life threatening emergency, suicidal or homicidal thoughts you must seek medical attention immediately by calling 911 or calling your MD immediately  if symptoms less severe.  You Must read complete instructions/literature along with all the possible adverse reactions/side effects for all the  Medicines you take and that have been prescribed to you. Take any new Medicines after you have completely understood and accpet all the possible adverse reactions/side effects.   Please note  You were cared for by a hospitalist during your hospital stay. If you have any questions about your discharge medications or the care you received while you were in the hospital after you are discharged, you can call the unit and asked to speak with the hospitalist on call if the hospitalist that took care of you is not available. Once you are discharged, your primary care physician will handle any further medical issues. Please note that NO REFILLS for any discharge medications will be authorized once you are discharged, as it is imperative that you return to your primary care physician (or establish a relationship with a primary care physician if you do not have one) for your aftercare needs so that they can reassess your need for medications and monitor your lab values.    On the day of Discharge:  VITAL SIGNS:  Blood pressure 122/73, pulse 70, temperature 98.2 F (36.8 C), resp. rate 18, height  (1.626 m), weight 59.4 kg (131 lb), SpO2 100 %. PHYSICAL EXAMINATION:  GENERAL:  51 y.o.-year-old patient lying in the bed with no acute distress.  EYES: Pupils equal, round, reactive to light and accommodation. No scleral icterus. Extraocular muscles intact.  HEENT: Head atraumatic, normocephalic. Oropharynx and nasopharynx clear.  NECK:  Supple, no jugular venous distention. No thyroid enlargement, no tenderness.  LUNGS: Normal breath sounds bilaterally, no wheezing, rales,rhonchi or crepitation. No use of accessory muscles of respiration.  CARDIOVASCULAR: S1, S2 normal. No murmurs, rubs, or gallops.  ABDOMEN: Soft, non-tender, non-distended. Bowel sounds present. No organomegaly or mass.  EXTREMITIES: No pedal edema, cyanosis, or clubbing.  NEUROLOGIC: Cranial nerves II through XII are intact. Muscle  strength 5/5 in all extremities. Sensation intact. Gait not checked.  PSYCHIATRIC: The patient is alert and oriented x 3.  SKIN: No obvious rash, lesion, or ulcer.  DATA REVIEW:   CBC  Recent Labs Lab 03/20/17 0514  WBC 5.2  HGB 12.8*  HCT 36.6*  PLT 275    Chemistries   Recent Labs Lab 03/20/17 0514  NA 140  K 3.7  CL 107  CO2 26  GLUCOSE 115*  BUN 8  CREATININE 0.79  CALCIUM 8.8*  AST 17  ALT 10*  ALKPHOS 66  BILITOT 0.4     Follow-up Information    [facility], [facility]. Schedule an appointment as soon as possible for a visit in 1 week.   Why:  Primary care doctor needs to send refferal  Contact information: [facility] [address] [place] [address] [phone]           Management plans  discussed with the patient, family and they are in agreement.  CODE STATUS: Prior   TOTAL TIME TAKING CARE OF THIS PATIENT: 45 minutes.    Delfino Lovett M.D on 03/21/2017 at 9:57 AM  Between 7am to 6pm - Pager - [phone]  After 6pm go to [email or web address] - Social research officer, government  [employer] [facility] [place] [facility]  Office  [phone]  CC: Primary care physician; [facility]   Note: This dictation was prepared with Dragon dictation along with smaller phrase technology. Any transcriptional errors that result from this process are unintentional.

## 2017-03-24 MED ORDER — ENALAPRIL MALEATE 20 MG TABLET
ORAL_TABLET | 11 refills | 0 days | Status: CP
Start: 2017-03-24 — End: 2017-06-20

## 2017-03-26 ENCOUNTER — Emergency Department
Admission: EM | Admit: 2017-03-26 | Discharge: 2017-03-26 | Disposition: A | Discharge status: Home / Self Care | Payer: Medicare Other | Attending: Emergency Medicine | Admitting: Emergency Medicine

## 2017-03-26 ENCOUNTER — Encounter: Payer: Self-pay | Admitting: Emergency Medicine

## 2017-03-26 DIAGNOSIS — Y929 Unspecified place or not applicable: Secondary | ICD-10-CM | POA: Diagnosis not present

## 2017-03-26 DIAGNOSIS — Y999 Unspecified external cause status: Secondary | ICD-10-CM | POA: Diagnosis not present

## 2017-03-26 DIAGNOSIS — W458XXA Other foreign body or object entering through skin, initial encounter: Secondary | ICD-10-CM | POA: Insufficient documentation

## 2017-03-26 DIAGNOSIS — Y939 Activity, unspecified: Secondary | ICD-10-CM | POA: Insufficient documentation

## 2017-03-26 DIAGNOSIS — S81012A Laceration without foreign body, left knee, initial encounter: Secondary | ICD-10-CM | POA: Insufficient documentation

## 2017-03-26 DIAGNOSIS — B20 Human immunodeficiency virus [HIV] disease: Secondary | ICD-10-CM | POA: Diagnosis not present

## 2017-03-26 DIAGNOSIS — F17210 Nicotine dependence, cigarettes, uncomplicated: Secondary | ICD-10-CM | POA: Diagnosis not present

## 2017-03-26 DIAGNOSIS — I10 Essential (primary) hypertension: Secondary | ICD-10-CM | POA: Diagnosis not present

## 2017-03-26 DIAGNOSIS — S8992XA Unspecified injury of left lower leg, initial encounter: Secondary | ICD-10-CM | POA: Diagnosis present

## 2017-03-26 MED ORDER — BACITRACIN ZINC 500 UNIT/GM EX OINT
TOPICAL_OINTMENT | Freq: Two times a day (BID) | CUTANEOUS | Status: DC
  Administered 2017-03-26: 1 via TOPICAL
  Filled 2017-03-26: qty 0.9

## 2017-03-26 MED ORDER — CEPHALEXIN 500 MG PO CAPS: 500.0000 mg | ORAL_CAPSULE | Freq: Three times a day (TID) | ORAL | 0 refills | Status: DC

## 2017-03-26 NOTE — Discharge Instructions (Signed)
Clean area twice a day with  mild soap and water. Watch daily for any signs of infection. Begin taking Keflex 500 mg 3 times a day for infection prevention. Take Tylenol or ibuprofen as needed for pain. Follow-up with your primary care doctor if any continued problems.

## 2017-03-26 NOTE — ED Triage Notes (Signed)
Cleaning up yard yesterday and scraped left knee on metal

## 2017-03-26 NOTE — ED Provider Notes (Signed)
[facility] Emergency Department Provider Note  ___________________________________________   First MD Initiated Contact with Patient 03/26/17 [phone]     (approximate)  I have reviewed the triage vital signs and the nursing notes.   HISTORY  Chief Complaint Wound Check  HPI Mason Martin is a 51 y.o. male states he was cut by a piece of metal while breaking down a tent. Patient explains that this was part of the tent that holds in it and place.Wife cleaning the area with alcohol last evening. This morning he is here to "have it looked at". Patient states that his last tetanus was 3 years ago.   Past Medical History:  Diagnosis Date  . 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 COPD (chronic obstructive pulmonary disease) (HCC)   . GERD (gastroesophageal reflux disease)   . History of meningitis   . HIV disease (HCC)    on HAART, follows with ID at [facility]  . Hypertension   . Neck pain    with left arm radiculopathy  . Pancreas cyst   . Pancreatitis    chronic pancreatitis  . TIA (transient ischemic attack)   . Tobacco use     Patient Active Problem List   Diagnosis Date Noted  . Acute pancreatitis 07/01/2015    Past Surgical History:  Procedure Laterality Date  . ESOPHAGEAL ATRESIA REPAIR      Prior to Admission medications   Medication Sig Start Date End Date Taking? Authorizing Provider  cephALEXin (KEFLEX) 500 MG capsule Take 1 capsule (500 mg total) by mouth 3 (three) times daily. 03/26/17   Tommi Rumps, PA-C  cetirizine (ZYRTEC) 10 MG tablet Take 10 mg by mouth daily. 04/27/15   [provider]  CREON 24000 units CPEP Take 24,000 Units by mouth 3 (three) times daily with meals. 06/22/15   [provider]  enalapril (VASOTEC) 20 MG tablet Take 20 mg by mouth daily. 06/22/15   [provider]  gabapentin (NEURONTIN) 300 MG capsule Take 300 mg by mouth 2 (two) times daily.    [provider]  NEXIUM 40 MG capsule Take 40 mg by mouth daily before breakfast. 06/22/15   [provider]  PREZCOBIX 800-150 MG tablet Take 1 tablet by mouth daily.    [provider]  TIVICAY 50 MG tablet Take 50 mg by mouth daily. 06/22/15   [provider]  triamcinolone cream (KENALOG) 0.5 % Apply 1 application topically 2 (two) times daily. 06/22/15   [provider]  VENTOLIN HFA 108 (90 Base) MCG/ACT inhaler Inhale 2 puffs into the lungs 4 (four) times daily as needed. For shortness of breath and/or wheezing. 06/22/15   [provider]    Allergies Sulfur  Family History  Problem Relation Age of Onset  . Heart failure Father   . Emphysema Father     Social History Social History  Substance Use Topics  . Smoking status: Current Every Day Smoker    Packs/day: 0.50    Types: Cigarettes  . Smokeless tobacco: Never Used  . Alcohol use 0.0 oz/week    Review of Systems Constitutional: No fever/chills Cardiovascular: Denies chest pain. Respiratory: Denies shortness of breath. Musculoskeletal: Negative for left knee pain. Skin: Positive for laceration left knee. Neurological: Negative for  focal weakness or numbness. Allergic/Immunilogical: HIV positive.  ____________________________________________   PHYSICAL EXAM:  VITAL SIGNS: ED Triage Vitals [03/26/17 0801]  Enc Vitals Group  BP 120/78     Pulse Rate 64     Resp 14     Temp 98.2 F (36.8 C)     Temp Source Oral     SpO2 98 %     Weight 131 lb (59.4 kg)     Height  (1.626 m)     Head Circumference      Peak Flow      Pain Score      Pain Loc      Pain Edu?      Excl. in GC?     Constitutional: Alert and oriented. Well appearing and in no acute distress. Eyes: Conjunctivae are normal. PERRL. EOMI. Head: Atraumatic. Neck: No stridor.   Cardiovascular: Normal rate, regular rhythm. Grossly normal heart sounds.  Good peripheral  circulation. Respiratory: Normal respiratory effort.  No retractions. Lungs CTAB. Musculoskeletal: Moves left lower extremity without any difficulty. Noted soft tissue swelling present. No joint effusion present. Neurologic:  Normal speech and language. No gross focal neurologic deficits are appreciated. No gait instability. Skin:  Skin is warm, dry.  There is a 1.5 cm superficial laceration to the left knee anterior aspect without active bleeding. Area is more medially located. No drainage present. No evidence of foreign body. Psychiatric: Mood and affect are normal. Speech and behavior are normal.  ____________________________________________   LABS (all labs ordered are listed, but only abnormal results are displayed)  Labs Reviewed - No data to display   PROCEDURES  Procedure(s) performed: None  Procedures  Critical Care performed: No  ____________________________________________   INITIAL IMPRESSION / ASSESSMENT AND PLAN / ED COURSE  Pertinent labs & imaging results that were available during my care of the patient were reviewed by me and considered in my medical decision making (see chart for details).  Area was cleaned with normal saline. No foreign body was noted. Patient was given a prescription for prophylactically for Keflex 500 mg 3 times a day for infection prevention. Patient was given instructions to clean daily with mild soap and water and watch for any signs of infection. He is to follow-up with his PCP at [facility] if any continued problems.   ___________________________________________   FINAL CLINICAL IMPRESSION(S) / ED DIAGNOSES  Final diagnoses:  Laceration of skin of left knee, initial encounter      NEW MEDICATIONS STARTED DURING THIS VISIT:  Discharge Medication List as of 03/26/2017  9:17 AM    START taking these medications   Details  cephALEXin (KEFLEX) 500 MG capsule Take 1 capsule (500 mg total) by mouth 3 (three) times daily., Starting Wed  03/26/2017, Print         Note:  This document was prepared using Dragon voice recognition software and may include unintentional dictation errors.    Tommi Rumps, PA-C 03/26/17 1031    Sharman Cheek, MD 03/27/17 [phone]

## 2017-03-26 NOTE — ED Notes (Signed)
Wound cleaned with NS, dressing applied

## 2017-03-26 NOTE — ED Notes (Signed)
RN at bedside to discharge pt. Pt and family both gone.  Attempted to call patient as he did not receive his abx prescription.  Will leave papers at front desk for pt if returns for prescription.  He reports will come back but RN unsure when he will return. Placed at first nurse desk.  Pt was unable to sign or update VS as he was gone.

## 2017-04-16 ENCOUNTER — Ambulatory Visit: Admission: RE | Admit: 2017-04-16 | Discharge: 2017-04-16 | Payer: MEDICARE

## 2017-04-16 DIAGNOSIS — H52203 Unspecified astigmatism, bilateral: Secondary | ICD-10-CM

## 2017-04-16 DIAGNOSIS — H31002 Unspecified chorioretinal scars, left eye: Secondary | ICD-10-CM

## 2017-04-16 DIAGNOSIS — H5213 Myopia, bilateral: Secondary | ICD-10-CM

## 2017-04-16 DIAGNOSIS — B20 Human immunodeficiency virus [HIV] disease: Principal | ICD-10-CM

## 2017-04-22 ENCOUNTER — Emergency Department
Admission: EM | Admit: 2017-04-22 | Discharge: 2017-04-22 | Disposition: A | Discharge status: Home / Self Care | Payer: Medicare Other | Attending: Emergency Medicine | Admitting: Emergency Medicine

## 2017-04-22 ENCOUNTER — Encounter: Payer: Self-pay | Admitting: *Deleted

## 2017-04-22 DIAGNOSIS — Z8673 Personal history of transient ischemic attack (TIA), and cerebral infarction without residual deficits: Secondary | ICD-10-CM | POA: Diagnosis not present

## 2017-04-22 DIAGNOSIS — Z79899 Other long term (current) drug therapy: Secondary | ICD-10-CM | POA: Diagnosis not present

## 2017-04-22 DIAGNOSIS — M545 Low back pain: Secondary | ICD-10-CM | POA: Insufficient documentation

## 2017-04-22 DIAGNOSIS — F17210 Nicotine dependence, cigarettes, uncomplicated: Secondary | ICD-10-CM | POA: Diagnosis not present

## 2017-04-22 DIAGNOSIS — J449 Chronic obstructive pulmonary disease, unspecified: Secondary | ICD-10-CM | POA: Insufficient documentation

## 2017-04-22 DIAGNOSIS — I10 Essential (primary) hypertension: Secondary | ICD-10-CM | POA: Diagnosis not present

## 2017-04-22 DIAGNOSIS — B20 Human immunodeficiency virus [HIV] disease: Secondary | ICD-10-CM | POA: Insufficient documentation

## 2017-04-22 DIAGNOSIS — G8929 Other chronic pain: Secondary | ICD-10-CM

## 2017-04-22 MED ORDER — TRAMADOL HCL 50 MG PO TABS: 50.0000 mg | ORAL_TABLET | Freq: Four times a day (QID) | ORAL | 0 refills | Status: DC | PRN

## 2017-04-22 MED ORDER — CYCLOBENZAPRINE HCL 10 MG PO TABS: 10.0000 mg | ORAL_TABLET | Freq: Three times a day (TID) | ORAL | 0 refills | Status: DC | PRN

## 2017-04-22 MED ORDER — MELOXICAM 15 MG PO TABS
15.0000 mg | ORAL_TABLET | Freq: Every day | ORAL | 0 refills | Status: DC
Start: 2017-04-22 — End: 2018-10-19

## 2017-04-22 NOTE — ED Provider Notes (Signed)
[facility] Emergency Department Provider Note   ____________________________________________   First MD Initiated Contact with Patient 04/22/17 1025     (approximate)  I have reviewed the triage vital signs and the nursing notes.   HISTORY  Chief Complaint Back Pain    HPI Mason Martin is a 51 y.o. male patient complain chronic back pain for approximately 10 months which has increased in the past month. Patient states seen multiple doctors with no relief except when he had physical therapy 5 months ago. Patient denies radicular component to his back pain. Patient denies bladder or bowel dysfunction. Patient rates his pain as a 10 over 10. Patient describes pain as "achy".   Past Medical History:  Diagnosis Date  . 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 COPD (chronic obstructive pulmonary disease) (HCC)   . GERD (gastroesophageal reflux disease)   . History of meningitis   . HIV disease (HCC)    on HAART, [facility]  . Hypertension   . Neck pain    with left arm radiculopathy  . Pancreas cyst   . Pancreatitis    chronic pancreatitis  . TIA (transient ischemic attack)   . Tobacco use     Patient Active Problem List   Diagnosis Date Noted  . Acute pancreatitis 07/01/2015    Past Surgical History:  Procedure Laterality Date  . ESOPHAGEAL ATRESIA REPAIR      Prior to Admission medications   Medication Sig Start Date End Date Taking? Authorizing Provider  cephALEXin (KEFLEX) 500 MG capsule Take 1 capsule (500 mg total) by mouth 3 (three) times daily. 03/26/17   Tommi RumpsSummers, Rhonda L, PA-C  cetirizine (ZYRTEC) 10 MG tablet Take 10 mg by mouth daily. 04/27/15   [provider]  CREON 24000 units CPEP Take 24,000 Units by mouth 3 (three) times daily with meals. 06/22/15   [provider]  cyclobenzaprine (FLEXERIL) 10 MG tablet Take 1 tablet (10 mg total) by mouth 3 (three) times daily as needed.  04/22/17   Joni ReiningSmith, Rosalia Mcavoy K, PA-C  enalapril (VASOTEC) 20 MG tablet Take 20 mg by mouth daily. 06/22/15   [provider]  gabapentin (NEURONTIN) 300 MG capsule Take 300 mg by mouth 2 (two) times daily.    [provider]  meloxicam (MOBIC) 15 MG tablet Take 1 tablet (15 mg total) by mouth daily. 04/22/17   Joni ReiningSmith, Eian Vandervelden K, PA-C  NEXIUM 40 MG capsule Take 40 mg by mouth daily before breakfast. 06/22/15   [provider]  PREZCOBIX 800-150 MG tablet Take 1 tablet by mouth daily.    [provider]  TIVICAY 50 MG tablet Take 50 mg by mouth daily. 06/22/15   [provider]  traMADol (ULTRAM) 50 MG tablet Take 1 tablet (50 mg total) by mouth every 6 (six) hours as needed for moderate pain. 04/22/17   Joni ReiningSmith, Altheria Shadoan K, PA-C  triamcinolone cream (KENALOG) 0.5 % Apply 1 application topically 2 (two) times daily. 06/22/15   [provider]  VENTOLIN HFA 108 (90 Base) MCG/ACT inhaler Inhale 2 puffs into the lungs 4 (four) times daily as needed. For shortness of breath and/or wheezing. 06/22/15   [provider]    Allergies Sulfur  Family History  Problem Relation Age of Onset  . Heart failure Father   . Emphysema Father     Social History Social History  Substance Use Topics  . Smoking status: Current  Every Day Smoker    Packs/day: 0.50    Types: Cigarettes  . Smokeless tobacco: Never Used  . Alcohol use 0.0 oz/week    Review of Systems  Constitutional: No fever/chills Eyes: No visual changes. ENT: No sore throat. Cardiovascular: Denies chest pain. Respiratory: Denies shortness of breath. Gastrointestinal: No abdominal pain.  No nausea, no vomiting.  No diarrhea.  No constipation. Genitourinary: Negative for dysuria. Musculoskeletal: Negative for back pain. Skin: Negative for rash. Neurological: Negative for headaches, focal weakness or numbness. Endocrine:Hypertension and pancreatitis : Allergic/Immunilogical:  HIV  ____________________________________________   PHYSICAL EXAM:  VITAL SIGNS: ED Triage Vitals  Enc Vitals Group     BP 04/22/17 0929 128/76     Pulse Rate 04/22/17 0929 67     Resp 04/22/17 0929 20     Temp 04/22/17 0929 98.4 F (36.9 C)     Temp Source 04/22/17 0929 Oral     SpO2 04/22/17 0929 99 %     Weight 04/22/17 0927 131 lb (59.4 kg)     Height 04/22/17 0927 5\' 4"  (1.626 m)     Head Circumference --      Peak Flow --      Pain Score 04/22/17 0926 9     Pain Loc --      Pain Edu? --      Excl. in GC? --    Constitutional: Alert and oriented. Well appearing and in no acute distress.  Oropharynx non-erythematous. Neck: No stridor.  No cervical spine tenderness to palpation. Cardiovascular: Normal rate, regular rhythm. Grossly normal heart sounds.  Good peripheral circulation. Respiratory: Normal respiratory effort.  No retractions. Lungs CTAB. Musculoskeletal: No obvious spinal deformity. Patient moderate guarding palpation L3-S1. Patient has full nuchal range of motion of the back to grimace of pain. Patient has negative straight leg test.  Neurologic:  Normal speech and language. No gross focal neurologic deficits are appreciated. No gait instability. Skin:  Skin is warm, dry and intact. No rash noted. Psychiatric: Mood and affect are normal. Speech and behavior are normal.  ____________________________________________   LABS (all labs ordered are listed, but only abnormal results are displayed)  Labs Reviewed - No data to display ____________________________________________  EKG   ____________________________________________  RADIOLOGY  No results found.  _View x-rays taken in February 2018 showing moderate degenerative changes. ___________________________________________   PROCEDURES  Procedure(s) performed: None  Procedures  Critical Care performed: No  ____________________________________________   INITIAL IMPRESSION / ASSESSMENT AND  PLAN / ED COURSE  As part of my medical decision making, I reviewed the following data within the electronic MEDICAL RECORD NUMBER    Chronic low back pain secondary to arthritis. Patient given discharge care instructions. Patient advised to follow with orthopedic department for definitive evaluation and treatment.      ____________________________________________   FINAL CLINICAL IMPRESSION(S) / ED DIAGNOSES  Final diagnoses:  Chronic bilateral low back pain without sciatica      NEW MEDICATIONS STARTED DURING THIS VISIT:  New Prescriptions   CYCLOBENZAPRINE (FLEXERIL) 10 MG TABLET    Take 1 tablet (10 mg total) by mouth 3 (three) times daily as needed.   MELOXICAM (MOBIC) 15 MG TABLET    Take 1 tablet (15 mg total) by mouth daily.   TRAMADOL (ULTRAM) 50 MG TABLET    Take 1 tablet (50 mg total) by mouth every 6 (six) hours as needed for moderate pain.     Note:  This document was prepared using Conservation officer, historic buildings  and may include unintentional dictation errors.    Joni Reining, PA-C 04/22/17 1039    Emily Filbert, MD 04/22/17 [phone]

## 2017-04-22 NOTE — ED Triage Notes (Signed)
Pt complains of back pain for the last month, pt reports seeing multiple doctors with no relief from his lumbar pain, pt denies any other symptoms

## 2017-04-22 NOTE — ED Notes (Signed)
AAOx3.  Skin warm and dry.  Ambulates with easy and steady gait. NAD 

## 2017-04-29 ENCOUNTER — Other Ambulatory Visit: Payer: Self-pay | Admitting: Orthopedic Surgery

## 2017-04-29 DIAGNOSIS — M5440 Lumbago with sciatica, unspecified side: Principal | ICD-10-CM

## 2017-04-29 DIAGNOSIS — M5136 Other intervertebral disc degeneration, lumbar region: Secondary | ICD-10-CM

## 2017-04-29 DIAGNOSIS — M5137 Other intervertebral disc degeneration, lumbosacral region: Secondary | ICD-10-CM

## 2017-04-29 DIAGNOSIS — M545 Low back pain, unspecified: Secondary | ICD-10-CM

## 2017-05-08 ENCOUNTER — Ambulatory Visit
Admission: RE | Admit: 2017-05-08 | Discharge: 2017-05-08 | Disposition: A | Discharge status: Home / Self Care | Payer: Medicare Other | Source: Ambulatory Visit | Attending: Orthopedic Surgery | Admitting: Orthopedic Surgery

## 2017-05-08 DIAGNOSIS — M5136 Other intervertebral disc degeneration, lumbar region: Secondary | ICD-10-CM | POA: Insufficient documentation

## 2017-05-08 DIAGNOSIS — M5137 Other intervertebral disc degeneration, lumbosacral region: Secondary | ICD-10-CM

## 2017-05-08 DIAGNOSIS — M5440 Lumbago with sciatica, unspecified side: Secondary | ICD-10-CM | POA: Diagnosis present

## 2017-05-08 DIAGNOSIS — M545 Low back pain, unspecified: Secondary | ICD-10-CM

## 2017-05-08 DIAGNOSIS — M48061 Spinal stenosis, lumbar region without neurogenic claudication: Secondary | ICD-10-CM | POA: Insufficient documentation

## 2017-05-27 ENCOUNTER — Other Ambulatory Visit: Payer: Self-pay

## 2017-05-27 ENCOUNTER — Inpatient Hospital Stay
Admission: EM | Admit: 2017-05-27 | Discharge: 2017-05-28 | DRG: 440 | Disposition: A | Discharge status: Home / Self Care | Payer: Medicare Other | Attending: Internal Medicine | Admitting: Internal Medicine

## 2017-05-27 DIAGNOSIS — Z21 Asymptomatic human immunodeficiency virus [HIV] infection status: Secondary | ICD-10-CM | POA: Diagnosis not present

## 2017-05-27 DIAGNOSIS — Z8661 Personal history of infections of the central nervous system: Secondary | ICD-10-CM

## 2017-05-27 DIAGNOSIS — E876 Hypokalemia: Secondary | ICD-10-CM | POA: Diagnosis not present

## 2017-05-27 DIAGNOSIS — K860 Alcohol-induced chronic pancreatitis: Secondary | ICD-10-CM | POA: Diagnosis present

## 2017-05-27 DIAGNOSIS — Z8673 Personal history of transient ischemic attack (TIA), and cerebral infarction without residual deficits: Secondary | ICD-10-CM | POA: Diagnosis not present

## 2017-05-27 DIAGNOSIS — K852 Alcohol induced acute pancreatitis without necrosis or infection: Principal | ICD-10-CM | POA: Diagnosis present

## 2017-05-27 DIAGNOSIS — F17210 Nicotine dependence, cigarettes, uncomplicated: Secondary | ICD-10-CM | POA: Diagnosis not present

## 2017-05-27 DIAGNOSIS — J449 Chronic obstructive pulmonary disease, unspecified: Secondary | ICD-10-CM | POA: Diagnosis not present

## 2017-05-27 DIAGNOSIS — Z791 Long term (current) use of non-steroidal anti-inflammatories (NSAID): Secondary | ICD-10-CM | POA: Diagnosis not present

## 2017-05-27 DIAGNOSIS — K219 Gastro-esophageal reflux disease without esophagitis: Secondary | ICD-10-CM | POA: Diagnosis present

## 2017-05-27 DIAGNOSIS — I10 Essential (primary) hypertension: Secondary | ICD-10-CM | POA: Diagnosis not present

## 2017-05-27 DIAGNOSIS — B20 Human immunodeficiency virus [HIV] disease: Secondary | ICD-10-CM | POA: Diagnosis present

## 2017-05-27 DIAGNOSIS — K859 Acute pancreatitis without necrosis or infection, unspecified: Secondary | ICD-10-CM | POA: Diagnosis present

## 2017-05-27 DIAGNOSIS — Z882 Allergy status to sulfonamides status: Secondary | ICD-10-CM | POA: Diagnosis not present

## 2017-05-27 DIAGNOSIS — Z79899 Other long term (current) drug therapy: Secondary | ICD-10-CM

## 2017-05-27 DIAGNOSIS — Z86718 Personal history of other venous thrombosis and embolism: Secondary | ICD-10-CM

## 2017-05-27 LAB — COMPREHENSIVE METABOLIC PANEL
ALK PHOS: 96 U/L (ref 38–126)
ALT: 11 U/L — AB (ref 17–63)
AST: 20 U/L (ref 15–41)
Albumin: 3.8 g/dL (ref 3.5–5.0)
Anion gap: 16 — ABNORMAL HIGH (ref 5–15)
BUN: 6 mg/dL (ref 6–20)
CALCIUM: 8.5 mg/dL — AB (ref 8.9–10.3)
CO2: 25 mmol/L (ref 22–32)
CREATININE: 0.68 mg/dL (ref 0.61–1.24)
Chloride: 98 mmol/L — ABNORMAL LOW (ref 101–111)
Glucose, Bld: 107 mg/dL — ABNORMAL HIGH (ref 65–99)
Potassium: 2.7 mmol/L — CL (ref 3.5–5.1)
SODIUM: 139 mmol/L (ref 135–145)
Total Bilirubin: 0.8 mg/dL (ref 0.3–1.2)
Total Protein: 7.3 g/dL (ref 6.5–8.1)

## 2017-05-27 LAB — CBC
HCT: 38.8 % — ABNORMAL LOW (ref 40.0–52.0)
Hemoglobin: 12.9 g/dL — ABNORMAL LOW (ref 13.0–18.0)
MCH: 29.2 pg (ref 26.0–34.0)
MCHC: 33.2 g/dL (ref 32.0–36.0)
MCV: 87.9 fL (ref 80.0–100.0)
PLATELETS: 330 10*3/uL (ref 150–440)
RBC: 4.42 MIL/uL (ref 4.40–5.90)
RDW: 16.1 % — ABNORMAL HIGH (ref 11.5–14.5)
WBC: 7.5 10*3/uL (ref 3.8–10.6)

## 2017-05-27 LAB — LIPASE, BLOOD: Lipase: 139 U/L — ABNORMAL HIGH (ref 11–51)

## 2017-05-27 LAB — MAGNESIUM: MAGNESIUM: 1.2 mg/dL — AB (ref 1.7–2.4)

## 2017-05-27 MED ORDER — SODIUM CHLORIDE 0.9 % IV SOLN
INTRAVENOUS | Status: DC
  Administered 2017-05-28: via INTRAVENOUS

## 2017-05-27 MED ORDER — DARUNAVIR-COBICISTAT 800-150 MG PO TABS
1.0000 | ORAL_TABLET | Freq: Every day | ORAL | Status: DC
  Filled 2017-05-27: qty 1

## 2017-05-27 MED ORDER — ONDANSETRON HCL 4 MG/2ML IJ SOLN: 4.0000 mg | Freq: Four times a day (QID) | INTRAMUSCULAR | Status: DC | PRN

## 2017-05-27 MED ORDER — SODIUM CHLORIDE 0.9 % IV BOLUS (SEPSIS)
1000.0000 mL | Freq: Once | INTRAVENOUS | Status: AC
  Administered 2017-05-27: 1000 mL via INTRAVENOUS

## 2017-05-27 MED ORDER — OXYCODONE HCL 5 MG PO TABS
5.0000 mg | ORAL_TABLET | ORAL | Status: DC | PRN
  Administered 2017-05-28 (×2): 5 mg via ORAL
  Filled 2017-05-27 (×2): qty 1

## 2017-05-27 MED ORDER — DOLUTEGRAVIR SODIUM 50 MG PO TABS
50.0000 mg | ORAL_TABLET | Freq: Every day | ORAL | Status: DC
  Filled 2017-05-27: qty 1

## 2017-05-27 MED ORDER — SENNOSIDES-DOCUSATE SODIUM 8.6-50 MG PO TABS
1.0000 | ORAL_TABLET | Freq: Two times a day (BID) | ORAL | Status: DC | PRN
  Administered 2017-05-28: 1 via ORAL
  Filled 2017-05-27: qty 1

## 2017-05-27 MED ORDER — ACETAMINOPHEN 650 MG RE SUPP: 650.0000 mg | Freq: Four times a day (QID) | RECTAL | Status: DC | PRN

## 2017-05-27 MED ORDER — ALBUTEROL SULFATE (2.5 MG/3ML) 0.083% IN NEBU
3.0000 mL | INHALATION_SOLUTION | Freq: Four times a day (QID) | RESPIRATORY_TRACT | Status: DC | PRN
  Administered 2017-05-28: 3 mL via RESPIRATORY_TRACT
  Filled 2017-05-27: qty 3

## 2017-05-27 MED ORDER — PANTOPRAZOLE SODIUM 40 MG PO TBEC: 40.0000 mg | DELAYED_RELEASE_TABLET | Freq: Every day | ORAL | Status: DC

## 2017-05-27 MED ORDER — HYDROMORPHONE HCL 1 MG/ML IJ SOLN
INTRAMUSCULAR | Status: AC
  Administered 2017-05-27: 1 mg via INTRAVENOUS
  Filled 2017-05-27: qty 1

## 2017-05-27 MED ORDER — POTASSIUM CHLORIDE 10 MEQ/100ML IV SOLN
10.0000 meq | INTRAVENOUS | Status: DC
  Administered 2017-05-28 (×3): 10 meq via INTRAVENOUS
  Filled 2017-05-27 (×7): qty 100

## 2017-05-27 MED ORDER — ACETAMINOPHEN 325 MG PO TABS: 650.0000 mg | ORAL_TABLET | Freq: Four times a day (QID) | ORAL | Status: DC | PRN

## 2017-05-27 MED ORDER — ENOXAPARIN SODIUM 40 MG/0.4ML ~~LOC~~ SOLN: 40.0000 mg | SUBCUTANEOUS | Status: DC

## 2017-05-27 MED ORDER — HYDROMORPHONE HCL 1 MG/ML IJ SOLN
1.0000 mg | Freq: Once | INTRAMUSCULAR | Status: AC
  Administered 2017-05-27: 1 mg via INTRAVENOUS

## 2017-05-27 MED ORDER — MORPHINE SULFATE (PF) 4 MG/ML IV SOLN: 4.0000 mg | INTRAVENOUS | Status: DC | PRN

## 2017-05-27 MED ORDER — ENALAPRIL MALEATE 20 MG PO TABS
20.0000 mg | ORAL_TABLET | Freq: Every day | ORAL | Status: DC
  Filled 2017-05-27: qty 1

## 2017-05-27 MED ORDER — ONDANSETRON HCL 4 MG PO TABS: 4.0000 mg | ORAL_TABLET | Freq: Four times a day (QID) | ORAL | Status: DC | PRN

## 2017-05-27 NOTE — H&P (Signed)
[facility] [place] [facility]   PATIENT NAME: Mason Martin    MR#:  [id number]  DATE OF BIRTH:  10/13/1965  DATE OF ADMISSION:  05/27/2017  PRIMARY CARE PHYSICIAN: System, Provider Not In   REQUESTING/REFERRING PHYSICIAN: Scotty CourtStafford, MD  CHIEF COMPLAINT:   Chief Complaint  Patient presents with  . URI  . Abdominal Pain  . Emesis    HISTORY OF PRESENT ILLNESS:  Mason Martin  is a 51 y.o. male who presents with abdominal pain.  Patient has a history of pancreatitis, and his lipase is elevated on evaluation here in the ED again today.  Hospitalist were called for admission for recurrent pancreatitis  PAST MEDICAL HISTORY:   Past Medical History:  Diagnosis Date  . 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 COPD (chronic obstructive pulmonary disease) (HCC)   . GERD (gastroesophageal reflux disease)   . History of meningitis   . HIV disease (HCC)    on HAART, [facility]  . Hypertension   . Neck pain    with left arm radiculopathy  . Pancreas cyst   . Pancreatitis    chronic pancreatitis  . TIA (transient ischemic attack)   . Tobacco use     PAST SURGICAL HISTORY:   Past Surgical History:  Procedure Laterality Date  . ESOPHAGEAL ATRESIA REPAIR      SOCIAL HISTORY:   Social History   Tobacco Use  . Smoking status: Current Every Day Smoker    Packs/day: 0.50    Types: Cigarettes  . Smokeless tobacco: Never Used  Substance Use Topics  . Alcohol use: Yes    Alcohol/week: 0.0 oz    FAMILY HISTORY:   Family History  Problem Relation Age of Onset  . Heart failure Father   . Emphysema Father     DRUG ALLERGIES:   Allergies  Allergen Reactions  . Sulfur Hives    MEDICATIONS AT HOME:   Prior to Admission medications   Medication Sig Start Date End Date Taking? Authorizing Provider  cetirizine (ZYRTEC) 10 MG tablet Take 10 mg by mouth daily. 04/27/15  Yes [provider]  CREON 24000  units CPEP Take 24,000 Units by mouth 3 (three) times daily with meals. 06/22/15  Yes [provider]  cyclobenzaprine (FLEXERIL) 10 MG tablet Take 1 tablet (10 mg total) by mouth 3 (three) times daily as needed. 04/22/17  Yes Joni ReiningSmith, Ronald K, PA-C  enalapril (VASOTEC) 20 MG tablet Take 20 mg by mouth daily. 06/22/15  Yes [provider]  gabapentin (NEURONTIN) 300 MG capsule Take 300 mg by mouth 2 (two) times daily.   Yes [provider]  meloxicam (MOBIC) 15 MG tablet Take 1 tablet (15 mg total) by mouth daily. 04/22/17  Yes Joni ReiningSmith, Ronald K, PA-C  NEXIUM 40 MG capsule Take 40 mg by mouth daily before breakfast. 06/22/15  Yes [provider]  PREZCOBIX 800-150 MG tablet Take 1 tablet by mouth daily.   Yes [provider]  TIVICAY 50 MG tablet Take 50 mg by mouth daily. 06/22/15  Yes [provider]  traMADol (ULTRAM) 50 MG tablet Take 1 tablet (50 mg total) by mouth every 6 (six) hours as needed for moderate pain. 04/22/17  Yes Joni ReiningSmith, Ronald K, PA-C  triamcinolone cream (KENALOG) 0.5 % Apply 1 application topically 2 (two) times daily. 06/22/15  Yes [provider]  VENTOLIN HFA 108 (90 Base) MCG/ACT inhaler Inhale 2  puffs into the lungs 4 (four) times daily as needed. For shortness of breath and/or wheezing. 06/22/15  Yes [provider]  cephALEXin (KEFLEX) 500 MG capsule Take 1 capsule (500 mg total) by mouth 3 (three) times daily. Patient not taking: Reported on 05/27/2017 03/26/17   Tommi Rumps, PA-C    REVIEW OF SYSTEMS:  Review of Systems  Constitutional: Negative for chills, fever, malaise/fatigue and weight loss.  HENT: Negative for ear pain, hearing loss and tinnitus.   Eyes: Negative for blurred vision, double vision, pain and redness.  Respiratory: Negative for cough, hemoptysis and shortness of breath.   Cardiovascular: Negative for chest pain, palpitations, orthopnea and leg swelling.  Gastrointestinal:  Positive for abdominal pain, nausea and vomiting. Negative for constipation and diarrhea.  Genitourinary: Negative for dysuria, frequency and hematuria.  Musculoskeletal: Negative for back pain, joint pain and neck pain.  Skin:       No acne, rash, or lesions  Neurological: Negative for dizziness, tremors, focal weakness and weakness.  Endo/Heme/Allergies: Negative for polydipsia. Does not bruise/bleed easily.  Psychiatric/Behavioral: Negative for depression. The patient is not nervous/anxious and does not have insomnia.      VITAL SIGNS:   Vitals:   05/27/17 1820 05/27/17 2134  BP: (!) 190/98 (!) 161/94  Pulse: 75 84  Resp: 18 20  Temp: 98.2 F (36.8 C)   TempSrc: Oral   SpO2: 98% 96%  Weight: 61.2 kg (135 lb)   Height: 5\' 4"  (1.626 m)    Wt Readings from Last 3 Encounters:  05/27/17 61.2 kg (135 lb)  04/22/17 59.4 kg (131 lb)  03/26/17 59.4 kg (131 lb)    PHYSICAL EXAMINATION:  Physical Exam  Vitals reviewed. Constitutional: He is oriented to person, place, and time. He appears well-developed and well-nourished. No distress.  HENT:  Head: Normocephalic and atraumatic.  Mouth/Throat: Oropharynx is clear and moist.  Eyes: Conjunctivae and EOM are normal. Pupils are equal, round, and reactive to light. No scleral icterus.  Neck: Normal range of motion. Neck supple. No JVD present. No thyromegaly present.  Cardiovascular: Normal rate, regular rhythm and intact distal pulses. Exam reveals no gallop and no friction rub.  No murmur heard. Respiratory: Effort normal and breath sounds normal. No respiratory distress. He has no wheezes. He has no rales.  GI: Soft. Bowel sounds are normal. He exhibits no distension. There is tenderness.  Musculoskeletal: Normal range of motion. He exhibits no edema.  No arthritis, no gout  Lymphadenopathy:    He has no cervical adenopathy.  Neurological: He is alert and oriented to person, place, and time. No cranial nerve deficit.  No  dysarthria, no aphasia  Skin: Skin is warm and dry. No rash noted. No erythema.  Psychiatric: He has a normal mood and affect. His behavior is normal. Judgment and thought content normal.    LABORATORY PANEL:   CBC Recent Labs  Lab 05/27/17 1822  WBC 7.5  HGB 12.9*  HCT 38.8*  PLT 330   ------------------------------------------------------------------------------------------------------------------  Chemistries  Recent Labs  Lab 05/27/17 1822  NA 139  K 2.7*  CL 98*  CO2 25  GLUCOSE 107*  BUN 6  CREATININE 0.68  CALCIUM 8.5*  AST 20  ALT 11*  ALKPHOS 96  BILITOT 0.8   ------------------------------------------------------------------------------------------------------------------  Cardiac Enzymes No results for input(s): TROPONINI in the last 168 hours. ------------------------------------------------------------------------------------------------------------------  RADIOLOGY:  No results found.  EKG:   Orders placed or performed during the hospital encounter of 10/23/16  .  EKG 12-Lead  . EKG 12-Lead  . EKG 12-Lead  . EKG 12-Lead  . EKG 12-Lead  . EKG 12-Lead    IMPRESSION AND PLAN:  Principal Problem:   Acute recurrent pancreatitis -n.p.o., PRN analgesics and antiemetics, IV fluids, restart Creon at home dose once he is taking p.o. Active Problems:   HTN (hypertension) -continue home meds   COPD (chronic obstructive pulmonary disease) (HCC) -home dose as needed inhaler   Hypokalemia -replace potassium, check magnesium, monitor   GERD (gastroesophageal reflux disease) -home dose PPI   HIV (human immunodeficiency virus infection) (HCC) -Home dose antiretrovirals  All the records are reviewed and case discussed with ED provider. Management plans discussed with the patient and/or family.  DVT PROPHYLAXIS: SubQ lovenox  GI PROPHYLAXIS: PPI  ADMISSION STATUS: Inpatient  CODE STATUS: Full Code Status History    Date Active Date Inactive Code  Status Order ID Comments User Context   03/18/2017 16:13 03/20/2017 12:27 Full Code [id number]  Altamese DillingVachhani, Vaibhavkumar, MD Inpatient   07/01/2015 20:05 07/05/2015 17:38 Full Code [id number]  Enid BaasKalisetti, Radhika, MD Inpatient      TOTAL TIME TAKING CARE OF THIS PATIENT: 45 minutes.   Minela [place] FIELDING 05/27/2017, 10:19 PM  [facility]  Office  [phone]  CC: Primary care physician; System, Provider Not In  Note:  This document was prepared using Dragon voice recognition software and may include unintentional dictation errors.

## 2017-05-27 NOTE — ED Triage Notes (Signed)
Pt to ER via POV c/o URI sx X 4 days. Abdominal pain and vomiting that started yesterday. Hx of pancreatitis.

## 2017-05-27 NOTE — ED Provider Notes (Signed)
[facility] Emergency Department Provider Note  ____________________________________________  Time seen: Approximately 10:04 PM  I have reviewed the triage vital signs and the nursing notes.   HISTORY  Chief Complaint URI; Abdominal Pain; and Emesis    HPI Mason Martin is a 51 y.o. male reports having cough cold congestion and body aches for the past 4 days. 2 days ago he drank about 4 beers, and then yesterday he started having upper abdominal pain with vomiting and inability to eat or drink. 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 Denies fever or chills. No trauma. No bloody emesis or bloody bowel movements. No abdominal swelling. Currently no aggravating or alleviating factors for his pain which is moderate intensity and sharp.     Past Medical History:  Diagnosis Date  . 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 COPD (chronic obstructive pulmonary disease) (HCC)   . GERD (gastroesophageal reflux disease)   . History of meningitis   . HIV disease (HCC)    on HAART, [facility]  . Hypertension   . Neck pain    with left arm radiculopathy  . Pancreas cyst   . Pancreatitis    chronic pancreatitis  . TIA (transient ischemic attack)   . Tobacco use      Patient Active Problem List   Diagnosis Date Noted  . Acute pancreatitis 07/01/2015     Past Surgical History:  Procedure Laterality Date  . ESOPHAGEAL ATRESIA REPAIR       Prior to Admission medications   Medication Sig Start Date End Date Taking? Authorizing Provider  cetirizine (ZYRTEC) 10 MG tablet Take 10 mg by mouth daily. 04/27/15  Yes [provider]  CREON 24000 units CPEP Take 24,000 Units by mouth 3 (three) times daily with meals. 06/22/15  Yes [provider]  cyclobenzaprine (FLEXERIL) 10 MG tablet Take 1 tablet (10 mg total) by mouth 3 (three) times daily as needed. 04/22/17  Yes Joni ReiningSmith, Ronald K,  PA-C  enalapril (VASOTEC) 20 MG tablet Take 20 mg by mouth daily. 06/22/15  Yes [provider]  gabapentin (NEURONTIN) 300 MG capsule Take 300 mg by mouth 2 (two) times daily.   Yes [provider]  meloxicam (MOBIC) 15 MG tablet Take 1 tablet (15 mg total) by mouth daily. 04/22/17  Yes Joni ReiningSmith, Ronald K, PA-C  NEXIUM 40 MG capsule Take 40 mg by mouth daily before breakfast. 06/22/15  Yes [provider]  PREZCOBIX 800-150 MG tablet Take 1 tablet by mouth daily.   Yes [provider]  TIVICAY 50 MG tablet Take 50 mg by mouth daily. 06/22/15  Yes [provider]  traMADol (ULTRAM) 50 MG tablet Take 1 tablet (50 mg total) by mouth every 6 (six) hours as needed for moderate pain. 04/22/17  Yes Joni ReiningSmith, Ronald K, PA-C  triamcinolone cream (KENALOG) 0.5 % Apply 1 application topically 2 (two) times daily. 06/22/15  Yes [provider]  VENTOLIN HFA 108 (90 Base) MCG/ACT inhaler Inhale 2 puffs into the lungs 4 (four) times daily as needed. For shortness of breath and/or wheezing. 06/22/15  Yes [provider]  cephALEXin (KEFLEX) 500 MG capsule Take 1 capsule (500 mg total) by mouth 3 (three) times daily. Patient not taking: Reported on 05/27/2017 03/26/17   Tommi RumpsSummers, Rhonda L, PA-C     Allergies Sulfur   Family History  Problem Relation Age of Onset  . Heart  failure Father   . Emphysema Father     Social History Social History   Tobacco Use  . Smoking status: Current Every Day Smoker    Packs/day: 0.50    Types: Cigarettes  . Smokeless tobacco: Never Used  Substance Use Topics  . Alcohol use: Yes    Alcohol/week: 0.0 oz  . Drug use: No    Review of Systems  Constitutional:   No fever or chills.  ENT:   Positive sore throat. No rhinorrhea. Cardiovascular:   No chest pain or syncope. Respiratory:   No dyspnea , positive nonproductive cough. Gastrointestinal:  positive as above for abdominal pain and vomiting.   Musculoskeletal:   Negative for focal pain or swelling All other systems reviewed and are negative except as documented above in ROS and HPI.  ____________________________________________   PHYSICAL EXAM:  VITAL SIGNS: ED Triage Vitals  Enc Vitals Group     BP 05/27/17 1820 (!) 190/98     Pulse Rate 05/27/17 1820 75     Resp 05/27/17 1820 18     Temp 05/27/17 1820 98.2 F (36.8 C)     Temp Source 05/27/17 1820 Oral     SpO2 05/27/17 1820 98 %     Weight 05/27/17 1820 135 lb (61.2 kg)     Height 05/27/17 1820 5\' 4"  (1.626 m)     Head Circumference --      Peak Flow --      Pain Score 05/27/17 1819 9     Pain Loc --      Pain Edu? --      Excl. in GC? --     Vital signs reviewed, nursing assessments reviewed.   Constitutional:   Alert and oriented. Well appearing and in no distress. Eyes:   No scleral icterus.  EOMI. No nystagmus. No conjunctival pallor. PERRL. ENT   Head:   Normocephalic and atraumatic.   Nose:   No congestion/rhinnorhea.    Mouth/Throat:   MMM, no pharyngeal erythema. No peritonsillar mass.    Neck:   No meningismus. Full ROM. Hematological/Lymphatic/Immunilogical:   No cervical lymphadenopathy. Cardiovascular:   RRR. Symmetric bilateral radial and DP pulses.  No murmurs.  Respiratory:   Normal respiratory effort without tachypnea/retractions. Breath sounds are clear and equal bilaterally. No wheezes/rales/rhonchi. Gastrointestinal:   Soft with significant epigastric tenderness. Non distended. There is no CVA tenderness.  No rebound, rigidity, or guarding. Genitourinary:   deferred Musculoskeletal:   Normal range of motion in all extremities. No joint effusions.  No lower extremity tenderness.  No edema. Neurologic:   Normal speech and language.  Motor grossly intact. No gross focal neurologic deficits are appreciated.  Skin:    Skin is warm, dry and intact. No rash noted.  No petechiae, purpura, or  bullae.  ____________________________________________    LABS (pertinent positives/negatives) (all labs ordered are listed, but only abnormal results are displayed) Labs Reviewed  LIPASE, BLOOD - Abnormal; Notable for the following components:      Result Value   Lipase 139 (*)    All other components within normal limits  COMPREHENSIVE METABOLIC PANEL - Abnormal; Notable for the following components:   Potassium 2.7 (*)    Chloride 98 (*)    Glucose, Bld 107 (*)    Calcium 8.5 (*)    ALT 11 (*)    Anion gap 16 (*)    All other components within normal limits  CBC - Abnormal; Notable for the following components:  Hemoglobin 12.9 (*)    HCT 38.8 (*)    RDW 16.1 (*)    All other components within normal limits  URINALYSIS, COMPLETE (UACMP) WITH MICROSCOPIC   ____________________________________________   EKG    ____________________________________________    RADIOLOGY  No results found.  ____________________________________________   PROCEDURES Procedures  ____________________________________________     CLINICAL IMPRESSION / ASSESSMENT AND PLAN / ED COURSE  Pertinent labs & imaging results that were available during my care of the patient were reviewed by me and considered in my medical decision making (see chart for details).   patient presents with upper abdominal pain and tenderness, elevated lipase consistent with alcohol-induced pancreatitis as he has had in the past. Afebrile, normal white blood cell count, low suspicion for hemorrhagic or infected pseudocyst. he has significant symptoms, hypokalemia, will require bowel rest, IV fluids and symptom control in the hospital. Case discussed with the hospitalists for further management. Counseled patient again on standing from alcohol. His other symptoms appear to be due to a viral respiratory illness, relatively inconsequential at this time.     ____________________________________________   FINAL  CLINICAL IMPRESSION(S) / ED DIAGNOSES    Final diagnoses:  Hypokalemia  Alcohol-induced acute pancreatitis, unspecified complication status      This SmartLink is deprecated. Use AVSMEDLIST instead to display the medication list for a patient.   Portions of this note were generated with dragon dictation software. Dictation errors may occur despite best attempts at proofreading.    Sharman CheekStafford, Anuhea Gassner, MD 05/27/17 2206

## 2017-05-27 NOTE — ED Notes (Signed)
Called pharmacy to send up pt's potassium. Waiting for them them verify.

## 2017-05-28 DIAGNOSIS — K852 Alcohol induced acute pancreatitis without necrosis or infection: Secondary | ICD-10-CM | POA: Diagnosis not present

## 2017-05-28 LAB — COMPREHENSIVE METABOLIC PANEL
ALBUMIN: 3.1 g/dL — AB (ref 3.5–5.0)
ALT: 10 U/L — ABNORMAL LOW (ref 17–63)
ANION GAP: 9 (ref 5–15)
AST: 16 U/L (ref 15–41)
Alkaline Phosphatase: 87 U/L (ref 38–126)
BUN: 6 mg/dL (ref 6–20)
CALCIUM: 8.1 mg/dL — AB (ref 8.9–10.3)
CHLORIDE: 103 mmol/L (ref 101–111)
CO2: 25 mmol/L (ref 22–32)
Creatinine, Ser: 0.61 mg/dL (ref 0.61–1.24)
GFR calc non Af Amer: >60 mL/min (ref >60)
GLUCOSE: 107 mg/dL — AB (ref 65–99)
POTASSIUM: 3.2 mmol/L — AB (ref 3.5–5.1)
SODIUM: 137 mmol/L (ref 135–145)
Total Bilirubin: 0.8 mg/dL (ref 0.3–1.2)
Total Protein: 6.1 g/dL — ABNORMAL LOW (ref 6.5–8.1)

## 2017-05-28 LAB — URINALYSIS, COMPLETE (UACMP) WITH MICROSCOPIC
BACTERIA UA: NONE SEEN
Bilirubin Urine: NEGATIVE
Glucose, UA: NEGATIVE mg/dL
Hgb urine dipstick: NEGATIVE
Ketones, ur: NEGATIVE mg/dL
Leukocytes, UA: NEGATIVE
Nitrite: NEGATIVE
PROTEIN: 30 mg/dL — AB
SPECIFIC GRAVITY, URINE: 1.013 (ref 1.005–1.030)
pH: 7 (ref 5.0–8.0)

## 2017-05-28 LAB — CBC
HEMATOCRIT: 34.2 % — AB (ref 40.0–52.0)
HEMOGLOBIN: 11.3 g/dL — AB (ref 13.0–18.0)
MCH: 29.2 pg (ref 26.0–34.0)
MCHC: 32.9 g/dL (ref 32.0–36.0)
MCV: 88.8 fL (ref 80.0–100.0)
Platelets: 307 10*3/uL (ref 150–440)
RBC: 3.85 MIL/uL — AB (ref 4.40–5.90)
RDW: 16 % — ABNORMAL HIGH (ref 11.5–14.5)
WBC: 5.3 10*3/uL (ref 3.8–10.6)

## 2017-05-28 LAB — MAGNESIUM: Magnesium: 1.5 mg/dL — ABNORMAL LOW (ref 1.7–2.4)

## 2017-05-28 LAB — LIPASE, BLOOD: LIPASE: 36 U/L (ref 11–51)

## 2017-05-28 MED ORDER — POTASSIUM CHLORIDE IN NACL 20-0.9 MEQ/L-% IV SOLN
INTRAVENOUS | Status: DC
  Administered 2017-05-28: 04:00:00 via INTRAVENOUS
  Filled 2017-05-28 (×3): qty 1000

## 2017-05-28 MED ORDER — MAGNESIUM SULFATE 2 GM/50ML IV SOLN: 2.0000 g | Freq: Once | INTRAVENOUS | Status: DC

## 2017-05-28 MED ORDER — POTASSIUM CHLORIDE CRYS ER 20 MEQ PO TBCR
40.0000 meq | EXTENDED_RELEASE_TABLET | Freq: Once | ORAL | Status: AC
  Administered 2017-05-28: 40 meq via ORAL
  Filled 2017-05-28: qty 2

## 2017-05-28 MED ORDER — MAGNESIUM SULFATE 4 GM/100ML IV SOLN
4.0000 g | Freq: Once | INTRAVENOUS | Status: AC
  Administered 2017-05-28: 4 g via INTRAVENOUS
  Filled 2017-05-28: qty 100

## 2017-05-28 MED ORDER — GUAIFENESIN 100 MG/5ML PO SOLN
5.0000 mL | ORAL | Status: DC | PRN
  Administered 2017-05-28: 100 mg via ORAL
  Filled 2017-05-28 (×3): qty 5

## 2017-05-28 NOTE — Progress Notes (Signed)
Mason Martin to be D/C'd Home per MD order.  Discussed prescriptions and follow up appointments with the patient. Prescriptions given to patient, medication list explained in detail. Pt verbalized understanding.  Allergies as of 05/28/2017      Reactions   Sulfur Hives      Medication List    TAKE these medications   cephALEXin 500 MG capsule Commonly known as:  KEFLEX Take 1 capsule (500 mg total) by mouth 3 (three) times daily.   cetirizine 10 MG tablet Commonly known as:  ZYRTEC Take 10 mg by mouth daily.   CREON 24000-76000 units Cpep Generic drug:  Pancrelipase (Lip-Prot-Amyl) Take 24,000 Units by mouth 3 (three) times daily with meals.   cyclobenzaprine 10 MG tablet Commonly known as:  FLEXERIL Take 1 tablet (10 mg total) by mouth 3 (three) times daily as needed.   enalapril 20 MG tablet Commonly known as:  VASOTEC Take 20 mg by mouth daily.   gabapentin 300 MG capsule Commonly known as:  NEURONTIN Take 300 mg by mouth 2 (two) times daily.   meloxicam 15 MG tablet Commonly known as:  MOBIC Take 1 tablet (15 mg total) by mouth daily.   NEXIUM 40 MG capsule Generic drug:  esomeprazole Take 40 mg by mouth daily before breakfast.   PREZCOBIX 800-150 MG tablet Generic drug:  darunavir-cobicistat Take 1 tablet by mouth daily.   TIVICAY 50 MG tablet Generic drug:  dolutegravir Take 50 mg by mouth daily.   traMADol 50 MG tablet Commonly known as:  ULTRAM Take 1 tablet (50 mg total) by mouth every 6 (six) hours as needed for moderate pain.   triamcinolone cream 0.5 % Commonly known as:  KENALOG Apply 1 application topically 2 (two) times daily.   VENTOLIN HFA 108 (90 Base) MCG/ACT inhaler Generic drug:  albuterol Inhale 2 puffs into the lungs 4 (four) times daily as needed. For shortness of breath and/or wheezing.       Vitals:   05/28/17 0424 05/28/17 0646  BP: (!) 156/88   Pulse: 66   Resp: 20   Temp: 97.6 F (36.4 C)   SpO2: 98% 98%    Skin  clean, dry and intact without evidence of skin break down, no evidence of skin tears noted. IV catheter discontinued intact. Site without signs and symptoms of complications. Dressing and pressure applied. Pt denies pain at this time. No complaints noted.  An After Visit Summary was printed and given to the patient. [name] home via private auto.  Mason Martin

## 2017-05-29 NOTE — Discharge Summary (Signed)
[employer] - [place] at [facility]   PATIENT NAME: Mason Martin    MR#:  [id number]  DATE OF BIRTH:  [birth year]  DATE OF ADMISSION:  05/27/2017   ADMITTING PHYSICIAN: Oralia Manis, MD  DATE OF DISCHARGE: 05/28/2017 10:56 AM  PRIMARY CARE PHYSICIAN: System, Provider Not In   ADMISSION DIAGNOSIS:  Hypokalemia [E87.6] Alcohol-induced acute pancreatitis, unspecified complication status [K85.20] DISCHARGE DIAGNOSIS:  Principal Problem:   Acute recurrent pancreatitis Active Problems:   HTN (hypertension)   GERD (gastroesophageal reflux disease)   COPD (chronic obstructive pulmonary disease) (HCC)   HIV (human immunodeficiency virus infection) (HCC)   Hypokalemia  SECONDARY DIAGNOSIS:   Past Medical History:  Diagnosis Date  . 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 COPD (chronic obstructive pulmonary disease) (HCC)   . GERD (gastroesophageal reflux disease)   . History of meningitis   . HIV disease (HCC)    on HAART, follows with ID at [facility]  . Hypertension   . Neck pain    with left arm radiculopathy  . Pancreas cyst   . Pancreatitis    chronic pancreatitis  . TIA (transient ischemic attack)   . Tobacco use    HOSPITAL COURSE:   51 y.o. male who presents with abdominal pain.  Patient has a history of pancreatitis, and his lipase was elevated on admission upto 139. In the morning when I saw he was already ready to walk out, wanting to eat and pain free. We rechecked his Lipase and was 36.  Had severe hypokalemia which was repleted and his Magnesium was also low so was repleted. Was offered to give some more K & Mg but he wouldn't want to stay anymore.  He was D/Ced in stable condition. DISCHARGE CONDITIONS:  stable CONSULTS OBTAINED:   DRUG ALLERGIES:   Allergies  Allergen Reactions  . Sulfur Hives   DISCHARGE MEDICATIONS:   Allergies as of 05/28/2017      Reactions   Sulfur Hives      Medication List    TAKE these  medications   cephALEXin 500 MG capsule Commonly known as:  KEFLEX Take 1 capsule (500 mg total) by mouth 3 (three) times daily.   cetirizine 10 MG tablet Commonly known as:  ZYRTEC Take 10 mg by mouth daily.   CREON 24000-76000 units Cpep Generic drug:  Pancrelipase (Lip-Prot-Amyl) Take 24,000 Units by mouth 3 (three) times daily with meals.   cyclobenzaprine 10 MG tablet Commonly known as:  FLEXERIL Take 1 tablet (10 mg total) by mouth 3 (three) times daily as needed.   enalapril 20 MG tablet Commonly known as:  VASOTEC Take 20 mg by mouth daily.   gabapentin 300 MG capsule Commonly known as:  NEURONTIN Take 300 mg by mouth 2 (two) times daily.   meloxicam 15 MG tablet Commonly known as:  MOBIC Take 1 tablet (15 mg total) by mouth daily.   NEXIUM 40 MG capsule Generic drug:  esomeprazole Take 40 mg by mouth daily before breakfast.   PREZCOBIX 800-150 MG tablet Generic drug:  darunavir-cobicistat Take 1 tablet by mouth daily.   TIVICAY 50 MG tablet Generic drug:  dolutegravir Take 50 mg by mouth daily.   traMADol 50 MG tablet Commonly known as:  ULTRAM Take 1 tablet (50 mg total) by mouth every 6 (six) hours as needed for moderate pain.   triamcinolone cream 0.5 % Commonly known as:  KENALOG Apply 1 application topically 2 (two) times  daily.   VENTOLIN HFA 108 (90 Base) MCG/ACT inhaler Generic drug:  albuterol Inhale 2 puffs into the lungs 4 (four) times daily as needed. For shortness of breath and/or wheezing.        DISCHARGE INSTRUCTIONS:   DIET:  Regular diet DISCHARGE CONDITION:  Good ACTIVITY:  Activity as tolerated OXYGEN:  Home Oxygen: No.  Oxygen Delivery: room air DISCHARGE LOCATION:  home   If you experience worsening of your admission symptoms, develop shortness of breath, life threatening emergency, suicidal or homicidal thoughts you must seek medical attention immediately by calling 911 or calling your MD immediately  if symptoms  less severe.  You Must read complete instructions/literature along with all the possible adverse reactions/side effects for all the Medicines you take and that have been prescribed to you. Take any new Medicines after you have completely understood and accpet all the possible adverse reactions/side effects.   Please note  You were cared for by a hospitalist during your hospital stay. If you have any questions about your discharge medications or the care you received while you were in the hospital after you are discharged, you can call the unit and asked to speak with the hospitalist on call if the hospitalist that took care of you is not available. Once you are discharged, your primary care physician will handle any further medical issues. Please note that NO REFILLS for any discharge medications will be authorized once you are discharged, as it is imperative that you return to your primary care physician (or establish a relationship with a primary care physician if you do not have one) for your aftercare needs so that they can reassess your need for medications and monitor your lab values.    On the day of Discharge:  VITAL SIGNS:  Blood pressure (!) 156/88, pulse 66, temperature 97.6 F (36.4 C), temperature source Oral, resp. rate 20, height 5\' 4"  (1.626 m), weight 59.5 kg (131 lb 1.6 oz), SpO2 98 %. PHYSICAL EXAMINATION:  GENERAL:  51 y.o.-year-old patient lying in the bed with no acute distress.  EYES: Pupils equal, round, reactive to light and accommodation. No scleral icterus. Extraocular muscles intact.  HEENT: Head atraumatic, normocephalic. Oropharynx and nasopharynx clear.  NECK:  Supple, no jugular venous distention. No thyroid enlargement, no tenderness.  LUNGS: Normal breath sounds bilaterally, no wheezing, rales,rhonchi or crepitation. No use of accessory muscles of respiration.  CARDIOVASCULAR: S1, S2 normal. No murmurs, rubs, or gallops.  ABDOMEN: Soft, non-tender,  non-distended. Bowel sounds present. No organomegaly or mass.  EXTREMITIES: No pedal edema, cyanosis, or clubbing.  NEUROLOGIC: Cranial nerves II through XII are intact. Muscle strength 5/5 in all extremities. Sensation intact. Gait not checked.  PSYCHIATRIC: The patient is alert and oriented x 3.  SKIN: No obvious rash, lesion, or ulcer.  DATA REVIEW:   CBC Recent Labs  Lab 05/28/17 0401  WBC 5.3  HGB 11.3*  HCT 34.2*  PLT 307    Chemistries  Recent Labs  Lab 05/28/17 0401  NA 137  K 3.2*  CL 103  CO2 25  GLUCOSE 107*  BUN 6  CREATININE 0.61  CALCIUM 8.1*  MG 1.5*  AST 16  ALT 10*  ALKPHOS 87  BILITOT 0.8     Follow-up Information    Please follow up.   Why:  Please schedule a hospital follow-up with your PCP          Management plans discussed with the patient, family and they are in agreement.  CODE STATUS: Prior   TOTAL TIME TAKING CARE OF THIS PATIENT: 45 minutes.    Delfino LovettVipul Eudell Julian M.D on 05/29/2017 at 6:50 PM  Between 7am to 6pm - Pager - [phone]  After 6pm go to [email or web address] - Social research officer, governmentpassword [email or web address]  [facility] [place]  Office  [phone]  CC: Primary care physician; System, Provider Not In   Note: This dictation was prepared with Dragon dictation along with smaller phrase technology. Any transcriptional errors that result from this process are unintentional.

## 2017-06-02 ENCOUNTER — Emergency Department
Admission: EM | Admit: 2017-06-02 | Discharge: 2017-06-02 | Disposition: A | Discharge status: Home / Self Care | Payer: MEDICARE | Source: Intra-hospital

## 2017-06-02 ENCOUNTER — Emergency Department
Admission: EM | Admit: 2017-06-02 | Discharge: 2017-06-02 | Disposition: A | Discharge status: Home / Self Care | Payer: MEDICARE | Source: Intra-hospital | Attending: Emergency Medicine | Admitting: Emergency Medicine

## 2017-06-02 MED ORDER — ONDANSETRON 4 MG DISINTEGRATING TABLET
ORAL_TABLET | Freq: Three times a day (TID) | ORAL | 0 refills | 0.00000 days | Status: CP | PRN
Start: 2017-06-02 — End: 2017-06-09

## 2017-06-02 MED ORDER — MORPHINE 30 MG IMMEDIATE RELEASE TABLET
ORAL_TABLET | ORAL | 0 refills | 0.00000 days | Status: CP | PRN
Start: 2017-06-02 — End: 2017-06-07

## 2017-06-16 ENCOUNTER — Ambulatory Visit: Admission: RE | Admit: 2017-06-16 | Discharge: 2017-06-16 | Payer: MEDICARE | Admitting: Internal Medicine

## 2017-06-16 DIAGNOSIS — I10 Essential (primary) hypertension: Secondary | ICD-10-CM

## 2017-06-16 DIAGNOSIS — M5431 Sciatica, right side: Principal | ICD-10-CM

## 2017-06-16 DIAGNOSIS — B20 Human immunodeficiency virus [HIV] disease: Secondary | ICD-10-CM

## 2017-06-16 MED ORDER — AMLODIPINE 5 MG TABLET: 5 mg | tablet | Freq: Every day | 11 refills | 0 days | Status: AC

## 2017-06-16 MED ORDER — AMLODIPINE 5 MG TABLET
ORAL_TABLET | Freq: Every day | ORAL | 11 refills | 0.00000 days | Status: CP
Start: 2017-06-16 — End: 2017-06-20

## 2017-06-20 ENCOUNTER — Ambulatory Visit: Admission: RE | Admit: 2017-06-20 | Discharge: 2017-06-20 | Disposition: A | Discharge status: Home / Self Care

## 2017-06-20 DIAGNOSIS — K860 Alcohol-induced chronic pancreatitis: Secondary | ICD-10-CM

## 2017-06-20 DIAGNOSIS — D649 Anemia, unspecified: Secondary | ICD-10-CM

## 2017-06-20 DIAGNOSIS — K2270 Barrett's esophagus without dysplasia: Principal | ICD-10-CM

## 2017-06-26 ENCOUNTER — Encounter: Payer: Self-pay | Admitting: Emergency Medicine

## 2017-06-26 ENCOUNTER — Emergency Department
Admission: EM | Admit: 2017-06-26 | Discharge: 2017-06-26 | Disposition: A | Discharge status: Home / Self Care | Payer: Medicare Other | Attending: Emergency Medicine | Admitting: Emergency Medicine

## 2017-06-26 DIAGNOSIS — I10 Essential (primary) hypertension: Secondary | ICD-10-CM | POA: Diagnosis not present

## 2017-06-26 DIAGNOSIS — R1013 Epigastric pain: Secondary | ICD-10-CM | POA: Diagnosis present

## 2017-06-26 DIAGNOSIS — F17210 Nicotine dependence, cigarettes, uncomplicated: Secondary | ICD-10-CM | POA: Diagnosis not present

## 2017-06-26 DIAGNOSIS — B20 Human immunodeficiency virus [HIV] disease: Secondary | ICD-10-CM | POA: Insufficient documentation

## 2017-06-26 DIAGNOSIS — J449 Chronic obstructive pulmonary disease, unspecified: Secondary | ICD-10-CM | POA: Diagnosis not present

## 2017-06-26 DIAGNOSIS — Z79899 Other long term (current) drug therapy: Secondary | ICD-10-CM | POA: Insufficient documentation

## 2017-06-26 DIAGNOSIS — Z8673 Personal history of transient ischemic attack (TIA), and cerebral infarction without residual deficits: Secondary | ICD-10-CM | POA: Insufficient documentation

## 2017-06-26 LAB — URINALYSIS, COMPLETE (UACMP) WITH MICROSCOPIC
BACTERIA UA: NONE SEEN
BILIRUBIN URINE: NEGATIVE
Glucose, UA: NEGATIVE mg/dL
HGB URINE DIPSTICK: NEGATIVE
KETONES UR: NEGATIVE mg/dL
Leukocytes, UA: NEGATIVE
NITRITE: NEGATIVE
PROTEIN: NEGATIVE mg/dL
Specific Gravity, Urine: 1.011 (ref 1.005–1.030)
Squamous Epithelial / LPF: NONE SEEN
pH: 8 (ref 5.0–8.0)

## 2017-06-26 LAB — COMPREHENSIVE METABOLIC PANEL
ALBUMIN: 4 g/dL (ref 3.5–5.0)
ALK PHOS: 89 U/L (ref 38–126)
ALT: 12 U/L — ABNORMAL LOW (ref 17–63)
ANION GAP: 8 (ref 5–15)
AST: 25 U/L (ref 15–41)
BILIRUBIN TOTAL: 0.5 mg/dL (ref 0.3–1.2)
BUN: 14 mg/dL (ref 6–20)
CALCIUM: 8.3 mg/dL — AB (ref 8.9–10.3)
CO2: 24 mmol/L (ref 22–32)
Chloride: 109 mmol/L (ref 101–111)
Creatinine, Ser: 0.85 mg/dL (ref 0.61–1.24)
GFR calc non Af Amer: >60 mL/min (ref >60)
Glucose, Bld: 106 mg/dL — ABNORMAL HIGH (ref 65–99)
POTASSIUM: 3 mmol/L — AB (ref 3.5–5.1)
SODIUM: 141 mmol/L (ref 135–145)
TOTAL PROTEIN: 7 g/dL (ref 6.5–8.1)

## 2017-06-26 LAB — CBC
HEMATOCRIT: 37.6 % — AB (ref 40.0–52.0)
HEMOGLOBIN: 12.4 g/dL — AB (ref 13.0–18.0)
MCH: 27.8 pg (ref 26.0–34.0)
MCHC: 32.9 g/dL (ref 32.0–36.0)
MCV: 84.4 fL (ref 80.0–100.0)
Platelets: 322 10*3/uL (ref 150–440)
RBC: 4.46 MIL/uL (ref 4.40–5.90)
RDW: 17.3 % — ABNORMAL HIGH (ref 11.5–14.5)
WBC: 6 10*3/uL (ref 3.8–10.6)

## 2017-06-26 LAB — LIPASE, BLOOD: Lipase: 47 U/L (ref 11–51)

## 2017-06-26 MED ORDER — MORPHINE SULFATE (PF) 4 MG/ML IV SOLN
4.0000 mg | Freq: Once | INTRAVENOUS | Status: AC
  Administered 2017-06-26: 4 mg via INTRAVENOUS
  Filled 2017-06-26: qty 1

## 2017-06-26 MED ORDER — SODIUM CHLORIDE 0.9 % IV SOLN
1000.0000 mL | Freq: Once | INTRAVENOUS | Status: AC
  Administered 2017-06-26: 1000 mL via INTRAVENOUS

## 2017-06-26 MED ORDER — ONDANSETRON HCL 4 MG/2ML IJ SOLN
4.0000 mg | Freq: Once | INTRAMUSCULAR | Status: AC
  Administered 2017-06-26: 4 mg via INTRAVENOUS
  Filled 2017-06-26: qty 2

## 2017-06-26 MED ORDER — TRAMADOL HCL 50 MG PO TABS: 50.0000 mg | ORAL_TABLET | Freq: Four times a day (QID) | ORAL | 0 refills | Status: AC | PRN

## 2017-06-26 MED ORDER — FERROUS SULFATE 325 MG (65 MG IRON) TABLET,DELAYED RELEASE
ORAL_TABLET | Freq: Three times a day (TID) | ORAL | 11 refills | 0.00000 days | Status: CP
Start: 2017-06-26 — End: 2017-06-26

## 2017-06-26 MED ORDER — FERROUS SULFATE 325 MG (65 MG IRON) TABLET,DELAYED RELEASE: 325 mg | tablet | Freq: Every day | 11 refills | 0 days | Status: AC

## 2017-06-26 NOTE — ED Provider Notes (Signed)
[facility] Emergency Department Provider Note   ____________________________________________    I have reviewed the triage vital signs and the nursing notes.   HISTORY  Chief Complaint Abdominal Pain     HPI Mason Martin is a 51 y.o. male who presents with complaints of epigastric pain.  Patient has a history of chronic pancreatitis, follows with [facility].  Recently saw them.  Arranged for pancreatic MRI for nodule in the pancreas.  Pain today appears to be acute on chronic epigastric pain.  Has not taken anything for it.  Worse with eating.  Started this morning.  Has not had any alcohol for nearly 1 month nor drugs.   Past Medical History:  Diagnosis Date  . 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 COPD (chronic obstructive pulmonary disease) (HCC)   . GERD (gastroesophageal reflux disease)   . History of meningitis   . HIV disease (HCC)    on HAART, [facility]  . Hypertension   . Neck pain    with left arm radiculopathy  . Pancreas cyst   . Pancreatitis    chronic pancreatitis  . TIA (transient ischemic attack)   . Tobacco use     Patient Active Problem List   Diagnosis Date Noted  . HTN (hypertension) 05/27/2017  . GERD (gastroesophageal reflux disease) 05/27/2017  . COPD (chronic obstructive pulmonary disease) (HCC) 05/27/2017  . HIV (human immunodeficiency virus infection) (HCC) 05/27/2017  . Hypokalemia 05/27/2017  . Acute recurrent pancreatitis 07/01/2015    Past Surgical History:  Procedure Laterality Date  . ESOPHAGEAL ATRESIA REPAIR      Prior to Admission medications   Medication Sig Start Date End Date Taking? Authorizing Provider  cephALEXin (KEFLEX) 500 MG capsule Take 1 capsule (500 mg total) by mouth 3 (three) times daily. Patient not taking: Reported on 05/27/2017 03/26/17   Tommi RumpsSummers, Rhonda L, PA-C  cetirizine (ZYRTEC) 10 MG tablet Take 10 mg by mouth daily. 04/27/15   [provider]  CREON 24000 units CPEP Take 24,000 Units by mouth 3 (three) times daily with meals. 06/22/15   [provider]  cyclobenzaprine (FLEXERIL) 10 MG tablet Take 1 tablet (10 mg total) by mouth 3 (three) times daily as needed. 04/22/17   Joni ReiningSmith, Ronald K, PA-C  enalapril (VASOTEC) 20 MG tablet Take 20 mg by mouth daily. 06/22/15   [provider]  gabapentin (NEURONTIN) 300 MG capsule Take 300 mg by mouth 2 (two) times daily.    [provider]  meloxicam (MOBIC) 15 MG tablet Take 1 tablet (15 mg total) by mouth daily. 04/22/17   Joni ReiningSmith, Ronald K, PA-C  NEXIUM 40 MG capsule Take 40 mg by mouth daily before breakfast. 06/22/15   [provider]  PREZCOBIX 800-150 MG tablet Take 1 tablet by mouth daily.    [provider]  TIVICAY 50 MG tablet Take 50 mg by mouth daily. 06/22/15   [provider]  traMADol (ULTRAM) 50 MG tablet Take 1 tablet (50 mg total) by mouth every 6 (six) hours as needed. 06/26/17 06/26/18  Jene EveryKinner, Lyrik Dockstader, MD  triamcinolone cream (KENALOG) 0.5 % Apply 1 application topically 2 (two) times daily. 06/22/15   [provider]  VENTOLIN HFA 108 (90 Base) MCG/ACT inhaler Inhale 2 puffs into the lungs 4 (four) times daily as needed. For shortness of breath and/or wheezing. 06/22/15   [provider]     Allergies  Sulfur  Family History  Problem Relation Age of Onset  . Heart failure Father   . Emphysema Father     Social History Social History   Tobacco Use  . Smoking status: Current Every Day Smoker    Packs/day: 0.25    Types: Cigarettes  . Smokeless tobacco: Never Used  Substance Use Topics  . Alcohol use: Yes    Alcohol/week: 0.0 oz  . Drug use: No    Review of Systems  Constitutional: No fever/chills Eyes: No visual changes.  ENT: No sore throat. Cardiovascular: Denies chest pain. Respiratory: Denies shortness of breath. Gastrointestinal: As above Genitourinary: Negative  for dysuria. Musculoskeletal: Negative for back pain. Skin: Negative for rash. Neurological: Negative for headaches    ____________________________________________   PHYSICAL EXAM:  VITAL SIGNS: ED Triage Vitals  Enc Vitals Group     BP 06/26/17 1708 (!) 145/101     Pulse Rate 06/26/17 1708 91     Resp 06/26/17 1708 16     Temp 06/26/17 1708 98.6 F (37 C)     Temp Source 06/26/17 1708 Oral     SpO2 06/26/17 1708 98 %     Weight 06/26/17 1709 63.5 kg (140 lb)     Height 06/26/17 1709 1.626 m (5\' 4" )     Head Circumference --      Peak Flow --      Pain Score 06/26/17 1708 9     Pain Loc --      Pain Edu? --      Excl. in GC? --     Constitutional: Alert and oriented. No acute distress. Pleasant and interactive Eyes: Conjunctivae are normal.   Nose: No congestion/rhinnorhea. Mouth/Throat: Mucous membranes are moist.    Cardiovascular: Normal rate, regular rhythm. Grossly normal heart sounds.  Good peripheral circulation. Respiratory: Normal respiratory effort.  No retractions. Lungs CTAB. Gastrointestinal: Mild epigastric tenderness palpation.  No distention.  No CVA tenderness. Genitourinary: deferred Musculoskeletal:  Warm and well perfused Neurologic:  Normal speech and language. No gross focal neurologic deficits are appreciated.  Skin:  Skin is warm, dry and intact. No rash noted. Psychiatric: Mood and affect are normal. Speech and behavior are normal.  ____________________________________________   LABS (all labs ordered are listed, but only abnormal results are displayed)  Labs Reviewed  COMPREHENSIVE METABOLIC PANEL - Abnormal; Notable for the following components:      Result Value   Potassium 3.0 (*)    Glucose, Bld 106 (*)    Calcium 8.3 (*)    ALT 12 (*)    All other components within normal limits  CBC - Abnormal; Notable for the following components:   Hemoglobin 12.4 (*)    HCT 37.6 (*)    RDW 17.3 (*)    All other components within normal  limits  URINALYSIS, COMPLETE (UACMP) WITH MICROSCOPIC - Abnormal; Notable for the following components:   Color, Urine YELLOW (*)    APPearance CLEAR (*)    All other components within normal limits  LIPASE, BLOOD   ____________________________________________  EKG  None ____________________________________________  RADIOLOGY  None ____________________________________________   PROCEDURES  Procedure(s) performed: No  Procedures   Critical Care performed: No ____________________________________________   INITIAL IMPRESSION / ASSESSMENT AND PLAN / ED COURSE  Pertinent labs & imaging results that were available during my care of the patient were reviewed by me and considered in my medical decision making (see chart for details).  Patient presents with epigastric pain.  Differential diagnosis  includes acute on chronic pancreatitis, gastritis, PUD.  Has outpatient MRI scheduled.  Reviewed medical records of [facility] they note continued pancreatic pain despite alcohol cessation.  Lipase is normal today but it appears have been normal for most of his pancreatic flares in the past.  Will treat with IV pain medications and Zofran and reevaluate.  ----------------------------------------- 8:43 PM on 06/26/2017 -----------------------------------------  Significant improvement after IV analgesics, he states he is ready to go home.  Will provide analgesic Rx.  Return precautions discussed    ____________________________________________   FINAL CLINICAL IMPRESSION(S) / ED DIAGNOSES  Final diagnoses:  Epigastric pain        Note:  This document was prepared using Dragon voice recognition software and may include unintentional dictation errors.    Jene Every, MD 06/26/17 2043

## 2017-06-26 NOTE — ED Triage Notes (Signed)
Pt in via POV with complaints of generalized abdominal pain since this morning, reports some nausea, denies any emesis/diarrhea.  Pt ambulatory to triage.  NAD noted at this time.

## 2017-06-26 NOTE — ED Notes (Signed)
Pt has abd pain.  No n/v/d.  Pt reports hx pancreatitis.   Last etoh drink was 1 month ago.  Pt states sharp pain this morning with nausea.  Pt also states it hurts to stand up straight to walk because of the pain.  Pt alert.

## 2017-06-30 ENCOUNTER — Ambulatory Visit: Admission: RE | Admit: 2017-06-30 | Discharge: 2017-06-30 | Payer: MEDICARE

## 2017-07-03 ENCOUNTER — Ambulatory Visit: Admit: 2017-07-03 | Discharge: 2017-07-04 | Payer: MEDICARE

## 2017-07-03 DIAGNOSIS — K860 Alcohol-induced chronic pancreatitis: Principal | ICD-10-CM

## 2017-07-07 ENCOUNTER — Ambulatory Visit
Admit: 2017-07-07 | Discharge: 2017-07-08 | Payer: MEDICARE | Attending: Infectious Disease | Primary: Infectious Disease

## 2017-07-07 DIAGNOSIS — B20 Human immunodeficiency virus [HIV] disease: Secondary | ICD-10-CM

## 2017-07-07 DIAGNOSIS — K863 Pseudocyst of pancreas: Secondary | ICD-10-CM

## 2017-07-07 DIAGNOSIS — F1010 Alcohol abuse, uncomplicated: Secondary | ICD-10-CM

## 2017-07-07 DIAGNOSIS — I10 Essential (primary) hypertension: Principal | ICD-10-CM

## 2017-07-07 MED ORDER — SUCRALFATE 1 GRAM TABLET
ORAL_TABLET | Freq: Four times a day (QID) | ORAL | 3 refills | 0.00000 days | Status: CP
Start: 2017-07-07 — End: 2017-08-25

## 2017-07-07 MED ORDER — SUCRALFATE 1 GRAM TABLET: 1 g | tablet | Freq: Four times a day (QID) | 3 refills | 0 days | Status: AC

## 2017-07-07 MED ORDER — OXYCODONE-ACETAMINOPHEN 5 MG-325 MG TABLET
ORAL_TABLET | Freq: Every evening | ORAL | 0 refills | 0.00000 days | Status: CP | PRN
Start: 2017-07-07 — End: 2017-08-25

## 2017-07-09 ENCOUNTER — Ambulatory Visit
Admit: 2017-07-09 | Discharge: 2017-07-10 | Disposition: A | Discharge status: Home / Self Care | Payer: MEDICARE | Attending: Emergency Medicine

## 2017-07-09 MED ORDER — ESOMEPRAZOLE MAGNESIUM 40 MG CAPSULE,DELAYED RELEASE
ORAL_CAPSULE | Freq: Every day | ORAL | 0 refills | 0 days | Status: CP
Start: 2017-07-09 — End: 2017-07-19

## 2017-07-14 ENCOUNTER — Encounter: Admit: 2017-07-14 | Discharge: 2017-07-14 | Payer: MEDICARE

## 2017-07-14 ENCOUNTER — Ambulatory Visit: Admit: 2017-07-14 | Discharge: 2017-07-14 | Payer: MEDICARE

## 2017-07-14 DIAGNOSIS — R1013 Epigastric pain: Principal | ICD-10-CM

## 2017-07-15 ENCOUNTER — Ambulatory Visit: Admit: 2017-07-15 | Discharge: 2017-07-16 | Payer: MEDICARE

## 2017-07-15 DIAGNOSIS — L859 Epidermal thickening, unspecified: Principal | ICD-10-CM

## 2017-07-15 DIAGNOSIS — R21 Rash and other nonspecific skin eruption: Secondary | ICD-10-CM

## 2017-07-15 MED ORDER — UREA 20 % TOPICAL CREAM
TOPICAL | 6 refills | 0.00000 days | Status: CP
Start: 2017-07-15 — End: 2017-07-15

## 2017-07-15 MED ORDER — CLOBETASOL 0.05 % TOPICAL OINTMENT: g | 4 refills | 0 days | Status: AC

## 2017-07-15 MED ORDER — UREA 20 % TOPICAL CREAM: g | 6 refills | 0 days | Status: AC

## 2017-07-15 MED ORDER — CLOBETASOL 0.05 % TOPICAL OINTMENT
OPHTHALMIC | 4 refills | 0.00000 days | Status: CP
Start: 2017-07-15 — End: 2017-07-15

## 2017-07-22 MED ORDER — TRIAMCINOLONE ACETONIDE 0.1 % TOPICAL OINTMENT
0 refills | 0 days | Status: CP
Start: 2017-07-22 — End: 2017-08-26

## 2017-08-15 ENCOUNTER — Ambulatory Visit: Admit: 2017-08-15 | Discharge: 2017-08-16 | Disposition: A | Discharge status: Home / Self Care | Payer: MEDICARE

## 2017-08-15 ENCOUNTER — Emergency Department: Admit: 2017-08-15 | Discharge: 2017-08-16 | Disposition: A | Discharge status: Home / Self Care | Payer: MEDICARE

## 2017-08-15 DIAGNOSIS — J111 Influenza due to unidentified influenza virus with other respiratory manifestations: Principal | ICD-10-CM

## 2017-08-15 MED ORDER — OSELTAMIVIR 75 MG CAPSULE
ORAL_CAPSULE | Freq: Two times a day (BID) | ORAL | 0 refills | 0.00000 days | Status: CP
Start: 2017-08-15 — End: 2017-08-20

## 2017-08-15 MED ORDER — BENZONATATE 100 MG CAPSULE
ORAL_CAPSULE | Freq: Two times a day (BID) | ORAL | 0 refills | 0 days | Status: CP | PRN
Start: 2017-08-15 — End: 2017-08-22

## 2017-08-15 MED ORDER — ONDANSETRON 4 MG DISINTEGRATING TABLET
ORAL_TABLET | Freq: Three times a day (TID) | ORAL | 0 refills | 0 days | Status: CP | PRN
Start: 2017-08-15 — End: 2017-08-22

## 2017-08-25 ENCOUNTER — Ambulatory Visit
Admit: 2017-08-25 | Discharge: 2017-08-26 | Payer: MEDICARE | Attending: Infectious Disease | Primary: Infectious Disease

## 2017-08-25 DIAGNOSIS — K863 Pseudocyst of pancreas: Secondary | ICD-10-CM

## 2017-08-25 DIAGNOSIS — B20 Human immunodeficiency virus [HIV] disease: Secondary | ICD-10-CM

## 2017-08-25 DIAGNOSIS — F1010 Alcohol abuse, uncomplicated: Secondary | ICD-10-CM

## 2017-08-25 MED ORDER — TRAMADOL 50 MG TABLET
ORAL_TABLET | Freq: Three times a day (TID) | ORAL | 1 refills | 0 days | Status: CP | PRN
Start: 2017-08-25 — End: 2018-02-16

## 2017-08-26 ENCOUNTER — Ambulatory Visit: Admit: 2017-08-26 | Discharge: 2017-08-27 | Payer: MEDICARE

## 2017-08-26 DIAGNOSIS — L859 Epidermal thickening, unspecified: Secondary | ICD-10-CM

## 2017-08-26 DIAGNOSIS — R21 Rash and other nonspecific skin eruption: Principal | ICD-10-CM

## 2017-08-26 MED ORDER — TRIAMCINOLONE ACETONIDE 0.1 % TOPICAL OINTMENT
3 refills | 0 days | Status: CP
Start: 2017-08-26 — End: 2017-10-20

## 2017-08-26 MED ORDER — UREA 20 % TOPICAL CREAM
11 refills | 0 days | Status: CP
Start: 2017-08-26 — End: ?

## 2017-09-02 ENCOUNTER — Ambulatory Visit: Admit: 2017-09-02 | Discharge: 2017-09-03 | Payer: MEDICARE

## 2017-09-02 DIAGNOSIS — M5431 Sciatica, right side: Principal | ICD-10-CM

## 2017-09-05 MED ORDER — CETIRIZINE 10 MG TABLET
ORAL_TABLET | 0 refills | 0 days | Status: CP
Start: 2017-09-05 — End: 2017-10-16

## 2017-09-08 MED ORDER — ESOMEPRAZOLE MAGNESIUM 40 MG CAPSULE,DELAYED RELEASE
ORAL_CAPSULE | 11 refills | 0 days | Status: CP
Start: 2017-09-08 — End: 2018-09-17

## 2017-09-09 ENCOUNTER — Ambulatory Visit: Admit: 2017-09-09 | Discharge: 2017-09-10 | Payer: MEDICARE

## 2017-09-09 DIAGNOSIS — M5430 Sciatica, unspecified side: Principal | ICD-10-CM

## 2017-09-18 MED ORDER — GABAPENTIN 300 MG CAPSULE
ORAL_CAPSULE | 6 refills | 0 days | Status: CP
Start: 2017-09-18 — End: 2018-02-16

## 2017-09-18 MED ORDER — TRIAMCINOLONE ACETONIDE 0.5 % TOPICAL CREAM
0 refills | 0 days | Status: CP
Start: 2017-09-18 — End: 2017-10-20

## 2017-10-09 ENCOUNTER — Ambulatory Visit: Admit: 2017-10-09 | Discharge: 2017-10-09 | Payer: MEDICARE

## 2017-10-09 DIAGNOSIS — M5416 Radiculopathy, lumbar region: Principal | ICD-10-CM

## 2017-10-09 DIAGNOSIS — M5417 Radiculopathy, lumbosacral region: Principal | ICD-10-CM

## 2017-10-16 MED ORDER — CETIRIZINE 10 MG TABLET
ORAL_TABLET | 11 refills | 0 days | Status: CP
Start: 2017-10-16 — End: 2018-10-20

## 2017-10-20 MED ORDER — TRIAMCINOLONE ACETONIDE 0.5 % TOPICAL CREAM
0 refills | 0 days | Status: CP
Start: 2017-10-20 — End: 2017-11-20

## 2017-10-23 ENCOUNTER — Ambulatory Visit: Admit: 2017-10-23 | Discharge: 2017-10-24 | Payer: MEDICARE | Attending: Family | Primary: Family

## 2017-10-23 DIAGNOSIS — J029 Acute pharyngitis, unspecified: Principal | ICD-10-CM

## 2017-10-23 DIAGNOSIS — R590 Localized enlarged lymph nodes: Secondary | ICD-10-CM

## 2017-10-23 IMAGING — CR DG CHEST 2V
2 series · 2 of 2 positions shown · non-contrast
Comparison: Chest radiograph [DATE]

CLINICAL DATA: RIGHT chest pain radiating to extremities. History
of HIV.

EXAM:
CHEST  2 VIEW

[chest lat]
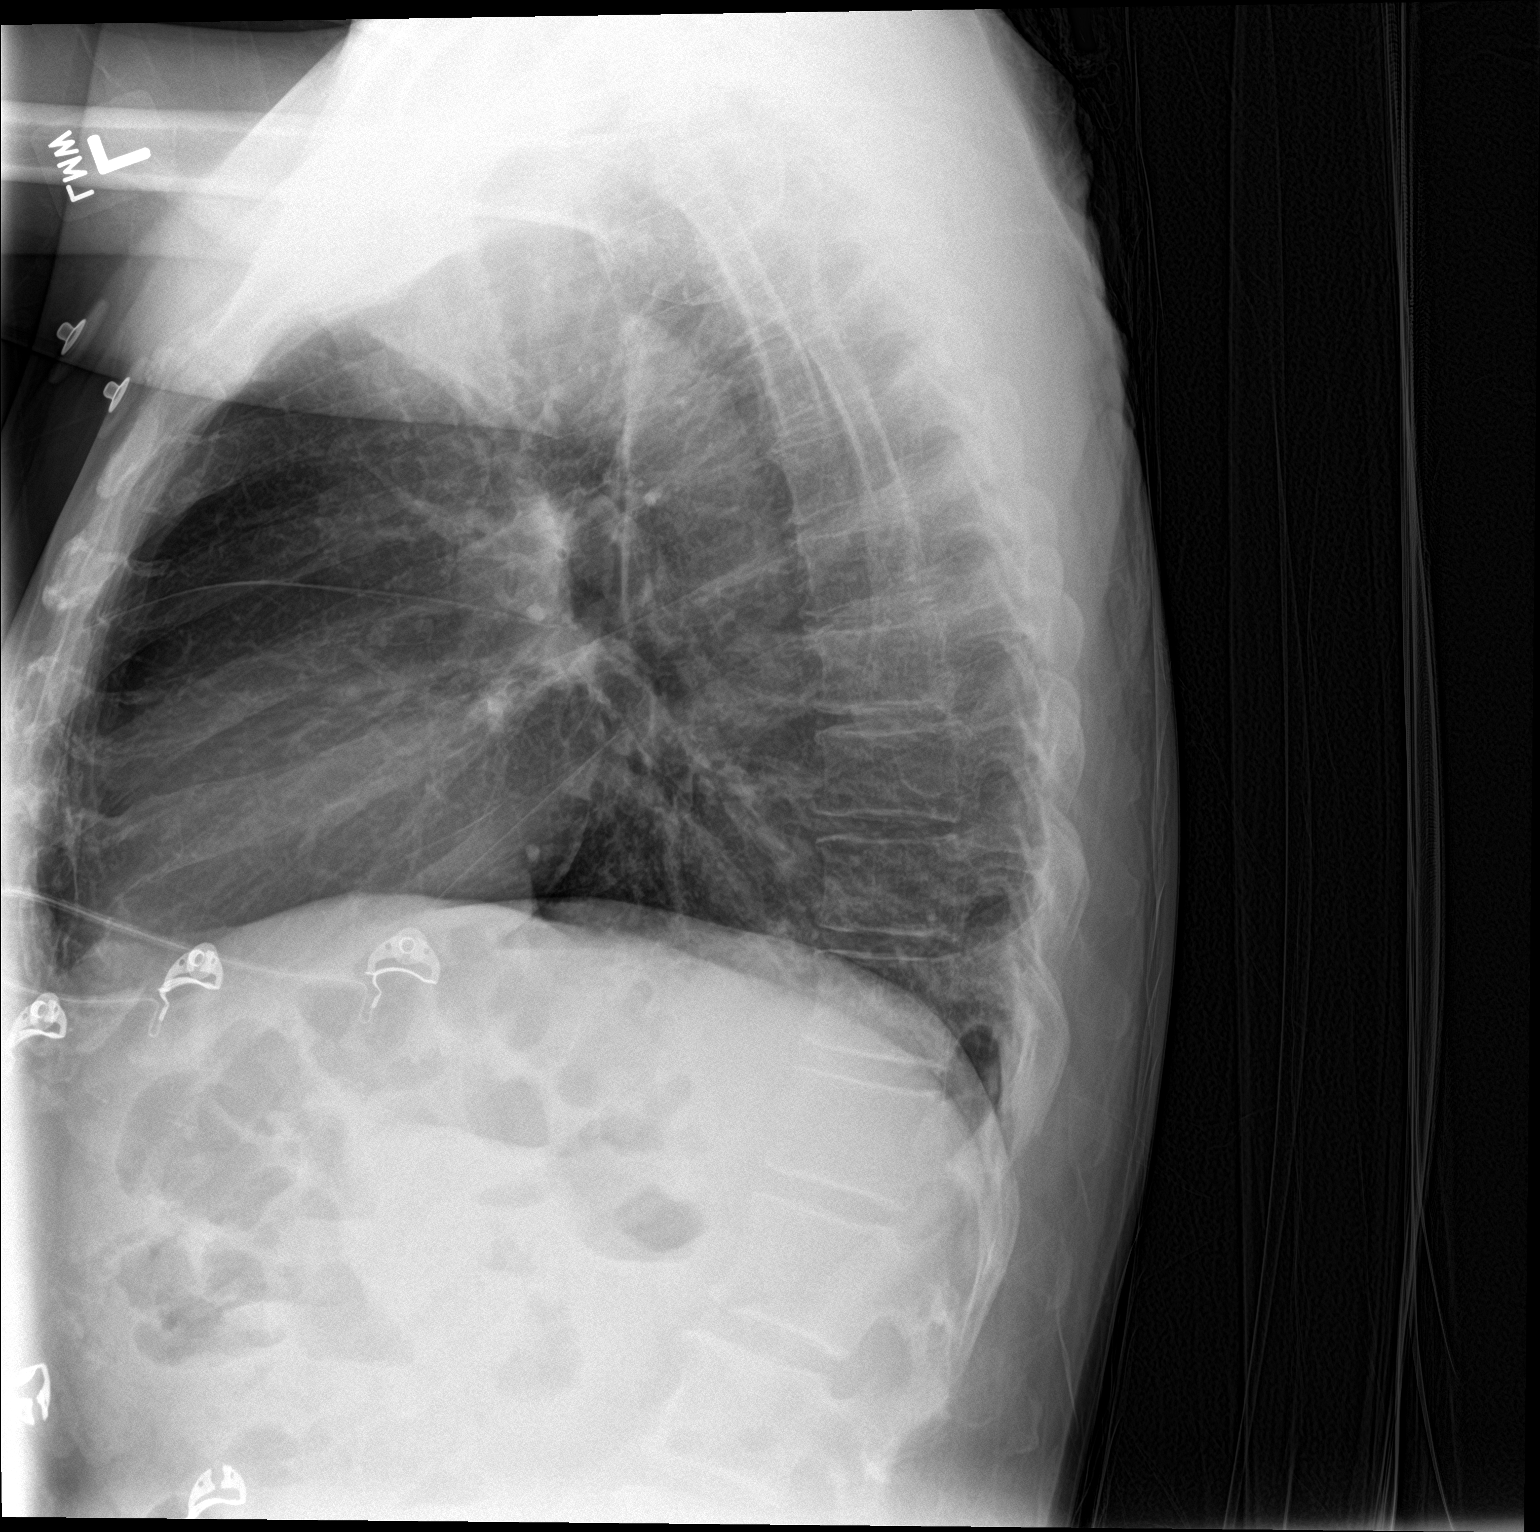

[chest ap]
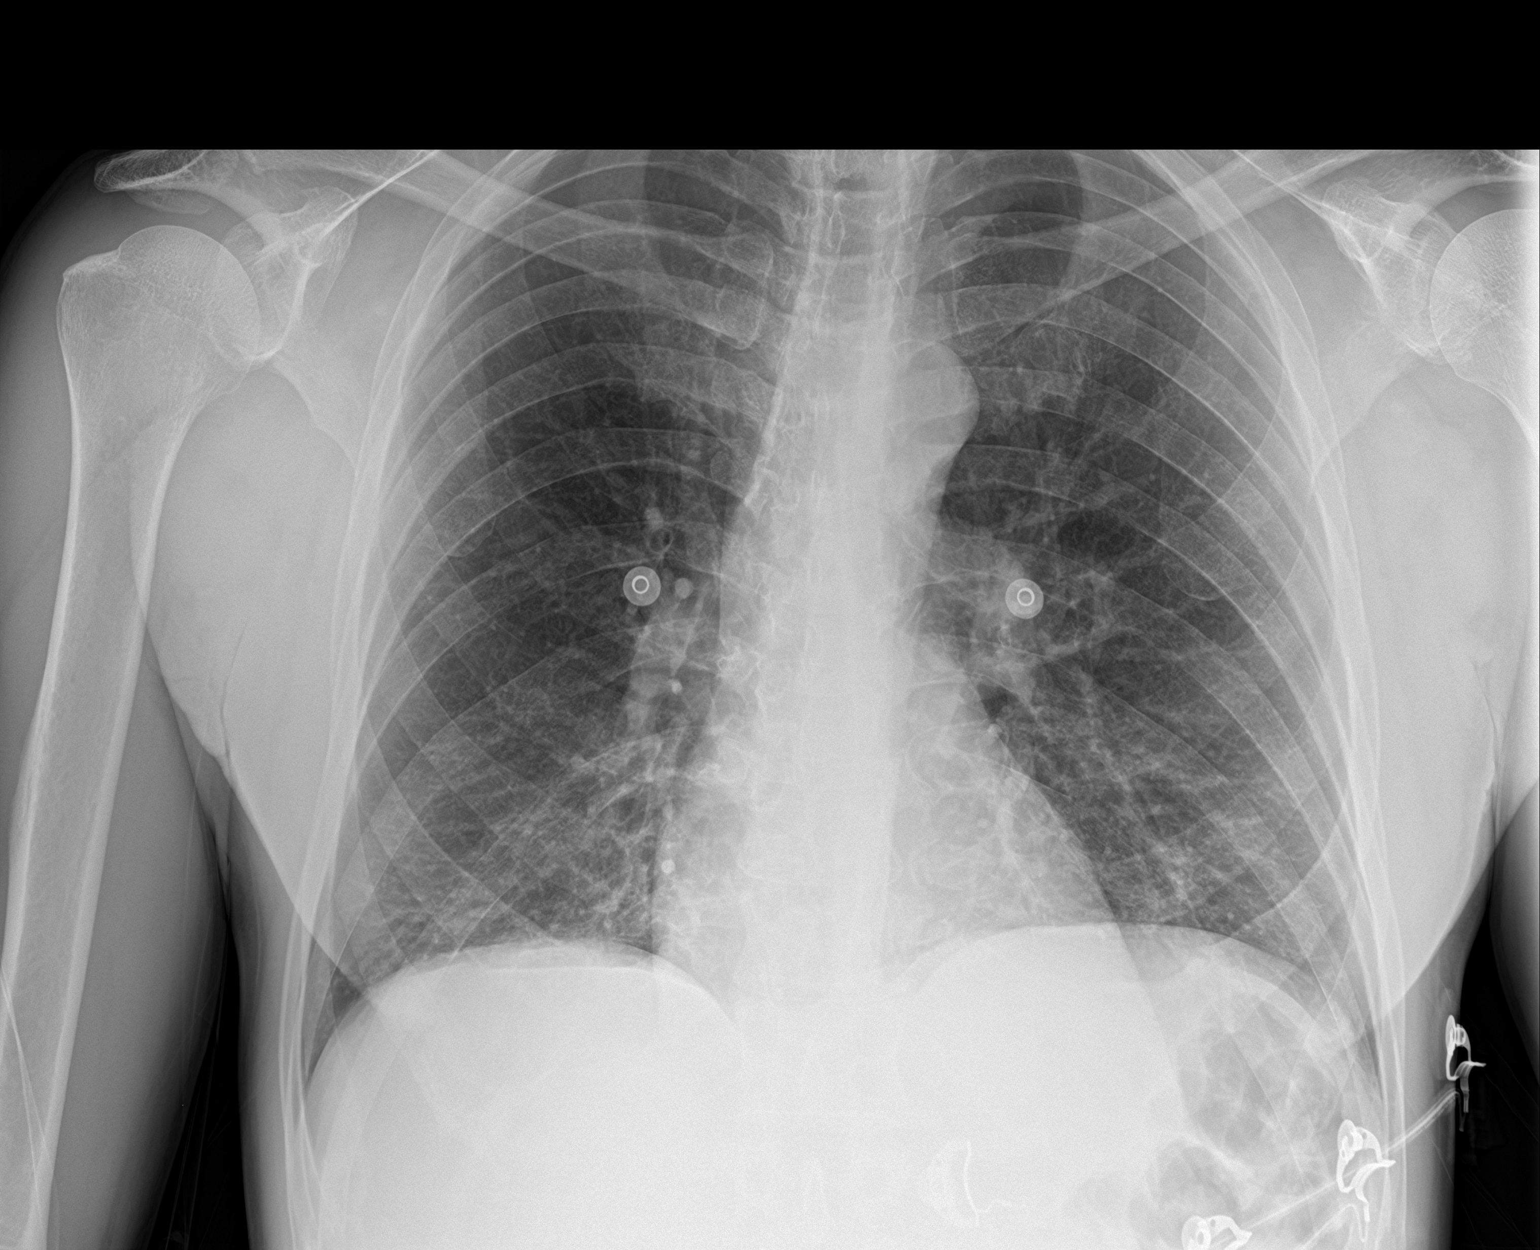

[2 of 2 positions shown; findings below may reference images not displayed]

FINDINGS: Cardiomediastinal silhouette is normal. No pleural effusions or
focal consolidations. Mild bibasilar interstitial prominence.
Trachea projects midline and there is no pneumothorax. Soft tissue
planes and included osseous structures are non-suspicious.
IMPRESSION: Bibasilar interstitial prominence could represent atelectasis or
atypical infection without focal consolidation.

## 2017-10-27 ENCOUNTER — Ambulatory Visit: Admit: 2017-10-27 | Discharge: 2017-10-28 | Payer: MEDICARE | Attending: Dentist | Primary: Dentist

## 2017-10-27 DIAGNOSIS — K029 Dental caries, unspecified: Principal | ICD-10-CM

## 2017-11-04 MED ORDER — ENALAPRIL MALEATE 20 MG TABLET
ORAL_TABLET | Freq: Every day | ORAL | 5 refills | 0.00000 days | Status: CP
Start: 2017-11-04 — End: 2018-06-03

## 2017-11-21 MED ORDER — TRIAMCINOLONE ACETONIDE 0.5 % TOPICAL CREAM
0 refills | 0 days | Status: CP
Start: 2017-11-21 — End: 2018-05-11

## 2017-12-16 ENCOUNTER — Ambulatory Visit: Admit: 2017-12-16 | Discharge: 2017-12-17 | Payer: MEDICARE

## 2017-12-16 DIAGNOSIS — K036 Deposits [accretions] on teeth: Principal | ICD-10-CM

## 2018-01-28 ENCOUNTER — Emergency Department
Admission: EM | Admit: 2018-01-28 | Discharge: 2018-01-28 | Disposition: A | Discharge status: Home / Self Care | Payer: Medicare Other | Attending: Emergency Medicine | Admitting: Emergency Medicine

## 2018-01-28 ENCOUNTER — Other Ambulatory Visit: Payer: Self-pay

## 2018-01-28 ENCOUNTER — Encounter: Payer: Self-pay | Admitting: *Deleted

## 2018-01-28 DIAGNOSIS — J449 Chronic obstructive pulmonary disease, unspecified: Secondary | ICD-10-CM | POA: Insufficient documentation

## 2018-01-28 DIAGNOSIS — R21 Rash and other nonspecific skin eruption: Secondary | ICD-10-CM | POA: Diagnosis present

## 2018-01-28 DIAGNOSIS — I10 Essential (primary) hypertension: Secondary | ICD-10-CM | POA: Insufficient documentation

## 2018-01-28 DIAGNOSIS — Z79899 Other long term (current) drug therapy: Secondary | ICD-10-CM | POA: Insufficient documentation

## 2018-01-28 DIAGNOSIS — B20 Human immunodeficiency virus [HIV] disease: Secondary | ICD-10-CM | POA: Insufficient documentation

## 2018-01-28 DIAGNOSIS — F17210 Nicotine dependence, cigarettes, uncomplicated: Secondary | ICD-10-CM | POA: Insufficient documentation

## 2018-01-28 DIAGNOSIS — Z8673 Personal history of transient ischemic attack (TIA), and cerebral infarction without residual deficits: Secondary | ICD-10-CM | POA: Diagnosis not present

## 2018-01-28 DIAGNOSIS — L409 Psoriasis, unspecified: Secondary | ICD-10-CM | POA: Insufficient documentation

## 2018-01-28 MED ORDER — TAZAROTENE 0.05 % EX CREA: TOPICAL_CREAM | Freq: Every day | CUTANEOUS | 0 refills | Status: DC

## 2018-01-28 MED ORDER — TRIAMCINOLONE ACETONIDE 0.5 % EX OINT: 1.0000 "application " | TOPICAL_OINTMENT | Freq: Two times a day (BID) | CUTANEOUS | 0 refills | Status: DC

## 2018-01-28 NOTE — ED Provider Notes (Signed)
[facility] Emergency Department Provider Note  ____________________________________________  Time seen: Approximately 2:24 PM  I have reviewed the triage vital signs and the nursing notes.   HISTORY  Chief Complaint Rash Positive for rash.   HPI Mason Martin is a 52 y.o. male that presents to the emergency department for evaluation of pruritic rash on his arm and back of neck for about 3 days.  Patient denies any new lotions, detergents, soaps.  No insect bites.  No new medications.  Patient takes medication regularly for HIV and follows up with [facility].  Patient states rash feels like poison ivy.  He took a Benadryl this morning which stopped the itching but rash did not go away.  He has also put calamine lotion on rash.  He denies any additional symptoms.   Past Medical History:  Diagnosis Date  . 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 COPD (chronic obstructive pulmonary disease) (HCC)   . GERD (gastroesophageal reflux disease)   . History of meningitis   . HIV disease (HCC)    on HAART, follows with ID at [facility]  . Hypertension   . Neck pain    with left arm radiculopathy  . Pancreas cyst   . Pancreatitis    chronic pancreatitis  . TIA (transient ischemic attack)   . Tobacco use     Patient Active Problem List   Diagnosis Date Noted  . HTN (hypertension) 05/27/2017  . GERD (gastroesophageal reflux disease) 05/27/2017  . COPD (chronic obstructive pulmonary disease) (HCC) 05/27/2017  . HIV (human immunodeficiency virus infection) (HCC) 05/27/2017  . Hypokalemia 05/27/2017  . Acute recurrent pancreatitis 07/01/2015    Past Surgical History:  Procedure Laterality Date  . ESOPHAGEAL ATRESIA REPAIR      Prior to Admission medications   Medication Sig Start Date End Date Taking? Authorizing Provider  cephALEXin (KEFLEX) 500 MG capsule Take 1 capsule (500 mg total) by mouth 3 (three) times daily. Patient not taking: Reported on  05/27/2017 03/26/17   Tommi Rumps, PA-C  cetirizine (ZYRTEC) 10 MG tablet Take 10 mg by mouth daily. 04/27/15   [provider]  CREON 24000 units CPEP Take 24,000 Units by mouth 3 (three) times daily with meals. 06/22/15   [provider]  cyclobenzaprine (FLEXERIL) 10 MG tablet Take 1 tablet (10 mg total) by mouth 3 (three) times daily as needed. 04/22/17   Joni Reining, PA-C  enalapril (VASOTEC) 20 MG tablet Take 20 mg by mouth daily. 06/22/15   [provider]  gabapentin (NEURONTIN) 300 MG capsule Take 300 mg by mouth 2 (two) times daily.    [provider]  meloxicam (MOBIC) 15 MG tablet Take 1 tablet (15 mg total) by mouth daily. 04/22/17   Joni Reining, PA-C  NEXIUM 40 MG capsule Take 40 mg by mouth daily before breakfast. 06/22/15   [provider]  PREZCOBIX 800-150 MG tablet Take 1 tablet by mouth daily.    [provider]  tazarotene (TAZORAC) 0.05 % cream Apply topically at bedtime. 01/28/18   Enid Derry, PA-C  TIVICAY 50 MG tablet Take 50 mg by mouth daily. 06/22/15   [provider]  traMADol (ULTRAM) 50 MG tablet Take 1 tablet (50 mg total) by mouth every 6 (six) hours as needed. 06/26/17 06/26/18  Jene Every, MD  triamcinolone ointment (KENALOG) 0.5 % Apply 1 application topically 2 (two) times daily. 01/28/18   Enid Derry, PA-C  VENTOLIN HFA 108 (90 Base) MCG/ACT inhaler Inhale 2 puffs into the lungs 4 (four) times daily as needed. For shortness of breath and/or wheezing. 06/22/15   [provider]    Allergies Sulfur  Family History  Problem Relation Age of Onset  . Heart failure Father   . Emphysema Father     Social History Social History   Tobacco Use  . Smoking status: Current Every Day Smoker    Packs/day: 0.25    Types: Cigarettes  . Smokeless tobacco: Never Used  Substance Use Topics  . Alcohol use: Yes    Alcohol/week: 0.0 oz  . Drug use: No     Review of  Systems  Constitutional: No fever/chills Cardiovascular: No chest pain. Respiratory: No cough. No SOB. Gastrointestinal: No abdominal pain.  No nausea, no vomiting.  Musculoskeletal: Negative for musculoskeletal pain. Skin: Negative for abrasions, lacerations, ecchymosis. Positive for rash.  Neurological: Negative for headaches, numbness or tingling   ____________________________________________   PHYSICAL EXAM:  VITAL SIGNS: ED Triage Vitals [01/28/18 1352]  Enc Vitals Group     BP 123/69     Pulse Rate 74     Resp 16     Temp 98.9 F (37.2 C)     Temp Source Oral     SpO2 98 %     Weight 145 lb (65.8 kg)     Height 5\' 4"  (1.626 m)     Head Circumference      Peak Flow      Pain Score 0     Pain Loc      Pain Edu?      Excl. in GC?      Constitutional: Alert and oriented. Well appearing and in no acute distress. Eyes: Conjunctivae are normal. PERRL. EOMI. Head: Atraumatic. ENT:      Ears:      Nose: No congestion/rhinnorhea.      Mouth/Throat: Mucous membranes are moist.  Neck: No stridor.  Cardiovascular: Normal rate, regular rhythm.  Good peripheral circulation. Respiratory: Normal respiratory effort without tachypnea or retractions. Lungs CTAB. Good air entry to the bases with no decreased or absent breath sounds. Musculoskeletal: Full range of motion to all extremities. No gross deformities appreciated. Neurologic:  Normal speech and language. No gross focal neurologic deficits are appreciated.  Skin:  Skin is warm, dry and intact.  1/2 cm to 1 cm plaques to bilateral elbows, worse on the right.  Few plaques to upper back. No vesicles, papules. Psychiatric: Mood and affect are normal. Speech and behavior are normal. Patient exhibits appropriate insight and judgement.   ____________________________________________   LABS (all labs ordered are listed, but only abnormal results are displayed)  Labs Reviewed - No data to  display ____________________________________________  EKG   ____________________________________________  RADIOLOGY   No results found.  ____________________________________________    PROCEDURES  Procedure(s) performed:    Procedures    Medications - No data to display   ____________________________________________   INITIAL IMPRESSION / ASSESSMENT AND PLAN / ED COURSE  Pertinent labs & imaging results that were available during my care of the patient were reviewed by me and considered in my medical decision making (see chart for details).  Review of the [place] CSRS was performed in accordance of the [facility] prior to dispensing any controlled drugs.     Patient's diagnosis is consistent with psoriasis.  Vital signs and exam are reassuring.  Patient has a history of psoriasis.  Patient will be discharged home with  prescriptions for triamcinolone and tazarotene. Patient is to follow up with dermatology as directed.  Patient has an appointment with dermatology in 1.5 weeks.  Patient is given ED precautions to return to the ED for any worsening or new symptoms.     ____________________________________________  FINAL CLINICAL IMPRESSION(S) / ED DIAGNOSES  Final diagnoses:  Psoriasis      NEW MEDICATIONS STARTED DURING THIS VISIT:  ED Discharge Orders        Ordered    triamcinolone ointment (KENALOG) 0.5 %  2 times daily     01/28/18 1441    tazarotene (TAZORAC) 0.05 % cream  Daily at bedtime     01/28/18 1441          This chart was dictated using voice recognition software/Dragon. Despite best efforts to proofread, errors can occur which can change the meaning. Any change was purely unintentional.    Enid DerryWagner, Carlin Attridge, PA-C 01/29/18 0715    Arnaldo NatalMalinda, Paul F, MD 01/29/18 [phone]

## 2018-01-28 NOTE — ED Triage Notes (Signed)
Pt reporting a rash on his arms and upper back for the past two days. Itchiness reported. No swelling. PT reports it feels as though he has poison ivy but denies having been in the woods or near poison ivy. No relief with lotions at home. No SOB or throat swelling.

## 2018-02-02 MED ORDER — VENTOLIN HFA 90 MCG/ACTUATION AEROSOL INHALER
11 refills | 0 days | Status: CP
Start: 2018-02-02 — End: ?

## 2018-02-16 ENCOUNTER — Ambulatory Visit
Admit: 2018-02-16 | Discharge: 2018-02-17 | Payer: MEDICARE | Attending: Infectious Disease | Primary: Infectious Disease

## 2018-02-16 DIAGNOSIS — E785 Hyperlipidemia, unspecified: Secondary | ICD-10-CM

## 2018-02-16 DIAGNOSIS — B20 Human immunodeficiency virus [HIV] disease: Principal | ICD-10-CM

## 2018-02-16 DIAGNOSIS — I10 Essential (primary) hypertension: Secondary | ICD-10-CM

## 2018-02-16 MED ORDER — GABAPENTIN 300 MG CAPSULE
ORAL_CAPSULE | 11 refills | 0 days | Status: CP
Start: 2018-02-16 — End: 2018-09-23

## 2018-03-30 MED ORDER — TIVICAY 50 MG TABLET
ORAL_TABLET | 11 refills | 0 days | Status: CP
Start: 2018-03-30 — End: 2019-02-17

## 2018-03-30 MED ORDER — CREON 24,000-76,000-120,000 UNIT CAPSULE,DELAYED RELEASE
ORAL_CAPSULE | 6 refills | 0 days | Status: CP
Start: 2018-03-30 — End: 2018-10-07

## 2018-03-30 MED ORDER — PREZCOBIX 800 MG-150 MG TABLET
ORAL_TABLET | 11 refills | 0 days | Status: CP
Start: 2018-03-30 — End: 2019-02-17

## 2018-04-02 ENCOUNTER — Ambulatory Visit: Admit: 2018-04-02 | Discharge: 2018-04-03 | Payer: MEDICARE

## 2018-04-02 DIAGNOSIS — M5416 Radiculopathy, lumbar region: Principal | ICD-10-CM

## 2018-04-13 ENCOUNTER — Ambulatory Visit: Admit: 2018-04-13 | Discharge: 2018-04-14 | Payer: MEDICARE

## 2018-04-13 DIAGNOSIS — R21 Rash and other nonspecific skin eruption: Principal | ICD-10-CM

## 2018-04-13 MED ORDER — CLOBETASOL 0.05 % TOPICAL OINTMENT
Freq: Two times a day (BID) | TOPICAL | 1 refills | 0 days | Status: CP
Start: 2018-04-13 — End: 2019-04-13

## 2018-04-13 MED ORDER — IVERMECTIN 3 MG TABLET
ORAL_TABLET | Freq: Once | ORAL | 0 refills | 0 days | Status: CP
Start: 2018-04-13 — End: 2018-04-13

## 2018-04-13 MED ORDER — PREDNISONE 10 MG TABLET
ORAL_TABLET | 0 refills | 0 days | Status: CP
Start: 2018-04-13 — End: 2018-06-15

## 2018-05-11 ENCOUNTER — Ambulatory Visit: Admit: 2018-05-11 | Discharge: 2018-05-12 | Payer: MEDICARE | Attending: Dermatology | Primary: Dermatology

## 2018-05-11 DIAGNOSIS — R21 Rash and other nonspecific skin eruption: Principal | ICD-10-CM

## 2018-05-11 MED ORDER — TRIAMCINOLONE ACETONIDE 0.1 % TOPICAL OINTMENT
5 refills | 0 days | Status: CP
Start: 2018-05-11 — End: 2018-12-31

## 2018-06-01 ENCOUNTER — Ambulatory Visit: Admit: 2018-06-01 | Discharge: 2018-06-01 | Disposition: A | Discharge status: Home / Self Care | Payer: MEDICARE

## 2018-06-03 MED ORDER — ENALAPRIL MALEATE 20 MG TABLET
ORAL_TABLET | 0 refills | 0 days | Status: CP
Start: 2018-06-03 — End: 2018-09-10

## 2018-06-09 ENCOUNTER — Ambulatory Visit: Admit: 2018-06-09 | Discharge: 2018-06-10 | Payer: MEDICARE | Attending: General Practice | Primary: General Practice

## 2018-06-09 DIAGNOSIS — K029 Dental caries, unspecified: Principal | ICD-10-CM

## 2018-06-09 MED ORDER — SODIUM FLUORIDE 1.1 % DENTAL GEL
5 refills | 0 days | Status: CP
Start: 2018-06-09 — End: ?

## 2018-06-15 ENCOUNTER — Ambulatory Visit
Admit: 2018-06-15 | Discharge: 2018-06-16 | Payer: MEDICARE | Attending: Infectious Disease | Primary: Infectious Disease

## 2018-06-15 DIAGNOSIS — D8989 Other specified disorders involving the immune mechanism, not elsewhere classified: Secondary | ICD-10-CM

## 2018-06-15 DIAGNOSIS — B20 Human immunodeficiency virus [HIV] disease: Principal | ICD-10-CM

## 2018-06-15 DIAGNOSIS — M5431 Sciatica, right side: Secondary | ICD-10-CM

## 2018-06-15 MED ORDER — VARICELLA-ZOSTER GLYCOE VACC-AS01B ADJ(PF) 50 MCG/0.5 ML IM SUSP, KIT
Freq: Once | INTRAMUSCULAR | 0 refills | 0 days | Status: CP
Start: 2018-06-15 — End: 2018-06-15

## 2018-06-15 MED ORDER — OXYCODONE 10 MG TABLET
ORAL_TABLET | Freq: Every day | ORAL | 0 refills | 0 days | Status: CP | PRN
Start: 2018-06-15 — End: 2018-12-31

## 2018-07-27 MED ORDER — AMLODIPINE 5 MG TABLET
ORAL_TABLET | 11 refills | 0 days | Status: CP
Start: 2018-07-27 — End: ?

## 2018-07-27 MED ORDER — SUCRALFATE 1 GRAM TABLET
ORAL_TABLET | 11 refills | 0 days | Status: CP
Start: 2018-07-27 — End: ?

## 2018-08-28 ENCOUNTER — Emergency Department: Payer: Medicare Other

## 2018-08-28 ENCOUNTER — Other Ambulatory Visit: Payer: Self-pay

## 2018-08-28 ENCOUNTER — Encounter: Payer: Self-pay | Admitting: Internal Medicine

## 2018-08-28 ENCOUNTER — Emergency Department
Admission: EM | Admit: 2018-08-28 | Discharge: 2018-08-28 | Disposition: A | Discharge status: Home / Self Care | Payer: Medicare Other | Attending: Emergency Medicine | Admitting: Emergency Medicine

## 2018-08-28 DIAGNOSIS — J00 Acute nasopharyngitis [common cold]: Secondary | ICD-10-CM | POA: Insufficient documentation

## 2018-08-28 DIAGNOSIS — R05 Cough: Secondary | ICD-10-CM | POA: Diagnosis present

## 2018-08-28 DIAGNOSIS — I10 Essential (primary) hypertension: Secondary | ICD-10-CM | POA: Diagnosis not present

## 2018-08-28 DIAGNOSIS — F17210 Nicotine dependence, cigarettes, uncomplicated: Secondary | ICD-10-CM | POA: Insufficient documentation

## 2018-08-28 DIAGNOSIS — Z79899 Other long term (current) drug therapy: Secondary | ICD-10-CM | POA: Diagnosis not present

## 2018-08-28 DIAGNOSIS — J449 Chronic obstructive pulmonary disease, unspecified: Secondary | ICD-10-CM | POA: Insufficient documentation

## 2018-08-28 MED ORDER — AZITHROMYCIN 250 MG PO TABS: ORAL_TABLET | ORAL | 0 refills | Status: DC

## 2018-08-28 MED ORDER — PSEUDOEPH-BROMPHEN-DM 30-2-10 MG/5ML PO SYRP: 5.0000 mL | ORAL_SOLUTION | Freq: Four times a day (QID) | ORAL | 0 refills | Status: DC | PRN

## 2018-08-28 NOTE — Discharge Instructions (Signed)
You were seen today for cough and shortness of breath.  Your chest x-ray is negative for pneumonia.  And given you prescriptions for antibiotics to take for the next 5 days and cough syrup to take 4 times a day as needed.  You would need to be seen again if you develop fever, worsening cough, worsening shortness of breath.

## 2018-08-28 NOTE — ED Triage Notes (Signed)
Reports cough and cold for approximately 1 week.

## 2018-08-28 NOTE — ED Provider Notes (Signed)
[facility] Emergency Department Provider Note ____________________________________________  Time seen: 2050  I have reviewed the triage vital signs and the nursing notes.  HISTORY  Chief Complaint  Cough   HPI Mason Martin is a 53 y.o. male presents to the ER today with complaint of runny nose, nasal congestion, cough and shortness of breath.  He reports this started 1 week ago.  He is blowing yellow mucus out of his nose.  The cough is productive of yellow mucus.  He denies ear pain, sore throat, chest pain or chest tightness.  He denies fever, chills or body aches.  He has taken Tylenol and NyQuil with minimal relief.  He has a history of COPD and does continue to smoke.  He is not currently on any inhalers.  He does have a history of HIV but his last viral load was undetectable.  He is not sure what his last CD4 count was.  Past Medical History:  Diagnosis Date  . 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 COPD (chronic obstructive pulmonary disease) (HCC)   . GERD (gastroesophageal reflux disease)   . History of meningitis   . HIV disease (HCC)    on HAART, follows with ID at [facility]  . Hypertension   . Neck pain    with left arm radiculopathy  . Pancreas cyst   . Pancreatitis    chronic pancreatitis  . TIA (transient ischemic attack)   . Tobacco use     Patient Active Problem List   Diagnosis Date Noted  . HTN (hypertension) 05/27/2017  . GERD (gastroesophageal reflux disease) 05/27/2017  . COPD (chronic obstructive pulmonary disease) (HCC) 05/27/2017  . HIV (human immunodeficiency virus infection) (HCC) 05/27/2017  . Hypokalemia 05/27/2017  . Acute recurrent pancreatitis 07/01/2015    Past Surgical History:  Procedure Laterality Date  . ESOPHAGEAL ATRESIA REPAIR      Prior to Admission medications   Medication Sig Start Date End Date Taking? Authorizing Provider  azithromycin (ZITHROMAX) 250 MG tablet Take 2 tabs today, then  1 tab daily x 4 days 08/28/18   Lorre Munroe, NP  brompheniramine-pseudoephedrine-DM 30-2-10 MG/5ML syrup Take 5 mLs by mouth 4 (four) times daily as needed. 08/28/18   Lorre Munroe, NP  cetirizine (ZYRTEC) 10 MG tablet Take 10 mg by mouth daily. 04/27/15   [provider]  CREON 24000 units CPEP Take 24,000 Units by mouth 3 (three) times daily with meals. 06/22/15   [provider]  cyclobenzaprine (FLEXERIL) 10 MG tablet Take 1 tablet (10 mg total) by mouth 3 (three) times daily as needed. 04/22/17   Joni Reining, PA-C  enalapril (VASOTEC) 20 MG tablet Take 20 mg by mouth daily. 06/22/15   [provider]  gabapentin (NEURONTIN) 300 MG capsule Take 300 mg by mouth 2 (two) times daily.    [provider]  meloxicam (MOBIC) 15 MG tablet Take 1 tablet (15 mg total) by mouth daily. 04/22/17   Joni Reining, PA-C  NEXIUM 40 MG capsule Take 40 mg by mouth daily before breakfast. 06/22/15   [provider]  PREZCOBIX 800-150 MG tablet Take 1 tablet by mouth daily.    [provider]  tazarotene (TAZORAC) 0.05 % cream Apply topically at bedtime. 01/28/18   Enid Derry, PA-C  TIVICAY 50 MG tablet Take 50 mg by mouth daily. 06/22/15   [provider]  triamcinolone ointment (KENALOG) 0.5 % Apply 1 application  topically 2 (two) times daily. 01/28/18   Enid Derry, PA-C  VENTOLIN HFA 108 (90 Base) MCG/ACT inhaler Inhale 2 puffs into the lungs 4 (four) times daily as needed. For shortness of breath and/or wheezing. 06/22/15   [provider]    Allergies Sulfur  Family History  Problem Relation Age of Onset  . Heart failure Father   . Emphysema Father     Social History Social History   Tobacco Use  . Smoking status: Current Every Day Smoker    Packs/day: 0.25    Types: Cigarettes  . Smokeless tobacco: Never Used  Substance Use Topics  . Alcohol use: Yes    Alcohol/week: 0.0 standard drinks  . Drug use: No     Review of Systems  Constitutional: Negative for fever, chills or body aches. ENT: Positive for runny nose, nasal congestion.  Negative for ear pain or sore throat. Cardiovascular: Negative for chest pain or chest tightness. Respiratory: Positive for cough and shortness of breath. Gastrointestinal: Negative for abdominal pain, nausea, vomiting and diarrhea. Skin: Negative for rash.  ____________________________________________  PHYSICAL EXAM:  VITAL SIGNS: ED Triage Vitals  Enc Vitals Group     BP 08/28/18 2025 (!) 133/94     Pulse Rate 08/28/18 2025 79     Resp 08/28/18 2025 18     Temp 08/28/18 2025 98.6 F (37 C)     Temp Source 08/28/18 2025 Oral     SpO2 08/28/18 2025 96 %     Weight 08/28/18 2024 140 lb (63.5 kg)     Height 08/28/18 2024 5\' 5"  (1.651 m)     Head Circumference --      Peak Flow --      Pain Score 08/28/18 2024 5     Pain Loc --      Pain Edu? --      Excl. in GC? --     Constitutional: Alert and oriented. Well appearing and in no distress. Head: Normocephalic without sinus tenderness noted. Ears: Canals clear. TMs intact bilaterally. Nose: No congestion/rhinorrhea/epistaxis. Mouth/Throat: Mucous membranes are moist.  Posterior pharynx noted with + PND.  No erythema or exudate noted. Hematological/Lymphatic/Immunological: No cervical lymphadenopathy. Cardiovascular: Normal rate, regular rhythm.  Respiratory: Normal respiratory effort.  Clear but diminished.  No wheezes/rales/rhonchi. Neurologic: No gross focal neurologic deficits are appreciated. ______________________________________________   RADIOLOGY  Imaging Orders     DG Chest 2 View  IMPRESSION:  No active cardiopulmonary disease.    ____________________________________________    INITIAL IMPRESSION / ASSESSMENT AND PLAN / ED COURSE  Runny Nose, Nasal Congestion, Cough, SOB:  DDX include viral URI with cough, URI, Acute bronchitis with COPD or PNA Chest xray negative RX  for Azithromax 250 mg x 5 days RX for Bromfed cough syrup Return precautions discussed ____________________________________________  FINAL CLINICAL IMPRESSION(S) / ED DIAGNOSES  Final diagnoses:  Acute nasopharyngitis   Nicki Reaper, NP    Lorre Munroe, NP 08/28/18 2141    Dionne Bucy, MD 08/28/18 2323

## 2018-09-10 MED ORDER — ENALAPRIL MALEATE 20 MG TABLET
ORAL_TABLET | 11 refills | 0 days | Status: CP
Start: 2018-09-10 — End: 2018-11-16

## 2018-09-17 MED ORDER — ESOMEPRAZOLE MAGNESIUM 40 MG CAPSULE,DELAYED RELEASE
ORAL_CAPSULE | 11 refills | 0 days | Status: CP
Start: 2018-09-17 — End: ?

## 2018-09-23 MED ORDER — GABAPENTIN 300 MG CAPSULE
ORAL_CAPSULE | 11 refills | 0 days | Status: CP
Start: 2018-09-23 — End: ?

## 2018-09-28 ENCOUNTER — Ambulatory Visit
Admit: 2018-09-28 | Discharge: 2018-09-29 | Payer: MEDICARE | Attending: Infectious Disease | Primary: Infectious Disease

## 2018-09-28 DIAGNOSIS — B20 Human immunodeficiency virus [HIV] disease: Principal | ICD-10-CM

## 2018-10-07 MED ORDER — CREON 24,000-76,000-120,000 UNIT CAPSULE,DELAYED RELEASE
ORAL_CAPSULE | 11 refills | 0 days | Status: CP
Start: 2018-10-07 — End: ?

## 2018-10-18 ENCOUNTER — Inpatient Hospital Stay
Admission: EM | Admit: 2018-10-18 | Discharge: 2018-10-19 | DRG: 640 | Disposition: A | Discharge status: Home / Self Care | Payer: Medicare Other | Attending: Specialist | Admitting: Specialist

## 2018-10-18 ENCOUNTER — Emergency Department: Payer: Medicare Other

## 2018-10-18 ENCOUNTER — Encounter: Payer: Self-pay | Admitting: Emergency Medicine

## 2018-10-18 ENCOUNTER — Other Ambulatory Visit: Payer: Self-pay

## 2018-10-18 DIAGNOSIS — Z791 Long term (current) use of non-steroidal anti-inflammatories (NSAID): Secondary | ICD-10-CM

## 2018-10-18 DIAGNOSIS — K2971 Gastritis, unspecified, with bleeding: Secondary | ICD-10-CM | POA: Diagnosis not present

## 2018-10-18 DIAGNOSIS — K861 Other chronic pancreatitis: Secondary | ICD-10-CM | POA: Diagnosis not present

## 2018-10-18 DIAGNOSIS — F1010 Alcohol abuse, uncomplicated: Secondary | ICD-10-CM | POA: Diagnosis present

## 2018-10-18 DIAGNOSIS — K2100 Gastro-esophageal reflux disease with esophagitis, without bleeding: Secondary | ICD-10-CM

## 2018-10-18 DIAGNOSIS — K920 Hematemesis: Secondary | ICD-10-CM | POA: Diagnosis not present

## 2018-10-18 DIAGNOSIS — E876 Hypokalemia: Principal | ICD-10-CM | POA: Diagnosis present

## 2018-10-18 DIAGNOSIS — F17210 Nicotine dependence, cigarettes, uncomplicated: Secondary | ICD-10-CM | POA: Diagnosis not present

## 2018-10-18 DIAGNOSIS — Z21 Asymptomatic human immunodeficiency virus [HIV] infection status: Secondary | ICD-10-CM | POA: Diagnosis present

## 2018-10-18 DIAGNOSIS — M542 Cervicalgia: Secondary | ICD-10-CM | POA: Diagnosis present

## 2018-10-18 DIAGNOSIS — I10 Essential (primary) hypertension: Secondary | ICD-10-CM | POA: Diagnosis present

## 2018-10-18 DIAGNOSIS — Z8673 Personal history of transient ischemic attack (TIA), and cerebral infarction without residual deficits: Secondary | ICD-10-CM | POA: Diagnosis not present

## 2018-10-18 DIAGNOSIS — Z8661 Personal history of infections of the central nervous system: Secondary | ICD-10-CM

## 2018-10-18 DIAGNOSIS — Z8249 Family history of ischemic heart disease and other diseases of the circulatory system: Secondary | ICD-10-CM

## 2018-10-18 DIAGNOSIS — Z825 Family history of asthma and other chronic lower respiratory diseases: Secondary | ICD-10-CM

## 2018-10-18 DIAGNOSIS — J449 Chronic obstructive pulmonary disease, unspecified: Secondary | ICD-10-CM | POA: Diagnosis not present

## 2018-10-18 DIAGNOSIS — R1013 Epigastric pain: Secondary | ICD-10-CM

## 2018-10-18 DIAGNOSIS — R112 Nausea with vomiting, unspecified: Secondary | ICD-10-CM

## 2018-10-18 DIAGNOSIS — Z79899 Other long term (current) drug therapy: Secondary | ICD-10-CM | POA: Diagnosis not present

## 2018-10-18 LAB — CBC
HCT: 41.8 % (ref 39.0–52.0)
Hemoglobin: 14.3 g/dL (ref 13.0–17.0)
MCH: 29.6 pg (ref 26.0–34.0)
MCHC: 34.2 g/dL (ref 30.0–36.0)
MCV: 86.5 fL (ref 80.0–100.0)
Platelets: 395 10*3/uL (ref 150–400)
RBC: 4.83 MIL/uL (ref 4.22–5.81)
RDW: 14.6 % (ref 11.5–15.5)
WBC: 6.8 10*3/uL (ref 4.0–10.5)
nRBC: 0 % (ref 0.0–0.2)

## 2018-10-18 LAB — POTASSIUM: Potassium: 3.3 mmol/L — ABNORMAL LOW (ref 3.5–5.1)

## 2018-10-18 LAB — URINALYSIS, COMPLETE (UACMP) WITH MICROSCOPIC
Bacteria, UA: NONE SEEN
Bilirubin Urine: NEGATIVE
Glucose, UA: NEGATIVE mg/dL
Hgb urine dipstick: NEGATIVE
Ketones, ur: NEGATIVE mg/dL
Leukocytes,Ua: NEGATIVE
Nitrite: NEGATIVE
Protein, ur: NEGATIVE mg/dL
Specific Gravity, Urine: 1.013 (ref 1.005–1.030)
Squamous Epithelial / LPF: NONE SEEN (ref 0–5)
pH: 7 (ref 5.0–8.0)

## 2018-10-18 LAB — TROPONIN I: Troponin I: <0.03 ng/mL (ref <0.03)

## 2018-10-18 LAB — COMPREHENSIVE METABOLIC PANEL
ALT: 13 U/L (ref 0–44)
AST: 21 U/L (ref 15–41)
Albumin: 4.3 g/dL (ref 3.5–5.0)
Alkaline Phosphatase: 103 U/L (ref 38–126)
Anion gap: 15 (ref 5–15)
BUN: 11 mg/dL (ref 6–20)
CO2: 26 mmol/L (ref 22–32)
Calcium: 8.7 mg/dL — ABNORMAL LOW (ref 8.9–10.3)
Chloride: 99 mmol/L (ref 98–111)
Creatinine, Ser: 0.72 mg/dL (ref 0.61–1.24)
GFR calc Af Amer: >60 mL/min (ref >60)
GFR calc non Af Amer: >60 mL/min (ref >60)
Glucose, Bld: 114 mg/dL — ABNORMAL HIGH (ref 70–99)
Potassium: 2.2 mmol/L — CL (ref 3.5–5.1)
Sodium: 140 mmol/L (ref 135–145)
Total Bilirubin: 0.4 mg/dL (ref 0.3–1.2)
Total Protein: 7.5 g/dL (ref 6.5–8.1)

## 2018-10-18 LAB — ETHANOL: Alcohol, Ethyl (B): 83 mg/dL — ABNORMAL HIGH (ref <10)

## 2018-10-18 LAB — LIPASE, BLOOD: Lipase: 23 U/L (ref 11–51)

## 2018-10-18 LAB — TSH: TSH: 2.399 u[IU]/mL (ref 0.350–4.500)

## 2018-10-18 LAB — MAGNESIUM: Magnesium: 1.6 mg/dL — ABNORMAL LOW (ref 1.7–2.4)

## 2018-10-18 MED ORDER — SODIUM CHLORIDE 0.45 % IV SOLN
INTRAVENOUS | Status: DC
  Administered 2018-10-18: 06:00:00 via INTRAVENOUS
  Filled 2018-10-18 (×2): qty 1000

## 2018-10-18 MED ORDER — ENOXAPARIN SODIUM 40 MG/0.4ML ~~LOC~~ SOLN: 40.0000 mg | SUBCUTANEOUS | Status: DC

## 2018-10-18 MED ORDER — MORPHINE SULFATE (PF) 2 MG/ML IV SOLN
2.0000 mg | INTRAVENOUS | Status: DC | PRN
  Administered 2018-10-18 (×2): 2 mg via INTRAVENOUS
  Filled 2018-10-18 (×2): qty 1

## 2018-10-18 MED ORDER — LORAZEPAM 1 MG PO TABS: 1.0000 mg | ORAL_TABLET | Freq: Four times a day (QID) | ORAL | Status: DC | PRN

## 2018-10-18 MED ORDER — LORAZEPAM 2 MG/ML IJ SOLN: 1.0000 mg | Freq: Four times a day (QID) | INTRAMUSCULAR | Status: DC | PRN

## 2018-10-18 MED ORDER — LORAZEPAM 2 MG/ML IJ SOLN: 0.0000 mg | Freq: Two times a day (BID) | INTRAMUSCULAR | Status: DC

## 2018-10-18 MED ORDER — SODIUM CHLORIDE 0.9 % IV BOLUS
1000.0000 mL | Freq: Once | INTRAVENOUS | Status: AC
  Administered 2018-10-18: 05:00:00 1000 mL via INTRAVENOUS

## 2018-10-18 MED ORDER — TRIAMCINOLONE ACETONIDE 0.5 % EX OINT
TOPICAL_OINTMENT | Freq: Two times a day (BID) | CUTANEOUS | Status: DC
  Administered 2018-10-18 – 2018-10-19 (×2): via TOPICAL
  Filled 2018-10-18: qty 15

## 2018-10-18 MED ORDER — THIAMINE HCL 100 MG/ML IJ SOLN: 100.0000 mg | Freq: Every day | INTRAMUSCULAR | Status: DC

## 2018-10-18 MED ORDER — VITAMIN B-1 100 MG PO TABS
100.0000 mg | ORAL_TABLET | Freq: Every day | ORAL | Status: DC
  Administered 2018-10-18 – 2018-10-19 (×2): 100 mg via ORAL
  Filled 2018-10-18 (×2): qty 1

## 2018-10-18 MED ORDER — ONDANSETRON HCL 4 MG/2ML IJ SOLN
4.0000 mg | Freq: Once | INTRAMUSCULAR | Status: AC
  Administered 2018-10-18: 05:00:00 4 mg via INTRAVENOUS
  Filled 2018-10-18: qty 2

## 2018-10-18 MED ORDER — MORPHINE SULFATE (PF) 2 MG/ML IV SOLN
2.0000 mg | Freq: Once | INTRAVENOUS | Status: AC
  Administered 2018-10-18: 2 mg via INTRAVENOUS

## 2018-10-18 MED ORDER — PANTOPRAZOLE SODIUM 40 MG IV SOLR
40.0000 mg | Freq: Two times a day (BID) | INTRAVENOUS | Status: DC
  Administered 2018-10-18 – 2018-10-19 (×3): 40 mg via INTRAVENOUS
  Filled 2018-10-18 (×3): qty 40

## 2018-10-18 MED ORDER — POTASSIUM CHLORIDE IN NACL 40-0.9 MEQ/L-% IV SOLN
INTRAVENOUS | Status: DC
  Administered 2018-10-18 – 2018-10-19 (×3): 125 mL/h via INTRAVENOUS
  Filled 2018-10-18 (×6): qty 1000

## 2018-10-18 MED ORDER — POTASSIUM CHLORIDE CRYS ER 20 MEQ PO TBCR
40.0000 meq | EXTENDED_RELEASE_TABLET | Freq: Once | ORAL | Status: AC
  Administered 2018-10-18: 11:00:00 40 meq via ORAL
  Filled 2018-10-18: qty 2

## 2018-10-18 MED ORDER — DOCUSATE SODIUM 100 MG PO CAPS
100.0000 mg | ORAL_CAPSULE | Freq: Two times a day (BID) | ORAL | Status: DC
  Administered 2018-10-18 – 2018-10-19 (×2): 100 mg via ORAL
  Filled 2018-10-18 (×2): qty 1

## 2018-10-18 MED ORDER — LORAZEPAM 2 MG/ML IJ SOLN: 0.0000 mg | Freq: Four times a day (QID) | INTRAMUSCULAR | Status: DC

## 2018-10-18 MED ORDER — ACETAMINOPHEN 650 MG RE SUPP: 650.0000 mg | Freq: Four times a day (QID) | RECTAL | Status: DC | PRN

## 2018-10-18 MED ORDER — METOCLOPRAMIDE HCL 5 MG/ML IJ SOLN
5.0000 mg | Freq: Once | INTRAMUSCULAR | Status: AC
  Administered 2018-10-18: 05:00:00 5 mg via INTRAVENOUS

## 2018-10-18 MED ORDER — ADULT MULTIVITAMIN W/MINERALS CH
1.0000 | ORAL_TABLET | Freq: Every day | ORAL | Status: DC
  Administered 2018-10-18 – 2018-10-19 (×2): 1 via ORAL
  Filled 2018-10-18 (×2): qty 1

## 2018-10-18 MED ORDER — ONDANSETRON HCL 4 MG/2ML IJ SOLN
4.0000 mg | Freq: Four times a day (QID) | INTRAMUSCULAR | Status: DC | PRN
  Administered 2018-10-18: 13:00:00 4 mg via INTRAVENOUS
  Filled 2018-10-18: qty 2

## 2018-10-18 MED ORDER — ACETAMINOPHEN 325 MG PO TABS: 650.0000 mg | ORAL_TABLET | Freq: Four times a day (QID) | ORAL | Status: DC | PRN

## 2018-10-18 MED ORDER — MAGNESIUM SULFATE 2 GM/50ML IV SOLN
2.0000 g | Freq: Once | INTRAVENOUS | Status: AC
  Administered 2018-10-18: 2 g via INTRAVENOUS
  Filled 2018-10-18: qty 50

## 2018-10-18 MED ORDER — MORPHINE SULFATE (PF) 2 MG/ML IV SOLN
INTRAVENOUS | Status: AC
  Filled 2018-10-18: qty 1

## 2018-10-18 MED ORDER — ONDANSETRON HCL 4 MG PO TABS: 4.0000 mg | ORAL_TABLET | Freq: Four times a day (QID) | ORAL | Status: DC | PRN

## 2018-10-18 MED ORDER — FAMOTIDINE IN NACL 20-0.9 MG/50ML-% IV SOLN
20.0000 mg | Freq: Once | INTRAVENOUS | Status: AC
  Administered 2018-10-18: 05:00:00 20 mg via INTRAVENOUS
  Filled 2018-10-18: qty 50

## 2018-10-18 MED ORDER — FOLIC ACID 1 MG PO TABS
1.0000 mg | ORAL_TABLET | Freq: Every day | ORAL | Status: DC
  Administered 2018-10-18 – 2018-10-19 (×2): 1 mg via ORAL
  Filled 2018-10-18 (×2): qty 1

## 2018-10-18 MED ORDER — ALUM & MAG HYDROXIDE-SIMETH 200-200-20 MG/5ML PO SUSP
30.0000 mL | Freq: Four times a day (QID) | ORAL | Status: DC | PRN
  Administered 2018-10-18: 30 mL via ORAL
  Filled 2018-10-18: qty 30

## 2018-10-18 NOTE — ED Triage Notes (Signed)
Pt arrives via [facility] with c/o emesis since he ran out of his Nexium x 3 days ago. Pt states that he has burning in his chest at this time. Pt is in NAD.

## 2018-10-18 NOTE — ED Notes (Signed)
ED TO INPATIENT HANDOFF REPORT  ED Nurse Name and Phone #: Woody Seller Name/Age/Gender Beryle Lathe 53 y.o. male Room/Bed: ED18A/ED18A  Code Status   Code Status: Prior  Home/SNF/Other Home Patient oriented to: self, place, time and situation Is this baseline? Yes   Triage Complete: Triage complete  Chief Complaint Acid Reflux  Triage Note Pt arrives via [facility] with c/o emesis since he ran out of his Nexium x 3 days ago. Pt states that he has burning in his chest at this time. Pt is in NAD.    Allergies Allergies  Allergen Reactions  . Sulfur Hives    Level of Care/Admitting Diagnosis ED Disposition    ED Disposition Condition Comment   Admit  Hospital Area: [facility] [[id number]]  Level of Care: Med-Surg [16]  Covid Evaluation: N/A  Diagnosis: Hypokalemia [172180]  Admitting Physician: Arnaldo Natal [[id number]]  Attending Physician: Arnaldo Natal [[id number]]  Estimated length of stay: past midnight tomorrow  Certification:: I certify this patient will need inpatient services for at least 2 midnights  PT Class (Do Not Modify): Inpatient [101]  PT Acc Code (Do Not Modify): Private [1]       B Medical/Surgery History Past Medical History:  Diagnosis Date  . 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 COPD (chronic obstructive pulmonary disease) (HCC)   . GERD (gastroesophageal reflux disease)   . History of meningitis   . HIV disease (HCC)    on HAART, follows with ID at [facility]  . Hypertension   . Neck pain    with left arm radiculopathy  . Pancreas cyst   . Pancreatitis    chronic pancreatitis  . TIA (transient ischemic attack)   . Tobacco use    Past Surgical History:  Procedure Laterality Date  . ESOPHAGEAL ATRESIA REPAIR       A IV Location/Drains/Wounds Patient Lines/Drains/Airways Status   Active Line/Drains/Airways    Name:   Placement date:   Placement time:   Site:   Days:   Peripheral IV 10/18/18  Left Antecubital   10/18/18    0434    Antecubital   less than 1          Intake/Output Last 24 hours No intake or output data in the 24 hours ending 10/18/18 0557  Labs/Imaging Results for orders placed or performed during the hospital encounter of 10/18/18 (from the past 48 hour(s))  Lipase, blood     Status: None   Collection Time: 10/18/18  4:35 AM  Result Value Ref Range   Lipase 23 11 - 51 U/L    Comment: Performed at [facility], [address] [place], [place] [address]  Comprehensive metabolic panel     Status: Abnormal   Collection Time: 10/18/18  4:35 AM  Result Value Ref Range   Sodium 140 135 - 145 mmol/L   Potassium 2.2 (LL) 3.5 - 5.1 mmol/L    Comment: CRITICAL RESULT CALLED TO, READ BACK BY AND VERIFIED WITH JENNA Capitola Ladson RN 10/18/2018 @ 0505 RDW    Chloride 99 98 - 111 mmol/L   CO2 26 22 - 32 mmol/L   Glucose, Bld 114 (H) 70 - 99 mg/dL   BUN 11 6 - 20 mg/dL   Creatinine, Ser 1.47 0.61 - 1.24 mg/dL   Calcium 8.7 (L) 8.9 - 10.3 mg/dL   Total Protein 7.5 6.5 - 8.1 g/dL   Albumin 4.3 3.5 - 5.0 g/dL  AST 21 15 - 41 U/L   ALT 13 0 - 44 U/L   Alkaline Phosphatase 103 38 - 126 U/L   Total Bilirubin 0.4 0.3 - 1.2 mg/dL   GFR calc non Af Amer >60 >60 mL/min   GFR calc Af Amer >60 >60 mL/min   Anion gap 15 5 - 15    Comment: Performed at [facility], [address] [place], [place] [address]  CBC     Status: None   Collection Time: 10/18/18  4:35 AM  Result Value Ref Range   WBC 6.8 4.0 - 10.5 K/uL   RBC 4.83 4.22 - 5.81 MIL/uL   Hemoglobin 14.3 13.0 - 17.0 g/dL   HCT 60.4 54.0 - 98.1 %   MCV 86.5 80.0 - 100.0 fL   MCH 29.6 26.0 - 34.0 pg   MCHC 34.2 30.0 - 36.0 g/dL   RDW 19.1 47.8 - 29.5 %   Platelets 395 150 - 400 K/uL   nRBC 0.0 0.0 - 0.2 %    Comment: Performed at [facility], [address], [place], [place] [address]  Ethanol     Status: Abnormal   Collection Time: 10/18/18  4:35 AM  Result Value Ref Range    Alcohol, Ethyl (B) 83 (H) <10 mg/dL    Comment: (NOTE) Lowest detectable limit for serum alcohol is 10 mg/dL. For medical purposes only. Performed at [facility], [address] [place], [place] [address]   Troponin I - Once     Status: None   Collection Time: 10/18/18  4:35 AM  Result Value Ref Range   Troponin I <0.03 <0.03 ng/mL    Comment: Performed at [facility], [address] [place], [place] [address]  Magnesium     Status: Abnormal   Collection Time: 10/18/18  4:35 AM  Result Value Ref Range   Magnesium 1.6 (L) 1.7 - 2.4 mg/dL    Comment: Performed at [facility], [address] [place], [place] [address]   No results found.  Pending Labs Unresulted Labs (From admission, onward)    Start     Ordered   10/18/18 0433  Urinalysis, Complete w Microscopic  ONCE - STAT,   STAT     10/18/18 0433   Signed and Held  Creatinine, serum  (enoxaparin (LOVENOX)    CrCl >/= 30 ml/min)  Weekly,   R    Comments:  while on enoxaparin therapy    Signed and Held   Signed and Held  TSH  Add-on,   R     Signed and Held          Vitals/Pain Today's Vitals   10/18/18 0500 10/18/18 0523 10/18/18 0527 10/18/18 0545  BP: 127/81  127/81   Pulse: 80  78   Resp: (!) 23     Temp:      TempSrc:      SpO2: 95%     Weight:      Height:      PainSc:  9   0-No pain    Isolation Precautions No active isolations  Medications Medications  sodium chloride 0.45 % 1,000 mL with potassium chloride 40 mEq infusion ( Intravenous New Bag/Given 10/18/18 0547)  LORazepam (ATIVAN) injection 0-4 mg ( Intravenous Not Given 10/18/18 0529)  thiamine (B-1) injection 100 mg (has no administration in time range)  magnesium sulfate IVPB 2 g 50 mL (2 g Intravenous New Bag/Given 10/18/18 0552)  famotidine (PEPCID) IVPB  20 mg premix (0 mg Intravenous Stopped 10/18/18 0509)  sodium chloride 0.9 % bolus 1,000 mL (0 mLs Intravenous Stopped 10/18/18 0552)  ondansetron (ZOFRAN)  injection 4 mg (4 mg Intravenous Given 10/18/18 0439)  metoCLOPramide (REGLAN) injection 5 mg (5 mg Intravenous Given 10/18/18 0519)  morphine 2 MG/ML injection 2 mg (2 mg Intravenous Given 10/18/18 0525)    Mobility walks Low fall risk   Focused Assessments Cardiac Assessment Handoff:  Cardiac Rhythm: Normal sinus rhythm Lab Results  Component Value Date   TROPONINI <0.03 10/18/2018   No results found for: DDIMER Does the Patient currently have chest pain? Yes     R Recommendations: See Admitting Provider Note  Report given to:   Additional Notes:

## 2018-10-18 NOTE — Progress Notes (Signed)
[facility] [place] [facility]     PATIENT NAME: Mason Martin    MR#:  [id number]  DATE OF BIRTH:  01/20/1966  SUBJECTIVE:   Patient presents to the hospital due to intractable nausea vomiting and noted to have some coffee-ground emesis.  Also noted to be severely hypokalemic.  REVIEW OF SYSTEMS:    Review of Systems  Constitutional: Negative for chills and fever.  HENT: Negative for congestion and tinnitus.   Eyes: Negative for blurred vision and double vision.  Respiratory: Negative for cough, shortness of breath and wheezing.   Cardiovascular: Negative for chest pain, orthopnea and PND.  Gastrointestinal: Positive for abdominal pain, nausea and vomiting. Negative for diarrhea.  Genitourinary: Negative for dysuria and hematuria.  Neurological: Negative for dizziness, sensory change and focal weakness.  All other systems reviewed and are negative.   Nutrition: Full liquid Tolerating Diet: Yes Tolerating PT: Await Eval.   DRUG ALLERGIES:   Allergies  Allergen Reactions  . Sulfur Hives    VITALS:  Blood pressure 115/85, pulse 77, temperature 98.2 F (36.8 C), temperature source Oral, resp. rate 16, height 5\' 4"  (1.626 m), weight 65.8 kg, SpO2 97 %.  PHYSICAL EXAMINATION:   Physical Exam  GENERAL:  53 y.o.-year-old patient lying in bed in no acute distress.  EYES: Pupils equal, round, reactive to light and accommodation. No scleral icterus. Extraocular muscles intact.  HEENT: Head atraumatic, normocephalic. Oropharynx and nasopharynx clear.  NECK:  Supple, no jugular venous distention. No thyroid enlargement, no tenderness.  LUNGS: Normal breath sounds bilaterally, no wheezing, rales, rhonchi. No use of accessory muscles of respiration.  CARDIOVASCULAR: S1, S2 normal. No murmurs, rubs, or gallops.  ABDOMEN: Soft, Tender in the epigastric area, nondistended. Bowel sounds present. No organomegaly or mass.  EXTREMITIES: No cyanosis, clubbing or  edema b/l.    NEUROLOGIC: Cranial nerves II through XII are intact. No focal Motor or sensory deficits b/l.   PSYCHIATRIC: The patient is alert and oriented x 3.  SKIN: No obvious rash, lesion, or ulcer.    LABORATORY PANEL:   CBC Recent Labs  Lab 10/18/18 0435  WBC 6.8  HGB 14.3  HCT 41.8  PLT 395   ------------------------------------------------------------------------------------------------------------------  Chemistries  Recent Labs  Lab 10/18/18 0435 10/18/18 1144  NA 140  --   K 2.2* 3.3*  CL 99  --   CO2 26  --   GLUCOSE 114*  --   BUN 11  --   CREATININE 0.72  --   CALCIUM 8.7*  --   MG 1.6*  --   AST 21  --   ALT 13  --   ALKPHOS 103  --   BILITOT 0.4  --    ------------------------------------------------------------------------------------------------------------------  Cardiac Enzymes Recent Labs  Lab 10/18/18 0435  TROPONINI <0.03   ------------------------------------------------------------------------------------------------------------------  RADIOLOGY:  Dg Chest Port 1 View  Result Date: 10/18/2018 CLINICAL DATA:  Burning in chest, emesis x3 days. Hx of COPD, HTN. Smoker.chest burning EXAM: PORTABLE CHEST 1 VIEW COMPARISON:  Radiograph 08/28/2018 FINDINGS: Normal cardiac silhouette. There is increased linear markings in LEFT and RIGHT lung base. Upper lungs clear. No osseous abnormality. IMPRESSION: Increased linear bibasilar opacities with differential including bronchitis, atelectasis or pneumonia Electronically Signed   By: Genevive BiStewart  Edmunds M.D.   On: 10/18/2018 06:02     ASSESSMENT AND PLAN:   53 year old male with past medical history of HIV, GERD, COPD, previous history of pancreatitis, tobacco abuse, previous history of TIA who presents  to the hospital due to intractable nausea vomiting and also noted to be hypokalemic.  1.  Intractable nausea and vomiting-suspected to be secondary to gastritis.  Patient also had some coffee-ground  emesis and therefore some concern for GI bleed.  Seen by gastroenterology and hemoglobin currently stable and no plans for urgent endoscopic evaluation. -Continue IV fluids, antiemetics -Continue IV PPI, Maalox and supportive care for now.  On full liquid diet.  2.  Hypokalemia/hypomagnesemia-secondary to the intractable nausea vomiting. - We will supplement and its improving and will continue to monitor.  3.  Alcohol abuse- continue thiamine, folate. -Continue CIWA protocol.     All the records are reviewed and case discussed with Care Management/Social Worker. Management plans discussed with the patient, family and they are in agreement.  CODE STATUS: Full code  DVT Prophylaxis: Lovenox  TOTAL TIME TAKING CARE OF THIS PATIENT: 30 minutes.   POSSIBLE D/C IN 1-2 DAYS, DEPENDING ON CLINICAL CONDITION.   Houston Siren M.D on 10/18/2018 at 1:27 PM  Between 7am to 6pm - Pager - [phone]  After 6pm go to [email or web address] - Social research officer, government  [facility] [place] [facility]  Office  [phone]  CC: Primary care physician; System, Pcp Not In

## 2018-10-18 NOTE — Consult Note (Signed)
Cephas Darby, MD [address]  [place]  [place], [place] [address]  Main: [phone]  Fax: [phone] Pager: [phone]   Consultation  Referring Provider:     No ref. provider found Primary Care Physician:  System, [place] Not In Primary Gastroenterologist: [facility]        Reason for Consultation:     Epigastric pain, coffee-ground emesis  Date of Admission:  10/18/2018 Date of Consultation:  10/18/2018         HPI:   Mason Martin is a 53 y.o. male with history of alcohol use, history of HIV currently on ART followed by ID at [facility] presented early this morning secondary to intractable nausea, vomiting, coffee-ground emesis, epigastric pain.  Patient reports that he had 3-4 beers as well as 2 glasses of liquor last night around 8 PM along with his dinner.  He woke up early this morning with nausea, initially he threw up food followed by coffee-ground material.  Patient states he ran out of Nexium about 3 days ago.  He also reports he has severe burning in his chest as well as pain in his epigastrium.  He is found to be severely hypokalemic.  He is therefore admitted for further management.  GI is consulted to evaluate for coffee-ground emesis.  On admission, patient does not have AKI, hemoglobin 14, normal lipase, LFTs normal, serum alcohol was elevated.  He does not have chronic liver disease, cirrhosis.  He had history of alcoholic pancreatitis, developed pseudocyst which resolved based on MRI/MRCP in 07/2017 at [facility] Patient reports that he gets his Nexium via mail order pharmacy and he tried to call them, he did not receive his medication yet Has been tolerating full liquid diet, had one episode of black stool today, reports nausea and epigastric pain  NSAIDs: Meloxicam as needed  Antiplts/Anticoagulants/Anti thrombotics: None  GI Procedures: EGD at [facility] 07/2017 for upper abdominal pain - Z-line regular, 39 cm from the incisors.  -  Non-bleeding erosive gastropathy. Biopsied.  - No gross lesions in the entire examined duodenum. A.  Stomach, biopsy - Oxyntic mucosa with PPI effect - No Helicobacter or inflammatory process identified  Colonoscopy 2016   Impression:- Preparation of the colon was fair.  - Fibrous solids in the entire examined colon.  - Two 1 to 2 mm polyps in the descending colon and in the   ascending colon, removed with a cold biopsy forceps.   Resected and retrieved.  - Five 4 to 5 mm polyps in the sigmoid colon, in the   descending colon, in the transverse colon and in the   ascending colon, removed with a cold snare. Resected and   retrieved.  - Internal hemorrhoids.  - The examined portion of the ileum was normal. Diagnosis: A: Colon, cecum and ascending, biopsy -Adenomatous polyp (multiple fragments), no high grade dysplasia identified   B: Colon, descending, biopsy  -Adenomatous polyp (1 fragment)  Past Medical History:  Diagnosis Date  . 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 COPD (chronic obstructive pulmonary disease) ([place])   . GERD (gastroesophageal reflux disease)   . History of meningitis   . HIV disease ([place])    on HAART, follows with ID at [facility]  . Hypertension   . Neck pain    with left arm radiculopathy  . Pancreas cyst   . Pancreatitis    chronic pancreatitis  . TIA (transient ischemic attack)   .  Tobacco use     Past Surgical History:  Procedure Laterality Date  . ESOPHAGEAL ATRESIA REPAIR      Prior to Admission medications   Medication Sig Start Date End Date Taking? Authorizing Provider  amLODipine (NORVASC) 5 MG tablet Take 5 mg by mouth daily.   Yes [provider]   cetirizine (ZYRTEC) 10 MG tablet Take 10 mg by mouth daily. 04/27/15  Yes [provider]  CREON 24000 units CPEP Take 24,000 Units by mouth 2 (two) times daily with a meal.  06/22/15  Yes [provider]  enalapril (VASOTEC) 20 MG tablet Take 20 mg by mouth daily. 06/22/15  Yes [provider]  gabapentin (NEURONTIN) 300 MG capsule Take 300 mg by mouth 2 (two) times daily.   Yes [provider]  NEXIUM 40 MG capsule Take 40 mg by mouth daily before breakfast. 06/22/15  Yes [provider]  PREZCOBIX 800-150 MG tablet Take 1 tablet by mouth daily.   Yes [provider]  TIVICAY 50 MG tablet Take 50 mg by mouth daily. 06/22/15  Yes [provider]  triamcinolone ointment (KENALOG) 0.5 % Apply 1 application topically 2 (two) times daily. 01/28/18  Yes Laban Emperor, PA-C  VENTOLIN HFA 108 (90 Base) MCG/ACT inhaler Inhale 2 puffs into the lungs 4 (four) times daily as needed. For shortness of breath and/or wheezing. 06/22/15  Yes [provider]  azithromycin (ZITHROMAX) 250 MG tablet Take 2 tabs today, then 1 tab daily x 4 days Patient not taking: Reported on 10/18/2018 08/28/18   Jearld Fenton, NP  brompheniramine-pseudoephedrine-DM 30-2-10 MG/5ML syrup Take 5 mLs by mouth 4 (four) times daily as needed. Patient not taking: Reported on 10/18/2018 08/28/18   Jearld Fenton, NP  cyclobenzaprine (FLEXERIL) 10 MG tablet Take 1 tablet (10 mg total) by mouth 3 (three) times daily as needed. Patient not taking: Reported on 10/18/2018 04/22/17   Sable Feil, PA-C  meloxicam (MOBIC) 15 MG tablet Take 1 tablet (15 mg total) by mouth daily. Patient not taking: Reported on 10/18/2018 04/22/17   Sable Feil, PA-C  tazarotene [facility]) 0.05 % cream Apply topically at bedtime. Patient not taking: Reported on 10/18/2018 01/28/18   Laban Emperor, PA-C   Current Facility-Administered Medications:  .  0.9 % NaCl with KCl 40 mEq / L   infusion, , Intravenous, Continuous, Harrie Foreman, MD, Last Rate: 125 mL/hr at 10/18/18 0733, 125 mL/hr at 10/18/18 0733 .  acetaminophen (TYLENOL) tablet 650 mg, 650 mg, Oral, Q6H PRN **OR** acetaminophen (TYLENOL) suppository 650 mg, 650 mg, Rectal, Q6H PRN, Harrie Foreman, MD .  alum & mag hydroxide-simeth (MAALOX/MYLANTA) 200-200-20 MG/5ML suspension 30 mL, 30 mL, Oral, Q6H PRN, Henreitta Leber, MD, 30 mL at 10/18/18 1039 .  docusate sodium (COLACE) capsule 100 mg, 100 mg, Oral, BID, Harrie Foreman, MD, 100 mg at 10/18/18 1003 .  enoxaparin (LOVENOX) injection 40 mg, 40 mg, Subcutaneous, Q24H, Sainani, Vivek J, MD .  folic acid (FOLVITE) tablet 1 mg, 1 mg, Oral, Daily, Harrie Foreman, MD, 1 mg at 10/18/18 1003 .  LORazepam (ATIVAN) injection 0-4 mg, 0-4 mg, Intravenous, Q6H **FOLLOWED BY** [START ON 10/20/2018] LORazepam (ATIVAN) injection 0-4 mg, 0-4 mg, Intravenous, Q12H, Harrie Foreman, MD .  LORazepam (ATIVAN) tablet 1 mg, 1 mg, Oral, Q6H PRN **OR** LORazepam (ATIVAN) injection 1 mg, 1 mg, Intravenous, Q6H PRN, Harrie Foreman, MD .  morphine 2 MG/ML injection 2 mg, 2 mg,  Intravenous, Q4H PRN, Henreitta Leber, MD, 2 mg at 10/18/18 1343 .  multivitamin with minerals tablet 1 tablet, 1 tablet, Oral, Daily, Harrie Foreman, MD, 1 tablet at 10/18/18 1003 .  ondansetron (ZOFRAN) tablet 4 mg, 4 mg, Oral, Q6H PRN **OR** ondansetron (ZOFRAN) injection 4 mg, 4 mg, Intravenous, Q6H PRN, Harrie Foreman, MD, 4 mg at 10/18/18 1327 .  pantoprazole (PROTONIX) injection 40 mg, 40 mg, Intravenous, BID, Senta Kantor, Tally Due, MD, 40 mg at 10/18/18 1343 .  thiamine (VITAMIN B-1) tablet 100 mg, 100 mg, Oral, Daily, 100 mg at 10/18/18 1003 **OR** thiamine (B-1) injection 100 mg, 100 mg, Intravenous, Daily, Harrie Foreman, MD .  triamcinolone ointment (KENALOG) 0.5 %, , Topical, BID, Sainani, Belia Heman, MD   Family History  Problem Relation Age of Onset  . Heart failure Father    . Emphysema Father      Social History   Tobacco Use  . Smoking status: Current Every Day Smoker    Packs/day: 0.25    Types: Cigarettes  . Smokeless tobacco: Never Used  Substance Use Topics  . Alcohol use: Yes    Alcohol/week: 0.0 standard drinks  . Drug use: No    Allergies as of 10/18/2018 - Review Complete 10/18/2018  Allergen Reaction Noted  . Sulfur Hives 02/09/2015    Review of Systems:    All systems reviewed and negative except where noted in HPI.   Physical Exam:  Vital signs in last 24 hours: Temp:  [97.9 F (36.6 C)-98.6 F (37 C)] 98.2 F (36.8 C) (04/19 1229) Pulse Rate:  [77-89] 77 (04/19 1229) Resp:  [16-24] 16 (04/19 1229) BP: (115-143)/(72-94) 115/85 (04/19 1229) SpO2:  [91 %-99 %] 97 % (04/19 1229) Weight:  [65.8 kg] 65.8 kg (04/19 0432) Last BM Date: 10/17/18 General:   Pleasant, cooperative in NAD Head:  Normocephalic and atraumatic. Eyes:   No icterus.   Conjunctiva pink. PERRLA. Ears:  Normal auditory acuity. Neck:  Supple; no masses or thyroidomegaly Lungs: Respirations even and unlabored. Lungs clear to auscultation bilaterally.   No wheezes, crackles, or rhonchi.  Heart:  Regular rate and rhythm;  Without murmur, clicks, rubs or gallops Abdomen:  Soft, nondistended, mild epigastric tenderness. Normal bowel sounds. No appreciable masses or hepatomegaly.  No rebound or guarding.  Rectal:  Not performed. Msk:  Symmetrical without gross deformities.  Strength normal Extremities:  Without edema, cyanosis or clubbing. Neurologic:  Alert and oriented x3;  grossly normal neurologically. Skin: Dry scaly skin, intact without significant lesions or rashes. Cervical Nodes:  No significant cervical adenopathy. Psych:  Alert and cooperative. Normal affect.  LAB RESULTS: CBC Latest Ref Rng & Units 10/18/2018 06/26/2017 05/28/2017  WBC 4.0 - 10.5 K/uL 6.8 6.0 5.3  Hemoglobin 13.0 - 17.0 g/dL 14.3 12.4(L) 11.3(L)  Hematocrit 39.0 - 52.0 % 41.8 37.6(L)  34.2(L)  Platelets 150 - 400 K/uL 395 322 307    BMET BMP Latest Ref Rng & Units 10/18/2018 10/18/2018 06/26/2017  Glucose 70 - 99 mg/dL - 114(H) 106(H)  BUN 6 - 20 mg/dL - 11 14  Creatinine 0.61 - 1.24 mg/dL - 0.72 0.85  Sodium 135 - 145 mmol/L - 140 141  Potassium 3.5 - 5.1 mmol/L 3.3(L) 2.2(LL) 3.0(L)  Chloride 98 - 111 mmol/L - 99 109  CO2 22 - 32 mmol/L - 26 24  Calcium 8.9 - 10.3 mg/dL - 8.7(L) 8.3(L)    LFT Hepatic Function Latest Ref Rng & Units 10/18/2018 06/26/2017 05/28/2017  Total Protein 6.5 - 8.1 g/dL 7.5 7.0 6.1(L)  Albumin 3.5 - 5.0 g/dL 4.3 4.0 3.1(L)  AST 15 - 41 U/L _0 ALT 0 - 44 U/L 13 12(L) 10(L)  Alk Phosphatase 38 - 126 U/L 103 89 87  Total Bilirubin 0.3 - 1.2 mg/dL 0.4 0.5 0.8     STUDIES: Dg Chest Port 1 View  Result Date: 10/18/2018 CLINICAL DATA:  Burning in chest, emesis x3 days. Hx of COPD, HTN. Smoker.chest burning EXAM: PORTABLE CHEST 1 VIEW COMPARISON:  Radiograph 08/28/2018 FINDINGS: Normal cardiac silhouette. There is increased linear markings in LEFT and RIGHT lung base. Upper lungs clear. No osseous abnormality. IMPRESSION: Increased linear bibasilar opacities with differential including bronchitis, atelectasis or pneumonia Electronically Signed   By: Suzy Bouchard M.D.   On: 10/18/2018 06:02      Impression / Plan:   Mason Martin is a 53 y.o. black male with HIV, tobacco use, alcohol use presents with 1 day history of epigastric pain, associated with nausea, nonbloody emesis followed by coffee-ground emesis after drinking alcohol.  No evidence of acute pancreatitis  Coffee-ground emesis Hemodynamically insignificant upper GI bleed Mallory-Weiss tear or alcohol induced erosive esophagitis or erosive gastritis He does not have cirrhosis Start Protonix 40 mg IV twice daily CBC every 12 hourly Antiemetics and pain control Long-term PPI as outpatient Continue full liquid diet Defer upper endoscopy at this time unless patient  demonstrates signs and symptoms of ongoing upper GI bleed Abstinence from alcohol use Follow-up with [facility] gastroenterology after discharge   Thank you for involving me in the care of this patient.  Will follow along with you    LOS: 0 days   Sherri Sear, MD  10/18/2018, 2:18 PM   Note: This dictation was prepared with Dragon dictation along with smaller phrase technology. Any transcriptional errors that result from this process are unintentional.

## 2018-10-18 NOTE — H&P (Signed)
Mason Martin is an 53 y.o. male.   Chief Complaint: Vomiting HPI: The patient with past medical history of GERD, hypertension, HIV, and COPD presents to the emergency department complaining of vomiting.  The patient reports that he has been out of his acid reflux medication for 2 days and has developed persistent nausea and vomiting.  He denies seeing blood in his emesis however in the emergency department the patient had one episode of coffee-ground emesis.  The patient admits to drinking alcohol daily.  Laboratory evaluation was significant for hypokalemia.  He was given oral potassium replacement which she subsequently vomited prior to the emergency department staff called the hospitalist service for admission.  Past Medical History:  Diagnosis Date  . 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 COPD (chronic obstructive pulmonary disease) ([facility])   . GERD (gastroesophageal reflux disease)   . History of meningitis   . HIV disease ([facility])    on HAART, [facility]  . Hypertension   . Neck pain    with left arm radiculopathy  . Pancreas cyst   . Pancreatitis    chronic pancreatitis  . TIA (transient ischemic attack)   . Tobacco use     Past Surgical History:  Procedure Laterality Date  . ESOPHAGEAL ATRESIA REPAIR      Family History  Problem Relation Age of Onset  . Heart failure Father   . Emphysema Father    Social History:  reports that he has been smoking cigarettes. He has been smoking about 0.25 packs per day. He has never used smokeless tobacco. He reports current alcohol use. He reports that he does not use drugs.  Allergies:  Allergies  Allergen Reactions  . Sulfur Hives    Medications Prior to Admission  Medication Sig Dispense Refill  . NEXIUM 40 MG capsule Take 40 mg by mouth daily before breakfast.  11  . azithromycin (ZITHROMAX) 250 MG tablet Take 2 tabs today, then 1 tab daily x 4 days (Patient not taking: Reported on 10/18/2018) 6  tablet 0  . brompheniramine-pseudoephedrine-DM 30-2-10 MG/5ML syrup Take 5 mLs by mouth 4 (four) times daily as needed. 120 mL 0  . cetirizine (ZYRTEC) 10 MG tablet Take 10 mg by mouth daily.  9  . CREON 24000 units CPEP Take 24,000 Units by mouth 3 (three) times daily with meals.  5  . cyclobenzaprine (FLEXERIL) 10 MG tablet Take 1 tablet (10 mg total) by mouth 3 (three) times daily as needed. 15 tablet 0  . enalapril (VASOTEC) 20 MG tablet Take 20 mg by mouth daily.  8  . gabapentin (NEURONTIN) 300 MG capsule Take 300 mg by mouth 2 (two) times daily.    . meloxicam (MOBIC) 15 MG tablet Take 1 tablet (15 mg total) by mouth daily. 30 tablet 0  . PREZCOBIX 800-150 MG tablet Take 1 tablet by mouth daily.    . tazarotene (TAZORAC) 0.05 % cream Apply topically at bedtime. 30 g 0  . TIVICAY 50 MG tablet Take 50 mg by mouth daily.  11  . triamcinolone ointment (KENALOG) 0.5 % Apply 1 application topically 2 (two) times daily. 30 g 0  . VENTOLIN HFA 108 (90 Base) MCG/ACT inhaler Inhale 2 puffs into the lungs 4 (four) times daily as needed. For shortness of breath and/or wheezing.  5    Results for orders placed or performed during the hospital encounter of 10/18/18 (from the past 48 hour(s))  Lipase, blood     Status: None   Collection Time: 10/18/18  4:35 AM  Result Value Ref Range   Lipase 23 11 - 51 U/L    Comment: Performed at [facility], [address] [place], [place] [address]  Comprehensive metabolic panel     Status: Abnormal   Collection Time: 10/18/18  4:35 AM  Result Value Ref Range   Sodium 140 135 - 145 mmol/L   Potassium 2.2 (LL) 3.5 - 5.1 mmol/L    Comment: CRITICAL RESULT CALLED TO, READ BACK BY AND VERIFIED WITH JENNA ELLINGTON RN 10/18/2018 @ 0505 RDW    Chloride 99 98 - 111 mmol/L   CO2 26 22 - 32 mmol/L   Glucose, Bld 114 (H) 70 - 99 mg/dL   BUN 11 6 - 20 mg/dL   Creatinine, Ser 6.04 0.61 - 1.24 mg/dL   Calcium 8.7 (L) 8.9 - 10.3 mg/dL   Total Protein 7.5  6.5 - 8.1 g/dL   Albumin 4.3 3.5 - 5.0 g/dL   AST 21 15 - 41 U/L   ALT 13 0 - 44 U/L   Alkaline Phosphatase 103 38 - 126 U/L   Total Bilirubin 0.4 0.3 - 1.2 mg/dL   GFR calc non Af Amer >60 >60 mL/min   GFR calc Af Amer >60 >60 mL/min   Anion gap 15 5 - 15    Comment: Performed at [facility], [address] [place], [place] [address]  CBC     Status: None   Collection Time: 10/18/18  4:35 AM  Result Value Ref Range   WBC 6.8 4.0 - 10.5 K/uL   RBC 4.83 4.22 - 5.81 MIL/uL   Hemoglobin 14.3 13.0 - 17.0 g/dL   HCT 11.9 14.7 - 82.9 %   MCV 86.5 80.0 - 100.0 fL   MCH 29.6 26.0 - 34.0 pg   MCHC 34.2 30.0 - 36.0 g/dL   RDW 56.2 13.0 - 86.5 %   Platelets 395 150 - 400 K/uL   nRBC 0.0 0.0 - 0.2 %    Comment: Performed at [facility], [address], [place], [place] [address]  Ethanol     Status: Abnormal   Collection Time: 10/18/18  4:35 AM  Result Value Ref Range   Alcohol, Ethyl (B) 83 (H) <10 mg/dL    Comment: (NOTE) Lowest detectable limit for serum alcohol is 10 mg/dL. For medical purposes only. Performed at [facility], [address] [place], [place] [address]   Troponin I - Once     Status: None   Collection Time: 10/18/18  4:35 AM  Result Value Ref Range   Troponin I <0.03 <0.03 ng/mL    Comment: Performed at [facility], [address] [place], [place] [address]  Magnesium     Status: Abnormal   Collection Time: 10/18/18  4:35 AM  Result Value Ref Range   Magnesium 1.6 (L) 1.7 - 2.4 mg/dL    Comment: Performed at [facility], [address] [place], [place] [address]   No results found.  Review of Systems  Constitutional: Negative for chills and fever.  HENT: Negative for sore throat and tinnitus.   Eyes: Negative for blurred vision and redness.  Respiratory: Negative for cough and shortness of breath.   Cardiovascular: Negative for chest pain, palpitations, orthopnea and PND.  Gastrointestinal: Positive  for abdominal pain, nausea and vomiting. Negative for diarrhea.  Genitourinary: Negative for dysuria, frequency and urgency.  Musculoskeletal: Negative for joint pain and myalgias.  Skin: Negative for rash.       No lesions  Neurological: Negative for speech change, focal weakness and weakness.  Endo/Heme/Allergies: Does not bruise/bleed easily.       No temperature intolerance  Psychiatric/Behavioral: Negative for depression and suicidal ideas.    Blood pressure 127/81, pulse 78, temperature 98.6 F (37 C), temperature source Oral, resp. rate (!) 23, height  (1.626 m), weight 65.8 kg, SpO2 95 %. Physical Exam  Vitals reviewed. Constitutional: He is oriented to person, place, and time. He appears well-developed and well-nourished. No distress.  HENT:  Head: Normocephalic and atraumatic.  Mouth/Throat: Oropharynx is clear and moist.  Eyes: Pupils are equal, round, and reactive to light. Conjunctivae and EOM are normal. No scleral icterus.  Neck: Normal range of motion. Neck supple. No JVD present. No tracheal deviation present. No thyromegaly present.  Cardiovascular: Normal rate, regular rhythm and normal heart sounds. Exam reveals no gallop and no friction rub.  No murmur heard. Respiratory: Effort normal and breath sounds normal. No respiratory distress.  GI: Soft. Bowel sounds are normal. He exhibits no distension. There is no abdominal tenderness.  Genitourinary:    Genitourinary Comments: Deferred   Musculoskeletal: Normal range of motion.        General: No edema.  Lymphadenopathy:    He has no cervical adenopathy.  Neurological: He is alert and oriented to person, place, and time. No cranial nerve deficit.  Skin: Skin is warm and dry. No rash noted. No erythema.  Psychiatric: He has a normal mood and affect. His behavior is normal. Judgment and thought content normal.     Assessment/Plan This is a 53 year old male admitted for hypokalemia. 1.  Hypokalemia: Currently  n.p.o. except for sips with meds.  Manage severe nausea and vomiting with Zofran and/or Compazine for refractory emesis.  Replete potassium and monitor telemetry. 2.  Hematemesis: Secondary to gastritis.  Consult gastroenterology.  The patient is currently stable and maintain 2 large-bore IVs 3.  HIV positive: Continue antiretroviral therapy once medications reconciled.  The patient does not have pancreatitis 4.  Alcohol abuse: CIWA scale 5.  DVT prophylaxis: SCDs 6.  GI prophylaxis: PPI (H2 blocker if the former is unavailable from pharmacy) The patient is a full code.  Time spent on admission orders and patient care approximately 45 minutes  Arnaldo Natal, MD 10/18/2018, 6:01 AM

## 2018-10-18 NOTE — ED Notes (Signed)
Patient called out with call bell. RN to bedside. Patient c/o emesis. Approximately  200 mL brown, coffee/ground appearing emesis seen in emesis bag. Patient given new emesis bag. MD informed.

## 2018-10-18 NOTE — ED Provider Notes (Signed)
[facility] Emergency Department Provider Note   ____________________________________________   First MD Initiated Contact with Patient 10/18/18 [phone]     (approximate)  I have reviewed the triage vital signs and the nursing notes.   HISTORY  Chief Complaint Emesis    HPI Mason Martin is a 53 y.o. male brought to the ED from home via EMS with a chief complaint of epigastric burning and vomiting.  Patient reports he ran of his Nexium 3 days ago.  Also admits to drinking beer and liquor.  Complains of epigastric burning and nausea/vomiting.  Denies associated fever, cough, chest pain, shortness of breath, diarrhea.  Denies recent travel, trauma or exposure to persons diagnosed with coronavirus.       Past Medical History:  Diagnosis Date  . 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 COPD (chronic obstructive pulmonary disease) (HCC)   . GERD (gastroesophageal reflux disease)   . History of meningitis   . HIV disease (HCC)    on HAART, [facility]  . Hypertension   . Neck pain    with left arm radiculopathy  . Pancreas cyst   . Pancreatitis    chronic pancreatitis  . TIA (transient ischemic attack)   . Tobacco use     Patient Active Problem List   Diagnosis Date Noted  . HTN (hypertension) 05/27/2017  . GERD (gastroesophageal reflux disease) 05/27/2017  . COPD (chronic obstructive pulmonary disease) (HCC) 05/27/2017  . HIV (human immunodeficiency virus infection) (HCC) 05/27/2017  . Hypokalemia 05/27/2017  . Acute recurrent pancreatitis 07/01/2015    Past Surgical History:  Procedure Laterality Date  . ESOPHAGEAL ATRESIA REPAIR      Prior to Admission medications   Medication Sig Start Date End Date Taking? Authorizing Provider  NEXIUM 40 MG capsule Take 40 mg by mouth daily before breakfast. 06/22/15  Yes [provider]  azithromycin (ZITHROMAX) 250 MG tablet Take 2 tabs today, then 1 tab daily x 4  days Patient not taking: Reported on 10/18/2018 08/28/18   Lorre MunroeBaity, Regina W, NP  brompheniramine-pseudoephedrine-DM 30-2-10 MG/5ML syrup Take 5 mLs by mouth 4 (four) times daily as needed. 08/28/18   Lorre MunroeBaity, Regina W, NP  cetirizine (ZYRTEC) 10 MG tablet Take 10 mg by mouth daily. 04/27/15   [provider]  CREON 24000 units CPEP Take 24,000 Units by mouth 3 (three) times daily with meals. 06/22/15   [provider]  cyclobenzaprine (FLEXERIL) 10 MG tablet Take 1 tablet (10 mg total) by mouth 3 (three) times daily as needed. 04/22/17   Joni ReiningSmith, Ronald K, PA-C  enalapril (VASOTEC) 20 MG tablet Take 20 mg by mouth daily. 06/22/15   [provider]  gabapentin (NEURONTIN) 300 MG capsule Take 300 mg by mouth 2 (two) times daily.    [provider]  meloxicam (MOBIC) 15 MG tablet Take 1 tablet (15 mg total) by mouth daily. 04/22/17   Joni ReiningSmith, Ronald K, PA-C  PREZCOBIX 800-150 MG tablet Take 1 tablet by mouth daily.    [provider]  tazarotene (TAZORAC) 0.05 % cream Apply topically at bedtime. 01/28/18   Enid DerryWagner, Ashley, PA-C  TIVICAY 50 MG tablet Take 50 mg by mouth daily. 06/22/15   [provider]  triamcinolone ointment (KENALOG) 0.5 % Apply 1 application topically 2 (two) times daily. 01/28/18   Enid DerryWagner, Ashley, PA-C  VENTOLIN HFA 108 (90 Base) MCG/ACT inhaler Inhale 2 puffs into the lungs 4 (four)  times daily as needed. For shortness of breath and/or wheezing. 06/22/15   [provider]    Allergies Sulfur  Family History  Problem Relation Age of Onset  . Heart failure Father   . Emphysema Father     Social History Social History   Tobacco Use  . Smoking status: Current Every Day Smoker    Packs/day: 0.25    Types: Cigarettes  . Smokeless tobacco: Never Used  Substance Use Topics  . Alcohol use: Yes    Alcohol/week: 0.0 standard drinks  . Drug use: No    Review of Systems  Constitutional: No fever/chills Eyes: No visual  changes. ENT: No sore throat. Cardiovascular: Denies chest pain. Respiratory: Denies shortness of breath. Gastrointestinal: Positive for abdominal pain, nausea and vomiting.  No diarrhea.  No constipation. Genitourinary: Negative for dysuria. Musculoskeletal: Negative for back pain. Skin: Negative for rash. Neurological: Negative for headaches, focal weakness or numbness.   ____________________________________________   PHYSICAL EXAM:  VITAL SIGNS: ED Triage Vitals  Enc Vitals Group     BP 10/18/18 0431 (!) 143/94     Pulse Rate 10/18/18 0431 81     Resp 10/18/18 0431 17     Temp 10/18/18 0431 98.6 F (37 C)     Temp Source 10/18/18 0431 Oral     SpO2 10/18/18 0431 99 %     Weight 10/18/18 0432 145 lb (65.8 kg)     Height 10/18/18 0432  (1.626 m)     Head Circumference --      Peak Flow --      Pain Score 10/18/18 0432 10     Pain Loc --      Pain Edu? --      Excl. in GC? --     Constitutional: Alert and oriented. Cachetic appearing and in mild acute distress. Eyes: Conjunctivae are normal. PERRL. EOMI. Head: Atraumatic. Nose: No congestion/rhinnorhea. Mouth/Throat: Mucous membranes are moist.  Oropharynx non-erythematous. Neck: No stridor.   Cardiovascular: Normal rate, regular rhythm. Grossly normal heart sounds.  Good peripheral circulation. Respiratory: Normal respiratory effort.  No retractions. Lungs CTAB. Gastrointestinal: Soft and mildly tender to palpation epigastrium without rebound or guarding. No distention. No abdominal bruits. No CVA tenderness. Musculoskeletal: No lower extremity tenderness nor edema.  No joint effusions. Neurologic:  Normal speech and language. No gross focal neurologic deficits are appreciated.  Skin:  Skin is warm, dry and intact. No rash noted. Psychiatric: Mood and affect are normal. Speech and behavior are normal.  ____________________________________________   LABS (all labs ordered are listed, but only abnormal results  are displayed)  Labs Reviewed  COMPREHENSIVE METABOLIC PANEL - Abnormal; Notable for the following components:      Result Value   Potassium 2.2 (*)    Glucose, Bld 114 (*)    Calcium 8.7 (*)    All other components within normal limits  ETHANOL - Abnormal; Notable for the following components:   Alcohol, Ethyl (B) 83 (*)    All other components within normal limits  MAGNESIUM - Abnormal; Notable for the following components:   Magnesium 1.6 (*)    All other components within normal limits  LIPASE, BLOOD  CBC  TROPONIN I  URINALYSIS, COMPLETE (UACMP) WITH MICROSCOPIC  TSH   ____________________________________________  EKG  ED ECG REPORT I, Amiri Riechers J, the attending physician, personally viewed and interpreted this ECG.   Date: 10/18/2018  EKG Time: 0436  Rate: 82  Rhythm: normal EKG, normal sinus rhythm  Axis: Normal  Intervals:none  ST&T Change: Nonspecific; ? U waves  ____________________________________________  RADIOLOGY  ED MD interpretation: Increased linear bibasilar opacities  Official radiology report(s): Dg Chest Port 1 View  Result Date: 10/18/2018 CLINICAL DATA:  Burning in chest, emesis x3 days. Hx of COPD, HTN. Smoker.chest burning EXAM: PORTABLE CHEST 1 VIEW COMPARISON:  Radiograph 08/28/2018 FINDINGS: Normal cardiac silhouette. There is increased linear markings in LEFT and RIGHT lung base. Upper lungs clear. No osseous abnormality. IMPRESSION: Increased linear bibasilar opacities with differential including bronchitis, atelectasis or pneumonia Electronically Signed   By: Genevive Bi M.D.   On: 10/18/2018 06:02    ____________________________________________   PROCEDURES  Procedure(s) performed (including Critical Care):  Procedures  CRITICAL CARE Performed by: Irean Hong   Total critical care time: 30 minutes  Critical care time was exclusive of separately billable procedures and treating other patients.  Critical care was  necessary to treat or prevent imminent or life-threatening deterioration.  Critical care was time spent personally by me on the following activities: development of treatment plan with patient and/or surrogate as well as nursing, discussions with consultants, evaluation of patient's response to treatment, examination of patient, obtaining history from patient or surrogate, ordering and performing treatments and interventions, ordering and review of laboratory studies, ordering and review of radiographic studies, pulse oximetry and re-evaluation of patient's condition. ____________________________________________   INITIAL IMPRESSION / ASSESSMENT AND PLAN / ED COURSE  As part of my medical decision making, I reviewed the following data within the electronic MEDICAL RECORD NUMBER Nursing notes reviewed and incorporated, Labs reviewed, EKG interpreted, Old chart reviewed, Radiograph reviewed and Notes from prior ED visits        53 year old male who presents with epigastric burning; ran out of Nexium 3 days ago and has been drinking alcohol. Differential diagnosis includes, but is not limited to, biliary disease (biliary colic, acute cholecystitis, cholangitis, choledocholithiasis, etc), intrathoracic causes for epigastric abdominal pain including ACS, gastritis, duodenitis, pancreatitis, small bowel or large bowel obstruction, abdominal aortic aneurysm, hernia, and ulcer(s).  Mason Martin was evaluated in Emergency Department on 10/18/2018 for the symptoms described in the history of present illness. He was evaluated in the context of the global COVID-19 pandemic, which necessitated consideration that the patient might be at risk for infection with the SARS-CoV-2 virus that causes COVID-19. Institutional protocols and algorithms that pertain to the evaluation of patients at risk for COVID-19 are in a state of rapid change based on information released by regulatory bodies including the CDC and federal and  state organizations. These policies and algorithms were followed during the patient's care in the ED.  Will check basic lab work including EtOH.  Initiate IV fluid resuscitation, IV Pepcid and Zofran, and reassess.   Clinical Course as of Oct 18 638  Sun Oct 18, 2018  0510 Notified of critical potassium of 2.2; will start IV fluids with potassium.  Will discuss with hospitalist Dr. Sheryle Hail to evaluate patient in the emergency department for admission.  Will place patient on CIWA scale.   [JS]    Clinical Course User Index [JS] Irean Hong, MD     ____________________________________________   FINAL CLINICAL IMPRESSION(S) / ED DIAGNOSES  Final diagnoses:  Epigastric pain  Gastroesophageal reflux disease with esophagitis  Non-intractable vomiting with nausea, unspecified vomiting type  Hypokalemia  Hypomagnesemia     ED Discharge Orders    None       Note:  This document was prepared using Dragon voice  recognition software and may include unintentional dictation errors.   Irean Hong, MD 10/18/18 [phone]

## 2018-10-19 ENCOUNTER — Telehealth: Payer: Self-pay | Admitting: Gastroenterology

## 2018-10-19 DIAGNOSIS — E876 Hypokalemia: Secondary | ICD-10-CM | POA: Diagnosis not present

## 2018-10-19 LAB — IRON AND TIBC
Iron: 98 ug/dL (ref 45–182)
Saturation Ratios: 29 % (ref 17.9–39.5)
TIBC: 342 ug/dL (ref 250–450)
UIBC: 244 ug/dL

## 2018-10-19 LAB — CBC
HCT: 36.8 % — ABNORMAL LOW (ref 39.0–52.0)
Hemoglobin: 12.2 g/dL — ABNORMAL LOW (ref 13.0–17.0)
MCH: 29.8 pg (ref 26.0–34.0)
MCHC: 33.2 g/dL (ref 30.0–36.0)
MCV: 89.8 fL (ref 80.0–100.0)
Platelets: 317 10*3/uL (ref 150–400)
RBC: 4.1 MIL/uL — ABNORMAL LOW (ref 4.22–5.81)
RDW: 15 % (ref 11.5–15.5)
WBC: 6 10*3/uL (ref 4.0–10.5)
nRBC: 0 % (ref 0.0–0.2)

## 2018-10-19 LAB — VITAMIN B12: Vitamin B-12: 165 pg/mL — ABNORMAL LOW (ref 180–914)

## 2018-10-19 LAB — BASIC METABOLIC PANEL
Anion gap: 8 (ref 5–15)
BUN: <5 mg/dL — ABNORMAL LOW (ref 6–20)
CO2: 20 mmol/L — ABNORMAL LOW (ref 22–32)
Calcium: 7.9 mg/dL — ABNORMAL LOW (ref 8.9–10.3)
Chloride: 112 mmol/L — ABNORMAL HIGH (ref 98–111)
Creatinine, Ser: 0.8 mg/dL (ref 0.61–1.24)
GFR calc Af Amer: >60 mL/min (ref >60)
GFR calc non Af Amer: >60 mL/min (ref >60)
Glucose, Bld: 184 mg/dL — ABNORMAL HIGH (ref 70–99)
Potassium: 3.8 mmol/L (ref 3.5–5.1)
Sodium: 140 mmol/L (ref 135–145)

## 2018-10-19 LAB — FERRITIN: Ferritin: 26 ng/mL (ref 24–336)

## 2018-10-19 LAB — MAGNESIUM: Magnesium: 2 mg/dL (ref 1.7–2.4)

## 2018-10-19 LAB — FOLATE: Folate: 7.3 ng/mL (ref >5.9)

## 2018-10-19 MED ORDER — SODIUM CHLORIDE 0.9 % IV SOLN
510.0000 mg | Freq: Once | INTRAVENOUS | Status: DC
  Filled 2018-10-19: qty 17

## 2018-10-19 NOTE — Telephone Encounter (Signed)
2-C called & stated DR Allegra Lai had put in for her to have a follow up appointment at [facility] but he has not been seen since 2015 & does not see them or have a GI Doctor. Does he need to be schedule with Dr Allegra Lai for a 2wk hospital f/u? Please advise. He is being discharged from the hospital today.

## 2018-10-19 NOTE — Telephone Encounter (Signed)
-----   Message from Toney Reil, MD sent at 10/19/2018  9:52 AM EDT ----- Regarding: hospital f/u Can schedule f/u with me  RV

## 2018-10-19 NOTE — Discharge Summary (Signed)
[facility] - [place] [facility]   PATIENT NAME: Mason Martin    MR#:  [id number]  DATE OF BIRTH:  1965-10-10  DATE OF ADMISSION:  10/18/2018 ADMITTING PHYSICIAN: Arnaldo Natal, MD  DATE OF DISCHARGE: 10/19/2018 10:56 AM  PRIMARY CARE PHYSICIAN: System, Pcp Not In    ADMISSION DIAGNOSIS:  Hypokalemia [E87.6] Hypomagnesemia [E83.42] Epigastric pain [R10.13] Gastroesophageal reflux disease with esophagitis [K21.0] Non-intractable vomiting with nausea, unspecified vomiting type [R11.2]  DISCHARGE DIAGNOSIS:  Active Problems:   Hypokalemia   SECONDARY DIAGNOSIS:   Past Medical History:  Diagnosis Date  . 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 COPD (chronic obstructive pulmonary disease) (HCC)   . GERD (gastroesophageal reflux disease)   . History of meningitis   . HIV disease (HCC)    on HAART, follows with ID at [facility]  . Hypertension   . Neck pain    with left arm radiculopathy  . Pancreas cyst   . Pancreatitis    chronic pancreatitis  . TIA (transient ischemic attack)   . Tobacco use     HOSPITAL COURSE:   53 year old male with past medical history of HIV, GERD, COPD, previous history of pancreatitis, tobacco abuse, previous history of TIA who presents to the hospital due to intractable nausea vomiting and also noted to be hypokalemic.  1.  Intractable nausea and vomiting- secondary to gastritis.  Patient also had some coffee-ground emesis and therefore there was some concern for GI bleed.   -Patient was treated supportively with IV fluids, antiemetics and IV Protonix.  GI consult was obtained.  Did not recommend urgent endoscopic evaluation.  With supportive care patient is clinically improved.  His hemoglobin is stable he is tolerating a diet well.  He is therefore being discharged home with outpatient follow-up with gastroenterology.  2.  Hypokalemia/hypomagnesemia-secondary to the intractable nausea vomiting. -This has  improved and resolved with supplementation.  3.  Alcohol abuse- patient was maintained on thiamine folate and CIWA protocol.  Clinically he has no evidence of alcohol withdrawal presently.  DISCHARGE CONDITIONS:   Stable  CONSULTS OBTAINED:  Treatment Team:  Toney Reil, MD  DRUG ALLERGIES:   Allergies  Allergen Reactions  . Sulfur Hives    DISCHARGE MEDICATIONS:   Allergies as of 10/19/2018      Reactions   Sulfur Hives      Medication List    STOP taking these medications   azithromycin 250 MG tablet Commonly known as:  ZITHROMAX   brompheniramine-pseudoephedrine-DM 30-2-10 MG/5ML syrup   cyclobenzaprine 10 MG tablet Commonly known as:  FLEXERIL   meloxicam 15 MG tablet Commonly known as:  MOBIC   tazarotene 0.05 % cream Commonly known as:  TAZORAC     TAKE these medications   amLODipine 5 MG tablet Commonly known as:  NORVASC Take 5 mg by mouth daily.   cetirizine 10 MG tablet Commonly known as:  ZYRTEC Take 10 mg by mouth daily.   Creon 24000-76000 units Cpep Generic drug:  Pancrelipase (Lip-Prot-Amyl) Take 24,000 Units by mouth 2 (two) times daily with a meal.   enalapril 20 MG tablet Commonly known as:  VASOTEC Take 20 mg by mouth daily.   gabapentin 300 MG capsule Commonly known as:  NEURONTIN Take 300 mg by mouth 2 (two) times daily.   NexIUM 40 MG capsule Generic drug:  esomeprazole Take 40 mg by mouth daily before breakfast.   Prezcobix 800-150 MG tablet Generic drug:  darunavir-cobicistat Take 1 tablet by mouth daily.   Tivicay 50 MG tablet Generic drug:  dolutegravir Take 50 mg by mouth daily.   triamcinolone ointment 0.5 % Commonly known as:  KENALOG Apply 1 application topically 2 (two) times daily.   Ventolin HFA 108 (90 Base) MCG/ACT inhaler Generic drug:  albuterol Inhale 2 puffs into the lungs 4 (four) times daily as needed. For shortness of breath and/or wheezing.         DISCHARGE INSTRUCTIONS:    DIET:  Regular diet  DISCHARGE CONDITION:  Stable  ACTIVITY:  Activity as tolerated  OXYGEN:  Home Oxygen: No.   Oxygen Delivery: room air  DISCHARGE LOCATION:  home   If you experience worsening of your admission symptoms, develop shortness of breath, life threatening emergency, suicidal or homicidal thoughts you must seek medical attention immediately by calling 911 or calling your MD immediately  if symptoms less severe.  You Must read complete instructions/literature along with all the possible adverse reactions/side effects for all the Medicines you take and that have been prescribed to you. Take any new Medicines after you have completely understood and accpet all the possible adverse reactions/side effects.   Please note  You were cared for by a hospitalist during your hospital stay. If you have any questions about your discharge medications or the care you received while you were in the hospital after you are discharged, you can call the unit and asked to speak with the hospitalist on call if the hospitalist that took care of you is not available. Once you are discharged, your primary care physician will handle any further medical issues. Please note that NO REFILLS for any discharge medications will be authorized once you are discharged, as it is imperative that you return to your primary care physician (or establish a relationship with a primary care physician if you do not have one) for your aftercare needs so that they can reassess your need for medications and monitor your lab values.     Today   No acute events overnight, tolerating p.o. well.  No complaints presently.  Will discharge home today.  VITAL SIGNS:  Blood pressure 116/60, pulse 66, temperature 98.4 F (36.9 C), temperature source Oral, resp. rate 18, height  (1.626 m), weight 67.2 kg, SpO2 91 %.  I/O:    Intake/Output Summary (Last 24 hours) at 10/19/2018 1444 Last data filed at 10/19/2018  1003 Gross per 24 hour  Intake 2782.55 ml  Output 1200 ml  Net 1582.55 ml    PHYSICAL EXAMINATION:  GENERAL:  53 y.o.-year-old patient lying in the bed with no acute distress.  EYES: Pupils equal, round, reactive to light and accommodation. No scleral icterus. Extraocular muscles intact.  HEENT: Head atraumatic, normocephalic. Oropharynx and nasopharynx clear.  NECK:  Supple, no jugular venous distention. No thyroid enlargement, no tenderness.  LUNGS: Normal breath sounds bilaterally, no wheezing, rales,rhonchi. No use of accessory muscles of respiration.  CARDIOVASCULAR: S1, S2 normal. No murmurs, rubs, or gallops.  ABDOMEN: Soft, non-tender, non-distended. Bowel sounds present. No organomegaly or mass.  EXTREMITIES: No pedal edema, cyanosis, or clubbing.  NEUROLOGIC: Cranial nerves II through XII are intact. No focal motor or sensory defecits b/l.  PSYCHIATRIC: The patient is alert and oriented x 3. Good affect.  SKIN: No obvious rash, lesion, or ulcer.   DATA REVIEW:   CBC Recent Labs  Lab 10/19/18 0855  WBC 6.0  HGB 12.2*  HCT 36.8*  PLT 317    Chemistries  Recent Labs  Lab 10/18/18 0435  10/19/18 0455  NA 140  --  140  K 2.2*   < > 3.8  CL 99  --  112*  CO2 26  --  20*  GLUCOSE 114*  --  184*  BUN 11  --  <5*  CREATININE 0.72  --  0.80  CALCIUM 8.7*  --  7.9*  MG 1.6*  --  2.0  AST 21  --   --   ALT 13  --   --   ALKPHOS 103  --   --   BILITOT 0.4  --   --    < > = values in this interval not displayed.    Cardiac Enzymes Recent Labs  Lab 10/18/18 0435  TROPONINI <0.03     RADIOLOGY:  Dg Chest Port 1 View  Result Date: 10/18/2018 CLINICAL DATA:  Burning in chest, emesis x3 days. Hx of COPD, HTN. Smoker.chest burning EXAM: PORTABLE CHEST 1 VIEW COMPARISON:  Radiograph 08/28/2018 FINDINGS: Normal cardiac silhouette. There is increased linear markings in LEFT and RIGHT lung base. Upper lungs clear. No osseous abnormality. IMPRESSION: Increased linear  bibasilar opacities with differential including bronchitis, atelectasis or pneumonia Electronically Signed   By: Genevive BiStewart  Edmunds M.D.   On: 10/18/2018 06:02      Management plans discussed with the patient, family and they are in agreement.  CODE STATUS:  Code Status History    Date Active Date Inactive Code Status Order ID Comments User Context   10/18/2018 0628 10/19/2018 1401 Full Code [id number]  Arnaldo Nataliamond, Michael S, MD Inpatient   TOTAL TIME TAKING CARE OF THIS PATIENT: 40 minutes.    [place] SirenVivek J Nyree Applegate M.D on 10/19/2018 at 2:44 PM  Between 7am to 6pm - Pager - [phone]  After 6pm go to [email or web address] - Social research officer, governmentpassword [email or web address]  [facility] [place] [facility]  Office  [phone]  CC: Primary care physician; System, Pcp Not In

## 2018-10-19 NOTE — Telephone Encounter (Signed)
Patient has an appointment for 11-02-2018 for 2wk hospital f/u per Dr Allegra Lai

## 2018-10-19 NOTE — Telephone Encounter (Signed)
Pt has been scheduled for 11/02/2018, pt has been notified and verbalized understanding

## 2018-10-19 NOTE — Progress Notes (Signed)
Patient stable. Instructions given to patient with verbalized understanding. No RX to review. Patient to be discharged via private vehicle.

## 2018-10-20 MED ORDER — CETIRIZINE 10 MG TABLET
ORAL_TABLET | 0 refills | 0 days | Status: CP
Start: 2018-10-20 — End: 2018-11-05

## 2018-11-02 ENCOUNTER — Other Ambulatory Visit: Payer: Self-pay

## 2018-11-02 ENCOUNTER — Ambulatory Visit: Payer: Medicare Other | Admitting: Gastroenterology

## 2018-11-02 ENCOUNTER — Ambulatory Visit (INDEPENDENT_AMBULATORY_CARE_PROVIDER_SITE_OTHER): Payer: Medicare Other | Admitting: Gastroenterology

## 2018-11-02 DIAGNOSIS — K920 Hematemesis: Secondary | ICD-10-CM

## 2018-11-02 NOTE — Progress Notes (Signed)
Mason Donath, MD [address]  [address]  [place], [place] [address]  Main: [phone]  Fax: [phone]    Gastroenterology Consultation Video Visit  Referring Provider:     No ref. provider found Primary Care Physician:  System, Pcp Not In Primary Gastroenterologist:  Dr. Arlyss Repress Reason for Consultation:     Coffee-ground emesis, hospital follow-up        HPI:   Mason Martin is a 53 y.o. male referred by Dr. Cynda Familia, Pcp Not In  for consultation & management of coffee-ground emesis  Virtual Visit Video Note  I connected with Mason Martin on 11/02/18 at  9:00 AM EDT by video and verified that I am speaking with the correct person using two identifiers.   I discussed the limitations, risks, security and privacy concerns of performing an evaluation and management service by video and the availability of in person appointments. I also discussed with the patient that there may be a patient responsible charge related to this service. The patient expressed understanding and agreed to proceed.  Location of the Patient: Home  Location of the provider: Home office   History of Present Illness: Mason Martin is a 53 y.o. black male with HIV on antiretroviral therapy, tobacco use, alcohol use recently admitted to [facility] with 1 day history of epigastric pain, associated with nausea, nonbloody emesis followed by coffee-ground emesis after drinking alcohol which was self-limited, he had a slight drop in hemoglobin.  EGD was deferred at that time, managed with IV pantoprazole.  Patient had an EGD at [facility] in 07/2017 which was unremarkable. No evidence of acute pancreatitis.  He had history of acute alcoholic pancreatitis with pseudocyst formation in 07/2017.  Patient takes Creon and continues to take Nexium 40 mg daily  Since discharge, patient denies any recurrent episodes of coffee-ground emesis, epigastric pain, nausea or vomiting.  He continues to smoke and admits to drinking  alcohol.  He said he will be getting surveillance colonoscopy at [facility] next month and he prefers to undergo colonoscopy at [facility]    NSAIDs: None  Antiplts/Anticoagulants/Anti thrombotics: None  GI Procedures: EGD at [facility] 07/2017 for upper abdominal pain - Z-line regular, 39 cm from the incisors.  - Non-bleeding erosive gastropathy. Biopsied.  - No gross lesions in the entire examined duodenum. A.  Stomach, biopsy - Oxyntic mucosa with PPI effect - No Helicobacter or inflammatory process identified  Colonoscopy 2016   Impression:- Preparation of the colon was fair.  - Fibrous solids in the entire examined colon.  - Two 1 to 2 mm polyps in the descending colon and in the   ascending colon, removed with a cold biopsy forceps.   Resected and retrieved.  - Five 4 to 5 mm polyps in the sigmoid colon, in the   descending colon, in the transverse colon and in the   ascending colon, removed with a cold snare. Resected and   retrieved.  - Internal hemorrhoids.  - The examined portion of the ileum was normal. Diagnosis: A: Colon, cecum and ascending, biopsy -Adenomatous polyp (multiple fragments), no high grade dysplasia identified   B: Colon, descending, biopsy  -Adenomatous polyp (1 fragment)  Past Medical History:  Diagnosis Date  . 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 COPD (chronic obstructive pulmonary disease) (HCC)   . GERD (gastroesophageal reflux disease)   . History of meningitis   . HIV disease (HCC)  on HAART, [facility]  . Hypertension   . Neck pain    with left arm radiculopathy  . Pancreas cyst   . Pancreatitis    chronic pancreatitis  . TIA  (transient ischemic attack)   . Tobacco use     Past Surgical History:  Procedure Laterality Date  . ESOPHAGEAL ATRESIA REPAIR      Current Outpatient Medications:  .  NEXIUM 40 MG capsule, Take 40 mg by mouth daily before breakfast., Disp: , Rfl: 11 .  acetaminophen (TYLENOL) 325 MG tablet, Take by mouth., Disp: , Rfl:  .  amLODipine (NORVASC) 5 MG tablet, Take 5 mg by mouth daily., Disp: , Rfl:  .  amoxicillin (AMOXIL) 500 MG tablet, , Disp: , Rfl:  .  cetirizine (ZYRTEC) 10 MG tablet, Take 10 mg by mouth daily., Disp: , Rfl: 9 .  clobetasol ointment (TEMOVATE) 0.05 %, Apply topically., Disp: , Rfl:  .  CREON 24000 units CPEP, Take 24,000 Units by mouth 2 (two) times daily with a meal. , Disp: , Rfl: 5 .  cyclobenzaprine (FLEXERIL) 10 MG tablet, Take by mouth., Disp: , Rfl:  .  diclofenac (VOLTAREN) 75 MG EC tablet, Take by mouth., Disp: , Rfl:  .  enalapril (VASOTEC) 20 MG tablet, Take 20 mg by mouth daily., Disp: , Rfl: 8 .  gabapentin (NEURONTIN) 300 MG capsule, Take 300 mg by mouth 2 (two) times daily., Disp: , Rfl:  .  Hypertonic Nasal Wash (SINUS RINSE REFILL) PACK, Frequency:BID   Dosage:0.0     Instructions:  Note:Dose: BID, Disp: , Rfl:  .  ibuprofen (ADVIL) 800 MG tablet, , Disp: , Rfl:  .  meloxicam (MOBIC) 15 MG tablet, Take by mouth., Disp: , Rfl:  .  Nutritional Supplements (ENSURE NUTRITION SHAKE) LIQD, Take by mouth., Disp: , Rfl:  .  Oxycodone HCl 10 MG TABS, , Disp: , Rfl:  .  PREZCOBIX 800-150 MG tablet, Take 1 tablet by mouth daily., Disp: , Rfl:  .  sodium fluoride (PREVIDENT) 1.1 % GEL dental gel, Brush teeth with a pea-sized amount of the gel for 2 min and spit.  No rinsing. Do not eat or drink for 30 minutes after use. Use 2x/day., Disp: , Rfl:  .  sucralfate (CARAFATE) 1 g tablet, TAKE ONE TABLET BY MOUTH FOUR TIMES A DAY, Disp: , Rfl:  .  tazarotene (TAZORAC) 0.05 % cream, Apply topically., Disp: , Rfl:  .  TIVICAY 50 MG tablet, Take 50 mg by mouth daily.,  Disp: , Rfl: 11 .  traMADol (ULTRAM) 50 MG tablet, Take by mouth., Disp: , Rfl:  .  triamcinolone ointment (KENALOG) 0.1 %, , Disp: , Rfl:  .  triamcinolone ointment (KENALOG) 0.5 %, Apply 1 application topically 2 (two) times daily., Disp: 30 g, Rfl: 0 .  urea (CARMOL) 20 % cream, Apply topically to thick skin 1-2 times daily., Disp: , Rfl:  .  VENTOLIN HFA 108 (90 Base) MCG/ACT inhaler, Inhale 2 puffs into the lungs 4 (four) times daily as needed. For shortness of breath and/or wheezing., Disp: , Rfl: 5    Family History  Problem Relation Age of Onset  . Heart failure Father   . Emphysema Father      Social History   Tobacco Use  . Smoking status: Current Every Day Smoker    Packs/day: 0.25    Types: Cigarettes  . Smokeless tobacco: Never Used  Substance Use Topics  . Alcohol use: Yes  Alcohol/week: 0.0 standard drinks  . Drug use: No    Allergies as of 11/02/2018 - Review Complete 11/02/2018  Allergen Reaction Noted  . Sulfur Hives 02/09/2015     Imaging Studies: Reviewed  Assessment and Plan:   Mason Martin is a 53 y.o. male with HIV on ART, alcohol and tobacco use, history of acute alcoholic pancreatitis with pseudocyst formation in 2019, with recent episode of coffee-ground emesis in setting of alcohol use.  This was a self-limited episode without a significant drop in hemoglobin.  Patient does not have any recurrent episodes at this time.  Defer EGD.  Continue Nexium 40 mg daily.  Follow-up with [facility] for surveillance colonoscopy as patient prefers to continue his care at [facility]   Follow Up Instructions:   I discussed the assessment and treatment plan with the patient. The patient was provided an opportunity to ask questions and all were answered. The patient agreed with the plan and demonstrated an understanding of the instructions.   The patient was advised to call back or seek an in-person evaluation if the symptoms worsen or if the condition fails to improve  as anticipated.  I provided 10 minutes of face-to-face time during this encounter.   Follow up in as needed   Arlyss Repress, MD

## 2018-11-05 MED ORDER — CETIRIZINE 10 MG TABLET
ORAL_TABLET | 2 refills | 0 days | Status: CP
Start: 2018-11-05 — End: 2019-01-25

## 2018-11-16 MED ORDER — ENALAPRIL MALEATE 20 MG TABLET
ORAL_TABLET | 5 refills | 0 days | Status: CP
Start: 2018-11-16 — End: ?

## 2018-11-18 ENCOUNTER — Telehealth: Payer: Self-pay | Admitting: Gastroenterology

## 2018-11-18 NOTE — Telephone Encounter (Signed)
Spoke with pt and confirmed there is no confirmed xray appts scheduled

## 2018-11-18 NOTE — Telephone Encounter (Signed)
Pt is calling he is under the impression he is scheduled for an X ray today at 4:30 please call pt

## 2019-01-01 MED ORDER — TRIAMCINOLONE ACETONIDE 0.1 % TOPICAL OINTMENT
5 refills | 0 days | Status: CP
Start: 2019-01-01 — End: 2019-01-25

## 2019-01-01 MED ORDER — OXYCODONE 10 MG TABLET
ORAL_TABLET | Freq: Every day | ORAL | 0 refills | 0.00000 days | Status: CP | PRN
Start: 2019-01-01 — End: ?

## 2019-01-04 ENCOUNTER — Institutional Professional Consult (permissible substitution)
Admit: 2019-01-04 | Discharge: 2019-01-05 | Payer: MEDICARE | Attending: Infectious Disease | Primary: Infectious Disease

## 2019-01-04 DIAGNOSIS — B20 Human immunodeficiency virus [HIV] disease: Principal | ICD-10-CM

## 2019-01-05 ENCOUNTER — Emergency Department
Admission: EM | Admit: 2019-01-05 | Discharge: 2019-01-05 | Disposition: A | Discharge status: Home / Self Care | Payer: Medicare Other | Attending: Emergency Medicine | Admitting: Emergency Medicine

## 2019-01-05 ENCOUNTER — Encounter: Payer: Self-pay | Admitting: Emergency Medicine

## 2019-01-05 ENCOUNTER — Other Ambulatory Visit: Payer: Self-pay

## 2019-01-05 ENCOUNTER — Emergency Department: Payer: Medicare Other

## 2019-01-05 DIAGNOSIS — F17210 Nicotine dependence, cigarettes, uncomplicated: Secondary | ICD-10-CM | POA: Diagnosis not present

## 2019-01-05 DIAGNOSIS — Z8673 Personal history of transient ischemic attack (TIA), and cerebral infarction without residual deficits: Secondary | ICD-10-CM | POA: Insufficient documentation

## 2019-01-05 DIAGNOSIS — Z79899 Other long term (current) drug therapy: Secondary | ICD-10-CM | POA: Diagnosis not present

## 2019-01-05 DIAGNOSIS — J449 Chronic obstructive pulmonary disease, unspecified: Secondary | ICD-10-CM | POA: Diagnosis not present

## 2019-01-05 DIAGNOSIS — B20 Human immunodeficiency virus [HIV] disease: Secondary | ICD-10-CM | POA: Insufficient documentation

## 2019-01-05 DIAGNOSIS — M25512 Pain in left shoulder: Secondary | ICD-10-CM | POA: Diagnosis not present

## 2019-01-05 MED ORDER — DICLOFENAC SODIUM 50 MG PO TBEC: 50.0000 mg | DELAYED_RELEASE_TABLET | Freq: Three times a day (TID) | ORAL | 0 refills | Status: AC

## 2019-01-05 MED ORDER — CYCLOBENZAPRINE HCL 5 MG PO TABS: 5.0000 mg | ORAL_TABLET | Freq: Three times a day (TID) | ORAL | 0 refills | Status: DC | PRN

## 2019-01-05 NOTE — Discharge Instructions (Signed)
Your exam and x-ray are negative for any fracture. You may have a mild tendinitis to the shoulder. Take the prescription meds as directed. Follow-up with your provider for ongoing symptoms.

## 2019-01-05 NOTE — ED Triage Notes (Signed)
Patient presents to the ED with left shoulder pain x 1 week.  Patient reports pain is worse with arm movement.  Denies known injury.  Patient reports some relief with heating pad.

## 2019-01-05 NOTE — ED Provider Notes (Signed)
[facility] Emergency Department Provider Note ____________________________________________  Time seen: 57  I have reviewed the triage vital signs and the nursing notes.  HISTORY  Chief Complaint  Shoulder Pain  HPI Mason Martin is a 53 y.o. male presents himself to the ED for evaluation of left shoulder pain.  Patient describes the pain is been present for about the last week.  Describes pain is intermittent and occasionally catches when he moves the arm.  He localizes pain to the posterior shoulder and deltoid region.  He denies any known injury, accident, or trauma.  Also denies any previous history of remote injury or chronic ongoing shoulder pain.  Patient has used a heating pad with some intermittent relief, but is not taking any medications.  He denies any distal paresthesias, chest pain, shortness of breath, or weakness.  Past Medical History:  Diagnosis Date  . 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 COPD (chronic obstructive pulmonary disease) ([place])   . GERD (gastroesophageal reflux disease)   . History of meningitis   . HIV disease ([place])    on HAART, follows with ID at [facility]  . Hypertension   . Neck pain    with left arm radiculopathy  . Pancreas cyst   . Pancreatitis    chronic pancreatitis  . TIA (transient ischemic attack)   . Tobacco use     Patient Active Problem List   Diagnosis Date Noted  . HTN (hypertension) 05/27/2017  . GERD (gastroesophageal reflux disease) 05/27/2017  . COPD (chronic obstructive pulmonary disease) ([place]) 05/27/2017  . HIV (human immunodeficiency virus infection) ([place]) 05/27/2017  . Hypokalemia 05/27/2017  . ETOH abuse 07/06/2015  . Acute recurrent pancreatitis 07/01/2015  . DVT (deep venous thrombosis) ([place]) 10/25/2013  . Pancreatitis 02/15/2013  . Tobacco use disorder 02/15/2013  . 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 ESOPHAGEAL ATRESIA  REPAIR      Prior to Admission medications   Medication Sig Start Date End Date Taking? Authorizing Provider  acetaminophen (TYLENOL) 325 MG tablet Take by mouth. 01/18/15   [provider]  amLODipine (NORVASC) 5 MG tablet Take 5 mg by mouth daily.    [provider]  cetirizine (ZYRTEC) 10 MG tablet Take 10 mg by mouth daily. 04/27/15   [provider]  clobetasol ointment (TEMOVATE) 0.05 % Apply topically. 04/13/18 04/13/19  [provider]  CREON 24000 units CPEP Take 24,000 Units by mouth 2 (two) times daily with a meal.  06/22/15   [provider]  cyclobenzaprine (FLEXERIL) 5 MG tablet Take 1 tablet (5 mg total) by mouth 3 (three) times daily as needed. 01/05/19   Aundria Bitterman, Dannielle Karvonen, PA-C  diclofenac (VOLTAREN) 50 MG EC tablet Take 1 tablet (50 mg total) by mouth 3 (three) times daily for 10 days. 01/05/19 01/15/19  Olivia Royse, Dannielle Karvonen, PA-C  enalapril (VASOTEC) 20 MG tablet Take 20 mg by mouth daily. 06/22/15   [provider]  gabapentin (NEURONTIN) 300 MG capsule Take 300 mg by mouth 2 (two) times daily.    [provider]  Hypertonic Nasal Wash (SINUS RINSE REFILL) PACK Frequency:BID   Dosage:0.0     Instructions:  Note:Dose: BID 09/03/12   [provider]  NEXIUM 40 MG capsule Take 40 mg by mouth daily before breakfast. 06/22/15   [provider]  Nutritional Supplements (ENSURE NUTRITION SHAKE) LIQD Take by mouth. 03/29/16  [provider]  PREZCOBIX 800-150 MG tablet Take 1 tablet by mouth daily.    [provider]  sodium fluoride (PREVIDENT) 1.1 % GEL dental gel Brush teeth with a pea-sized amount of the gel for 2 min and spit.  No rinsing. Do not eat or drink for 30 minutes after use. Use 2x/day. 06/09/18   [provider]  sucralfate (CARAFATE) 1 g tablet TAKE ONE TABLET BY MOUTH FOUR TIMES A DAY 07/27/18   [provider]  tazarotene (TAZORAC) 0.05 % cream Apply  topically. 01/28/18   [provider]  TIVICAY 50 MG tablet Take 50 mg by mouth daily. 06/22/15   [provider]  triamcinolone ointment (KENALOG) 0.1 %  07/08/18   [provider]  triamcinolone ointment (KENALOG) 0.5 % Apply 1 application topically 2 (two) times daily. 01/28/18   Enid DerryWagner, Ashley, PA-C  urea (CARMOL) 20 % cream Apply topically to thick skin 1-2 times daily. 08/26/17   [provider]  VENTOLIN HFA 108 (90 Base) MCG/ACT inhaler Inhale 2 puffs into the lungs 4 (four) times daily as needed. For shortness of breath and/or wheezing. 06/22/15   [provider]    Allergies Sulfur  Family History  Problem Relation Age of Onset  . Heart failure Father   . Emphysema Father     Social History Social History   Tobacco Use  . Smoking status: Current Every Day Smoker    Packs/day: 0.25    Types: Cigarettes  . Smokeless tobacco: Never Used  Substance Use Topics  . Alcohol use: Yes    Alcohol/week: 0.0 standard drinks  . Drug use: No    Review of Systems  Constitutional: Negative for fever. Cardiovascular: Negative for chest pain. Respiratory: Negative for shortness of breath. Musculoskeletal: Negative for back pain.  Left shoulder pain as above. Skin: Negative for rash. Neurological: Negative for headaches, focal weakness or numbness. ____________________________________________  PHYSICAL EXAM:  VITAL SIGNS: ED Triage Vitals  Enc Vitals Group     BP 01/05/19 1707 128/82     Pulse Rate 01/05/19 1707 83     Resp 01/05/19 1707 16     Temp 01/05/19 1707 99.1 F (37.3 C)     Temp Source 01/05/19 1707 Oral     SpO2 01/05/19 1707 94 %     Weight 01/05/19 1708 148 lb (67.1 kg)     Height 01/05/19 1708 5\' 4"  (1.626 m)     Head Circumference --      Peak Flow --      Pain Score 01/05/19 1708 9     Pain Loc --      Pain Edu? --      Excl. in GC? --     Constitutional: Alert and oriented. Well appearing and in no  distress. Head: Normocephalic and atraumatic. Eyes: Conjunctivae are normal. Normal extraocular movements Neck: Supple. No thyromegaly. Cardiovascular: Normal rate, regular rhythm. Normal distal pulses. Respiratory: Normal respiratory effort.  Musculoskeletal: Left shoulder without any obvious deformity, dislocation, or sulcus sign.  Patient is able to demonstrate normal active range of motion without difficulty or crepitus.  Normal internal and external rotation is elicited.  Rotator cuff testing is solid and intact bilaterally.  Normal composite fist and grip strength noted bilaterally.  Nontender with normal range of motion in all extremities.  Neurologic:  Normal gross sensation. Normal speech and language. No gross focal neurologic deficits are appreciated. Skin:  Skin is warm, dry and intact. No rash noted.  Psychiatric: Mood and affect are normal. Patient exhibits appropriate insight and judgment. ____________________________________________   RADIOLOGY  Left shoulder  Negative  I, Denali Sharma V Bacon-Leshia Kope, personally viewed and evaluated these images (plain radiographs) as part of my medical decision making, as well as reviewing the written report by the radiologist. ____________________________________________  PROCEDURES  Procedures ____________________________________________  INITIAL IMPRESSION / ASSESSMENT AND PLAN / ED COURSE  Mason Martin was evaluated in Emergency Department on 01/05/2019 for the symptoms described in the history of present illness. He was evaluated in the context of the global COVID-19 pandemic, which necessitated consideration that the patient might be at risk for infection with the SARS-CoV-2 virus that causes COVID-19. Institutional protocols and algorithms that pertain to the evaluation of patients at risk for COVID-19 are in a state of rapid change based on information released by regulatory bodies including the CDC and federal and state organizations.  These policies and algorithms were followed during the patient's care in the ED.  Patient with ED evaluation of a one-week complaint of intermittent left shoulder pain.  Patient injury without preceding accident, injury, or trauma.  His exam is overall benign and reassuring at this time.  Notification of internal derangement or rotator cuff deficit.  Patient symptoms likely represent a mild tendinitis.  As such will be placed on cyclobenzaprine and diclofenac for muscle spasm and anti-inflammatory benefit, respectively.  He is referred to orthopedics for ongoing symptom management. ____________________________________________  FINAL CLINICAL IMPRESSION(S) / ED DIAGNOSES  Final diagnoses:  Acute pain of left shoulder      Karmen StabsMenshew, Charlesetta IvoryJenise V Bacon, PA-C 01/05/19 2340    Arnaldo NatalMalinda, Paul F, MD 01/05/19 2344

## 2019-01-05 NOTE — ED Triage Notes (Signed)
First Nurse Note:  C?O left shoulder pain x 1 week.  Denies injury.  States pain worse when moving left arm / shoulder ROM

## 2019-01-25 ENCOUNTER — Ambulatory Visit: Admit: 2019-01-25 | Discharge: 2019-01-26 | Payer: MEDICARE | Attending: Dermatology | Primary: Dermatology

## 2019-01-25 DIAGNOSIS — R21 Rash and other nonspecific skin eruption: Principal | ICD-10-CM

## 2019-01-25 MED ORDER — TRIAMCINOLONE ACETONIDE 0.1 % TOPICAL OINTMENT
5 refills | 0 days | Status: CP
Start: 2019-01-25 — End: ?

## 2019-01-25 MED ORDER — CETIRIZINE 10 MG TABLET
ORAL_TABLET | 2 refills | 0 days | Status: CP
Start: 2019-01-25 — End: ?

## 2019-02-17 MED ORDER — TIVICAY 50 MG TABLET
ORAL_TABLET | 0 refills | 0 days | Status: CP
Start: 2019-02-17 — End: ?

## 2019-02-17 MED ORDER — PREZCOBIX 800 MG-150 MG TABLET
ORAL_TABLET | 0 refills | 0 days | Status: CP
Start: 2019-02-17 — End: ?

## 2019-03-15 ENCOUNTER — Emergency Department
Admission: EM | Admit: 2019-03-15 | Discharge: 2019-03-15 | Disposition: A | Discharge status: Home / Self Care | Payer: Medicare Other | Attending: Emergency Medicine | Admitting: Emergency Medicine

## 2019-03-15 ENCOUNTER — Other Ambulatory Visit: Payer: Self-pay

## 2019-03-15 ENCOUNTER — Ambulatory Visit
Admit: 2019-03-15 | Discharge: 2019-03-16 | Payer: MEDICARE | Attending: Student in an Organized Health Care Education/Training Program | Primary: Student in an Organized Health Care Education/Training Program

## 2019-03-15 DIAGNOSIS — R21 Rash and other nonspecific skin eruption: Secondary | ICD-10-CM

## 2019-03-15 DIAGNOSIS — I10 Essential (primary) hypertension: Secondary | ICD-10-CM | POA: Insufficient documentation

## 2019-03-15 DIAGNOSIS — Z79899 Other long term (current) drug therapy: Secondary | ICD-10-CM | POA: Diagnosis not present

## 2019-03-15 DIAGNOSIS — M7582 Other shoulder lesions, left shoulder: Secondary | ICD-10-CM | POA: Insufficient documentation

## 2019-03-15 DIAGNOSIS — F17210 Nicotine dependence, cigarettes, uncomplicated: Secondary | ICD-10-CM | POA: Insufficient documentation

## 2019-03-15 DIAGNOSIS — Z21 Asymptomatic human immunodeficiency virus [HIV] infection status: Secondary | ICD-10-CM | POA: Diagnosis not present

## 2019-03-15 DIAGNOSIS — J449 Chronic obstructive pulmonary disease, unspecified: Secondary | ICD-10-CM | POA: Diagnosis not present

## 2019-03-15 DIAGNOSIS — M25512 Pain in left shoulder: Secondary | ICD-10-CM | POA: Diagnosis present

## 2019-03-15 DIAGNOSIS — M778 Other enthesopathies, not elsewhere classified: Secondary | ICD-10-CM

## 2019-03-15 MED ORDER — DICLOFENAC SODIUM 75 MG PO TBEC: 75.0000 mg | DELAYED_RELEASE_TABLET | Freq: Two times a day (BID) | ORAL | 0 refills | Status: DC

## 2019-03-15 MED ORDER — LIDOCAINE 5 % EX PTCH
1.0000 | MEDICATED_PATCH | CUTANEOUS | Status: DC
  Administered 2019-03-15: 1 via TRANSDERMAL
  Filled 2019-03-15: qty 1

## 2019-03-15 MED ORDER — BETAMETHASONE, AUGMENTED 0.05 % TOPICAL OINTMENT
0 refills | 0 days | Status: CP
Start: 2019-03-15 — End: ?

## 2019-03-15 MED ORDER — CLOBETASOL 0.05 % TOPICAL OINTMENT
Freq: Two times a day (BID) | TOPICAL | 1 refills | 0.00000 days | Status: CP
Start: 2019-03-15 — End: 2019-03-15

## 2019-03-15 NOTE — ED Notes (Signed)
Pt reporting similar left sided shoulder pain 1 month ago that he was treated for. Pain subsided and returned this weekend. No new trauma or injury. Pt holding left arm and protecting left shoulder from movement. No obvious deformity or swelling.

## 2019-03-15 NOTE — Discharge Instructions (Addendum)
Follow-up with orthopedic for definitive evaluation and treatment.

## 2019-03-15 NOTE — ED Provider Notes (Signed)
[facility] Emergency Department Provider Note   ____________________________________________   First MD Initiated Contact with Patient 03/15/19 1125     (approximate)  I have reviewed the triage vital signs and the nursing notes.   HISTORY  Chief Complaint Shoulder Pain    HPI Mason Martin is a 53 y.o. male patient return back for evaluation of left shoulder pain.  Patient was seen this facility 2 months ago and diagnosed with tendinitis.  Patient did not follow up with orthopedics as directed.  Patient rates his pain as a 10/10.  Patient described the pain is "achy".  Patient state mild transient relief with NSAIDs.         Past Medical History:  Diagnosis Date  . 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 COPD (chronic obstructive pulmonary disease) ([place])   . GERD (gastroesophageal reflux disease)   . History of meningitis   . HIV disease ([place])    on HAART, follows with ID at [facility]  . Hypertension   . Neck pain    with left arm radiculopathy  . Pancreas cyst   . Pancreatitis    chronic pancreatitis  . TIA (transient ischemic attack)   . Tobacco use     Patient Active Problem List   Diagnosis Date Noted  . HTN (hypertension) 05/27/2017  . GERD (gastroesophageal reflux disease) 05/27/2017  . COPD (chronic obstructive pulmonary disease) ([place]) 05/27/2017  . HIV (human immunodeficiency virus infection) ([place]) 05/27/2017  . Hypokalemia 05/27/2017  . ETOH abuse 07/06/2015  . Acute recurrent pancreatitis 07/01/2015  . DVT (deep venous thrombosis) ([place]) 10/25/2013  . Pancreatitis 02/15/2013  . Tobacco use disorder 02/15/2013  . 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 ESOPHAGEAL ATRESIA REPAIR      Prior to Admission medications   Medication Sig Start Date End Date Taking? Authorizing Provider  acetaminophen (TYLENOL) 325 MG tablet Take by mouth. 01/18/15   [provider]  amLODipine (NORVASC) 5 MG tablet Take 5 mg by mouth daily.    [provider]  cetirizine (ZYRTEC) 10 MG tablet Take 10 mg by mouth daily. 04/27/15   [provider]  clobetasol ointment (TEMOVATE) 0.05 % Apply topically. 04/13/18 04/13/19  [provider]  CREON 24000 units CPEP Take 24,000 Units by mouth 2 (two) times daily with a meal.  06/22/15   [provider]  cyclobenzaprine (FLEXERIL) 5 MG tablet Take 1 tablet (5 mg total) by mouth 3 (three) times daily as needed. 01/05/19   Menshew, Dannielle Karvonen, PA-C  diclofenac (VOLTAREN) 75 MG EC tablet Take 1 tablet (75 mg total) by mouth 2 (two) times daily. 03/15/19   Sable Feil, PA-C  enalapril (VASOTEC) 20 MG tablet Take 20 mg by mouth daily. 06/22/15   [provider]  gabapentin (NEURONTIN) 300 MG capsule Take 300 mg by mouth 2 (two) times daily.    [provider]  Hypertonic Nasal Wash (SINUS RINSE REFILL) PACK Frequency:BID   Dosage:0.0     Instructions:  Note:Dose: BID 09/03/12   [provider]  NEXIUM 40 MG capsule Take 40 mg by mouth daily before breakfast. 06/22/15   [provider]  Nutritional Supplements (ENSURE NUTRITION SHAKE) LIQD Take by mouth. 03/29/16   [provider]  PREZCOBIX 800-150 MG tablet Take 1 tablet by mouth daily.    [provider]  sodium fluoride (PREVIDENT) 1.1 %  GEL dental gel Brush teeth with a pea-sized amount of the gel for 2 min and spit.  No rinsing. Do not eat or drink for 30 minutes after use. Use 2x/day. 06/09/18   [provider]  sucralfate (CARAFATE) 1 g tablet TAKE ONE TABLET BY MOUTH FOUR TIMES A DAY 07/27/18   [provider]  tazarotene (TAZORAC) 0.05 % cream Apply topically. 01/28/18   [provider]  TIVICAY 50 MG tablet Take 50 mg by mouth daily. 06/22/15   [provider]  triamcinolone ointment (KENALOG) 0.1 %  07/08/18   [provider]   triamcinolone ointment (KENALOG) 0.5 % Apply 1 application topically 2 (two) times daily. 01/28/18   Enid DerryWagner, Ashley, PA-C  urea (CARMOL) 20 % cream Apply topically to thick skin 1-2 times daily. 08/26/17   [provider]  VENTOLIN HFA 108 (90 Base) MCG/ACT inhaler Inhale 2 puffs into the lungs 4 (four) times daily as needed. For shortness of breath and/or wheezing. 06/22/15   [provider]    Allergies Sulfur  Family History  Problem Relation Age of Onset  . Heart failure Father   . Emphysema Father     Social History Social History   Tobacco Use  . Smoking status: Current Every Day Smoker    Packs/day: 0.25    Types: Cigarettes  . Smokeless tobacco: Never Used  Substance Use Topics  . Alcohol use: Yes    Alcohol/week: 0.0 standard drinks  . Drug use: No    Review of Systems  Constitutional: No fever/chills Eyes: No visual changes. ENT: No sore throat. Cardiovascular: Denies chest pain. Respiratory: Denies shortness of breath. Gastrointestinal: No abdominal pain.  No nausea, no vomiting.  No diarrhea.  No constipation. Genitourinary: Negative for dysuria. Musculoskeletal: Negative for back pain. Skin: Negative for rash. Neurological: Negative for headaches, focal weakness or numbness. Endocrine:  Hypertension Hematological/Lymphatic:  HIV Allergic/Immunilogical: Sulfa antibiotics ____________________________________________   PHYSICAL EXAM:  VITAL SIGNS: ED Triage Vitals  Enc Vitals Group     BP 03/15/19 1116 (!) 146/82     Pulse Rate 03/15/19 1116 70     Resp 03/15/19 1116 18     Temp 03/15/19 1116 98.2 F (36.8 C)     Temp src --      SpO2 03/15/19 1116 99 %     Weight 03/15/19 1118 140 lb (63.5 kg)     Height 03/15/19 1118 5\' 5"  (1.651 m)     Head Circumference --      Peak Flow --      Pain Score 03/15/19 1118 10     Pain Loc --      Pain Edu? --      Excl. in GC? --    Constitutional: Alert and oriented. Well appearing and in  no acute distress. Cardiovascular: Normal rate, regular rhythm. Grossly normal heart sounds.  Good peripheral circulation. Respiratory: Normal respiratory effort.  No retractions. Lungs CTAB. Gastrointestinal: Soft and nontender. No distention. No abdominal bruits. No CVA tenderness. Musculoskeletal: No obvious deformity to the left shoulder.  Patient is full neck range of motion.  Patient strength against resistance is 4/5 bilaterally.  Neurologic:  Normal speech and language. No gross focal neurologic deficits are appreciated. No gait instability. Skin:  Skin is warm, dry and intact. No rash noted. Psychiatric: Mood and affect are normal. Speech and behavior are normal.  ____________________________________________   LABS (all labs ordered are listed, but only abnormal results are displayed)  Labs Reviewed -  No data to display ____________________________________________  EKG   ____________________________________________  RADIOLOGY  ED MD interpretation:    Official radiology report(s): No results found.  ____________________________________________   PROCEDURES  Procedure(s) performed (including Critical Care):  Procedures   ____________________________________________   INITIAL IMPRESSION / ASSESSMENT AND PLAN / ED COURSE  As part of my medical decision making, I reviewed the following data within the electronic MEDICAL RECORD NUMBER         Patient presents with recurrent left shoulder pain secondary to tendinitis.  Discussed with patient rationale follow-up orthopedic for definitive evaluation and treatment.  Patient given discharge care instructions and a prescription for Voltaren.      ____________________________________________   FINAL CLINICAL IMPRESSION(S) / ED DIAGNOSES  Final diagnoses:  Left shoulder tendinitis     ED Discharge Orders         Ordered    diclofenac (VOLTAREN) 75 MG EC tablet  2 times daily     03/15/19 1141            Note:  This document was prepared using Dragon voice recognition software and may include unintentional dictation errors.    Joni Reining, PA-C 03/15/19 1146    Emily Filbert, MD 03/15/19 1421

## 2019-03-15 NOTE — ED Triage Notes (Signed)
L shoulder pain with movement x 2 days. Denies injury. Has had this before a couple months ago.

## 2019-03-17 DIAGNOSIS — B20 Human immunodeficiency virus [HIV] disease: Secondary | ICD-10-CM

## 2019-03-18 ENCOUNTER — Other Ambulatory Visit: Payer: Self-pay | Admitting: Sports Medicine

## 2019-03-18 DIAGNOSIS — M778 Other enthesopathies, not elsewhere classified: Secondary | ICD-10-CM

## 2019-03-18 DIAGNOSIS — G8929 Other chronic pain: Secondary | ICD-10-CM

## 2019-03-18 DIAGNOSIS — M7542 Impingement syndrome of left shoulder: Secondary | ICD-10-CM

## 2019-03-18 DIAGNOSIS — M25512 Pain in left shoulder: Secondary | ICD-10-CM

## 2019-03-18 MED ORDER — TIVICAY 50 MG TABLET
ORAL_TABLET | 11 refills | 0 days | Status: CP
Start: 2019-03-18 — End: ?

## 2019-03-18 MED ORDER — PREZCOBIX 800 MG-150 MG TABLET
ORAL_TABLET | 11 refills | 0 days | Status: CP
Start: 2019-03-18 — End: ?

## 2019-03-19 ENCOUNTER — Ambulatory Visit: Admit: 2019-03-19 | Discharge: 2019-03-20 | Payer: MEDICARE

## 2019-03-19 DIAGNOSIS — B20 Human immunodeficiency virus [HIV] disease: Secondary | ICD-10-CM

## 2019-03-19 DIAGNOSIS — R21 Rash and other nonspecific skin eruption: Secondary | ICD-10-CM

## 2019-03-19 MED ORDER — PREDNISONE 10 MG TABLET
ORAL_TABLET | 0 refills | 0 days | Status: CP
Start: 2019-03-19 — End: ?

## 2019-03-19 MED ORDER — FLUOCINOLONE 0.01 % TOPICAL BODY OIL
TOPICAL | 5 refills | 0.00000 days | Status: CP
Start: 2019-03-19 — End: 2019-03-19

## 2019-03-19 MED ORDER — FLUOCINOLONE 0.01 % TOPICAL BODY OIL: mL | 5 refills | 0 days | Status: AC

## 2019-03-23 ENCOUNTER — Encounter: Admit: 2019-03-23 | Discharge: 2019-03-24 | Payer: MEDICARE | Attending: Family | Primary: Family

## 2019-03-23 ENCOUNTER — Ambulatory Visit: Admit: 2019-03-23 | Discharge: 2019-03-24 | Payer: MEDICARE

## 2019-03-23 ENCOUNTER — Ambulatory Visit: Admit: 2019-03-23 | Discharge: 2019-03-24 | Payer: MEDICARE | Attending: Family | Primary: Family

## 2019-03-23 DIAGNOSIS — M549 Dorsalgia, unspecified: Secondary | ICD-10-CM

## 2019-03-23 DIAGNOSIS — F17210 Nicotine dependence, cigarettes, uncomplicated: Secondary | ICD-10-CM

## 2019-03-23 DIAGNOSIS — M25512 Pain in left shoulder: Secondary | ICD-10-CM

## 2019-03-23 DIAGNOSIS — K219 Gastro-esophageal reflux disease without esophagitis: Secondary | ICD-10-CM

## 2019-03-23 DIAGNOSIS — L309 Dermatitis, unspecified: Secondary | ICD-10-CM

## 2019-03-23 DIAGNOSIS — B20 Human immunodeficiency virus [HIV] disease: Secondary | ICD-10-CM

## 2019-03-23 DIAGNOSIS — M79645 Pain in left finger(s): Secondary | ICD-10-CM

## 2019-03-23 DIAGNOSIS — Z86010 Personal history of colonic polyps: Secondary | ICD-10-CM

## 2019-03-23 DIAGNOSIS — Z79899 Other long term (current) drug therapy: Secondary | ICD-10-CM

## 2019-03-23 DIAGNOSIS — Z882 Allergy status to sulfonamides status: Secondary | ICD-10-CM

## 2019-03-23 DIAGNOSIS — G8929 Other chronic pain: Secondary | ICD-10-CM

## 2019-03-23 DIAGNOSIS — I10 Essential (primary) hypertension: Secondary | ICD-10-CM

## 2019-03-23 DIAGNOSIS — Z1159 Encounter for screening for other viral diseases: Secondary | ICD-10-CM

## 2019-03-23 MED ORDER — OXYCODONE 10 MG TABLET
ORAL_TABLET | Freq: Four times a day (QID) | ORAL | 0 refills | 2.00000 days | Status: CP | PRN
Start: 2019-03-23 — End: 2019-04-07

## 2019-03-25 ENCOUNTER — Other Ambulatory Visit: Payer: Self-pay

## 2019-03-25 ENCOUNTER — Ambulatory Visit
Admission: RE | Admit: 2019-03-25 | Discharge: 2019-03-25 | Disposition: A | Discharge status: Home / Self Care | Payer: Medicare Other | Source: Ambulatory Visit | Attending: Sports Medicine | Admitting: Sports Medicine

## 2019-03-25 DIAGNOSIS — Z79899 Other long term (current) drug therapy: Secondary | ICD-10-CM

## 2019-03-25 DIAGNOSIS — Z1159 Encounter for screening for other viral diseases: Secondary | ICD-10-CM

## 2019-03-25 DIAGNOSIS — Z139 Encounter for screening, unspecified: Secondary | ICD-10-CM

## 2019-03-25 DIAGNOSIS — M7542 Impingement syndrome of left shoulder: Secondary | ICD-10-CM | POA: Insufficient documentation

## 2019-03-25 DIAGNOSIS — M778 Other enthesopathies, not elsewhere classified: Secondary | ICD-10-CM

## 2019-03-25 DIAGNOSIS — M7582 Other shoulder lesions, left shoulder: Secondary | ICD-10-CM | POA: Insufficient documentation

## 2019-03-25 DIAGNOSIS — G8929 Other chronic pain: Secondary | ICD-10-CM | POA: Diagnosis present

## 2019-03-25 DIAGNOSIS — M25512 Pain in left shoulder: Secondary | ICD-10-CM | POA: Diagnosis present

## 2019-03-29 ENCOUNTER — Ambulatory Visit
Admit: 2019-03-29 | Discharge: 2019-03-30 | Payer: MEDICARE | Attending: Student in an Organized Health Care Education/Training Program | Primary: Student in an Organized Health Care Education/Training Program

## 2019-03-29 DIAGNOSIS — R21 Rash and other nonspecific skin eruption: Secondary | ICD-10-CM

## 2019-03-30 DIAGNOSIS — Z79899 Other long term (current) drug therapy: Secondary | ICD-10-CM

## 2019-03-30 DIAGNOSIS — R21 Rash and other nonspecific skin eruption: Secondary | ICD-10-CM

## 2019-03-31 ENCOUNTER — Ambulatory Visit: Payer: Medicare Other

## 2019-04-05 MED ORDER — FOLIC ACID 1 MG TABLET
ORAL_TABLET | Freq: Every day | ORAL | 3 refills | 100.00000 days | Status: CP
Start: 2019-04-05 — End: 2020-04-04

## 2019-04-05 MED ORDER — METHOTREXATE SODIUM 2.5 MG TABLET
ORAL_TABLET | ORAL | 0 refills | 35.00000 days | Status: CP
Start: 2019-04-05 — End: 2019-05-10

## 2019-04-07 ENCOUNTER — Ambulatory Visit: Admit: 2019-04-07 | Discharge: 2019-04-08 | Payer: MEDICARE | Attending: Family | Primary: Family

## 2019-04-07 DIAGNOSIS — Z20828 Contact with and (suspected) exposure to other viral communicable diseases: Secondary | ICD-10-CM

## 2019-04-07 DIAGNOSIS — M25512 Pain in left shoulder: Secondary | ICD-10-CM

## 2019-04-07 MED ORDER — OXYCODONE 10 MG TABLET
ORAL_TABLET | Freq: Four times a day (QID) | ORAL | 0 refills | 7.00000 days | Status: CP | PRN
Start: 2019-04-07 — End: 2019-04-09

## 2019-04-14 ENCOUNTER — Ambulatory Visit: Admit: 2019-04-14 | Discharge: 2019-04-15 | Payer: MEDICARE | Attending: Family | Primary: Family

## 2019-04-14 DIAGNOSIS — L988 Other specified disorders of the skin and subcutaneous tissue: Principal | ICD-10-CM

## 2019-04-14 DIAGNOSIS — B351 Tinea unguium: Principal | ICD-10-CM

## 2019-04-14 DIAGNOSIS — B20 Human immunodeficiency virus [HIV] disease: Principal | ICD-10-CM

## 2019-04-14 DIAGNOSIS — K122 Cellulitis and abscess of mouth: Principal | ICD-10-CM

## 2019-04-14 DIAGNOSIS — M25512 Pain in left shoulder: Principal | ICD-10-CM

## 2019-04-14 MED ORDER — CHLORHEXIDINE GLUCONATE 0.12 % MOUTHWASH: 15 mL | mL | Freq: Two times a day (BID) | 0 refills | 15 days | Status: AC

## 2019-04-14 MED ORDER — NYSTATIN 100,000 UNIT/ML ORAL SUSPENSION: 100000 [IU] | mL | Freq: Four times a day (QID) | 0 refills | 15 days | Status: AC

## 2019-04-22 ENCOUNTER — Emergency Department
Admission: EM | Admit: 2019-04-22 | Discharge: 2019-04-23 | Disposition: A | Discharge status: Home / Self Care | Payer: Medicare Other | Attending: Emergency Medicine | Admitting: Emergency Medicine

## 2019-04-22 ENCOUNTER — Other Ambulatory Visit: Payer: Self-pay

## 2019-04-22 ENCOUNTER — Emergency Department: Payer: Medicare Other

## 2019-04-22 DIAGNOSIS — F17210 Nicotine dependence, cigarettes, uncomplicated: Secondary | ICD-10-CM | POA: Insufficient documentation

## 2019-04-22 DIAGNOSIS — Z79899 Other long term (current) drug therapy: Secondary | ICD-10-CM | POA: Insufficient documentation

## 2019-04-22 DIAGNOSIS — I10 Essential (primary) hypertension: Secondary | ICD-10-CM | POA: Insufficient documentation

## 2019-04-22 DIAGNOSIS — R197 Diarrhea, unspecified: Secondary | ICD-10-CM | POA: Insufficient documentation

## 2019-04-22 DIAGNOSIS — K219 Gastro-esophageal reflux disease without esophagitis: Secondary | ICD-10-CM | POA: Diagnosis not present

## 2019-04-22 DIAGNOSIS — E876 Hypokalemia: Secondary | ICD-10-CM

## 2019-04-22 DIAGNOSIS — Z8673 Personal history of transient ischemic attack (TIA), and cerebral infarction without residual deficits: Secondary | ICD-10-CM | POA: Insufficient documentation

## 2019-04-22 DIAGNOSIS — M79602 Pain in left arm: Secondary | ICD-10-CM | POA: Diagnosis present

## 2019-04-22 DIAGNOSIS — J449 Chronic obstructive pulmonary disease, unspecified: Secondary | ICD-10-CM | POA: Diagnosis not present

## 2019-04-22 DIAGNOSIS — Z21 Asymptomatic human immunodeficiency virus [HIV] infection status: Secondary | ICD-10-CM | POA: Insufficient documentation

## 2019-04-22 LAB — C DIFFICILE QUICK SCREEN W PCR REFLEX
C Diff antigen: NEGATIVE
C Diff interpretation: NOT DETECTED
C Diff toxin: NEGATIVE

## 2019-04-22 LAB — MAGNESIUM: Magnesium: 1.3 mg/dL — ABNORMAL LOW (ref 1.7–2.4)

## 2019-04-22 LAB — TROPONIN I (HIGH SENSITIVITY)
Troponin I (High Sensitivity): 4 ng/L (ref <18)
Troponin I (High Sensitivity): 6 ng/L (ref <18)

## 2019-04-22 LAB — COMPREHENSIVE METABOLIC PANEL
ALT: 13 U/L (ref 0–44)
AST: 15 U/L (ref 15–41)
Albumin: 4.2 g/dL (ref 3.5–5.0)
Alkaline Phosphatase: 101 U/L (ref 38–126)
Anion gap: 17 — ABNORMAL HIGH (ref 5–15)
BUN: 10 mg/dL (ref 6–20)
CO2: 25 mmol/L (ref 22–32)
Calcium: 8.7 mg/dL — ABNORMAL LOW (ref 8.9–10.3)
Chloride: 103 mmol/L (ref 98–111)
Creatinine, Ser: 0.67 mg/dL (ref 0.61–1.24)
GFR calc Af Amer: >60 mL/min (ref >60)
GFR calc non Af Amer: >60 mL/min (ref >60)
Glucose, Bld: 111 mg/dL — ABNORMAL HIGH (ref 70–99)
Potassium: 2.7 mmol/L — CL (ref 3.5–5.1)
Sodium: 145 mmol/L (ref 135–145)
Total Bilirubin: 0.5 mg/dL (ref 0.3–1.2)
Total Protein: 7.6 g/dL (ref 6.5–8.1)

## 2019-04-22 LAB — CBC
HCT: 40.8 % (ref 39.0–52.0)
Hemoglobin: 13.8 g/dL (ref 13.0–17.0)
MCH: 30.1 pg (ref 26.0–34.0)
MCHC: 33.8 g/dL (ref 30.0–36.0)
MCV: 89.1 fL (ref 80.0–100.0)
Platelets: 465 10*3/uL — ABNORMAL HIGH (ref 150–400)
RBC: 4.58 MIL/uL (ref 4.22–5.81)
RDW: 15.2 % (ref 11.5–15.5)
WBC: 6.7 10*3/uL (ref 4.0–10.5)
nRBC: 0 % (ref 0.0–0.2)

## 2019-04-22 LAB — LIPASE, BLOOD: Lipase: 34 U/L (ref 11–51)

## 2019-04-22 MED ORDER — ONDANSETRON HCL 4 MG/2ML IJ SOLN
4.0000 mg | Freq: Once | INTRAMUSCULAR | Status: AC
  Administered 2019-04-22: 19:00:00 4 mg via INTRAVENOUS
  Filled 2019-04-22: qty 2

## 2019-04-22 MED ORDER — POTASSIUM CHLORIDE 10 MEQ/100ML IV SOLN
10.0000 meq | INTRAVENOUS | Status: AC
  Administered 2019-04-22: 10 meq via INTRAVENOUS
  Filled 2019-04-22: qty 100

## 2019-04-22 MED ORDER — ACETAMINOPHEN 325 MG PO TABS
650.0000 mg | ORAL_TABLET | Freq: Once | ORAL | Status: AC
  Administered 2019-04-22: 650 mg via ORAL
  Filled 2019-04-22: qty 2

## 2019-04-22 MED ORDER — MAGNESIUM SULFATE 2 GM/50ML IV SOLN
2.0000 g | Freq: Once | INTRAVENOUS | Status: AC
  Administered 2019-04-22: 2 g via INTRAVENOUS
  Filled 2019-04-22: qty 50

## 2019-04-22 MED ORDER — ONDANSETRON HCL 4 MG PO TABS: 4.0000 mg | ORAL_TABLET | Freq: Every day | ORAL | 0 refills | Status: DC | PRN

## 2019-04-22 MED ORDER — ONDANSETRON HCL 4 MG/2ML IJ SOLN
4.0000 mg | Freq: Once | INTRAMUSCULAR | Status: AC
  Administered 2019-04-22: 4 mg via INTRAVENOUS
  Filled 2019-04-22: qty 2

## 2019-04-22 MED ORDER — SODIUM CHLORIDE 0.9 % IV BOLUS
1000.0000 mL | Freq: Once | INTRAVENOUS | Status: AC
  Administered 2019-04-22: 1000 mL via INTRAVENOUS

## 2019-04-22 MED ORDER — KETOROLAC TROMETHAMINE 30 MG/ML IJ SOLN
30.0000 mg | Freq: Once | INTRAMUSCULAR | Status: AC
  Administered 2019-04-22: 19:00:00 30 mg via INTRAVENOUS
  Filled 2019-04-22: qty 1

## 2019-04-22 MED ORDER — POTASSIUM CHLORIDE ER 10 MEQ PO TBCR: 20.0000 meq | EXTENDED_RELEASE_TABLET | Freq: Every day | ORAL | 0 refills | Status: DC

## 2019-04-22 MED ORDER — POTASSIUM CHLORIDE 10 MEQ/100ML IV SOLN
10.0000 meq | INTRAVENOUS | Status: AC
  Filled 2019-04-22 (×2): qty 100

## 2019-04-22 MED ORDER — POTASSIUM CHLORIDE 20 MEQ/15ML (10%) PO SOLN
40.0000 meq | Freq: Once | ORAL | Status: AC
  Administered 2019-04-22: 40 meq via ORAL
  Filled 2019-04-22: qty 30

## 2019-04-22 MED ORDER — POTASSIUM CHLORIDE CRYS ER 20 MEQ PO TBCR: 20.0000 meq | EXTENDED_RELEASE_TABLET | Freq: Every day | ORAL | Status: DC

## 2019-04-22 NOTE — ED Notes (Signed)
Pt resting on stretcher watching tv. Alert and calm at this time with iv fluids and medications infusing without difficulty. Pt given ginger ale and saltine crackers as requested by MD. Will continue to assess.

## 2019-04-22 NOTE — ED Notes (Signed)
Pt up to restroom with steady gait. Hat placed in bedside toilet to collect stool.

## 2019-04-22 NOTE — ED Triage Notes (Addendum)
Left arm pain for "months" and reflux that began today, emesis at home. Pt alert and oriented X4, cooperative, RR even and unlabored, color WNL. Pt in NAD. Denies NVD, states epigastric pain is still present.  Pt has been treated at Urgent Care for left arm pain and prescribed muscle relaxers which are not working by pt report. Pt drove himself here.

## 2019-04-22 NOTE — ED Notes (Signed)
Report off to andrea rn  

## 2019-04-22 NOTE — ED Notes (Signed)
Pt c/o nausea. Zofran given as prescribed.

## 2019-04-22 NOTE — ED Notes (Signed)
Pt resting on stretcher with lights off to enhance rest. States he is feeling better. No distress noted.

## 2019-04-22 NOTE — ED Notes (Signed)
Pt reports n/v/ d today.  Hx gastric reflux.   Denies abd pain.  Sx began this am.  Denies chest pain or sob.  Pt alert  Speech clear.  Iv started and meds given.

## 2019-04-22 NOTE — ED Provider Notes (Signed)
[facility] Emergency Department Provider Note  ____________________________________________   First MD Initiated Contact with Patient 04/22/19 1816     (approximate)  I have reviewed the triage vital signs and the nursing notes.   HISTORY  Chief Complaint Abdominal Pain and Arm Pain    HPI Mason Martin is a 54 y.o. male with HIV, COPD, hypertension who presents with left arm pain for months as well as reflux.  For the shoulder patient has had pain on his entire left arm for over a month that is constant, severe, nothing makes it better, nothing makes it worse.  He denies any C-spine tenderness.  Patient already had an MRI of his shoulder that shows mild supraspinatus tendinosis no rotator cuff tear.  She has been prescribed diclofenac as well as Flexeril but says he has no relief in his symptoms.  No swelling of the arm.  Patient reports he is also had steroid injections that have not helped as well.  Patient also has sudden onset of acid reflux around 2--3pm that was moderate, burning sensation, nothing made it better, nothing made it worse.  He has had a few episodes of vomiting today as well as nonbloody diarrhea.  This is been going on for the past 2 or 3 days.  Patient HIV is currently undetected.      Past Medical History:  Diagnosis Date   Clot    in intra abdominal vessels- off coumadin now   COPD (chronic obstructive pulmonary disease) ([facility])    GERD (gastroesophageal reflux disease)    History of meningitis    HIV disease ([facility])    on HAART, follows with ID at [facility]   Hypertension    Neck pain    with left arm radiculopathy   Pancreas cyst    Pancreatitis    chronic pancreatitis   TIA (transient ischemic attack)    Tobacco use     Patient Active Problem List   Diagnosis Date Noted   HTN (hypertension) 05/27/2017   GERD (gastroesophageal reflux disease) 05/27/2017   COPD (chronic obstructive pulmonary disease) ([facility])  05/27/2017   HIV (human immunodeficiency virus infection) ([facility]) 05/27/2017   Hypokalemia 05/27/2017   ETOH abuse 07/06/2015   Acute recurrent pancreatitis 07/01/2015   DVT (deep venous thrombosis) ([facility]) 10/25/2013   Pancreatitis 02/15/2013   Tobacco use disorder 02/15/2013   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 Authorizing Provider  acetaminophen (TYLENOL) 325 MG tablet Take by mouth. 01/18/15   [provider]  amLODipine (NORVASC) 5 MG tablet Take 5 mg by mouth daily.    [provider]  cetirizine (ZYRTEC) 10 MG tablet Take 10 mg by mouth daily. 04/27/15   [provider]  CREON 24000 units CPEP Take 24,000 Units by mouth 2 (two) times daily with a meal.  06/22/15   [provider]  cyclobenzaprine (FLEXERIL) 5 MG tablet Take 1 tablet (5 mg total) by mouth 3 (three) times daily as needed. 01/05/19   Menshew, Charlesetta Ivory, PA-C  diclofenac (VOLTAREN) 75 MG EC tablet Take 1 tablet (75 mg total) by mouth 2 (two) times daily. 03/15/19   Joni Reining, PA-C  enalapril (VASOTEC) 20 MG tablet Take 20 mg by mouth daily. 06/22/15   [provider]  gabapentin (NEURONTIN) 300 MG capsule Take 300  mg by mouth 2 (two) times daily.    [provider]  Hypertonic Nasal Wash (SINUS RINSE REFILL) PACK Frequency:BID   Dosage:0.0     Instructions:  Note:Dose: BID 09/03/12   [provider]  NEXIUM 40 MG capsule Take 40 mg by mouth daily before breakfast. 06/22/15   [provider]  Nutritional Supplements (ENSURE NUTRITION SHAKE) LIQD Take by mouth. 03/29/16   [provider]  PREZCOBIX 800-150 MG tablet Take 1 tablet by mouth daily.    [provider]  sodium fluoride (PREVIDENT) 1.1 % GEL dental gel Brush teeth with a pea-sized amount of the gel for 2  min and spit.  No rinsing. Do not eat or drink for 30 minutes after use. Use 2x/day. 06/09/18   [provider]  sucralfate (CARAFATE) 1 g tablet TAKE ONE TABLET BY MOUTH FOUR TIMES A DAY 07/27/18   [provider]  tazarotene (TAZORAC) 0.05 % cream Apply topically. 01/28/18   [provider]  TIVICAY 50 MG tablet Take 50 mg by mouth daily. 06/22/15   [provider]  triamcinolone ointment (KENALOG) 0.1 %  07/08/18   [provider]  triamcinolone ointment (KENALOG) 0.5 % Apply 1 application topically 2 (two) times daily. 01/28/18   Enid DerryWagner, Ashley, PA-C  urea (CARMOL) 20 % cream Apply topically to thick skin 1-2 times daily. 08/26/17   [provider]  VENTOLIN HFA 108 (90 Base) MCG/ACT inhaler Inhale 2 puffs into the lungs 4 (four) times daily as needed. For shortness of breath and/or wheezing. 06/22/15   [provider]    Allergies Sulfur  Family History  Problem Relation Age of Onset   Heart failure Father    Emphysema Father     Social History Social History   Tobacco Use   Smoking status: Current Every Day Smoker    Packs/day: 0.25    Types: Cigarettes   Smokeless tobacco: Never Used  Substance Use Topics   Alcohol use: Yes    Alcohol/week: 0.0 standard drinks   Drug use: No      Review of Systems Constitutional: No fever/chills Eyes: No visual changes. ENT: No sore throat. Cardiovascular: Positive chest pain Respiratory: Denies shortness of breath. Gastrointestinal: No abdominal pain.  Positive vomiting and diarrhea Genitourinary: Negative for dysuria. Musculoskeletal: Negative for back pain.  Positive chronic left shoulder pain Skin: Negative for rash. Neurological: Negative for headaches, focal weakness or numbness. All other ROS negative ____________________________________________   PHYSICAL EXAM:  VITAL SIGNS: ED Triage Vitals  Enc Vitals Group     BP 04/22/19 1629 (!) 137/96     Pulse  Rate 04/22/19 1629 94     Resp 04/22/19 1629 16     Temp 04/22/19 1629 99.5 F (37.5 C)     Temp Source 04/22/19 1629 Oral     SpO2 04/22/19 1629 100 %     Weight 04/22/19 1630 150 lb (68 kg)     Height 04/22/19 1630 5\' 4"  (1.626 m)     Head Circumference --      Peak Flow --      Pain Score 04/22/19 1629 8     Pain Loc --      Pain Edu? --      Excl. in GC? --     Constitutional: Alert and oriented. Well appearing and in no acute distress. Eyes: Conjunctivae are normal. EOMI. Head: Atraumatic. Nose: No congestion/rhinnorhea. Mouth/Throat: Mucous membranes are moist.   Neck: No stridor. Trachea  Midline. FROM Cardiovascular: Normal rate, regular rhythm. Grossly normal heart sounds.  Good peripheral circulation. Respiratory: Normal respiratory effort.  No retractions. Lungs CTAB. Gastrointestinal: Soft and nontender. No distention. No abdominal bruits.  Musculoskeletal: No lower extremity tenderness nor edema.  No joint effusions. Neurologic:  Normal speech and language. No gross focal neurologic deficits are appreciated.  Skin:  Skin is warm, dry and intact. No rash noted. Psychiatric: Mood and affect are normal. Speech and behavior are normal. GU: Deferred   ____________________________________________   LABS (all labs ordered are listed, but only abnormal results are displayed)  Labs Reviewed  COMPREHENSIVE METABOLIC PANEL - Abnormal; Notable for the following components:      Result Value   Potassium 2.7 (*)    Glucose, Bld 111 (*)    Calcium 8.7 (*)    Anion gap 17 (*)    All other components within normal limits  CBC - Abnormal; Notable for the following components:   Platelets 465 (*)    All other components within normal limits  MAGNESIUM - Abnormal; Notable for the following components:   Magnesium 1.3 (*)    All other components within normal limits  C DIFFICILE QUICK SCREEN W PCR REFLEX  GI PATHOGEN PANEL BY PCR, STOOL  LIPASE, BLOOD  URINALYSIS,  COMPLETE (UACMP) WITH MICROSCOPIC  TROPONIN I (HIGH SENSITIVITY)  TROPONIN I (HIGH SENSITIVITY)   ____________________________________________   ED ECG REPORT I, Concha Se, the attending physician, personally viewed and interpreted this ECG.  EKG is normal sinus rate of 76, no ST elevation, T wave inversion in aVL, normal intervals ____________________________________________  RADIOLOGY Vela Prose, personally viewed and evaluated these images (plain radiographs) as part of my medical decision making, as well as reviewing the written report by the radiologist.  ED MD interpretation:  No PNA  Official radiology report(s): Dg Chest 1 View  Result Date: 04/22/2019 CLINICAL DATA:  Left arm pain for "months" and reflux that began today, emesis at home. Pt alert and oriented X4, cooperative, RR even and unlabored, color WNL. Pt in NAD. Denies NVD, states epigastric pain is still present. EXAM: CHEST  1 VIEW COMPARISON:  10/18/2018 FINDINGS: Heart size is normal. The lungs are clear. No pulmonary edema. No pleural effusions. IMPRESSION: No active disease. Electronically Signed   By: Norva Pavlov M.D.   On: 04/22/2019 18:59    ____________________________________________   PROCEDURES  Procedure(s) performed (including Critical Care):  Procedures   ____________________________________________   INITIAL IMPRESSION / ASSESSMENT AND PLAN / ED COURSE  Mason Martin was evaluated in Emergency Department on 04/22/2019 for the symptoms described in the history of present illness. He was evaluated in the context of the global COVID-19 pandemic, which necessitated consideration that the patient might be at risk for infection with the SARS-CoV-2 virus that causes COVID-19. Institutional protocols and algorithms that pertain to the evaluation of patients at risk for COVID-19 are in a state of rapid change based on information released by regulatory bodies including the CDC and federal  and state organizations. These policies and algorithms were followed during the patient's care in the ED.    Patient is a 54 year old who has 2 separate complaints.  First he has his chronic pain of his left shoulder that he has had a MRI for and is followed by orthopedics.  He has no C-spine tenderness that suggest this being a cervical pathology.  Does not sound like a shooting pain down the arm.  He describes as  his entire arm hurting.  There is no swelling of the arm.  There is no redness to suggest septic joint.  For the vomiting and diarrhea it seems like the chest pain is most likely secondary to gastritis, GERD.  No crepitus on exam to suggest Boerhaave syndrome.  Will get cardiac markers to evaluate for ACS.  Will also get chest x-ray to evaluate for no widened mediastinum or free air although again I have very low suspicion given patient is very well-appearing.   No evidence pancreatitis.   K is 2.7 we will give 20 of IV K as well as 40 of p.o. solution. Magnesium was also low therefore will replete with 2 g. White count is normal  I had a lengthy discussion with patient that the shoulder plane given has been going on for months now he is being followed closely by orthopedic and there is no evidence of any acute pathology this is other than he will have to continue to follow-up with orthopedics for.  However I was in a focused my attention more so on the vomiting and diarrhea that he has been having for the past 2 days.  It sounds like a gastroenteritis and on reassessment patient continues to not have any abdominal pain.  We discussed admission for electrolyte abnormalities and symptomatic control versus correcting electrolytes here and if patient can p.o. challenge going home.  Patient felt comfortable with this plan.  Patient given some Zofran and will reassess later.  9:37 PM reevaluated patient.  Continues to not have any abdominal pain.  Says he felt like the potassium got stuck in his  throat but has otherwise been able to tolerate p.o.   Cardiac markers rule out for ACS.  Patient feels comfortable going home once electrolytes are repleted.  11:17 PM reevaluated patient continues to not have any abdominal pain.  Patient tolerating p.o.  Will give 20 MP okay for the next 3 days as well as Zofran to help with nausea.  Patient feels comfortable discharge home       ____________________________________________   FINAL CLINICAL IMPRESSION(S) / ED DIAGNOSES   Final diagnoses:  Hypokalemia  Hypomagnesemia      MEDICATIONS GIVEN DURING THIS VISIT:  Medications  potassium chloride 10 mEq in 100 mL IVPB (has no administration in time range)  potassium chloride 10 mEq in 100 mL IVPB (0 mEq Intravenous Stopped 04/22/19 2305)  sodium chloride 0.9 % bolus 1,000 mL (0 mLs Intravenous Stopped 04/22/19 2306)  potassium chloride 20 MEQ/15ML (10%) solution 40 mEq (40 mEq Oral Given 04/22/19 2103)  ondansetron (ZOFRAN) injection 4 mg (4 mg Intravenous Given 04/22/19 1855)  ketorolac (TORADOL) 30 MG/ML injection 30 mg (30 mg Intravenous Given 04/22/19 1856)  acetaminophen (TYLENOL) tablet 650 mg (650 mg Oral Given 04/22/19 1856)  magnesium sulfate IVPB 2 g 50 mL (0 g Intravenous Stopped 04/22/19 2141)  ondansetron (ZOFRAN) injection 4 mg (4 mg Intravenous Given 04/22/19 2143)     ED Discharge Orders         Ordered    ondansetron (ZOFRAN) 4 MG tablet  Daily PRN     04/22/19 2223    potassium chloride (KLOR-CON) 10 MEQ tablet  Daily     04/22/19 2224           Note:  This document was prepared using Dragon voice recognition software and may include unintentional dictation errors.   Vanessa Dougherty, MD 04/22/19 [phone]

## 2019-04-22 NOTE — Discharge Instructions (Addendum)
Your work-up was reassuring except for a low magnesium and low potassium.  This is most likely secondary to the vomiting and diarrhea.  We have repleted these here however he should have a recheck within the next week.  Urine to prescribe you a couple days of potassium as well.  Also given you some Zofran to help with some nausea.  You should return to the ER for worsening abdominal pain, vomiting or any other concerns.

## 2019-04-26 ENCOUNTER — Ambulatory Visit
Admit: 2019-04-26 | Discharge: 2019-04-27 | Payer: MEDICARE | Attending: Student in an Organized Health Care Education/Training Program | Primary: Student in an Organized Health Care Education/Training Program

## 2019-04-26 DIAGNOSIS — L309 Dermatitis, unspecified: Principal | ICD-10-CM

## 2019-04-26 DIAGNOSIS — B351 Tinea unguium: Principal | ICD-10-CM

## 2019-04-26 MED ORDER — TERBINAFINE HCL 250 MG TABLET
ORAL_TABLET | Freq: Every day | ORAL | 0 refills | 30 days | Status: CP
Start: 2019-04-26 — End: ?

## 2019-04-26 MED ORDER — CLOBETASOL 0.05 % TOPICAL OINTMENT
1 refills | 0 days | Status: CP
Start: 2019-04-26 — End: ?

## 2019-04-27 LAB — GI PATHOGEN PANEL BY PCR, STOOL

## 2019-04-29 MED ORDER — CETIRIZINE 10 MG TABLET
ORAL_TABLET | 0 refills | 0 days | Status: CP
Start: 2019-04-29 — End: ?

## 2019-05-07 DIAGNOSIS — R21 Rash and other nonspecific skin eruption: Principal | ICD-10-CM

## 2019-05-12 ENCOUNTER — Ambulatory Visit: Admit: 2019-05-12 | Discharge: 2019-05-13 | Payer: MEDICARE | Attending: Dermatology | Primary: Dermatology

## 2019-05-12 DIAGNOSIS — Z79899 Other long term (current) drug therapy: Principal | ICD-10-CM

## 2019-05-12 DIAGNOSIS — R21 Rash and other nonspecific skin eruption: Principal | ICD-10-CM

## 2019-05-12 MED ORDER — FOLIC ACID 1 MG TABLET
ORAL_TABLET | Freq: Every day | ORAL | 3 refills | 100.00000 days | Status: CP
Start: 2019-05-12 — End: 2020-05-11

## 2019-05-12 MED ORDER — CLOBETASOL 0.05 % TOPICAL OINTMENT
3 refills | 0 days | Status: CP
Start: 2019-05-12 — End: ?

## 2019-05-12 MED ORDER — METHOTREXATE SODIUM 2.5 MG TABLET
ORAL_TABLET | ORAL | 2 refills | 28 days | Status: CP
Start: 2019-05-12 — End: 2019-08-10

## 2019-06-09 ENCOUNTER — Ambulatory Visit: Admit: 2019-06-09 | Discharge: 2019-06-10 | Payer: MEDICARE | Attending: Dermatology | Primary: Dermatology

## 2019-06-09 DIAGNOSIS — R21 Rash and other nonspecific skin eruption: Principal | ICD-10-CM

## 2019-06-09 MED ORDER — HALOBETASOL PROPIONATE 0.05 % TOPICAL OINTMENT
2 refills | 0 days | Status: CP
Start: 2019-06-09 — End: ?

## 2019-06-09 MED ORDER — METHOTREXATE SODIUM 2.5 MG TABLET
ORAL_TABLET | 2 refills | 0 days | Status: CP
Start: 2019-06-09 — End: ?

## 2019-06-22 MED ORDER — AMLODIPINE 5 MG TABLET
ORAL_TABLET | 11 refills | 0 days | Status: CP
Start: 2019-06-22 — End: ?

## 2019-06-30 DIAGNOSIS — R21 Rash and other nonspecific skin eruption: Principal | ICD-10-CM

## 2019-07-05 MED ORDER — METHOTREXATE SODIUM 2.5 MG TABLET
ORAL_TABLET | 0 refills | 0 days | Status: CP
Start: 2019-07-05 — End: ?

## 2019-07-27 ENCOUNTER — Ambulatory Visit: Admit: 2019-07-27 | Discharge: 2019-07-28 | Payer: MEDICARE

## 2019-07-27 DIAGNOSIS — G8929 Other chronic pain: Principal | ICD-10-CM

## 2019-07-27 DIAGNOSIS — M546 Pain in thoracic spine: Principal | ICD-10-CM

## 2019-07-27 DIAGNOSIS — M5416 Radiculopathy, lumbar region: Principal | ICD-10-CM

## 2019-08-13 DIAGNOSIS — R21 Rash and other nonspecific skin eruption: Principal | ICD-10-CM

## 2019-08-13 MED ORDER — HALOBETASOL PROPIONATE 0.05 % TOPICAL OINTMENT
OPHTHALMIC | 0 refills | 0.00000 days | Status: CP
Start: 2019-08-13 — End: ?

## 2019-08-31 DIAGNOSIS — G8929 Other chronic pain: Principal | ICD-10-CM

## 2019-08-31 DIAGNOSIS — M25562 Pain in left knee: Principal | ICD-10-CM

## 2019-09-02 ENCOUNTER — Ambulatory Visit: Admit: 2019-09-02 | Discharge: 2019-09-03 | Payer: MEDICARE

## 2019-09-02 ENCOUNTER — Institutional Professional Consult (permissible substitution): Admit: 2019-09-02 | Discharge: 2019-09-03 | Payer: MEDICARE

## 2019-09-02 DIAGNOSIS — B20 Human immunodeficiency virus [HIV] disease: Principal | ICD-10-CM

## 2019-09-14 ENCOUNTER — Ambulatory Visit: Admit: 2019-09-14 | Discharge: 2019-09-15 | Payer: MEDICARE

## 2019-09-16 ENCOUNTER — Institutional Professional Consult (permissible substitution): Admit: 2019-09-16 | Discharge: 2019-09-17 | Payer: MEDICARE

## 2019-09-24 MED ORDER — ENALAPRIL MALEATE 20 MG TABLET
ORAL_TABLET | 11 refills | 0 days | Status: CP
Start: 2019-09-24 — End: ?

## 2019-09-24 MED ORDER — ESOMEPRAZOLE MAGNESIUM 40 MG CAPSULE,DELAYED RELEASE
ORAL_CAPSULE | 11 refills | 0 days | Status: CP
Start: 2019-09-24 — End: ?

## 2019-09-27 MED ORDER — CETIRIZINE 10 MG TABLET
ORAL_TABLET | 2 refills | 0 days | Status: CP
Start: 2019-09-27 — End: ?

## 2019-10-01 MED ORDER — GABAPENTIN 300 MG CAPSULE
ORAL_CAPSULE | 2 refills | 0 days | Status: CP
Start: 2019-10-01 — End: ?

## 2019-10-06 DIAGNOSIS — R21 Rash and other nonspecific skin eruption: Principal | ICD-10-CM

## 2019-10-08 MED ORDER — HALOBETASOL PROPIONATE 0.05 % TOPICAL OINTMENT
0 refills | 0 days | Status: CP
Start: 2019-10-08 — End: ?

## 2019-10-12 ENCOUNTER — Ambulatory Visit: Admit: 2019-10-12 | Discharge: 2019-10-13 | Payer: MEDICARE

## 2019-10-12 DIAGNOSIS — M5416 Radiculopathy, lumbar region: Principal | ICD-10-CM

## 2019-10-14 ENCOUNTER — Emergency Department
Admission: EM | Admit: 2019-10-14 | Discharge: 2019-10-14 | Disposition: A | Discharge status: Home / Self Care | Payer: Medicare Other | Attending: Emergency Medicine | Admitting: Emergency Medicine

## 2019-10-14 ENCOUNTER — Other Ambulatory Visit: Payer: Self-pay

## 2019-10-14 DIAGNOSIS — M79672 Pain in left foot: Secondary | ICD-10-CM | POA: Insufficient documentation

## 2019-10-14 DIAGNOSIS — Z5321 Procedure and treatment not carried out due to patient leaving prior to being seen by health care provider: Secondary | ICD-10-CM | POA: Insufficient documentation

## 2019-10-14 DIAGNOSIS — R2242 Localized swelling, mass and lump, left lower limb: Secondary | ICD-10-CM | POA: Insufficient documentation

## 2019-10-14 LAB — COMPREHENSIVE METABOLIC PANEL
ALT: 14 U/L (ref 0–44)
AST: 21 U/L (ref 15–41)
Albumin: 3.3 g/dL — ABNORMAL LOW (ref 3.5–5.0)
Alkaline Phosphatase: 83 U/L (ref 38–126)
Anion gap: 16 — ABNORMAL HIGH (ref 5–15)
BUN: 7 mg/dL (ref 6–20)
CO2: 22 mmol/L (ref 22–32)
Calcium: 8.7 mg/dL — ABNORMAL LOW (ref 8.9–10.3)
Chloride: 103 mmol/L (ref 98–111)
Creatinine, Ser: 0.95 mg/dL (ref 0.61–1.24)
GFR calc Af Amer: >60 mL/min (ref >60)
GFR calc non Af Amer: >60 mL/min (ref >60)
Glucose, Bld: 210 mg/dL — ABNORMAL HIGH (ref 70–99)
Potassium: 2.5 mmol/L — CL (ref 3.5–5.1)
Sodium: 141 mmol/L (ref 135–145)
Total Bilirubin: 0.7 mg/dL (ref 0.3–1.2)
Total Protein: 6.9 g/dL (ref 6.5–8.1)

## 2019-10-14 LAB — CBC WITH DIFFERENTIAL/PLATELET
Abs Immature Granulocytes: 0.08 10*3/uL — ABNORMAL HIGH (ref 0.00–0.07)
Basophils Absolute: 0.1 10*3/uL (ref 0.0–0.1)
Basophils Relative: 1 %
Eosinophils Absolute: 0 10*3/uL (ref 0.0–0.5)
Eosinophils Relative: 0 %
HCT: 38.9 % — ABNORMAL LOW (ref 39.0–52.0)
Hemoglobin: 12.7 g/dL — ABNORMAL LOW (ref 13.0–17.0)
Immature Granulocytes: 1 %
Lymphocytes Relative: 20 %
Lymphs Abs: 2.7 10*3/uL (ref 0.7–4.0)
MCH: 29.2 pg (ref 26.0–34.0)
MCHC: 32.6 g/dL (ref 30.0–36.0)
MCV: 89.4 fL (ref 80.0–100.0)
Monocytes Absolute: 1.2 10*3/uL — ABNORMAL HIGH (ref 0.1–1.0)
Monocytes Relative: 9 %
Neutro Abs: 9.1 10*3/uL — ABNORMAL HIGH (ref 1.7–7.7)
Neutrophils Relative %: 69 %
Platelets: 398 10*3/uL (ref 150–400)
RBC: 4.35 MIL/uL (ref 4.22–5.81)
RDW: 15.2 % (ref 11.5–15.5)
WBC: 13.1 10*3/uL — ABNORMAL HIGH (ref 4.0–10.5)
nRBC: 0.2 % (ref 0.0–0.2)

## 2019-10-14 MED ORDER — SODIUM CHLORIDE 0.9% FLUSH: 3.0000 mL | Freq: Once | INTRAVENOUS | Status: DC

## 2019-10-14 NOTE — ED Triage Notes (Signed)
Pt c/o left foot pain with swelling since yesterday, denies injury.

## 2019-10-15 ENCOUNTER — Ambulatory Visit
Admit: 2019-10-15 | Discharge: 2019-10-15 | Disposition: A | Discharge status: Home / Self Care | Payer: MEDICARE | Attending: Family

## 2019-10-15 ENCOUNTER — Emergency Department
Admit: 2019-10-15 | Discharge: 2019-10-15 | Disposition: A | Discharge status: Home / Self Care | Payer: MEDICARE | Attending: Family

## 2019-10-15 DIAGNOSIS — M25572 Pain in left ankle and joints of left foot: Principal | ICD-10-CM

## 2019-10-15 DIAGNOSIS — L03116 Cellulitis of left lower limb: Principal | ICD-10-CM

## 2019-10-15 MED ORDER — CEPHALEXIN 500 MG CAPSULE
ORAL_CAPSULE | Freq: Four times a day (QID) | ORAL | 0 refills | 10 days | Status: CP
Start: 2019-10-15 — End: 2019-10-25

## 2019-10-15 MED ORDER — TRAMADOL 50 MG TABLET
ORAL_TABLET | Freq: Four times a day (QID) | ORAL | 0 refills | 3 days | Status: CP | PRN
Start: 2019-10-15 — End: 2019-10-20

## 2019-10-20 ENCOUNTER — Encounter: Payer: Self-pay | Admitting: *Deleted

## 2019-10-20 ENCOUNTER — Emergency Department: Payer: Medicare Other

## 2019-10-20 ENCOUNTER — Emergency Department
Admission: EM | Admit: 2019-10-20 | Discharge: 2019-10-21 | Disposition: A | Discharge status: Home / Self Care | Payer: Medicare Other | Attending: Student in an Organized Health Care Education/Training Program | Admitting: Student in an Organized Health Care Education/Training Program

## 2019-10-20 ENCOUNTER — Other Ambulatory Visit: Payer: Self-pay

## 2019-10-20 DIAGNOSIS — R0789 Other chest pain: Secondary | ICD-10-CM | POA: Diagnosis not present

## 2019-10-20 DIAGNOSIS — F10929 Alcohol use, unspecified with intoxication, unspecified: Secondary | ICD-10-CM | POA: Insufficient documentation

## 2019-10-20 DIAGNOSIS — Y93I9 Activity, other involving external motion: Secondary | ICD-10-CM | POA: Diagnosis not present

## 2019-10-20 DIAGNOSIS — Y908 Blood alcohol level of 240 mg/100 ml or more: Secondary | ICD-10-CM | POA: Diagnosis not present

## 2019-10-20 DIAGNOSIS — F17210 Nicotine dependence, cigarettes, uncomplicated: Secondary | ICD-10-CM | POA: Insufficient documentation

## 2019-10-20 DIAGNOSIS — Z21 Asymptomatic human immunodeficiency virus [HIV] infection status: Secondary | ICD-10-CM | POA: Diagnosis not present

## 2019-10-20 DIAGNOSIS — R109 Unspecified abdominal pain: Secondary | ICD-10-CM | POA: Insufficient documentation

## 2019-10-20 DIAGNOSIS — R4781 Slurred speech: Secondary | ICD-10-CM | POA: Insufficient documentation

## 2019-10-20 DIAGNOSIS — Z79899 Other long term (current) drug therapy: Secondary | ICD-10-CM | POA: Diagnosis not present

## 2019-10-20 DIAGNOSIS — Y92410 Unspecified street and highway as the place of occurrence of the external cause: Secondary | ICD-10-CM | POA: Diagnosis not present

## 2019-10-20 DIAGNOSIS — T1490XA Injury, unspecified, initial encounter: Secondary | ICD-10-CM

## 2019-10-20 DIAGNOSIS — Y999 Unspecified external cause status: Secondary | ICD-10-CM | POA: Diagnosis not present

## 2019-10-20 DIAGNOSIS — J449 Chronic obstructive pulmonary disease, unspecified: Secondary | ICD-10-CM | POA: Diagnosis not present

## 2019-10-20 DIAGNOSIS — I10 Essential (primary) hypertension: Secondary | ICD-10-CM | POA: Diagnosis not present

## 2019-10-20 LAB — CBC WITH DIFFERENTIAL/PLATELET
Abs Immature Granulocytes: 0.08 10*3/uL — ABNORMAL HIGH (ref 0.00–0.07)
Basophils Absolute: 0.1 10*3/uL (ref 0.0–0.1)
Basophils Relative: 1 %
Eosinophils Absolute: 0.2 10*3/uL (ref 0.0–0.5)
Eosinophils Relative: 3 %
HCT: 41 % (ref 39.0–52.0)
Hemoglobin: 13.2 g/dL (ref 13.0–17.0)
Immature Granulocytes: 1 %
Lymphocytes Relative: 25 %
Lymphs Abs: 1.5 10*3/uL (ref 0.7–4.0)
MCH: 28.9 pg (ref 26.0–34.0)
MCHC: 32.2 g/dL (ref 30.0–36.0)
MCV: 89.7 fL (ref 80.0–100.0)
Monocytes Absolute: 0.4 10*3/uL (ref 0.1–1.0)
Monocytes Relative: 6 %
Neutro Abs: 3.8 10*3/uL (ref 1.7–7.7)
Neutrophils Relative %: 64 %
Platelets: 459 10*3/uL — ABNORMAL HIGH (ref 150–400)
RBC: 4.57 MIL/uL (ref 4.22–5.81)
RDW: 15.3 % (ref 11.5–15.5)
WBC: 6 10*3/uL (ref 4.0–10.5)
nRBC: 0 % (ref 0.0–0.2)

## 2019-10-20 LAB — COMPREHENSIVE METABOLIC PANEL
ALT: 15 U/L (ref 0–44)
AST: 19 U/L (ref 15–41)
Albumin: 3.5 g/dL (ref 3.5–5.0)
Alkaline Phosphatase: 92 U/L (ref 38–126)
Anion gap: 14 (ref 5–15)
BUN: 7 mg/dL (ref 6–20)
CO2: 22 mmol/L (ref 22–32)
Calcium: 8.9 mg/dL (ref 8.9–10.3)
Chloride: 110 mmol/L (ref 98–111)
Creatinine, Ser: 0.84 mg/dL (ref 0.61–1.24)
GFR calc Af Amer: >60 mL/min (ref >60)
GFR calc non Af Amer: >60 mL/min (ref >60)
Glucose, Bld: 109 mg/dL — ABNORMAL HIGH (ref 70–99)
Potassium: 3.1 mmol/L — ABNORMAL LOW (ref 3.5–5.1)
Sodium: 146 mmol/L — ABNORMAL HIGH (ref 135–145)
Total Bilirubin: 0.5 mg/dL (ref 0.3–1.2)
Total Protein: 7.4 g/dL (ref 6.5–8.1)

## 2019-10-20 LAB — ETHANOL: Alcohol, Ethyl (B): 246 mg/dL — ABNORMAL HIGH (ref <10)

## 2019-10-20 LAB — LIPASE, BLOOD: Lipase: 25 U/L (ref 11–51)

## 2019-10-20 MED ORDER — IOHEXOL 300 MG/ML  SOLN
100.0000 mL | Freq: Once | INTRAMUSCULAR | Status: AC | PRN
  Administered 2019-10-20: 100 mL via INTRAVENOUS

## 2019-10-20 MED ORDER — FENTANYL CITRATE (PF) 100 MCG/2ML IJ SOLN
50.0000 ug | INTRAMUSCULAR | Status: DC | PRN
  Administered 2019-10-20: 21:00:00 50 ug via INTRAVENOUS
  Filled 2019-10-20: qty 2
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
## 2019-10-20 NOTE — ED Notes (Signed)
Pt resting in bed in NAD. Pt continues to report pain all over if he wakes or is moved.

## 2019-10-20 NOTE — ED Notes (Signed)
Upon further assessment with a sternal rub pt reports, "my chest hurts, my arm hurts, everything hurts" and then fell back to sleep.
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
## 2019-10-21 DIAGNOSIS — F10929 Alcohol use, unspecified with intoxication, unspecified: Secondary | ICD-10-CM | POA: Diagnosis not present

## 2019-10-21 LAB — URINE DRUG SCREEN, QUALITATIVE (ARMC ONLY)
Amphetamines, Ur Screen: NOT DETECTED
Barbiturates, Ur Screen: NOT DETECTED
Benzodiazepine, Ur Scrn: NOT DETECTED
Cannabinoid 50 Ng, Ur ~~LOC~~: NOT DETECTED
Cocaine Metabolite,Ur ~~LOC~~: POSITIVE — AB
MDMA (Ecstasy)Ur Screen: NOT DETECTED
Methadone Scn, Ur: NOT DETECTED
Opiate, Ur Screen: NOT DETECTED
Phencyclidine (PCP) Ur S: NOT DETECTED
Tricyclic, Ur Screen: NOT DETECTED

## 2019-10-21 NOTE — ED Provider Notes (Signed)
I assumed care of the patient from Dr. Roxan Hockey at 11:00 PM.  Patient now able to ambulate without difficulty.  Patient was notified to obtain transportation to take him home.  Patient was able to do so   Darci Current, MD 10/21/19 [phone]

## 2019-10-21 NOTE — ED Notes (Signed)
Pt taken to lobby to meet ride. Pt is alert but drowsy and reports continued chest pain but verbalized understanding of home care.

## 2019-10-22 DIAGNOSIS — K863 Pseudocyst of pancreas: Principal | ICD-10-CM

## 2019-10-25 MED ORDER — CREON 24,000-76,000-120,000 UNIT CAPSULE,DELAYED RELEASE
ORAL_CAPSULE | 6 refills | 0 days | Status: CP
Start: 2019-10-25 — End: ?

## 2019-11-09 ENCOUNTER — Ambulatory Visit: Admit: 2019-11-09 | Discharge: 2019-11-10 | Payer: MEDICARE

## 2019-11-09 DIAGNOSIS — M545 Low back pain: Secondary | ICD-10-CM

## 2019-11-09 DIAGNOSIS — M62838 Other muscle spasm: Principal | ICD-10-CM

## 2019-11-09 DIAGNOSIS — G8929 Other chronic pain: Principal | ICD-10-CM

## 2019-11-09 MED ORDER — CYCLOBENZAPRINE 5 MG TABLET
ORAL_TABLET | Freq: Three times a day (TID) | ORAL | 0 refills | 30 days | Status: CP
Start: 2019-11-09 — End: ?

## 2019-11-23 ENCOUNTER — Other Ambulatory Visit: Payer: Self-pay

## 2019-11-23 ENCOUNTER — Encounter: Payer: Self-pay | Admitting: Emergency Medicine

## 2019-11-23 ENCOUNTER — Emergency Department: Payer: Medicare Other

## 2019-11-23 ENCOUNTER — Emergency Department
Admission: EM | Admit: 2019-11-23 | Discharge: 2019-11-23 | Disposition: A | Discharge status: Home / Self Care | Payer: Medicare Other | Attending: Emergency Medicine | Admitting: Emergency Medicine

## 2019-11-23 DIAGNOSIS — Y9389 Activity, other specified: Secondary | ICD-10-CM | POA: Diagnosis not present

## 2019-11-23 DIAGNOSIS — F17210 Nicotine dependence, cigarettes, uncomplicated: Secondary | ICD-10-CM | POA: Diagnosis not present

## 2019-11-23 DIAGNOSIS — Y929 Unspecified place or not applicable: Secondary | ICD-10-CM | POA: Insufficient documentation

## 2019-11-23 DIAGNOSIS — S2241XA Multiple fractures of ribs, right side, initial encounter for closed fracture: Secondary | ICD-10-CM

## 2019-11-23 DIAGNOSIS — J449 Chronic obstructive pulmonary disease, unspecified: Secondary | ICD-10-CM | POA: Insufficient documentation

## 2019-11-23 DIAGNOSIS — I10 Essential (primary) hypertension: Secondary | ICD-10-CM | POA: Insufficient documentation

## 2019-11-23 DIAGNOSIS — S299XXA Unspecified injury of thorax, initial encounter: Secondary | ICD-10-CM | POA: Diagnosis present

## 2019-11-23 DIAGNOSIS — Y999 Unspecified external cause status: Secondary | ICD-10-CM | POA: Insufficient documentation

## 2019-11-23 DIAGNOSIS — Z79899 Other long term (current) drug therapy: Secondary | ICD-10-CM | POA: Insufficient documentation

## 2019-11-23 MED ORDER — OXYCODONE-ACETAMINOPHEN 5-325 MG PO TABS: 1.0000 | ORAL_TABLET | ORAL | 0 refills | Status: DC | PRN

## 2019-11-23 MED ORDER — OXYCODONE-ACETAMINOPHEN 5-325 MG PO TABS
1.0000 | ORAL_TABLET | Freq: Once | ORAL | Status: AC
  Administered 2019-11-23: 1 via ORAL
  Filled 2019-11-23: qty 1
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
## 2019-11-23 NOTE — Discharge Instructions (Addendum)
Use the incentive spirometer at least 4 times daily.  If you are sitting and watching TV he should do it at each commercial. Use the pain medication as needed.  May also take over-the-counter ibuprofen. Return to the emergency department if you are worsening.

## 2019-12-02 ENCOUNTER — Ambulatory Visit: Admit: 2019-12-02 | Payer: MEDICARE

## 2019-12-05 DIAGNOSIS — M62838 Other muscle spasm: Principal | ICD-10-CM

## 2019-12-21 DIAGNOSIS — R21 Rash and other nonspecific skin eruption: Principal | ICD-10-CM

## 2019-12-24 MED ORDER — HALOBETASOL PROPIONATE 0.05 % TOPICAL OINTMENT
1 refills | 0 days | Status: CP
Start: 2019-12-24 — End: ?

## 2020-01-07 ENCOUNTER — Ambulatory Visit: Admit: 2020-01-07 | Payer: MEDICARE

## 2020-01-19 ENCOUNTER — Ambulatory Visit: Admit: 2020-01-19 | Payer: MEDICARE | Attending: Internal Medicine | Primary: Internal Medicine

## 2020-01-24 ENCOUNTER — Other Ambulatory Visit: Payer: Self-pay

## 2020-01-24 ENCOUNTER — Encounter: Payer: Self-pay | Admitting: Emergency Medicine

## 2020-01-24 DIAGNOSIS — K852 Alcohol induced acute pancreatitis without necrosis or infection: Secondary | ICD-10-CM | POA: Diagnosis not present

## 2020-01-24 DIAGNOSIS — R109 Unspecified abdominal pain: Secondary | ICD-10-CM | POA: Diagnosis present

## 2020-01-24 DIAGNOSIS — Z7951 Long term (current) use of inhaled steroids: Secondary | ICD-10-CM | POA: Insufficient documentation

## 2020-01-24 DIAGNOSIS — J449 Chronic obstructive pulmonary disease, unspecified: Secondary | ICD-10-CM | POA: Insufficient documentation

## 2020-01-24 DIAGNOSIS — R197 Diarrhea, unspecified: Secondary | ICD-10-CM | POA: Diagnosis not present

## 2020-01-24 DIAGNOSIS — Z79899 Other long term (current) drug therapy: Secondary | ICD-10-CM | POA: Diagnosis not present

## 2020-01-24 DIAGNOSIS — Z20822 Contact with and (suspected) exposure to covid-19: Secondary | ICD-10-CM | POA: Diagnosis not present

## 2020-01-24 DIAGNOSIS — R1110 Vomiting, unspecified: Secondary | ICD-10-CM | POA: Diagnosis not present

## 2020-01-24 DIAGNOSIS — F17210 Nicotine dependence, cigarettes, uncomplicated: Secondary | ICD-10-CM | POA: Insufficient documentation

## 2020-01-24 DIAGNOSIS — I10 Essential (primary) hypertension: Secondary | ICD-10-CM | POA: Diagnosis not present

## 2020-01-24 LAB — URINALYSIS, COMPLETE (UACMP) WITH MICROSCOPIC
Bacteria, UA: NONE SEEN
Bilirubin Urine: NEGATIVE
Glucose, UA: NEGATIVE mg/dL
Hgb urine dipstick: NEGATIVE
Ketones, ur: NEGATIVE mg/dL
Nitrite: NEGATIVE
Protein, ur: NEGATIVE mg/dL
Specific Gravity, Urine: 1.017 (ref 1.005–1.030)
pH: 6 (ref 5.0–8.0)

## 2020-01-24 LAB — COMPREHENSIVE METABOLIC PANEL
ALT: 14 U/L (ref 0–44)
AST: 22 U/L (ref 15–41)
Albumin: 3.7 g/dL (ref 3.5–5.0)
Alkaline Phosphatase: 94 U/L (ref 38–126)
Anion gap: 15 (ref 5–15)
BUN: 11 mg/dL (ref 6–20)
CO2: 23 mmol/L (ref 22–32)
Calcium: 8.7 mg/dL — ABNORMAL LOW (ref 8.9–10.3)
Chloride: 103 mmol/L (ref 98–111)
Creatinine, Ser: 0.83 mg/dL (ref 0.61–1.24)
GFR calc Af Amer: >60 mL/min (ref >60)
GFR calc non Af Amer: >60 mL/min (ref >60)
Glucose, Bld: 108 mg/dL — ABNORMAL HIGH (ref 70–99)
Potassium: 2.9 mmol/L — ABNORMAL LOW (ref 3.5–5.1)
Sodium: 141 mmol/L (ref 135–145)
Total Bilirubin: 1 mg/dL (ref 0.3–1.2)
Total Protein: 7.4 g/dL (ref 6.5–8.1)

## 2020-01-24 LAB — CBC
HCT: 40.5 % (ref 39.0–52.0)
Hemoglobin: 13.6 g/dL (ref 13.0–17.0)
MCH: 29.8 pg (ref 26.0–34.0)
MCHC: 33.6 g/dL (ref 30.0–36.0)
MCV: 88.6 fL (ref 80.0–100.0)
Platelets: 426 10*3/uL — ABNORMAL HIGH (ref 150–400)
RBC: 4.57 MIL/uL (ref 4.22–5.81)
RDW: 15.8 % — ABNORMAL HIGH (ref 11.5–15.5)
WBC: 17.7 10*3/uL — ABNORMAL HIGH (ref 4.0–10.5)
nRBC: 0 % (ref 0.0–0.2)

## 2020-01-24 LAB — TROPONIN I (HIGH SENSITIVITY): Troponin I (High Sensitivity): 14 ng/L (ref <18)

## 2020-01-24 LAB — LIPASE, BLOOD: Lipase: 33 U/L (ref 11–51)

## 2020-01-24 MED ORDER — SODIUM CHLORIDE 0.9% FLUSH: 3.0000 mL | Freq: Once | INTRAVENOUS | Status: DC

## 2020-01-24 MED ORDER — ONDANSETRON 4 MG PO TBDP
4.0000 mg | ORAL_TABLET | Freq: Once | ORAL | Status: AC | PRN
  Administered 2020-01-24: 4 mg via ORAL
  Filled 2020-01-24: qty 1

## 2020-01-24 MED ORDER — ACETAMINOPHEN 325 MG PO TABS
650.0000 mg | ORAL_TABLET | Freq: Once | ORAL | Status: AC
  Administered 2020-01-24: 650 mg via ORAL
  Filled 2020-01-24: qty 2

## 2020-01-24 NOTE — ED Triage Notes (Signed)
Pt coming from home via [facility], with complaints of abdominal pain, nausea, vomiting and diarrhea, pt reports he has history of pancreatitis and drank some alcohol yesterday. Pt reports had about 5 episodes of diarrhea and emesis. Pt talks in complete sentences no respiratory distress noted

## 2020-01-25 ENCOUNTER — Emergency Department
Admission: EM | Admit: 2020-01-25 | Discharge: 2020-01-25 | Disposition: A | Discharge status: Home / Self Care | Payer: Medicare Other | Attending: Emergency Medicine | Admitting: Emergency Medicine

## 2020-01-25 ENCOUNTER — Emergency Department: Payer: Medicare Other

## 2020-01-25 ENCOUNTER — Encounter: Payer: Self-pay | Admitting: Radiology

## 2020-01-25 DIAGNOSIS — K852 Alcohol induced acute pancreatitis without necrosis or infection: Secondary | ICD-10-CM

## 2020-01-25 LAB — SARS CORONAVIRUS 2 BY RT PCR (HOSPITAL ORDER, PERFORMED IN ~~LOC~~ HOSPITAL LAB): SARS Coronavirus 2: NEGATIVE

## 2020-01-25 MED ORDER — POTASSIUM CHLORIDE CRYS ER 20 MEQ PO TBCR
40.0000 meq | EXTENDED_RELEASE_TABLET | Freq: Once | ORAL | Status: AC
  Administered 2020-01-25: 40 meq via ORAL
  Filled 2020-01-25: qty 2

## 2020-01-25 MED ORDER — IOHEXOL 300 MG/ML  SOLN
100.0000 mL | Freq: Once | INTRAMUSCULAR | Status: AC | PRN
  Administered 2020-01-25: 100 mL via INTRAVENOUS

## 2020-01-25 MED ORDER — KETOROLAC TROMETHAMINE 30 MG/ML IJ SOLN
30.0000 mg | Freq: Once | INTRAMUSCULAR | Status: AC
  Administered 2020-01-25: 30 mg via INTRAVENOUS
  Filled 2020-01-25: qty 1

## 2020-01-25 MED ORDER — ONDANSETRON 4 MG PO TBDP: 4.0000 mg | ORAL_TABLET | Freq: Three times a day (TID) | ORAL | 0 refills | Status: AC | PRN

## 2020-01-25 NOTE — ED Provider Notes (Signed)
[facility] Emergency Department Provider Note  ____________________________________________   First MD Initiated Contact with Patient 01/25/20 0421     (approximate)  I have reviewed the triage vital signs and the nursing notes.   HISTORY  Chief Complaint Abdominal Pain   HPI Mason Martin is a 54 y.o. male with below list of abuse medical conditions including pancreatitis presents to the emergency department secondary to abdominal pain nausea vomiting diarrhea x1 day.  Patient does admit to EtOH ingestion yesterday.  Patient denies any fever.  No urinary symptoms.  Current pain score 8 out of 10 described as sharp and burning       Past Medical History:  Diagnosis Date   Clot    in intra abdominal vessels- off coumadin now   COPD (chronic obstructive pulmonary disease) (HCC)    GERD (gastroesophageal reflux disease)    History of meningitis    HIV disease (HCC)    on HAART, follows with ID at [facility]   Hypertension    Neck pain    with left arm radiculopathy   Pancreas cyst    Pancreatitis    chronic pancreatitis   TIA (transient ischemic attack)    Tobacco use     Patient Active Problem List   Diagnosis Date Noted   HTN (hypertension) 05/27/2017   GERD (gastroesophageal reflux disease) 05/27/2017   COPD (chronic obstructive pulmonary disease) (HCC) 05/27/2017   HIV (human immunodeficiency virus infection) (HCC) 05/27/2017   Hypokalemia 05/27/2017   ETOH abuse 07/06/2015   Acute recurrent pancreatitis 07/01/2015   DVT (deep venous thrombosis) (HCC) 10/25/2013   Pancreatitis 02/15/2013   Tobacco use disorder 02/15/2013   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 Authorizing Provider  acetaminophen (TYLENOL) 325 MG tablet Take by mouth. 01/18/15   [provider]  amLODipine (NORVASC) 5 MG tablet Take 5 mg by mouth daily.    [provider]  cetirizine (ZYRTEC) 10 MG tablet Take 10 mg by mouth daily. 04/27/15   [provider]  CREON 24000 units CPEP Take 24,000 Units by mouth 2 (two) times daily with a meal.  06/22/15   [provider]  cyclobenzaprine (FLEXERIL) 5 MG tablet Take 1 tablet (5 mg total) by mouth 3 (three) times daily as needed. 01/05/19   Menshew, Charlesetta Ivory, PA-C  diclofenac (VOLTAREN) 75 MG EC tablet Take 1 tablet (75 mg total) by mouth 2 (two) times daily. 03/15/19   Joni Reining, PA-C  enalapril (VASOTEC) 20 MG tablet Take 20 mg by mouth daily. 06/22/15   [provider]  gabapentin (NEURONTIN) 300 MG capsule Take 300 mg by mouth 2 (two) times daily.    [provider]  Hypertonic Nasal Wash (SINUS RINSE REFILL) PACK Frequency:BID   Dosage:0.0     Instructions:  Note:Dose: BID 09/03/12   [provider]  NEXIUM 40 MG capsule Take 40 mg by mouth daily before breakfast. 06/22/15   [provider]  Nutritional Supplements (ENSURE NUTRITION SHAKE) LIQD Take by mouth. 03/29/16   [provider]  ondansetron (ZOFRAN) 4 MG tablet Take 1 tablet (4 mg total) by mouth daily as needed for nausea or vomiting. 04/22/19 04/21/20  Concha Se, MD  oxyCODONE-acetaminophen (PERCOCET) 5-325 MG tablet Take 1 tablet by mouth every 4 (four) hours  as needed for severe pain. 11/23/19 11/22/20  Fisher, Roselyn BeringSusan W, PA-C  potassium chloride (KLOR-CON) 10 MEQ tablet Take 2 tablets (20 mEq total) by mouth daily for 3 days. 04/22/19 04/25/19  Concha SeFunke, Mary E, MD  PREZCOBIX 800-150 MG tablet Take 1 tablet by mouth daily.    [provider]  sodium fluoride (PREVIDENT) 1.1 % GEL dental gel Brush teeth with a pea-sized amount of the gel for 2 min and spit.  No rinsing. Do not eat or drink for 30 minutes after use. Use 2x/day. 06/09/18   [provider]  sucralfate  (CARAFATE) 1 g tablet TAKE ONE TABLET BY MOUTH FOUR TIMES A DAY 07/27/18   [provider]  tazarotene (TAZORAC) 0.05 % cream Apply topically. 01/28/18   [provider]  TIVICAY 50 MG tablet Take 50 mg by mouth daily. 06/22/15   [provider]  triamcinolone ointment (KENALOG) 0.1 %  07/08/18   [provider]  triamcinolone ointment (KENALOG) 0.5 % Apply 1 application topically 2 (two) times daily. 01/28/18   Enid DerryWagner, Ashley, PA-C  urea (CARMOL) 20 % cream Apply topically to thick skin 1-2 times daily. 08/26/17   [provider]  VENTOLIN HFA 108 (90 Base) MCG/ACT inhaler Inhale 2 puffs into the lungs 4 (four) times daily as needed. For shortness of breath and/or wheezing. 06/22/15   [provider]    Allergies Sulfa antibiotics and Sulfur  Family History  Problem Relation Age of Onset   Heart failure Father    Emphysema Father     Social History Social History   Tobacco Use   Smoking status: Current Every Day Smoker    Packs/day: 0.25    Types: Cigarettes   Smokeless tobacco: Never Used  Vaping Use   Vaping Use: Never assessed  Substance Use Topics   Alcohol use: Yes    Alcohol/week: 0.0 standard drinks   Drug use: No    Review of Systems Constitutional: No fever/chills Eyes: No visual changes. ENT: No sore throat. Cardiovascular: Denies chest pain. Respiratory: Denies shortness of breath. Gastrointestinal: Positive for abdominal pain nausea vomiting and diarrhea Genitourinary: Negative for dysuria. Musculoskeletal: Negative for neck pain.  Negative for back pain. Integumentary: Negative for rash. Neurological: Negative for headaches, focal weakness or numbness.   ____________________________________________   PHYSICAL EXAM:  VITAL SIGNS: ED Triage Vitals  Enc Vitals Group     BP 01/24/20 1958 (!) 129/84     Pulse Rate 01/24/20 1958 79     Resp 01/24/20 1958 20     Temp 01/24/20 1958 99.4 F (37.4 C)      Temp Source 01/24/20 1958 Oral     SpO2 01/24/20 1958 97 %     Weight 01/24/20 2000 63.5 kg (140 lb)     Height 01/24/20 2000 1.626 m (5\' 4" )     Head Circumference --      Peak Flow --      Pain Score 01/24/20 2000 9     Pain Loc --      Pain Edu? --      Excl. in GC? --     Constitutional: Alert and oriented.  Eyes: Conjunctivae are normal.  Head: Atraumatic. Mouth/Throat: Patient is wearing a mask. Neck: No stridor.  No meningeal signs.   Cardiovascular: Normal rate, regular rhythm. Good peripheral circulation. Grossly normal heart sounds. Respiratory: Normal respiratory effort.  No retractions. Gastrointestinal: Generalized tenderness to palpation worse in the epigastrium.. No distention.  Musculoskeletal: No lower  extremity tenderness nor edema. No gross deformities of extremities. Neurologic:  Normal speech and language. No gross focal neurologic deficits are appreciated.  Skin:  Skin is warm, dry and intact. Psychiatric: Mood and affect are normal. Speech and behavior are normal.  ____________________________________________   LABS (all labs ordered are listed, but only abnormal results are displayed)  Labs Reviewed  COMPREHENSIVE METABOLIC PANEL - Abnormal; Notable for the following components:      Result Value   Potassium 2.9 (*)    Glucose, Bld 108 (*)    Calcium 8.7 (*)    All other components within normal limits  CBC - Abnormal; Notable for the following components:   WBC 17.7 (*)    RDW 15.8 (*)    Platelets 426 (*)    All other components within normal limits  URINALYSIS, COMPLETE (UACMP) WITH MICROSCOPIC - Abnormal; Notable for the following components:   Color, Urine YELLOW (*)    APPearance HAZY (*)    Leukocytes,Ua TRACE (*)    All other components within normal limits  SARS CORONAVIRUS 2 BY RT PCR (HOSPITAL ORDER, PERFORMED IN [facility])  LIPASE, BLOOD  TROPONIN I (HIGH SENSITIVITY)  TROPONIN I (HIGH SENSITIVITY)    ____________________________________________  EKG  ED ECG REPORT I, Petal N Jacarie Pate, the attending physician, personally viewed and interpreted this ECG.   Date: 01/24/2020  EKG Time: 8:09 PM  Rate: 78  Rhythm: Normal sinus rhythm  Axis: Normal  Intervals: Normal  ST&T Change: None  ____________________________________________  RADIOLOGY I, [place] N Retal Tonkinson, personally viewed and evaluated these images (plain radiographs) as part of my medical decision making, as well as reviewing the written report by the radiologist.  ED MD interpretation: CT abdomen pelvis findings consistent with possible recurrent pancreatitis.  Official radiology report(s): CT ABDOMEN PELVIS W CONTRAST  Result Date: 01/25/2020 CLINICAL DATA:  Abdominal pain with nausea, vomiting, and diarrhea. EXAM: CT ABDOMEN AND PELVIS WITH CONTRAST TECHNIQUE: Multidetector CT imaging of the abdomen and pelvis was performed using the standard protocol following bolus administration of intravenous contrast. CONTRAST:  OMNIPAQUE IOHEXOL 300 MG/ML  SOLN COMPARISON:  10/20/2019 FINDINGS: Lower chest:  No contributory findings. Hepatobiliary: Hepatic steatosis. No focal lesion.No evidence of biliary obstruction or stone. Pancreas: Cluster calcifications at the uncinate process and head where there has been prior pancreatitis. A cystic density at the pancreatic groove measuring 13 mm, new from before. There is subtle stranding around the pancreatic head. Spleen: Tiny low-density in the inferior spleen, non worrisome. Adrenals/Urinary Tract: Negative adrenals. No hydronephrosis or stone. Unremarkable bladder. Stomach/Bowel: No obstruction. No appendicitis. 12 mm luminal lipoma in the proximal jejunum, nonobstructive. Vascular/Lymphatic: No acute vascular abnormality. Scattered atherosclerotic calcifications. No mass or adenopathy. Reproductive:No pathologic findings. Other: No ascites or pneumoperitoneum. Musculoskeletal:  Avascular necrosis of the femoral heads without subchondral collapse or fracture, see coronal reformats. IMPRESSION: Mild inflammation around the pancreatic head and uncinate process with 13 mm fluid collection at the pancreatic groove. Findings are likely related to recurrence of pancreatitis with pseudocyst, although peptic ulcer disease is a differential consideration. Electronically Signed   By: Marnee Spring M.D.   On: 01/25/2020 05:33    ____________________________________________   PROCEDURES   Procedure(s) performed (including Critical Care):  Procedures   ____________________________________________   INITIAL IMPRESSION / MDM / ASSESSMENT AND PLAN / ED COURSE  As part of my medical decision making, I reviewed the following data within the electronic MEDICAL RECORD NUMBER  54 year old male presented with above-stated  history and physical exam a differential diagnosis including but not limited to pancreatitis, diverticulitis, colitis.  Laboratory data revealed a white blood cell count of 17.7 and potassium of 2.9.  Patient given 40 mEq of potassium chloride orally.  Lipase also normal.  CT scan however was performed which revealed evidence of pancreatitis.  Spoke with the patient at length regarding the necessity of cessation of alcohol consumption.  Patient states that he is going to detox on Friday.  No active vomiting while in my care.  Patient was given IV Toradol for pain  ____________________________________________  FINAL CLINICAL IMPRESSION(S) / ED DIAGNOSES  Final diagnoses:  Alcohol-induced acute pancreatitis without infection or necrosis     MEDICATIONS GIVEN DURING THIS VISIT:  Medications  sodium chloride flush (NS) 0.9 % injection 3 mL (has no administration in time range)  ondansetron (ZOFRAN-ODT) disintegrating tablet 4 mg (4 mg Oral Given 01/24/20 2008)  acetaminophen (TYLENOL) tablet 650 mg (650 mg Oral Given 01/24/20 2008)  ketorolac (TORADOL) 30 MG/ML  injection 30 mg (30 mg Intravenous Given 01/25/20 0438)  iohexol (OMNIPAQUE) 300 MG/ML solution 100 mL (100 mLs Intravenous Contrast Given 01/25/20 0452)  potassium chloride SA (KLOR-CON) CR tablet 40 mEq (40 mEq Oral Given 01/25/20 0514)     ED Discharge Orders    None      *Please note:  HAYDN HUTSELL was evaluated in Emergency Department on 01/25/2020 for the symptoms described in the history of present illness. He was evaluated in the context of the global COVID-19 pandemic, which necessitated consideration that the patient might be at risk for infection with the SARS-CoV-2 virus that causes COVID-19. Institutional protocols and algorithms that pertain to the evaluation of patients at risk for COVID-19 are in a state of rapid change based on information released by regulatory bodies including the CDC and federal and state organizations. These policies and algorithms were followed during the patient's care in the ED.  Some ED evaluations and interventions may be delayed as a result of limited staffing during and after the pandemic.*  Note:  This document was prepared using Dragon voice recognition software and may include unintentional dictation errors.   Darci Current, MD 01/25/20 [phone]

## 2020-01-27 ENCOUNTER — Ambulatory Visit: Admit: 2020-01-27 | Discharge: 2020-01-28 | Payer: MEDICARE

## 2020-01-30 ENCOUNTER — Emergency Department
Admission: EM | Admit: 2020-01-30 | Discharge: 2020-01-31 | Disposition: A | Discharge status: Home / Self Care | Payer: Medicare Other | Attending: Emergency Medicine | Admitting: Emergency Medicine

## 2020-01-30 ENCOUNTER — Other Ambulatory Visit: Payer: Self-pay

## 2020-01-30 DIAGNOSIS — J449 Chronic obstructive pulmonary disease, unspecified: Secondary | ICD-10-CM | POA: Insufficient documentation

## 2020-01-30 DIAGNOSIS — I10 Essential (primary) hypertension: Secondary | ICD-10-CM | POA: Diagnosis not present

## 2020-01-30 DIAGNOSIS — F1290 Cannabis use, unspecified, uncomplicated: Secondary | ICD-10-CM | POA: Diagnosis not present

## 2020-01-30 DIAGNOSIS — Z79899 Other long term (current) drug therapy: Secondary | ICD-10-CM | POA: Insufficient documentation

## 2020-01-30 DIAGNOSIS — F10129 Alcohol abuse with intoxication, unspecified: Secondary | ICD-10-CM | POA: Diagnosis present

## 2020-01-30 DIAGNOSIS — F17210 Nicotine dependence, cigarettes, uncomplicated: Secondary | ICD-10-CM | POA: Insufficient documentation

## 2020-01-30 DIAGNOSIS — E876 Hypokalemia: Secondary | ICD-10-CM | POA: Diagnosis not present

## 2020-01-30 DIAGNOSIS — F1910 Other psychoactive substance abuse, uncomplicated: Secondary | ICD-10-CM | POA: Insufficient documentation

## 2020-01-30 DIAGNOSIS — F1490 Cocaine use, unspecified, uncomplicated: Secondary | ICD-10-CM

## 2020-01-30 DIAGNOSIS — F10920 Alcohol use, unspecified with intoxication, uncomplicated: Secondary | ICD-10-CM

## 2020-01-30 LAB — COMPREHENSIVE METABOLIC PANEL
ALT: 12 U/L (ref 0–44)
AST: 17 U/L (ref 15–41)
Albumin: 3.6 g/dL (ref 3.5–5.0)
Alkaline Phosphatase: 81 U/L (ref 38–126)
Anion gap: 13 (ref 5–15)
BUN: 11 mg/dL (ref 6–20)
CO2: 24 mmol/L (ref 22–32)
Calcium: 8.6 mg/dL — ABNORMAL LOW (ref 8.9–10.3)
Chloride: 105 mmol/L (ref 98–111)
Creatinine, Ser: 0.9 mg/dL (ref 0.61–1.24)
GFR calc Af Amer: >60 mL/min (ref >60)
GFR calc non Af Amer: >60 mL/min (ref >60)
Glucose, Bld: 111 mg/dL — ABNORMAL HIGH (ref 70–99)
Potassium: 2.6 mmol/L — CL (ref 3.5–5.1)
Sodium: 142 mmol/L (ref 135–145)
Total Bilirubin: 0.6 mg/dL (ref 0.3–1.2)
Total Protein: 7.1 g/dL (ref 6.5–8.1)

## 2020-01-30 LAB — ETHANOL: Alcohol, Ethyl (B): 191 mg/dL — ABNORMAL HIGH (ref <10)

## 2020-01-30 LAB — CBC
HCT: 36.9 % — ABNORMAL LOW (ref 39.0–52.0)
Hemoglobin: 12.4 g/dL — ABNORMAL LOW (ref 13.0–17.0)
MCH: 29.9 pg (ref 26.0–34.0)
MCHC: 33.6 g/dL (ref 30.0–36.0)
MCV: 88.9 fL (ref 80.0–100.0)
Platelets: 445 10*3/uL — ABNORMAL HIGH (ref 150–400)
RBC: 4.15 MIL/uL — ABNORMAL LOW (ref 4.22–5.81)
RDW: 15.9 % — ABNORMAL HIGH (ref 11.5–15.5)
WBC: 7.8 10*3/uL (ref 4.0–10.5)
nRBC: 0 % (ref 0.0–0.2)

## 2020-01-30 LAB — LIPASE, BLOOD: Lipase: 32 U/L (ref 11–51)

## 2020-01-30 MED ORDER — POTASSIUM CHLORIDE 10 MEQ/100ML IV SOLN
10.0000 meq | Freq: Once | INTRAVENOUS | Status: AC
  Administered 2020-01-31: 10 meq via INTRAVENOUS
  Filled 2020-01-30: qty 100

## 2020-01-30 MED ORDER — THIAMINE HCL 100 MG/ML IJ SOLN
Freq: Once | INTRAVENOUS | Status: AC
  Filled 2020-01-30: qty 1000

## 2020-01-30 NOTE — ED Provider Notes (Signed)
[facility] Emergency Department Provider Note   ____________________________________________   First MD Initiated Contact with Patient 01/30/20 2303     (approximate)  I have reviewed the triage vital signs and the nursing notes.   HISTORY  Chief Complaint Drug Problem and Alcohol Problem  Level V caveat: Limited by intoxication  HPI Mason Martin is a 54 y.o. male brought to the ED via police voluntarily seeking help from EtOH and crack cocaine.  Denies active SI/HI/AH/VH.  History of pancreatitis.  Patient sleeping heavily at the time of interview and examination and thus rest of history is limited.       Past Medical History:  Diagnosis Date  . 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 COPD (chronic obstructive pulmonary disease) (HCC)   . GERD (gastroesophageal reflux disease)   . History of meningitis   . HIV disease (HCC)    on HAART, follows with ID at [facility]  . Hypertension   . Neck pain    with left arm radiculopathy  . Pancreas cyst   . Pancreatitis    chronic pancreatitis  . TIA (transient ischemic attack)   . Tobacco use     Patient Active Problem List   Diagnosis Date Noted  . HTN (hypertension) 05/27/2017  . GERD (gastroesophageal reflux disease) 05/27/2017  . COPD (chronic obstructive pulmonary disease) (HCC) 05/27/2017  . HIV (human immunodeficiency virus infection) (HCC) 05/27/2017  . Hypokalemia 05/27/2017  . ETOH abuse 07/06/2015  . Acute recurrent pancreatitis 07/01/2015  . DVT (deep venous thrombosis) (HCC) 10/25/2013  . Pancreatitis 02/15/2013  . Tobacco use disorder 02/15/2013  . 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 ESOPHAGEAL ATRESIA REPAIR      Prior to Admission medications   Medication Sig Start Date End Date Taking? Authorizing Provider  acetaminophen (TYLENOL) 325 MG tablet Take by mouth. 01/18/15   [provider]  amLODipine  (NORVASC) 5 MG tablet Take 5 mg by mouth daily.    [provider]  cetirizine (ZYRTEC) 10 MG tablet Take 10 mg by mouth daily. 04/27/15   [provider]  CREON 24000 units CPEP Take 24,000 Units by mouth 2 (two) times daily with a meal.  06/22/15   [provider]  cyclobenzaprine (FLEXERIL) 5 MG tablet Take 1 tablet (5 mg total) by mouth 3 (three) times daily as needed. 01/05/19   Menshew, Charlesetta Ivory, PA-C  diclofenac (VOLTAREN) 75 MG EC tablet Take 1 tablet (75 mg total) by mouth 2 (two) times daily. 03/15/19   Joni Reining, PA-C  enalapril (VASOTEC) 20 MG tablet Take 20 mg by mouth daily. 06/22/15   [provider]  gabapentin (NEURONTIN) 300 MG capsule Take 300 mg by mouth 2 (two) times daily.    [provider]  Hypertonic Nasal Wash (SINUS RINSE REFILL) PACK Frequency:BID   Dosage:0.0     Instructions:  Note:Dose: BID 09/03/12   [provider]  NEXIUM 40 MG capsule Take 40 mg by mouth daily before breakfast. 06/22/15   [provider]  Nutritional Supplements (ENSURE NUTRITION SHAKE) LIQD Take by mouth. 03/29/16   [provider]  ondansetron (ZOFRAN ODT) 4 MG disintegrating tablet Take 1 tablet (4 mg total) by mouth every 8 (eight) hours as needed. 01/25/20   Darci Current, MD  ondansetron (ZOFRAN) 4 MG tablet Take 1 tablet (4 mg total) by mouth daily as needed  for nausea or vomiting. 04/22/19 04/21/20  Concha SeFunke, Mary E, MD  oxyCODONE-acetaminophen (PERCOCET) 5-325 MG tablet Take 1 tablet by mouth every 4 (four) hours as needed for severe pain. 11/23/19 11/22/20  Fisher, Roselyn BeringSusan W, PA-C  potassium chloride (KLOR-CON) 10 MEQ tablet Take 2 tablets (20 mEq total) by mouth daily for 3 days. 04/22/19 04/25/19  Concha SeFunke, Mary E, MD  PREZCOBIX 800-150 MG tablet Take 1 tablet by mouth daily.    [provider]  sodium fluoride (PREVIDENT) 1.1 % GEL dental gel Brush teeth with a pea-sized amount of the gel for 2 min and spit.   No rinsing. Do not eat or drink for 30 minutes after use. Use 2x/day. 06/09/18   [provider]  sucralfate (CARAFATE) 1 g tablet TAKE ONE TABLET BY MOUTH FOUR TIMES A DAY 07/27/18   [provider]  tazarotene (TAZORAC) 0.05 % cream Apply topically. 01/28/18   [provider]  TIVICAY 50 MG tablet Take 50 mg by mouth daily. 06/22/15   [provider]  triamcinolone ointment (KENALOG) 0.1 %  07/08/18   [provider]  triamcinolone ointment (KENALOG) 0.5 % Apply 1 application topically 2 (two) times daily. 01/28/18   Enid DerryWagner, Ashley, PA-C  urea (CARMOL) 20 % cream Apply topically to thick skin 1-2 times daily. 08/26/17   [provider]  VENTOLIN HFA 108 (90 Base) MCG/ACT inhaler Inhale 2 puffs into the lungs 4 (four) times daily as needed. For shortness of breath and/or wheezing. 06/22/15   [provider]    Allergies Sulfa antibiotics and Sulfur  Family History  Problem Relation Age of Onset  . Heart failure Father   . Emphysema Father     Social History Social History   Tobacco Use  . Smoking status: Current Every Day Smoker    Packs/day: 0.25    Types: Cigarettes  . Smokeless tobacco: Never Used  Vaping Use  . Vaping Use: Never assessed  Substance Use Topics  . Alcohol use: Yes    Alcohol/week: 0.0 standard drinks  . Drug use: No    Review of Systems  Constitutional: No fever/chills Eyes: No visual changes. ENT: No sore throat. Cardiovascular: Denies chest pain. Respiratory: Denies shortness of breath. Gastrointestinal: No abdominal pain.  No nausea, no vomiting.  No diarrhea.  No constipation. Genitourinary: Negative for dysuria. Musculoskeletal: Negative for back pain. Skin: Negative for rash. Neurological: Negative for headaches, focal weakness or numbness. Psychiatric:  Positive for alcohol and substance dependency.  ____________________________________________   PHYSICAL EXAM:  VITAL SIGNS: ED  Triage Vitals [01/30/20 2214]  Enc Vitals Group     BP 119/73     Pulse Rate 90     Resp 20     Temp 97.8 F (36.6 C)     Temp Source Oral     SpO2 97 %     Weight 140 lb (63.5 kg)     Height 5\' 4"  (1.626 m)     Head Circumference      Peak Flow      Pain Score 10     Pain Loc      Pain Edu?      Excl. in GC?     Constitutional: Asleep.  Disheveled appearing and in no acute distress. Eyes: Conjunctivae are normal. PERRL. EOMI. Head: Atraumatic. Nose: No congestion/rhinnorhea. Mouth/Throat: Mucous membranes are mildly dry.   Neck: No stridor.   Cardiovascular: Normal rate, regular rhythm. Grossly normal heart sounds.  Good peripheral circulation. Respiratory:  Normal respiratory effort.  No retractions. Lungs CTAB. Gastrointestinal: Soft and nontender to light or deep palpation.  Small ecchymotic area to lower abdomen which is nontender.  No distention. No abdominal bruits. No CVA tenderness. Musculoskeletal: No lower extremity tenderness nor edema.  No joint effusions. Neurologic: Intoxicated, difficult to arouse.  Mumbling speech and language. No gross focal neurologic deficits are appreciated.  Skin:  Skin is warm, dry and intact. No rash noted. Psychiatric: Unable to assess.  ____________________________________________   LABS (all labs ordered are listed, but only abnormal results are displayed)  Labs Reviewed  COMPREHENSIVE METABOLIC PANEL - Abnormal; Notable for the following components:      Result Value   Potassium 2.6 (*)    Glucose, Bld 111 (*)    Calcium 8.6 (*)    All other components within normal limits  ETHANOL - Abnormal; Notable for the following components:   Alcohol, Ethyl (B) 191 (*)    All other components within normal limits  CBC - Abnormal; Notable for the following components:   RBC 4.15 (*)    Hemoglobin 12.4 (*)    HCT 36.9 (*)    RDW 15.9 (*)    Platelets 445 (*)    All other components within normal limits  URINE DRUG SCREEN,  QUALITATIVE ([facility] ONLY) - Abnormal; Notable for the following components:   Cocaine Metabolite,Ur [place] POSITIVE (*)    All other components within normal limits  LIPASE, BLOOD   ____________________________________________  EKG  ED ECG REPORT I, Laury Huizar J, the attending physician, personally viewed and interpreted this ECG.   Date: 01/31/2020  EKG Time: 2211  Rate: 83  Rhythm: normal EKG, normal sinus rhythm  Axis: Normal  Intervals:none  ST&T Change: Nonspecific  ____________________________________________  RADIOLOGY  ED MD interpretation: None  Official radiology report(s): No results found.  ____________________________________________   PROCEDURES  Procedure(s) performed (including Critical Care):  .1-3 Lead EKG Interpretation Performed by: Irean Hong, MD Authorized by: Irean Hong, MD     Interpretation: normal     ECG rate:  85   ECG rate assessment: normal     Rhythm: sinus rhythm     Ectopy: none     Conduction: normal   Comments:     Patient placed on cardiac monitor to evaluate for arrhythmias     ____________________________________________   INITIAL IMPRESSION / ASSESSMENT AND PLAN / ED COURSE  As part of my medical decision making, I reviewed the following data within the electronic MEDICAL RECORD NUMBER Nursing notes reviewed and incorporated, Labs reviewed, EKG interpreted, Old chart reviewed, A consult was requested and obtained from this/these consultant(s) TTS and Notes from prior ED visits     Mason Martin was evaluated in Emergency Department on 01/31/2020 for the symptoms described in the history of present illness. He was evaluated in the context of the global COVID-19 pandemic, which necessitated consideration that the patient might be at risk for infection with the SARS-CoV-2 virus that causes COVID-19. Institutional protocols and algorithms that pertain to the evaluation of patients at risk for COVID-19 are in a state of rapid change  based on information released by regulatory bodies including the CDC and federal and state organizations. These policies and algorithms were followed during the patient's care in the ED.    54 year old male with alcohol and cocaine dependency requesting help with detox.  Above history taken from triage note when patient first arrived to the ED.  History currently limited given patient is  sleeping soundly.  Laboratory results remarkable for elevated EtOH, hypokalemia with history of the same. Will administer IV banana bag, IV potassium.  Will consult TTS to assist with detox.  Placed on CIWA protocol.  Clinical Course as of Jan 31 615  [facility] Jan 31, 2020  1791 Patient slept overnight, unable to participate in interview secondary to intoxication.  Plan for TTS to evaluate this morning to see if patient would be eligible for inpatient detox.    [JS]    Clinical Course User Index [JS] Irean Hong, MD     ____________________________________________   FINAL CLINICAL IMPRESSION(S) / ED DIAGNOSES  Final diagnoses:  Alcoholic intoxication without complication (HCC)  Substance abuse (HCC)  Hypokalemia  Cocaine use     ED Discharge Orders    None       Note:  This document was prepared using Dragon voice recognition software and may include unintentional dictation errors.   Irean Hong, MD 01/31/20 [phone]

## 2020-01-30 NOTE — ED Notes (Signed)
Pt requesting help to get off drugs and alcohol. Pt reports he has drank 2 pints today and last about a half hour before he arrived. Pt denies SI/HI AVH.

## 2020-01-30 NOTE — ED Triage Notes (Signed)
Pt arrived via [employer] officer with reports of drinking alcohol all day and smoking crack for the past 2 days.  Pt continuously stating in triage "please help me"  Pt denies any SI/HI at this time.   Pt has ecchymotic area to lower abdomen that he has been itching at as well.  Pt recently here for abdominal pain. Pt has hx of pancreatitis.

## 2020-01-30 NOTE — ED Notes (Signed)
Patient brought to ED by [facility].  Patient is here voluntarily seeking detox from alcohol and drugs.  Patient also reports left shoulder pain.

## 2020-01-31 ENCOUNTER — Emergency Department: Payer: Medicare Other

## 2020-01-31 ENCOUNTER — Ambulatory Visit: Admit: 2020-01-31 | Discharge: 2020-02-01 | Disposition: A | Discharge status: Home / Self Care | Payer: MEDICARE

## 2020-01-31 DIAGNOSIS — F10129 Alcohol abuse with intoxication, unspecified: Secondary | ICD-10-CM | POA: Diagnosis not present

## 2020-01-31 LAB — URINE DRUG SCREEN, QUALITATIVE (ARMC ONLY)
Amphetamines, Ur Screen: NOT DETECTED
Barbiturates, Ur Screen: NOT DETECTED
Benzodiazepine, Ur Scrn: NOT DETECTED
Cannabinoid 50 Ng, Ur ~~LOC~~: NOT DETECTED
Cocaine Metabolite,Ur ~~LOC~~: POSITIVE — AB
MDMA (Ecstasy)Ur Screen: NOT DETECTED
Methadone Scn, Ur: NOT DETECTED
Opiate, Ur Screen: NOT DETECTED
Phencyclidine (PCP) Ur S: NOT DETECTED
Tricyclic, Ur Screen: NOT DETECTED

## 2020-01-31 MED ORDER — LORAZEPAM 2 MG/ML IJ SOLN
1.0000 mg | Freq: Once | INTRAMUSCULAR | Status: AC
  Administered 2020-01-31: 1 mg via INTRAVENOUS
  Filled 2020-01-31: qty 1

## 2020-01-31 MED ORDER — ACETAMINOPHEN 500 MG PO TABS
1000.0000 mg | ORAL_TABLET | Freq: Once | ORAL | Status: AC
  Administered 2020-01-31: 1000 mg via ORAL
  Filled 2020-01-31: qty 2

## 2020-01-31 MED ORDER — LORAZEPAM 2 MG/ML IJ SOLN: 0.0000 mg | Freq: Four times a day (QID) | INTRAMUSCULAR | Status: DC

## 2020-01-31 MED ORDER — THIAMINE HCL 100 MG PO TABS
100.0000 mg | ORAL_TABLET | Freq: Every day | ORAL | Status: DC
  Administered 2020-01-31: 100 mg via ORAL
  Filled 2020-01-31: qty 1

## 2020-01-31 MED ORDER — NAPROXEN 500 MG PO TABS
500.0000 mg | ORAL_TABLET | Freq: Once | ORAL | Status: AC
  Administered 2020-01-31: 500 mg via ORAL
  Filled 2020-01-31: qty 1

## 2020-01-31 NOTE — ED Provider Notes (Signed)
Emergency Medicine Observation Re-evaluation Note  Mason Martin is a 54 y.o. male, seen on rounds today.  Pt initially presented to the ED for complaints of Drug Problem and Alcohol Problem Currently, the patient is resting comfortably bu easily awakens  Physical Exam  BP 119/73 (BP Location: Right Arm)   Pulse 90   Temp 97.8 F (36.6 C) (Oral)   Resp 20   Ht 5\' 4"  (1.626 m)   Wt 63.5 kg   SpO2 97%   BMI 24.03 kg/m  Physical Exam Vitals and nursing note reviewed.  HENT:     Head: Normocephalic and atraumatic.     Right Ear: External ear normal.     Left Ear: External ear normal.     Nose: Nose normal.  Eyes:     Conjunctiva/sclera: Conjunctivae normal.  Cardiovascular:     Rate and Rhythm: Normal rate and regular rhythm.     Pulses: Normal pulses.  Pulmonary:     Effort: No respiratory distress.  Abdominal:     General: There is no distension.  Skin:    Capillary Refill: Capillary refill takes less than 2 seconds.  Psychiatric:        Mood and Affect: Mood is depressed.     ED Course / MDM  EKG:  Clinical Course as of Jan 31 652  [facility] Jan 31, 2020  Feb 02, 2020 Patient slept overnight, unable to participate in interview secondary to intoxication.  Plan for TTS to evaluate this morning to see if patient would be eligible for inpatient detox.    [JS]    Clinical Course User Index [JS] 0175, MD   I have reviewed the labs performed to date as well as medications administered while in observation.  Recent changes in the last 24 hours include none. Plan  Current plan is for TTS consult for etoh rehab. Patient is not under full IVC at this time.   Irean Hong, MD 01/31/20 [phone]

## 2020-01-31 NOTE — ED Provider Notes (Signed)
Discussed with patient that unable to obtain placement at this time because of lack of behavioral social workers.  He is stable  has no symptoms of withdrawal.  He states that he would like to go to [facility] detox center, I have referred him to RTS as well.   Jene Every, MD 01/31/20 1524

## 2020-02-14 MED ORDER — CETIRIZINE 10 MG TABLET
ORAL_TABLET | 2 refills | 0 days | Status: CP
Start: 2020-02-14 — End: ?

## 2020-03-13 DIAGNOSIS — R21 Rash and other nonspecific skin eruption: Principal | ICD-10-CM

## 2020-03-14 MED ORDER — TRIAMCINOLONE ACETONIDE 0.1 % TOPICAL OINTMENT
INTRAMUSCULAR | 0 refills | 0.00000 days | Status: CP
Start: 2020-03-14 — End: ?

## 2020-03-20 ENCOUNTER — Ambulatory Visit
Admit: 2020-03-20 | Discharge: 2020-03-21 | Payer: MEDICARE | Attending: Infectious Disease | Primary: Infectious Disease

## 2020-03-20 DIAGNOSIS — E785 Hyperlipidemia, unspecified: Principal | ICD-10-CM

## 2020-03-20 DIAGNOSIS — B20 Human immunodeficiency virus [HIV] disease: Principal | ICD-10-CM

## 2020-03-20 MED ORDER — GABAPENTIN 300 MG CAPSULE
ORAL_CAPSULE | ORAL | 3 refills | 0.00000 days | Status: CP
Start: 2020-03-20 — End: ?
  Filled 2020-03-20: qty 90, 30d supply, fill #0

## 2020-03-20 MED ORDER — MIRTAZAPINE 30 MG TABLET
ORAL_TABLET | Freq: Every day | ORAL | 11 refills | 30 days | Status: CP
Start: 2020-03-20 — End: 2020-04-19
  Filled 2020-03-20: qty 30, 30d supply, fill #0

## 2020-03-20 MED FILL — MIRTAZAPINE 30 MG TABLET: 30 days supply | Qty: 30 | Fill #0 | Status: AC

## 2020-03-20 MED FILL — GABAPENTIN 300 MG CAPSULE: 30 days supply | Qty: 90 | Fill #0 | Status: AC

## 2020-03-21 DIAGNOSIS — R21 Rash and other nonspecific skin eruption: Principal | ICD-10-CM

## 2020-03-31 ENCOUNTER — Ambulatory Visit: Admit: 2020-03-31 | Discharge: 2020-04-01 | Payer: MEDICARE

## 2020-03-31 DIAGNOSIS — L03818 Cellulitis of other sites: Principal | ICD-10-CM

## 2020-03-31 DIAGNOSIS — E1165 Type 2 diabetes mellitus with hyperglycemia: Principal | ICD-10-CM

## 2020-03-31 DIAGNOSIS — R739 Hyperglycemia, unspecified: Principal | ICD-10-CM

## 2020-03-31 DIAGNOSIS — B372 Candidiasis of skin and nail: Principal | ICD-10-CM

## 2020-03-31 MED ORDER — NYSTATIN 100,000 UNIT/GRAM TOPICAL POWDER
TOPICAL | 0 refills | 0.00000 days | Status: CP
Start: 2020-03-31 — End: 2021-03-31
  Filled 2020-03-31: qty 60, 20d supply, fill #0

## 2020-03-31 MED ORDER — METFORMIN 500 MG TABLET
ORAL_TABLET | Freq: Two times a day (BID) | ORAL | 1 refills | 30.00000 days | Status: CP
Start: 2020-03-31 — End: 2020-06-29
  Filled 2020-03-31: qty 60, 30d supply, fill #0

## 2020-03-31 MED ORDER — DOXYCYCLINE HYCLATE 100 MG CAPSULE
ORAL_CAPSULE | Freq: Two times a day (BID) | ORAL | 0 refills | 10.00000 days | Status: CP
Start: 2020-03-31 — End: 2020-04-10
  Filled 2020-03-31: qty 20, 10d supply, fill #0

## 2020-03-31 MED FILL — NYSTOP 100,000 UNIT/GRAM TOPICAL POWDER: 20 days supply | Qty: 60 | Fill #0 | Status: AC

## 2020-03-31 MED FILL — DOXYCYCLINE HYCLATE 100 MG CAPSULE: 10 days supply | Qty: 20 | Fill #0 | Status: AC

## 2020-03-31 MED FILL — METFORMIN 500 MG TABLET: 30 days supply | Qty: 60 | Fill #0 | Status: AC

## 2020-04-02 ENCOUNTER — Emergency Department: Admit: 2020-04-02 | Discharge: 2020-04-03 | Disposition: A | Discharge status: Home / Self Care | Payer: MEDICARE

## 2020-04-02 ENCOUNTER — Ambulatory Visit: Admit: 2020-04-02 | Discharge: 2020-04-03 | Disposition: A | Discharge status: Home / Self Care | Payer: MEDICARE

## 2020-04-02 DIAGNOSIS — J189 Pneumonia, unspecified organism: Principal | ICD-10-CM

## 2020-04-02 MED ORDER — AZITHROMYCIN 250 MG TABLET
ORAL_TABLET | ORAL | 0 refills | 18.00000 days | Status: CP
Start: 2020-04-02 — End: 2020-04-20
  Filled 2020-04-03: qty 9, 9d supply, fill #0

## 2020-04-02 MED ORDER — AZITHROMYCIN 250 MG TABLET: tablet | 0 refills | 18 days | Status: AC

## 2020-04-02 MED ORDER — AMOXICILLIN 875 MG-POTASSIUM CLAVULANATE 125 MG TABLET
ORAL_TABLET | Freq: Two times a day (BID) | ORAL | 0 refills | 9.00000 days | Status: CP
Start: 2020-04-02 — End: 2020-04-02
  Filled 2020-04-03: qty 18, 9d supply, fill #0

## 2020-04-02 MED ORDER — AMOXICILLIN 875 MG-POTASSIUM CLAVULANATE 125 MG TABLET: 1 | tablet | Freq: Two times a day (BID) | 0 refills | 9 days | Status: AC

## 2020-04-03 ENCOUNTER — Ambulatory Visit
Admit: 2020-04-03 | Payer: MEDICARE | Attending: Addiction (Substance Use Disorder) | Primary: Addiction (Substance Use Disorder)

## 2020-04-03 MED FILL — AZITHROMYCIN 250 MG TABLET: 9 days supply | Qty: 9 | Fill #0 | Status: AC

## 2020-04-03 MED FILL — AMOXICILLIN 875 MG-POTASSIUM CLAVULANATE 125 MG TABLET: 9 days supply | Qty: 18 | Fill #0 | Status: AC

## 2020-04-04 ENCOUNTER — Ambulatory Visit: Admit: 2020-04-04 | Discharge: 2020-04-04 | Disposition: A | Discharge status: Home / Self Care | Payer: MEDICARE

## 2020-04-04 ENCOUNTER — Emergency Department: Admit: 2020-04-04 | Discharge: 2020-04-04 | Disposition: A | Discharge status: Home / Self Care | Payer: MEDICARE

## 2020-04-04 DIAGNOSIS — R531 Weakness: Principal | ICD-10-CM

## 2020-04-04 DIAGNOSIS — J189 Pneumonia, unspecified organism: Principal | ICD-10-CM

## 2020-04-04 DIAGNOSIS — R5383 Other fatigue: Principal | ICD-10-CM

## 2020-04-04 DIAGNOSIS — N10 Acute tubulo-interstitial nephritis: Principal | ICD-10-CM

## 2020-04-04 MED ORDER — CEFDINIR 300 MG CAPSULE
ORAL_CAPSULE | Freq: Every day | ORAL | 0 refills | 10.00000 days | Status: CP
Start: 2020-04-04 — End: 2020-04-14

## 2020-04-04 MED ORDER — CEFDINIR 300 MG CAPSULE: 600 mg | capsule | Freq: Every day | 0 refills | 10 days | Status: AC

## 2020-04-05 DIAGNOSIS — B20 Human immunodeficiency virus [HIV] disease: Principal | ICD-10-CM

## 2020-04-07 ENCOUNTER — Ambulatory Visit
Admit: 2020-04-07 | Discharge: 2020-04-08 | Payer: MEDICARE | Attending: Student in an Organized Health Care Education/Training Program | Primary: Student in an Organized Health Care Education/Training Program

## 2020-04-07 DIAGNOSIS — R42 Dizziness and giddiness: Principal | ICD-10-CM

## 2020-04-09 MED ORDER — TIVICAY 50 MG TABLET
ORAL_TABLET | 11 refills | 0 days | Status: CP
Start: 2020-04-09 — End: ?

## 2020-04-09 MED ORDER — PREZCOBIX 800 MG-150 MG TABLET
ORAL_TABLET | ORAL | 11 refills | 0.00000 days | Status: CP
Start: 2020-04-09 — End: ?

## 2020-04-10 ENCOUNTER — Ambulatory Visit
Admit: 2020-04-10 | Discharge: 2020-04-11 | Payer: MEDICARE | Attending: Infectious Disease | Primary: Infectious Disease

## 2020-04-10 DIAGNOSIS — B20 Human immunodeficiency virus [HIV] disease: Principal | ICD-10-CM

## 2020-04-13 ENCOUNTER — Ambulatory Visit
Admit: 2020-04-13 | Discharge: 2020-04-14 | Payer: MEDICARE | Attending: Physician Assistant | Primary: Physician Assistant

## 2020-04-13 DIAGNOSIS — L309 Dermatitis, unspecified: Principal | ICD-10-CM

## 2020-04-13 DIAGNOSIS — R21 Rash and other nonspecific skin eruption: Principal | ICD-10-CM

## 2020-04-13 MED ORDER — BETAMETHASONE, AUGMENTED 0.05 % TOPICAL OINTMENT
3 refills | 0.00000 days | Status: CP
Start: 2020-04-13 — End: ?
  Filled 2020-04-14: qty 50, 15d supply, fill #0

## 2020-04-14 MED FILL — BETAMETHASONE, AUGMENTED 0.05 % TOPICAL OINTMENT: 15 days supply | Qty: 50 | Fill #0 | Status: AC

## 2020-04-18 MED FILL — MIRTAZAPINE 30 MG TABLET: ORAL | 30 days supply | Qty: 30 | Fill #1

## 2020-04-18 MED FILL — MIRTAZAPINE 30 MG TABLET: 30 days supply | Qty: 30 | Fill #1 | Status: AC

## 2020-04-27 ENCOUNTER — Ambulatory Visit: Admit: 2020-04-27 | Discharge: 2020-04-28 | Payer: MEDICARE

## 2020-05-02 ENCOUNTER — Ambulatory Visit
Admit: 2020-05-02 | Discharge: 2020-05-03 | Payer: MEDICARE | Attending: Addiction (Substance Use Disorder) | Primary: Addiction (Substance Use Disorder)

## 2020-05-02 DIAGNOSIS — F1010 Alcohol abuse, uncomplicated: Principal | ICD-10-CM

## 2020-05-08 DIAGNOSIS — L309 Dermatitis, unspecified: Principal | ICD-10-CM

## 2020-05-08 MED ORDER — DUPILUMAB 300 MG/2 ML SUBCUTANEOUS PEN INJECTOR
SUBCUTANEOUS | 0 refills | 0.00000 days | Status: CP
Start: 2020-05-08 — End: 2020-06-12
  Filled 2020-05-15: qty 4, 14d supply, fill #0

## 2020-05-08 MED ORDER — DUPILUMAB 300 MG/2 ML SUBCUTANEOUS PEN INJECTOR: mL | 0 refills | 0 days | Status: AC

## 2020-05-10 DIAGNOSIS — L309 Dermatitis, unspecified: Principal | ICD-10-CM

## 2020-05-11 NOTE — Unmapped (Signed)
[facility] Specialty Medication Onboarding    Specialty Medication: Dupixent pens 300mg /79ml loading and maintenance doses  Prior Authorization: Approved   Financial Assistance: No - copay  <$25  Final Copay/Day Supply: $0 each / 14 days for loading dose and 28 days for maintenance dose    Insurance Restrictions: Yes - max 1 month supply     Notes to Pharmacist:     The triage team has completed the benefits investigation and has determined that the patient is able to fill this medication at [facility]. Please contact the patient to complete the onboarding or follow up with the prescribing physician as needed.

## 2020-05-12 MED ORDER — EMPTY CONTAINER
2 refills | 0 days
Start: 2020-05-12 — End: ?

## 2020-05-12 NOTE — Unmapped (Signed)
[facility]   Patient Onboarding/Medication Counseling    Timothy Tapia is a 54 y.o. male with atopic dermatitis who I am counseling today on initiation of therapy.  I am speaking to the patient.    Was a Nurse, learning disability used for this call? No    Verified patient's date of birth / HIPAA.    Specialty medication(s) to be sent: Inflammatory Disorders: Dupixent      Non-specialty medications/supplies to be sent: sharps kit      Medications not needed at this time: na         Dupixent (dupilumab)    Medication & Administration     Dosage: Atopic dermatitis: Inject 600mg  under the skin as a loading dose followed by 300mg  every 14 days thereafter    Administration:     Dupixent Pen  1. Gather all supplies needed for injection on a clean, flat working surface: medication syringe removed from packaging, alcohol swab, sharps container, etc.  2. Look at the medication label - look for correct medication, correct dose, and check the expiration date  3. Look at the medication - the liquid in the pen should appear clear and colorless to pale yellow  4. Lay the pen on a flat surface and allow it to warm up to room temperature for at least 45 minutes  5. Select injection site - you can use the front of your thigh or your belly (but not the area 2 inches around your belly button); if someone else is giving you the injection you can also use your upper arm in the skin covering your triceps muscle  6. Prepare injection site - wash your hands and clean the skin at the injection site with an alcohol swab and let it air dry, do not touch the injection site again before the injection  7. Hold the middle of the body of the pen and gently pull the needle safety cap straight out. Be careful not to bend the needle. Do not remove until immediately prior to injection  8. Press the pen down onto the injection site at a 90 degree angle.   9. You will hear a click as the injection starts, and then a second click when the injection is ALMOST done. Keep holding the pen against the skin for 5 more seconds after the second click.   10. Check that the pen is empty by looking in the viewing window - the yellow indicator bar should be stopped, and should fill the window.   11. Remove the pen from the skin by lifting straight up.   12. Dispose of the used pen immediately in your sharps disposal container  13. If you see any blood at the injection site, press a cotton ball or gauze on the site and maintain pressure until the bleeding stops, do not rub the injection site    Adherence/Missed dose instructions:  If a dose is missed, administer within 7 days from the missed dose and then resume the original schedule. If the missed dose is not administered within 7 days, you can either wait until the next dose on the original schedule or take your dose now and resume every 14 days from the new injection date. Do not use 2 doses at the same time or extra doses.      Goals of Therapy     -Reduce symptoms of pruritus and dermatitis  -Prevent exacerbations  -Minimize therapeutic risks  -Avoidance of long-term systemic and topical glucocorticoid use  -  Maintenance of effective psychosocial functioning    Side Effects & Monitoring Parameters     ??? Injection site reaction (redness, irritation, inflammation localized to the site of administration)  ??? Signs of a common cold ??? minor sore throat, runny or stuffy nose, etc.  ??? Recurrence of cold sores (herpes simplex)      The following side effects should be reported to the provider:  ??? Signs of a hypersensitivity reaction ??? rash; hives; itching; red, swollen, blistered, or peeling skin; wheezing; tightness in the chest or throat; difficulty breathing, swallowing, or talking; swelling of the mouth, face, lips, tongue, or throat; etc.  ??? Eye pain or irritation or any visual disturbances  ??? Shortness of breath or worsening of breathing      Contraindications, Warnings, & Precautions     ??? Have your bloodwork checked as you have been told by your prescriber   ??? Birth control pills and other hormone-based birth control may not work as well to prevent pregnancy  ??? Talk with your doctor if you are pregnant, planning to become pregnant, or breastfeeding  ??? Discuss the possible need for holding your dose(s) of Dupixent?? when a planned procedure is scheduled with the prescriber as it may delay healing/recovery timeline       Drug/Food Interactions     ??? Medication list reviewed in Epic. The patient was instructed to inform the care team before taking any new medications or supplements. No drug interactions identified.   ??? Talk with you prescriber or pharmacist before receiving any live vaccinations while taking this medication and after you stop taking it    Storage, Handling Precautions, & Disposal     ??? Store this medication in the refrigerator.  Do not freeze  ??? If needed, you may store at room temperature for up to 14 days  ??? Store in original packaging, protected from light  ??? Do not shake  ??? Dispose of used syringes in a sharps disposal container              Current Medications (including OTC/herbals), Comorbidities and Allergies     Current Outpatient Medications   Medication Sig Dispense Refill   ??? amLODIPine (NORVASC) 5 MG tablet TAKE ONE TABLET BY MOUTH DAILY 30 tablet 11   ??? betamethasone, augmented, (DIPROLENE) 0.05 % ointment Please specify directions, refills and quantity 1 g 0   ??? betamethasone, augmented, (DIPROLENE) 0.05 % ointment Apply twice daily to hands and then cover with gloves 50 g 3   ??? cetirizine (ZYRTEC) 10 MG tablet TAKE ONE TABLET BY MOUTH DAILY 30 tablet 2   ??? CREON 24,000-76,000 -120,000 unit CpDR delayed release capsule TAKE 1 CAPSULE BY MOUTH THREE TIMES DAILY WITH A MEAL 90 capsule 6   ??? dupilumab 300 mg/2 mL PnIj Inject the contents of 1 pen (300 mg) under the skin every fourteen (14) days. 4 mL 5   ??? dupilumab 300 mg/2 mL PnIj Inject the contents of 2 pens (600 mg) under the skin once for loading dose. 4 mL 0   ??? empty container Misc Use as directed to dispose of Dupixent pens. 1 each 2   ??? enalapril (VASOTEC) 20 MG tablet TAKE 1 TABLET BY MOUTH EVERY DAY 30 tablet 11   ??? esomeprazole (NEXIUM) 40 MG capsule TAKE 1 CAPSULE BY MOUTH EVERY MORNING BEFORE BREAKFAST. 30 capsule 11   ??? gabapentin (NEURONTIN) 300 MG capsule Take 3 capsules by mouth before bed. 270 capsule 3   ???  halobetasol (ULTRAVATE) 0.05 % ointment APPLY TO AFFECTED AREA TWICE A DAY AS NEEDED AVOID FACE AND FOLDS 30 g 1   ??? mirtazapine (REMERON) 30 MG tablet Take 1 tablet (30 mg total) by mouth daily. 30 tablet 11   ??? PREZCOBIX 800-150 mg-mg tablet TAKE ONE TABLET BY MOUTH DAILY 30 tablet 11   ??? TIVICAY 50 mg TABLET TAKE ONE TABLET BY MOUTH DAILY 30 tablet 11   ??? triamcinolone (KENALOG) 0.1 % ointment Apply to itchy spots 2 times daily. For elbows at night time, apply medicine and then cover with saran wrap. 454 g 0   ??? urea (CARMOL) 20 % cream Apply topically to thick skin 1-2 times daily. 85 g 11   ??? VENTOLIN HFA 90 mcg/actuation inhaler INHALE 2 PUFFS UP TO FOUR TIMES DAILY AS NEEDED 18 g 11     No current facility-administered medications for this visit.       Allergies   Allergen Reactions   ??? Sulfa (Sulfonamide Antibiotics) Hives   ??? Sulfur Hives       Patient Active Problem List   Diagnosis   ??? Chorioretinal scar   ??? Human immunodeficiency virus (HIV) disease (CMS-HCC)   ??? Pancreatitis   ??? Tobacco use disorder   ??? HTN (hypertension)   ??? Barrett esophagus   ??? Asthma   ??? Colitis   ??? DVT (deep venous thrombosis), IMV thrombus 10/07/13 provoked per hematology (52mo therapy)   ??? Pancreatic pseudocyst   ??? GI bleeding   ??? ETOH abuse   ??? COPD (chronic obstructive pulmonary disease) (CMS-HCC)   ??? Hypokalemia   ??? GERD (gastroesophageal reflux disease)   ??? Chronic bilateral low back pain       Reviewed and up to date in Epic.    Appropriateness of Therapy     Is medication and dose appropriate based on diagnosis? Yes    Prescription has been clinically reviewed: Yes    Baseline Quality of Life Assessment      How many days over the past month did your AD  keep you from your normal activities? For example, brushing your teeth or getting up in the morning. 0    Financial Information     Medication Assistance provided: Prior Authorization    Anticipated copay of $0 reviewed with patient. Verified delivery address.    Delivery Information     Scheduled delivery date: Monday, Nov 15 for load      Expected start date: Monday, Nov 15    Medication will be delivered via Same Day Courier to the prescription address in [place].  This shipment will not require a signature.      Explained the services we provide at [facility] and that each month we would call to set up refills.  Stressed importance of returning phone calls so that we could ensure they receive their medications in time each month.  Informed patient that we should be setting up refills 7-10 days prior to when they will run out of medication.  A pharmacist will reach out to perform a clinical assessment periodically.  Informed patient that a welcome packet and a drug information handout will be sent.      Patient verbalized understanding of the above information as well as how to contact the pharmacy at [phone] option 4 with any questions/concerns.  The pharmacy is open Monday through Friday 8:30am-4:30pm.  A pharmacist is available 24/7 via pager to answer any clinical questions they  may have.    Patient Specific Needs     - Does the patient have any physical, cognitive, or cultural barriers? No    - Patient prefers to have medications discussed with  Patient     - Is the patient or caregiver able to read and understand education materials at a high school level or above? Yes    - Patient's primary language is  English     - Is the patient high risk? No    - Does the patient require a Care Management Plan? No     - Does the patient require physician intervention or other additional services (i.e. nutrition, smoking cessation, social work)? No      Kennetha Pearman A Desiree [facility] Specialty Pharmacist

## 2020-05-15 MED FILL — DUPIXENT 300 MG/2 ML SUBCUTANEOUS PEN INJECTOR: 14 days supply | Qty: 4 | Fill #0 | Status: AC

## 2020-05-15 MED FILL — EMPTY CONTAINER: 120 days supply | Qty: 1 | Fill #0

## 2020-05-15 MED FILL — EMPTY CONTAINER: 120 days supply | Qty: 1 | Fill #0 | Status: AC

## 2020-05-15 NOTE — Unmapped (Signed)
[facility] Pharmacist Intervention    Type of intervention: review of injection technique, adverse effects    Medication: Dupixent    Problem/ intervention: [name] called in to the pharmacy to ensure he understood injection techniques, storage requirements, adverse effects, and time to benefit. I reviewed these pieces of information. He will plan to start medication today.    Follow up needed: na    Approximate time spent: 7 minutes    Zebulon Gantt A Desiree [facility] Specialty Pharmacist

## 2020-05-16 DIAGNOSIS — L309 Dermatitis, unspecified: Principal | ICD-10-CM

## 2020-05-16 MED ORDER — DUPIXENT 300 MG/2 ML SUBCUTANEOUS PEN INJECTOR
SUBCUTANEOUS | 0 refills | 14.00000 days | Status: CN
Start: 2020-05-16 — End: ?

## 2020-05-16 NOTE — Unmapped (Addendum)
Update 11/16 - the insurance is unable to provide an override for a replacement injection. I have advised Timothy Tapia to continue with maintenance dosing beginning on 11/29, and I've set up same day courier delivery on 11/29 for his Dupixent.  Ranae Palms        [facility] Pharmacist Intervention    Type of intervention: misfire    Medication: Dupixent    Problem: Timothy Tapia called to report he received his medication and he mis-fired the first pen. He did not receive any medication from the first pen.     Intervention: I walked him through the injection steps over the phone while he administered the second pen. This went as planned.     I'll reach out to our processing team to see if we can obtain an insurance override to send out another pen. I advised Timothy Tapia it would likely be OK clinically if the second part of his load was delayed/skipped due to insurance not allowing, but that we would try.    Follow up needed: Will set rph f/u for Wed and plan to call patient sooner if override obtained.    Approximate time spent: 10 minutes    Crystalle Popwell A Desiree Lucy [facility] Specialty Pharmacist

## 2020-05-19 DIAGNOSIS — K863 Pseudocyst of pancreas: Principal | ICD-10-CM

## 2020-05-19 MED ORDER — CREON 24,000-76,000-120,000 UNIT CAPSULE,DELAYED RELEASE
ORAL_CAPSULE | 6 refills | 0 days
Start: 2020-05-19 — End: ?

## 2020-05-19 MED ORDER — CETIRIZINE 10 MG TABLET
ORAL_TABLET | 2 refills | 0.00000 days | Status: CP
Start: 2020-05-19 — End: 2020-08-09

## 2020-05-19 MED FILL — MIRTAZAPINE 30 MG TABLET: ORAL | 30 days supply | Qty: 30 | Fill #2

## 2020-05-19 MED FILL — MIRTAZAPINE 30 MG TABLET: 30 days supply | Qty: 30 | Fill #2 | Status: AC

## 2020-05-19 NOTE — Unmapped (Signed)
Refill request: Creon. Last visit: 04/10/20. Last labs: 03/20/20.

## 2020-05-19 NOTE — Unmapped (Signed)
Substance Use Services     Duration of Intervention: 5 min     REASON/TYPE OF CONTACT: Face to Face, Patient came in to the clinic and asked for the SW.     ASSESSMENT: Patient came in to the clinic and said that he had messed up his injections for dermatology and needed guidance on how to resume. SW advised to contact dermatology clinic and patient said that he has been unable to get through. Patient requested if SW could help him get connected with Dermatology clinic.    Patient continues to report staying busy with working at [employer]. Paitient looking forward to thanksgiving and going to visit familiy in [place]. Patient continues to want to maintain sobriety and discussed different triggers that he will stay away from. Patient reports continuing to go to groups and working through the steps.       INTERVENTION: SW assessed needs and followed up with patient regarding ongoing sobriety.     PLAN:  SW will email dermatology provider to follow up with him about medication confusion.       Patient is being provided [employer] of last resort services for Relapse Prevention. Patient is either uninsured or a benefits investigation has shown the patients insurance does not provide this service to the degree needed by the patient at this time.     Dawnya Grams LCSW, LCAS  [facility] Social Work   Direct: [phone]

## 2020-05-22 NOTE — Unmapped (Signed)
Substance Use Services     Duration of Intervention: 5 min     REASON/TYPE OF CONTACT: E-mail, SW sent in basket to dermatology provider for follow up with medication needs.     ASSESSMENT: Provider, Sharene Butters, agreed to see patient and requested for scheduling team to reach out to patient.     INTERVENTION:  SW assisted with patient continuity of care.     PLAN:  No other needs at this time.       Patient is being provided [employer] of last resort services for Relapse Prevention. Patient is either uninsured or a benefits investigation has shown the patients insurance does not provide this service to the degree needed by the patient at this time.     Alyiah Ulloa LCSW, LCAS  [facility]   Direct: [phone]

## 2020-05-29 MED ORDER — VENTOLIN HFA 90 MCG/ACTUATION AEROSOL INHALER
11 refills | 0 days
Start: 2020-05-29 — End: ?

## 2020-05-29 MED FILL — DUPIXENT 300 MG/2 ML SUBCUTANEOUS PEN INJECTOR: 28 days supply | Qty: 4 | Fill #0 | Status: AC

## 2020-05-29 MED FILL — DUPIXENT 300 MG/2 ML SUBCUTANEOUS PEN INJECTOR: SUBCUTANEOUS | 28 days supply | Qty: 4 | Fill #0

## 2020-06-05 ENCOUNTER — Ambulatory Visit
Admit: 2020-06-05 | Discharge: 2020-06-06 | Payer: MEDICARE | Attending: Physician Assistant | Primary: Physician Assistant

## 2020-06-05 DIAGNOSIS — Z20828 Contact with and (suspected) exposure to other viral communicable diseases: Principal | ICD-10-CM

## 2020-06-05 DIAGNOSIS — Z20822 Contact with and (suspected) exposure to covid-19: Principal | ICD-10-CM

## 2020-06-05 NOTE — Unmapped (Signed)
Assessment     Timothy Tapia is a 54 y.o. male presenting to [facility] for COVID testing.     Problem List Items Addressed This Visit     None      Visit Diagnoses     Contact with and (suspected) exposure to covid-19    -  Primary    Relevant Orders    RAPID INFLUENZA/RSV/COVID PCR    Contact with and (suspected) exposure to other viral communicable diseases        Relevant Orders    RAPID INFLUENZA/RSV/COVID PCR          Plan     If no testing performed, pt counseled on routine care for respiratory illness.  If testing performed, COVID sent.  Patient directed to Home given findings during today's visit.    Subjective     Timothy Tapia is a 54 y.o. male who [facility] with complaints of the following:    Exposure History: In the last 21 days?     Have you traveled outside of [place]? No               Have you been in close contact with someone confirmed by a test to have COVID? (Close contact is within 6 feet for at least 10 minutes) No       Have you worked in a health care facility? No     Lived or worked facility like a nursing home, group home, or assisted living?    No         Are you scheduled to have surgery or a procedure in the next 3 days? No               Are you scheduled to receive cancer chemotherapy within the next 7 days?    No     Have you ever been tested before for COVID-19 with a swab of your nose? Yes: When: 0, Where: 0   Are you a healthcare worker being tested so to return to work No         Right now,  do you have any of the following that developed over the past 7 days (as stated by patient on intake form):    Subjective fever (felt feverish) No   Chills (especially repeated shaking chills) No   Severe fatigue (felt very tired) No   Muscle aches No   Runny nose Yes, how many days? 2   Sore throat No   Loss of taste or smell No   Cough (new onset or worsening of chronic cough) Yes, how many days? 2   Shortness of breath No   Nausea or vomiting No   Headache No   Abdominal Pain No   Diarrhea (3 or more loose stools in last 24 hours) No     History/Medical Conditions (as stated by patient on intake form):    Do you have any of the following:   Asthma or emphysema or COPD Yes   Cystic Fibrosis No   Diabetes No   High Blood Pressure  Yes   Cardiovascular Disease No   Chronic Kidney Disease No   Chronic Liver Disease No   Chronic blood disorder like Sickle Cell Disease  No   Weak immune system due to disease or medication Yes   Neurologic condition that limits movement  No   Developmental delay - Moderate to Severe  No   Recent (  within past 2 weeks) or current Pregnancy No   Morbid Obesity (>100 pounds over ideal weight) No   Current Smoker Yes   Former Smoker Yes       Objective     Given above, testing performed: Yes    Testing Performed:  Test Specimen Type Sent to   COVID-19  NP Swab [facility]       Scribe's Attestation: [place], [place] obtained and performed the history, physical exam and medical decision making elements that were entered into the chart.  Signed by Georgette Shell serving as Scribe, on 06/05/2020 10:55 AM    The documentation recorded by the scribe accurately reflects the service I personally performed and the decisions made by me. Analeigha Nauman F. Latanya Maudlin  June 05, 2020 10:58 AM

## 2020-06-06 NOTE — Unmapped (Addendum)
Patient called asking for Covid 19 and flu test results. Negative test results from 06/05/20 were provided. CMA asked patient how he was feeling, and if he had a fever Pt. States that he was a little congested, and had some coughing, but denies fever or body pain.Patient wanted to know if he could come in for an urgent care visit. CMA explained that their is nothing we could prescribe for his symptoms. Pt was advised to increase fluids, rest and take Vitamin C. Pt was encouraged to keep his up coming upcoming appointment on 06/12/20 with Dr. Servando Snare. If symptoms get worse to please call our office.

## 2020-06-12 ENCOUNTER — Ambulatory Visit: Admit: 2020-06-12 | Discharge: 2020-06-12 | Payer: MEDICARE

## 2020-06-12 ENCOUNTER — Ambulatory Visit
Admit: 2020-06-12 | Discharge: 2020-06-12 | Payer: MEDICARE | Attending: Infectious Disease | Primary: Infectious Disease

## 2020-06-12 DIAGNOSIS — E119 Type 2 diabetes mellitus without complications: Principal | ICD-10-CM

## 2020-06-12 DIAGNOSIS — B20 Human immunodeficiency virus [HIV] disease: Principal | ICD-10-CM

## 2020-06-12 LAB — HEMOGLOBIN A1C
ESTIMATED AVERAGE GLUCOSE: 140 mg/dL
HEMOGLOBIN A1C: 6.5 % — ABNORMAL HIGH (ref 4.8–5.6)

## 2020-06-12 NOTE — Unmapped (Signed)
PCP:  Lynnea Ferrier, MD    06/12/2020    This is a RETURN visit for this 54 y.o. male with the following diagnoses:    Patient Active Problem List    Diagnosis Date Noted   ??? Human immunodeficiency virus (HIV) disease (CMS-HCC) 02/04/1995   ??? Chronic bilateral low back pain 11/09/2019   ??? COPD (chronic obstructive pulmonary disease) (CMS-HCC) 05/27/2017   ??? Hypokalemia 05/27/2017   ??? GERD (gastroesophageal reflux disease) 05/27/2017   ??? Pancreatic pseudocyst 07/06/2015   ??? GI bleeding 07/06/2015   ??? ETOH abuse 07/06/2015   ??? DVT (deep venous thrombosis), IMV thrombus 10/07/13 provoked per hematology (43mo therapy) 10/25/2013   ??? Colitis 10/12/2013   ??? Pancreatitis 02/15/2013   ??? Tobacco use disorder 02/15/2013   ??? HTN (hypertension) 02/15/2013   ??? Barrett esophagus 02/15/2013   ??? Asthma 02/15/2013   ??? 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 CD4 is1000+  ?? Persistently undetectable HIV RNA but was 51 in sept  ?? Says adherent .  ?? On Prezcobix + Dolutegravir   ? 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 Tolerates current regimen.  ? Booster not desirable but no great alt option right now.    Substance Use  ?? 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 Was at [facility] but has inter-personal issues with another resident. Moved to [employer] and happier.   ?? Says he is still sober  ?? Will meet with SW who has been supportive      Back pain  ?? 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  Impingement the descending S1 nerve root  ?? [facility]  ?? When substance use issues settled can return  ?? Gabapentin 900 mg at bedtime is helping he says. He notices that his pain is worse and mobility tougher when he is off of this med.     Hx Pancreatitis - Now quiescent   ?? Old MRI shown that prior pancreatic pseudocyst resolved  ?? On Creon  ?? A1C was 8.7% last visit a couple of months agao  ?? Repeat today  ?? Denies polydipsia/phagia or frequent urination  ?? May not have tolerated metformin so will need alt   ??  Barrett's Espohagitis  ?? On PPI  ??  Smoking   ?? Smoking has increased as he has been dealing with addiction issues  ??  Mental Health  ?? See above  ?? On Remeron 30 mg at bedtime   ?? Watch for excessive somnolence given this + gabapentin.  ??  Health Maintenance   ?? Immunizations: UTD. HBV immune. HCV antibody positive but HCV viral load negative so not infected. C19 fully vaccinated. Refuses flu shot.   ?? Cancer Screening: Colonoscopy in 2017. Multiple polyps. Repeat. Barrett's so EGDs  ?? HTN: Controlled today. Will watch. On ACEi plus Ca++ bloocker.     Will follow up in 4 months.       Chief Complaint: Routine HIV follow-up    HPI:  Mr. Timothy Tapia returns for a routine appointment. See detailed A/P.     HIV-wise: Mostly stable CD4 and HIV RNA. Reports good adherence to ART.      ROS:   No fever chills, sweats, nausea, vomiting, diarrhea, rash, headache, joint pain. Neg for epigastric pain. Pos for LBP, depression  All other systems are negative.    ALLERGIES:  Sulfa (sulfonamide antibiotics) and Sulfur    MEDICATIONS:  Current Outpatient Medications   Medication Sig Dispense Refill   ??? amLODIPine (NORVASC) 5 MG tablet TAKE ONE TABLET BY MOUTH DAILY 30 tablet 11   ??? betamethasone, augmented, (DIPROLENE) 0.05 % ointment Apply twice daily to hands and then cover with gloves 50 g 3   ??? cetirizine (ZYRTEC) 10 MG tablet TAKE ONE TABLET BY MOUTH DAILY 30 tablet 2   ??? CREON 24,000-76,000 -120,000 unit CpDR delayed release capsule TAKE 1 CAPSULE BY MOUTH THREE TIMES DAILY WITH A MEAL 90 capsule 6   ??? dupilumab 300 mg/2 mL PnIj Inject the contents of 1 pen (300 mg) under the skin every fourteen (14) days. 4 mL 5   ??? empty container Misc Use as directed to dispose of Dupixent pens. 1 each 2   ??? enalapril (VASOTEC) 20 MG tablet TAKE 1 TABLET BY MOUTH EVERY DAY 30 tablet 11   ??? esomeprazole (NEXIUM) 40 MG capsule TAKE 1 CAPSULE BY MOUTH EVERY MORNING BEFORE BREAKFAST. 30 capsule 11   ??? gabapentin (NEURONTIN) 300 MG capsule Take 3 capsules by mouth before bed. 270 capsule 3   ??? halobetasol (ULTRAVATE) 0.05 % ointment APPLY TO AFFECTED AREA TWICE A DAY AS NEEDED AVOID FACE AND FOLDS 30 g 1   ??? mirtazapine (REMERON) 30 MG tablet Take 1 tablet (30 mg total) by mouth daily. 30 tablet 11   ??? PREZCOBIX 800-150 mg-mg tablet TAKE ONE TABLET BY MOUTH DAILY 30 tablet 11   ??? TIVICAY 50 mg TABLET TAKE ONE TABLET BY MOUTH DAILY 30 tablet 11   ??? betamethasone, augmented, (DIPROLENE) 0.05 % ointment Please specify directions, refills and quantity (Patient not taking: Reported on 06/12/2020) 1 g 0   ??? dupilumab 300 mg/2 mL PnIj Inject the contents of 2 pens (600 mg) under the skin once for loading dose. (Patient not taking: Reported on 06/12/2020) 4 mL 0   ??? triamcinolone (KENALOG) 0.1 % ointment Apply to itchy spots 2 times daily. For elbows at night time, apply medicine and then cover with saran wrap. (Patient not taking: Reported on 06/12/2020) 454 g 0   ??? urea (CARMOL) 20 % cream Apply topically to thick skin 1-2 times daily. (Patient not taking: Reported on 06/12/2020) 85 g 11   ??? VENTOLIN HFA 90 mcg/actuation inhaler INHALE 2 PUFFS UP TO FOUR TIMES DAILY AS NEEDED (Patient not taking: Reported on 06/12/2020) 18 g 11     No current facility-administered medications for this visit.     PE:  A&A. NAD. Chest CTA, Heart RRR. EXT w/o edema    DATA:    Results for orders placed or performed in visit on 06/05/20   RAPID INFLUENZA/RSV/COVID PCR    Specimen: Nasopharyngeal Swab   Result Value Ref Range    Influenza A Not Detected Not Detected    Influenza B Not Detected Not Detected    RSV Not Detected Not Detected    SARS-CoV-2 PCR Not Detected Not Detected     *Note: Due to a large number of results and/or encounters for the requested time period, some results have not been displayed. A complete set of results can be found in Results Review.        Creatinine   Date Value Ref Range Status   04/04/2020 0.90 0.70 - 1.30 mg/dL Final   02/72/5366 4.40 0.70 - 1.30 mg/dL Final   34/74/2595 6.38 0.70 -  1.30 mg/dL Final   16/04/9603 5.40 0.60 - 1.10 mg/dL Final   98/05/9146 8.29 0.76 - 1.27 mg/dL Final   56/21/3086 5.78 0.70 - 1.30 mg/dL Final   46/96/2952 8.41 0.70 - 1.30 mg/dL Final   32/44/0102 7.25 0.70 - 1.30 mg/dL Final       Triglycerides   Date Value Ref Range Status   03/20/2020 180 (H) 0 - 150 mg/dL Final   36/64/4034 742 1 - 149 mg/dL Final     HDL   Date Value Ref Range Status   03/20/2020 40 40 - 60 mg/dL Final   59/56/3875 79 (H) 40 - 59 mg/dL Final     LDL Calculated   Date Value Ref Range Status   03/20/2020 123 (H) 40 - 99 mg/dL Final     Comment:     NHLBI Recommended Ranges, LDL Cholesterol, for Adults (20+yrs) (ATPIII), mg/dL  Optimal              <643  Near Optimal        100-129  Borderline High     130-159  High                160-189  Very High            >=190  NHLBI Recommended Ranges, LDL Cholesterol, for Children (2-19 yrs), mg/dL  Desirable            <329  Borderline High     110-129  High                 >=130       LDL Cholesterol, Calculated   Date Value Ref Range Status   02/15/2013 100 mg/dL Final     Comment:     :  ADULTS (20 years or older)  Optimal         <100  Near Optimal    100-129  Borderline High 130-159  High            160-189  Very High       >=190  CHILDREN (2-19 years)  Desirable       <110  Borderline High 110-129  High            >/= 130       IMAGING STUDIES:  None

## 2020-06-15 NOTE — Unmapped (Deleted)
Dermatology Note     Assessment and Plan:      Eczematous dermatitis (?ACD) vs ?chronic actinic dermatitis vs drug reaction in a patient with a history of HIV, flaring today: failed MTX  - Patient has seen many different [facility] D[facility]atologists. Has diffuse dermatitis but also e/o chronic change on the posterior neck/ears which is reminiscent of chronic actinic dermatitis.   -  Biopsy 03/2019 w/ spongiosis, delicate parakeratosis, superficial dermal perivascular lymphocytic infiltrae  -Continue betamethasone ointment twice daily to thicker itchy areas until smooth  - Continue triamcinolone ointment to thinner active areas until smooth  -Moisturize with ONLY vaseline otherwise  -Patient is on a few medications which could be contributing although timing would suggest otherwise - could consider trials off of medications if no improvement with above.    There are no diagnoses linked to this encounter.    The patient was advised to call for an appointment should any new, changing, or symptomatic lesions develop.     RTC: No follow-ups on file. or sooner as needed   _________________________________________________________________      Chief Complaint     Follow-up of dermatitis    HPI     Phi L. Hollingsworth is a 54 y.o. male who presents as a returning patient (last seen by Sharene Butters on 04/13/20) to [facility] for follow up of dermatitis. At last visit, he had a punch biopsy taken from his lateral right thigh that was remarkable for epidermal acanthosis with focal spongiosis and prominent papillary dermal fibrosis.    ***  Location:   Duration:   Treatment/Modifying factors:   Associated signs/ Symptoms:      The patient denies any other new or changing lesions or areas of concern.     Pertinent Past Medical History     HIV (last CD4 864, viral load undetectable) - on prezcobix + dolutegravir  History of ETOH abuse and cocaine abuse  Tinea pedis which resolved with lamisil    No history of skin cancer    Family History: Negative for melanoma    Past Medical History, Family History, Social History, Medication List, Allergies, and Problem List were reviewed in the rooming section of Epic.     ROS: Other than symptoms mentioned in the HPI, no fevers, chills, or other skin complaints    Physical Examination     GENERAL: Well-appearing male in no acute distress, resting comfortably.  NEURO: Alert and oriented, answers questions appropriately  PSYCH: Normal mood and affect  {PE extent:75514}  {PE list:75421}  ***    All areas not commented on are within normal limits or unremarkable    ***    (Approved Template 03/13/2020)

## 2020-06-19 NOTE — Unmapped (Signed)
Substance Use Services     Duration of Intervention: 10 min     REASON/TYPE OF CONTACT: Phone, patient called to check in and inquire about recent labs.     ASSESSMENT: Patient wanted to check on recent outcomes of glucose labs. SW looked up most recent labs and reported that sugar levels seemed to be okay but that she would message doctor and nurse so they can answer any patient questions.     Patient also reports ongoing sobriety and staying very busy with work at [employer] especially with proximity to the holidays. Patient likes being busy and says that having a schedule helps him stay sober.     INTERVENTION:  SW assisted with connecting patient to provider    PLAN:  SW messaged provider and nurse for interpretation of labs.       Patient is being provided [employer] of last resort services for Relapse Prevention. Patient is either uninsured or a benefits investigation has shown the patients insurance does not provide this service to the degree needed by the patient at this time.     Cassady Stanczak LCSW, LCAS  [facility] Social Work   Direct: [phone]

## 2020-06-20 MED ORDER — GABAPENTIN 300 MG CAPSULE
ORAL_CAPSULE | 2 refills | 0 days
Start: 2020-06-20 — End: ?

## 2020-06-20 MED FILL — MIRTAZAPINE 30 MG TABLET: ORAL | 30 days supply | Qty: 30 | Fill #3

## 2020-06-20 MED FILL — MIRTAZAPINE 30 MG TABLET: 30 days supply | Qty: 30 | Fill #3 | Status: AC

## 2020-06-20 NOTE — Unmapped (Signed)
Substance Use Services     Duration of Intervention: 15 min     REASON/TYPE OF CONTACT: Face to Face, patient came in to clinic for check in and assistance accessing mychart    ASSESSMENT: Patient reports inability to access mychart. He called my support for my chart and was unable to resolve issue. Patient and social worker worked to Regulatory affairs officer and accessed [employer]. Patient also discussed ongoing sobriety and said that he has been sober for 5 months.  The longest time he has maintained sobriety was 4.5 months. Patient continues to stay motivated to maintain sobriety.  SW and patient discussed triggers as he approaches holidays and how to maintain sobriety. Patient reports that he does not want to go back the same way he was living and the long term consequences help him stay focused on his goals. Patient informed that social worker will not be here next week and that she would return in the new year.     INTERVENTION:  SW assessed current recovery process and assisted patient with acccessing healthcare treatment resources.     PLAN:  No further needs.       Patient is being provided [employer] of last resort services for Relapse Prevention. Patient is either uninsured or a benefits investigation has shown the patients insurance does not provide this service to the degree needed by the patient at this time.     Timothy Hersh LCSW, LCAS  [facility] Social Work   Direct: [phone]

## 2020-06-20 NOTE — Unmapped (Signed)
Timothy Tapia reports potentially slight improvement since starting the Dupixent. He reports he still has a lot of itching, but the steroid creams are helpful for this.    I encouraged him to re-schedule his dermatology visit to assess for Dupixent efficacy. He will plan to call today.       [facility] Clinical Assessment & Refill Coordination Note    Timothy Tapia, DOB: [birth year]  Phone: [phone] (home) [phone] (work)    All above HIPAA information was verified with patient.     Was a Nurse, learning disability used for this call? No    Specialty Medication(s):   Inflammatory Disorders: Dupixent     Current Outpatient Medications   Medication Sig Dispense Refill   ??? amLODIPine (NORVASC) 5 MG tablet TAKE ONE TABLET BY MOUTH DAILY 30 tablet 11   ??? betamethasone, augmented, (DIPROLENE) 0.05 % ointment Apply twice daily to hands and then cover with gloves 50 g 3   ??? cetirizine (ZYRTEC) 10 MG tablet TAKE ONE TABLET BY MOUTH DAILY 30 tablet 2   ??? CREON 24,000-76,000 -120,000 unit CpDR delayed release capsule TAKE 1 CAPSULE BY MOUTH THREE TIMES DAILY WITH A MEAL 90 capsule 6   ??? dupilumab 300 mg/2 mL PnIj Inject the contents of 1 pen (300 mg) under the skin every fourteen (14) days. 4 mL 5   ??? empty container Misc Use as directed to dispose of Dupixent pens. 1 each 2   ??? enalapril (VASOTEC) 20 MG tablet TAKE 1 TABLET BY MOUTH EVERY DAY 30 tablet 11   ??? esomeprazole (NEXIUM) 40 MG capsule TAKE 1 CAPSULE BY MOUTH EVERY MORNING BEFORE BREAKFAST. 30 capsule 11   ??? gabapentin (NEURONTIN) 300 MG capsule Take 3 capsules by mouth before bed. 270 capsule 3   ??? halobetasol (ULTRAVATE) 0.05 % ointment APPLY TO AFFECTED AREA TWICE A DAY AS NEEDED AVOID FACE AND FOLDS 30 g 1   ??? mirtazapine (REMERON) 30 MG tablet Take 1 tablet (30 mg total) by mouth daily. 30 tablet 11   ??? PREZCOBIX 800-150 mg-mg tablet TAKE ONE TABLET BY MOUTH DAILY 30 tablet 11   ??? TIVICAY 50 mg TABLET TAKE ONE TABLET BY MOUTH DAILY 30 tablet 11 ??? triamcinolone (KENALOG) 0.1 % ointment Apply to itchy spots 2 times daily. For elbows at night time, apply medicine and then cover with saran wrap. (Patient not taking: Reported on 06/12/2020) 454 g 0     No current facility-administered medications for this visit.        Changes to medications: [name] reports no changes at this time.    Allergies   Allergen Reactions   ??? Sulfa (Sulfonamide Antibiotics) Hives   ??? Sulfur Hives       Changes to allergies: No    SPECIALTY MEDICATION ADHERENCE     Dupixent - 0 left  Medication Adherence    Patient reported X missed doses in the last month: 0  Specialty Medication: Dupixent          Specialty medication(s) dose(s) confirmed: Regimen is correct and unchanged.     Are there any concerns with adherence? No    Adherence counseling provided? Not needed    CLINICAL MANAGEMENT AND INTERVENTION      Clinical Benefit Assessment:    Do you feel the medicine is effective or helping your condition? unclear, he reports some slight possible improvement    Clinical Benefit counseling provided? Reasonable expectations discussed: may take ~8-12 weeks for full effect. I  also encouraged him to make f/u appt.    Adverse Effects Assessment:    Are you experiencing any side effects? No    Are you experiencing difficulty administering your medicine? No    Quality of Life Assessment:    How many days over the past month did your dermatitis  keep you from your normal activities? For example, brushing your teeth or getting up in the morning. 0    Have you discussed this with your provider? Not needed    Therapy Appropriateness:    Is therapy appropriate? Yes, therapy is appropriate and should be continued    DISEASE/MEDICATION-SPECIFIC INFORMATION      For patients on injectable medications: Patient currently has 0 doses left.  Next injection is scheduled for Monday, Dec 27.    PATIENT SPECIFIC NEEDS     - Does the patient have any physical, cognitive, or cultural barriers? No    - Is the patient high risk? No    - Does the patient require a Care Management Plan? No     - Does the patient require physician intervention or other additional services (i.e. nutrition, smoking cessation, social work)? No      SHIPPING     Specialty Medication(s) to be Shipped:   Inflammatory Disorders: Dupixent    Other medication(s) to be shipped: No additional medications requested for fill at this time     Changes to insurance: No    Delivery Scheduled: Yes, Expected medication delivery date: Wed, Dec 22.     Medication will be delivered via Same Day Courier to the confirmed prescription address in [facility].    The patient will receive a drug information handout for each medication shipped and additional FDA Medication Guides as required.  Verified that patient has previously received a Conservation officer, historic buildings.    All of the patient's questions and concerns have been addressed.    Lanney Gins   [facility] Specialty Pharmacist

## 2020-06-21 MED FILL — DUPIXENT 300 MG/2 ML SUBCUTANEOUS PEN INJECTOR: SUBCUTANEOUS | 28 days supply | Qty: 4 | Fill #1

## 2020-06-21 MED FILL — DUPIXENT 300 MG/2 ML SUBCUTANEOUS PEN INJECTOR: 28 days supply | Qty: 4 | Fill #1 | Status: AC

## 2020-06-21 NOTE — Unmapped (Signed)
Gabapentin refill

## 2020-06-24 MED ORDER — AMLODIPINE 5 MG TABLET
ORAL_TABLET | 11 refills | 0 days
Start: 2020-06-24 — End: ?

## 2020-06-26 MED ORDER — GABAPENTIN 300 MG CAPSULE
ORAL_CAPSULE | 2 refills | 0 days | Status: CP
Start: 2020-06-26 — End: ?

## 2020-06-26 MED ORDER — AMLODIPINE 5 MG TABLET
ORAL_TABLET | 11 refills | 0 days | Status: CP
Start: 2020-06-26 — End: ?

## 2020-06-26 NOTE — Unmapped (Signed)
Refill request: Amlodipine. Last visit: 06/12/20. Last HIV labs: 03/20/20.

## 2020-07-12 NOTE — Unmapped (Signed)
Substance Use Services     Duration of Intervention: 10 min     REASON/TYPE OF CONTACT: Phone, Patient called to confirm derm appt.    ASSESSMENT: Patient confirmed derm appt time. Patient reports that he has had a good new year and good holidays. Patient continues to maintain sobriety. Patient discussed different triggers while being at home and ways he coped. Patient continues to maintain motivation with sobriety. Patient continues to stay busy with work. Patient interested in getting connected with legal services to go forward with divorce from long time seperated partner. Patient advised to contact legal aid.     INTERVENTION:  SW provided information regarding medical care and connected patient with resources.     PLAN:  No further needs.       Patient is being provided [employer] of last resort services for Relapse Prevention. Patient is either uninsured or a benefits investigation has shown the patients insurance does not provide this service to the degree needed by the patient at this time.     Chen Saadeh LCSW, LCAS  [facility] Social Work   Direct: [phone]

## 2020-07-12 NOTE — Unmapped (Deleted)
Dermatology Note     Assessment and Plan:      Chronic dermatitis: failed MTX  Patient has seen many different [facility] Dermatologists. Has diffuse dermatitis but also e/o chronic change on the posterior neck/ears which is reminiscent of chronic actinic dermatitis.   -  Biopsy 03/2019 w/ spongiosis, delicate parakeratosis, superficial dermal perivascular lymphocytic infiltrae  - Biopsy 03/2020 w/ spongiosis, associated with lymphocyte and histiocytes exocytosis including the formation of a small collection of histiocytes within the epidermis.  There is prominent fibrosis of the papillary dermis and a lymphohistiocytic infiltrate in a perivascular and interstitial pattern.  - Continue betamethasone ointment twice daily to thicker itchy areas until smooth  - Continue triamcinolone ointment to thinner active areas until smooth  - Moisturize with ONLY vaseline otherwise  - Patient is on a few medications which could be contributing although timing would suggest otherwise - could consider trials off of medications if no improvement with above.    There are no diagnoses linked to this encounter.    The patient was advised to call for an appointment should any new, changing, or symptomatic lesions develop.     RTC: No follow-ups on file. or sooner as needed   _________________________________________________________________      Chief Complaint     No chief complaint on file.      HPI     Timothy Tapia is a 55 y.o. male who presents as a returning patient (last seen by Drue Dun, RN on 04/27/2020) to [facility] for follow up of chronic dermatitis. At last visit, patient had his sutures removed from his previous punch biopsy on 04/13/2020. Today, patient reports the following:    Chronic dermatitis  -     The patient denies any other new or changing lesions or areas of concern.     Pertinent Past Medical History     No history of skin cancer   HIV (last CD4 864, viral load undetectable) - on prezcobix + dolutegravir  History of ETOH abuse and cocaine abuse  Tinea pedis which resolved with lamisil    Family History:   Negative for melanoma    Past Medical History, Family History, Social History, Medication List, Allergies, and Problem List were reviewed in the rooming section of Epic.     ROS: Other than symptoms mentioned in the HPI, no fevers, chills, or other skin complaints    Physical Examination     GENERAL: Well-appearing male in no acute distress, resting comfortably.  NEURO: Alert and oriented, answers questions appropriately  PSYCH: Normal mood and affect  {PE extent:75514}  {PE list:75421}  ***    All areas not commented on are within normal limits or unremarkable    Scribe's Attestation: Sharene Butters, PA obtained and performed the history, physical exam and medical decision making elements that were entered into the chart.  Signed by Aaron Mose, Scribe, on ***.    {*** NOTE TO PROVIDER: PLEASE ADD ATTESTATION NOTING YOU AGREE WITH SCRIBE DOCUMENTATION}     (Approved Template 03/13/2020)

## 2020-07-13 NOTE — Unmapped (Signed)
[facility] Refill Coordination Note    Specialty Medication(s) to be Shipped:   Inflammatory Disorders: Dupixent    Other medication(s) to be shipped: No additional medications requested for fill at this time     Timothy Tapia, DOB: [birth year]  Phone: [phone] (home) [phone] (work)      All above HIPAA information was verified with patient.     Was a Nurse, learning disability used for this call? No    Completed refill call assessment today to schedule patient's medication shipment from the [facility] [phone]).       Specialty medication(s) and dose(s) confirmed: Regimen is correct and unchanged.   Changes to medications: [name] reports no changes at this time.  Changes to insurance: No  Questions for the pharmacist: No    Confirmed patient received Welcome Packet with first shipment. The patient will receive a drug information handout for each medication shipped and additional FDA Medication Guides as required.       DISEASE/MEDICATION-SPECIFIC INFORMATION        For patients on injectable medications: Patient currently has 0 doses left.  Next injection is scheduled for 07/24/2020.    SPECIALTY MEDICATION ADHERENCE     Medication Adherence    Patient reported X missed doses in the last month: 0  Specialty Medication: Dupixent 300 mg/2 ml  Patient is on additional specialty medications: No  Any gaps in refill history greater than 2 weeks in the last 3 months: no  Demonstrates understanding of importance of adherence: yes  Informant: patient  Reliability of informant: reliable  Confirmed plan for next specialty medication refill: delivery by pharmacy  Refills needed for supportive medications: not needed                      SHIPPING     Shipping address confirmed in Epic.     Delivery Scheduled: Yes, Expected medication delivery date: 07/19/2020.     Medication will be delivered via Same Day Courier to the prescription address in Epic WAM.    Koen Antilla D Reet Scharrer   [facility] Specialty Technician

## 2020-07-19 MED FILL — DUPIXENT 300 MG/2 ML SUBCUTANEOUS PEN INJECTOR: SUBCUTANEOUS | 28 days supply | Qty: 4 | Fill #2

## 2020-07-21 MED FILL — MIRTAZAPINE 30 MG TABLET: ORAL | 30 days supply | Qty: 30 | Fill #4

## 2020-07-24 NOTE — Unmapped (Signed)
Substance Use Services     Duration of Intervention: 25  min     REASON/TYPE OF CONTACT: Phone and Face to Face, Patient reports that pain in leg has returned and needs to come into clinic for help with application.     ASSESSMENT: Patient reports that he has been experiencing pain in his leg again and would like a referral to the spine clinic. Patient requested that social worker message provider, Dr. Servando Snare, to put in a referral.  Patient also discussed needing assistance with an application and coming in to the clinic at around 2pm to see social worker.     Patient came into the clinic and discussed ongoing sobriety efforts. Patient is now 6 months sober. Patient reports that he is more invested in the steps than he has been in the past and continues to stay committed. Patient interested in looking for a job through [employer]. SW provided patient with [employer] resource to help him navigate appliations and resumes.  Patient also discussed need for an ID to apply for new jobs. SW and patient scheduled a DMV appointment in [place] on March 7th at 1pm. Patient printed reminder out for him to keep.     INTERVENTION:  SW assessed needs and offered resource navigation.     PLAN:  SW will message provider about follow up with leg pain and referral to spine clinic.       Patient is being provided [employer] of last resort services for Relapse Prevention. Patient is either uninsured or a benefits investigation has shown the patients insurance does not provide this service to the degree needed by the patient at this time.         Balinda Heacock LCSW, LCAS  [facility] Social Work   Direct: [phone]

## 2020-07-24 NOTE — Unmapped (Signed)
Duration of Intervention: 10 min     REASON FOR DISCHARGE:  Completed care plan goals- Patient has been 6 months clean, patient connected to community resources to maintain sobriety    EFFECTIVE DATE:  07/24/2020    OUTSIDE REFERRALS MADE: Yes, describe: patient will continue to participate in medical case management with this social worker to meet resource navigation and positive health outcome needs.        MEDICAL CASE MANAGEMENT ASSESSMENT AND CARE PLAN    REASON/TYPE OF CONTACT: Face to Face    Psychosocial Needs   Care Coordination  Caregiving responsibilities  Child care  Child welfare  Communication Concerns  Complex med. regime  Dental care  Difficulty w/ follow-through Disability determination  Discrimination  Doubts med. 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 Patient continues to need care coordination needs with his providers at [facility].  Patient has also discussed continuing to connect with community through better employment, housing and maintain good relationships with natural and social supports.              Medical Case Management Care Plan  Resource Navigation     NEED: Patient has identified potential/current social barriers to treatment adherence and will need assistance with navigating social/health care systems       GOAL: To decrease/eliminate social barriers for HIV treatment and overall healthcare     PERSON: Nurse, Market researcher, Medical Provider, Social Worker, Garment/textile technologist and governmental agencies     INTERVENTIONS:    1. Social Worker will assist patient with navigating healthcare/social systems   2. Social Worker will locate resources that can help with pt's identified need    3. Social Worker will provide patient with these community resources   4. Patient will be responsible for contacting agencies to request assistance   5. Patient will contact social worker if further coordination is needed and SW will assist   6. Social Worker will reassess patient's need for continued coordination services within 6 months     OUTCOME:    1. Staff documentation in EPIC of barriers, progression, and efforts    2. Reassess when necessary or at the minimum every six months for continued need    This patient is being provided a service through [employer] of resort. A benefits investigation was done by social work staff and the patient was found to have no insurance or that their health insurance does not cover the services necessary for patient success. In some cases, if health insurance does cover the service, social work staff serve as interim providers until a long term service provider can be found in cases where current service providers are not accessible due to affordability, availability, or specialist knowledge.      Ivis Nicolson LCSW, LCAS  [facility] Social Work   Direct: [phone]

## 2020-07-26 MED ORDER — ENALAPRIL MALEATE 20 MG TABLET
ORAL_TABLET | 11 refills | 0 days
Start: 2020-07-26 — End: ?

## 2020-07-31 MED ORDER — ENALAPRIL MALEATE 20 MG TABLET
ORAL_TABLET | 11 refills | 0 days | Status: CP
Start: 2020-07-31 — End: ?

## 2020-08-09 MED ORDER — CETIRIZINE 10 MG TABLET
ORAL_TABLET | 2 refills | 0 days | Status: CP
Start: 2020-08-09 — End: ?

## 2020-08-10 NOTE — Unmapped (Signed)
[facility] Refill Coordination Note    Specialty Medication(s) to be Shipped:   Inflammatory Disorders: Dupixent    Other medication(s) to be shipped: No additional medications requested for fill at this time     Timothy Tapia, DOB: [birth year]  Phone: [phone] (home) [phone] (work)      All above HIPAA information was verified with patient.     Was a Nurse, learning disability used for this call? No    Completed refill call assessment today to schedule patient's medication shipment from the [facility] [phone]).       Specialty medication(s) and dose(s) confirmed: Regimen is correct and unchanged.   Changes to medications: Timothy Tapia reports no changes at this time.  Changes to insurance: No  Questions for the pharmacist: No    Confirmed patient received Welcome Packet with first shipment. The patient will receive a drug information handout for each medication shipped and additional FDA Medication Guides as required.       DISEASE/MEDICATION-SPECIFIC INFORMATION        For patients on injectable medications: Patient currently has 0 doses left.  Next injection is scheduled for 08/21/2020.    SPECIALTY MEDICATION ADHERENCE     Medication Adherence    Patient reported X missed doses in the last month: 0  Specialty Medication: Dupixent 300 mg/2 ml  Patient is on additional specialty medications: No  Any gaps in refill history greater than 2 weeks in the last 3 months: no  Demonstrates understanding of importance of adherence: yes  Informant: patient  Reliability of informant: reliable  Confirmed plan for next specialty medication refill: delivery by pharmacy  Refills needed for supportive medications: not needed                      SHIPPING     Shipping address confirmed in Epic.     Delivery Scheduled: Yes, Expected medication delivery date: 08/14/2020.     Medication will be delivered via Same Day Courier to the prescription address in Epic WAM.    Timothy Tapia   [facility] Specialty Technician

## 2020-08-10 NOTE — Unmapped (Signed)
Medical Case Management Social Work Note     Duration of Intervention: 10 min     REASON/TYPE OF CONTACT: Phone, Patient reports needing an evaluation for pain that is keeping him out of work.     ASSESSMENT: Patient agreed that he would like to be seen. Patient agreed that a nurse can reach out and assess for an urgent care appointment. SW reached out to colleague, Rayburn Ma, to evaluate.     ADHERENCE: Did not discuss at this interaction due to competing priorities and/or no concerns.    INTERVENTION:  SW assisted with care coordination.     PLAN:  No further needs.     Timothy Gafford LCSW, LCAS  [facility] Social Work   Direct: [phone]

## 2020-08-11 NOTE — Unmapped (Signed)
Returned pt's VM requesting an appt. No answer, LM asking him to call back if still interested in scheduling.

## 2020-08-14 MED FILL — DUPIXENT 300 MG/2 ML SUBCUTANEOUS PEN INJECTOR: SUBCUTANEOUS | 28 days supply | Qty: 4 | Fill #3

## 2020-08-17 MED FILL — MIRTAZAPINE 30 MG TABLET: ORAL | 30 days supply | Qty: 30 | Fill #5

## 2020-08-22 ENCOUNTER — Ambulatory Visit: Admit: 2020-08-22 | Discharge: 2020-08-23 | Payer: MEDICARE | Attending: Dermatology | Primary: Dermatology

## 2020-08-22 DIAGNOSIS — L309 Dermatitis, unspecified: Principal | ICD-10-CM

## 2020-08-22 MED ORDER — BETAMETHASONE, AUGMENTED 0.05 % TOPICAL OINTMENT
6 refills | 0.00000 days | Status: CP
Start: 2020-08-22 — End: ?
  Filled 2020-08-24: qty 50, 20d supply, fill #0

## 2020-08-22 NOTE — Unmapped (Signed)
Dermatology Note     Assessment and Plan:      Eczematous dermatitis - improved   Failed treatments: Methotrexate   -  Biopsy 03/2019 w/ spongiosis, delicate parakeratosis, superficial dermal perivascular lymphocytic infiltrate. Biopsy 03/2020 with Epidermal acanthosis with focal spongiosis and prominent papillary dermal fibrosis.  - started Dupixent 05/2020 and has responded well   - Continue Dupixent 300 mg every 14 days   - Continue using betamethasone, augmented, (DIPROLENE) 0.05 % ointment; Apply twice daily to hands and then cover with gloves  - stop using Triamcinolone 0.1% Ointment as reports worsening pruritus   - Reviewed side effects of topical corticosteroids including atrophy, striae and dyschromia; however, discussed the current light areas are PIH and secondary to eczema and not the medications  - Continue using Vaseline daily     The patient was advised to call for an appointment should any new, changing, or symptomatic lesions develop.     RTC: Return in about 6 months (around 02/19/2021). or sooner as needed   _________________________________________________________________      Chief Complaint     Chief Complaint   Patient presents with    Rash     follow up       HPI     Gregroy L. Tapia is a 55 y.o. male who presents as a returning patient (last seen No previous visit found) to [facility] for follow up of dermatitis .     His rash is significantly improved, he still has some residual itching on his elbows. The betamethasone works really well, but Triamcinolone makes him feel more itchy. He has been out of betamethasone.     Final Diagnosis   Date Value Ref Range Status   04/13/2020   Final    Right lateral thigh, punch  -Epidermal acanthosis with focal spongiosis and prominent papillary dermal fibrosis. (See comment.)            The patient denies any other new or changing lesions or areas of concern.     Pertinent Past Medical History     No history of skin cancer    Family History: Negative for melanoma    Past Medical History, Family History, Social History, Medication List, Allergies, and Problem List were reviewed in the rooming section of Epic.     ROS: Other than symptoms mentioned in the HPI, no fevers, chills, or other skin complaints    Physical Examination     GENERAL: Well-appearing male in no acute distress, resting comfortably.  NEURO: Alert and oriented, answers questions appropriately  RESP: No increased work of breathing  SKIN (Sun exposed Exam): Per patient request, examination of the face, neck, scalp, bilateral upper extremities, palms, and fingernails was performed, including back   - lichenified scaling plaque on the bilateral lateral thigh, elbows, knees   - hyperpigmented patches on the legs, back and arms consistent with post inflammatory hyperpigmentation     All areas not commented on are within normal limits or unremarkable      (Approved Template 03/13/2020)

## 2020-08-22 NOTE — Unmapped (Signed)
Stop using Triamcinolone 0.1% Ointment    We refilled betamethasone 0.05% ointment  for you. Use this 1-2 times a day as needed on your body until the itchy spots are smooth.   Continue using Vaseline daily

## 2020-09-04 NOTE — Unmapped (Signed)
[facility] Pharm[facility] Refill Coordination Note    Specialty Medication(s) to be Shipped:   Inflammatory Disorders: Dupixent    Other medication(s) to be shipped: No additional medications requested for fill at this time     Timothy Tapia, DOB: Mar 02, 1966  Phone: [phone] (work)      All above HIPAA information was verified with patient.     Was a Nurse, learning disability used for this call? No    Completed refill call assessment today to schedule patient's medication shipment from the [facility] [phone]).       Specialty medication(s) and dose(s) confirmed: Regimen is correct and unchanged.   Changes to medications: Timothy Tapia reports no changes at this time.  Changes to insurance: No  Questions for the pharmacist: No    Confirmed patient received Welcome Packet with first shipment. The patient will receive a drug information handout for each medication shipped and additional FDA Medication Guides as required.       DISEASE/MEDICATION-SPECIFIC INFORMATION        For patients on injectable medications: Patient currently has 0 doses left.  Next injection is scheduled for 09/11/2020.    SPECIALTY MEDICATION ADHERENCE     Medication Adherence    Patient reported X missed doses in the last month: 0  Specialty Medication: Dupixent 300 mg/2 ml  Patient is on additional specialty medications: No  Any gaps in refill history greater than 2 weeks in the last 3 months: no  Demonstrates understanding of importance of adherence: yes  Informant: patient  Reliability of informant: reliable  Confirmed plan for next specialty medication refill: delivery by pharmacy  Refills needed for supportive medications: not needed                      SHIPPING     Shipping address confirmed in Epic.     Delivery Scheduled: Yes, Expected medication delivery date: 09/05/2020.     Medication will be delivered via Same Day Courier to the prescription address in Epic WAM.    Timothy Tapia   [facility] Specialty Technician

## 2020-09-04 NOTE — Unmapped (Signed)
RD called pt to screen for food insecurity based on FPL and RW eligibility. RD left VM for pt to call back clinic for further questions.    Unk Pinto, MS, RD, LDN

## 2020-09-05 MED ORDER — VENTOLIN HFA 90 MCG/ACTUATION AEROSOL INHALER
0.00000 days
Start: 2020-09-05 — End: ?

## 2020-09-05 MED FILL — DUPIXENT 300 MG/2 ML SUBCUTANEOUS PEN INJECTOR: SUBCUTANEOUS | 28 days supply | Qty: 4 | Fill #4

## 2020-09-06 MED ORDER — ESOMEPRAZOLE MAGNESIUM 40 MG CAPSULE,DELAYED RELEASE
ORAL_CAPSULE | 11 refills | 0 days
Start: 2020-09-06 — End: ?

## 2020-09-08 MED ORDER — ESOMEPRAZOLE MAGNESIUM 40 MG CAPSULE,DELAYED RELEASE
ORAL_CAPSULE | 11 refills | 0.00000 days | Status: CP
Start: 2020-09-08 — End: ?

## 2020-09-08 NOTE — Unmapped (Signed)
Per Dr. Servando Snare note from 05/2020, Barrett's Espohagitis  ?? On PPI  Nexium 40 mg capsule #30 with 11 refills. Patient next office visit scheduled for June 2022

## 2020-09-11 NOTE — Unmapped (Signed)
Medical Case Management Social Work Note     Duration of Intervention: 10 min     REASON/TYPE OF CONTACT: Phone, Patient called this Child psychotherapist.     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 Patient reports that he continues to maintain his sobriety and is happy about his new responsibilities. Patient reported some swollen feet and wanting them to get looked at. SW encouraged patient to call main clinic line and ask for the nurse on duty so they can set up with an urgent care appointment. Patient agreed. Patient reported that he also needed to come in to see the social worker in the morning and social worker provided times that she may be available.       ADHERENCE: Did not discuss at this interaction due to competing priorities and/or no concerns.    INTERVENTION: SW provided affirmation and clinic navigation.     PLAN:  Patient will set up an urgent care appointment.     Ranelle Auker LCSW, LCAS  [facility] Social Work   Direct: [phone]

## 2020-09-12 ENCOUNTER — Ambulatory Visit
Admit: 2020-09-12 | Discharge: 2020-09-13 | Payer: MEDICARE | Attending: Student in an Organized Health Care Education/Training Program | Primary: Student in an Organized Health Care Education/Training Program

## 2020-09-12 ENCOUNTER — Ambulatory Visit: Admit: 2020-09-12 | Discharge: 2020-09-13 | Payer: MEDICARE

## 2020-09-12 DIAGNOSIS — M79671 Pain in right foot: Principal | ICD-10-CM

## 2020-09-12 DIAGNOSIS — M79672 Pain in left foot: Principal | ICD-10-CM

## 2020-09-12 DIAGNOSIS — R21 Rash and other nonspecific skin eruption: Principal | ICD-10-CM

## 2020-09-12 MED ORDER — HALOBETASOL PROPIONATE 0.05 % TOPICAL OINTMENT
OPHTHALMIC | 1 refills | 0.00000 days | Status: CP
Start: 2020-09-12 — End: ?
  Filled 2020-09-13: qty 30, 15d supply, fill #0

## 2020-09-12 MED ORDER — NAPROXEN 500 MG TABLET
ORAL_TABLET | Freq: Two times a day (BID) | ORAL | 0 refills | 10.00000 days | Status: CP
Start: 2020-09-12 — End: 2020-09-22
  Filled 2020-09-13: qty 20, 10d supply, fill #0

## 2020-09-12 MED ORDER — CELECOXIB 200 MG CAPSULE
ORAL_CAPSULE | Freq: Every day | ORAL | 0 refills | 30.00000 days | Status: CP
Start: 2020-09-12 — End: 2020-09-12

## 2020-09-12 MED ORDER — GABAPENTIN 300 MG CAPSULE
ORAL_CAPSULE | ORAL | 2 refills | 0.00000 days | Status: CP
Start: 2020-09-12 — End: ?
  Filled 2020-09-12: qty 270, 90d supply, fill #0

## 2020-09-12 NOTE — Unmapped (Signed)
Same Day Clinic Referral Intake    Location of Patient: [facility]    Source of Referral: Provider: Infectious Diseases    Referring Provider Name, Contact Number (if applicable):   Will referring provider be involved in co-management?  no    Reason for Referral: bilateral foot edema and pain    Procedure Anticipated?  If yes, select procedure: No    Diagnostic Testing Anticipated?  If yes, select test(s): None    Vitals:  @VITALS @    Additional Info: None    Will patient be seen on the day of referral in Same Day Clinic? Yes

## 2020-09-12 NOTE — Unmapped (Signed)
INTERNAL MEDICINE ADVANCED SAME DAY CLINIC NOTE     09/12/2020    PCP: Lynnea Ferrier, MD     Assessment and Plan    Timothy Tapia was seen today for edema.    Diagnoses and all orders for this visit:    Bilateral foot pain  Endorsing 2 weeks of worsening bilateral foot pain with weight bearing. Differential includes stress fracture vs. plantar fasciitis. Inconsistent with gout given bilateral presentation without joint involvement. Unlikely cellulitis vs. Osteomyelitis despite risk factors (HIV, Dupixent) given lack of skin changes or signs of infection despite improvement in patient previously with antibiotics.   - Advised pt to discontinue BC power use PRN for pain  - Start Naproxen 500mg  BID PRN for pain though advised pt to limit use when possible  - Counseled on conservative management including decreasing time on feet, massage, ice/heat application, and consideration of new/more supportive work shoe  -     XR Foot 3 Or More Views Bilateral to r/o fracture    Rash and nonspecific skin eruption  Pt follows with Dermatology for Eczema. Requesting refill of steroid cream.   -     halobetasol (ULTRAVATE) 0.05 % ointment; APPLY TO AFFECTED AREA TWICE A DAY AS NEEDED AVOID FACE AND FOLDS    Back pain:  Managed with gabapentin currently. Pt has been out for several days due to fellow resident of [facility] taking his medication. This resident has since been removed, but pt is requesting refill early. Will oblige, though will defer to PCP for future refill requests.   -     gabapentin (NEURONTIN) 300 MG capsule; TAKE ONE CAPSULE BY MOUTH 3 TIMES A DAY    Follow up as scheduled or sooner as needed.    Patient was seen and discussed with Dr. Clydene Pugh who is in agreement with the assessment and plan as outlined above.       Subjective    Problem List:  Patient Active Problem List   Diagnosis   ??? Chorioretinal scar   ??? Human immunodeficiency virus (HIV) disease (CMS-HCC)   ??? Pancreatitis   ??? Tobacco use disorder   ??? HTN (hypertension)   ??? Barrett esophagus   ??? Asthma   ??? Colitis   ??? DVT (deep venous thrombosis), IMV thrombus 10/07/13 provoked per hematology (79mo therapy)   ??? Pancreatic pseudocyst   ??? GI bleeding   ??? ETOH abuse   ??? COPD (chronic obstructive pulmonary disease) (CMS-HCC)   ??? Hypokalemia   ??? GERD (gastroesophageal reflux disease)   ??? Chronic bilateral low back pain       HPI  Timothy Tapia is a 55 y.o. year old male with the above problem list who presents to the same day clinic for   Chief Complaint   Patient presents with   ??? Edema     bilateral foot edema and pain     Bilateral foot pain that started several weeks ago. No known inciting event. Happened one time prior and pain resolved with a medication, though he does not remember what this was treating. Per chart review, appears he was treated for L foot pain in 09/2019 with Keflex for presumed cellulitis. XR at that time was not concerning for osteomyelitis. Pain located in midfoot in bilateral feet. No pain when non-weight bearing. No known prior trauma.     Was taking gabapentin for back pain. Has been out for the last several days because someone was stealing his medication at the [facility]  house. Reported gabapentin did not seem to be helping his foot pain.     Denies fevers/chills. No nausea/vomiting. Has been taking BC powders 1-2 a day for the past week. Does not help the pain.     Wears boots at work with supportive insoles. On his feet for long hours at work [employer]) and can not be out of work for extended periods of time due to financial concerns.     Meds and allergies were reviewed in Epic    ROS: 10 point ROS was performed and is otherwise negative other than mentioned in the HPI    Objective  PE:  Vitals:    09/12/20 1318   BP: 158/98   Pulse: 78   Temp: 36.6 ??C   SpO2: 99%     General: chronically ill-appearing in NAD  Eyes: EOMI, sclera clear, PERRL  ENT: Poor dentition   CV: regular, no murmurs  Resp: CTAB, faint wheezes bilaterally, normal WOB  GI: soft, NTND, NABS  MSK: full ROM and strength  5/5 in bilateral LE. Tenderness to palpation over dorsal/medial surface of bilateral feet. Tender over soft tissue surrounding bilateral ankle joints anteriorly. No tenderness to palpation over plantar fascia. No tenderness to palpation over toe joints or pain with movement of bilateral feet.   Skin: clean and dry. Chronic skin thickening over bilateral feet/LE noted. No erythema or increased warmth noted.   Ext: no cyanosis/clubbing/edema  Neuro: alert, follows commands. CN II-XII grossly intact    Procedure: None  See procedure note from this encounter    _______________________________________________________________________________________    Same Day Metric Tracker:    Referral source for today's visit:  Other    Referral to other outpatient urgent services? N/A    Did today's visit result in referral to ED or direct admission? No    Without today's visit, would this patient have required an ED visit or hospitalization? No

## 2020-09-12 NOTE — Unmapped (Signed)
I saw and evaluated the patient, participating in the key portions of the service.  I reviewed the resident’s note.  I agree with the resident’s findings and plan. Camryn Quesinberry R Steele Stracener, MD

## 2020-09-15 ENCOUNTER — Ambulatory Visit: Admit: 2020-09-15 | Payer: MEDICARE

## 2020-09-15 NOTE — Unmapped (Signed)
Duration of Intervention: 25 minutes      Patient came into clinic and met with this social worker re: housing application.  SW provided education around Section 8 housing application for [facility].  SW informed patient of need to collect paystubs so that he can submit the application. SW also informed patient that he can deliver the application to the address on the form or that he can fax to the number on the form. Patient informed of this social worker departure and discussed other ways to get in contact with a Child psychotherapist. Patient aware to call the main clinic and ask for a Child psychotherapist.  At this time patient has no other needs. 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 Patient is also working with [employer] where he was able to obtain the Section 8 application.      REASON FOR DISCHARGE:  Completed care plan goals    EFFECTIVE DATE:  09/15/20    OUTSIDE REFERRALS MADE: Yes, [employer] for case management needs.  In clinic resources to call a social worker if needs arise.       Chaneka Trefz LCSW, LCAS  [facility] Social Work   Direct: [phone]

## 2020-09-18 MED FILL — MIRTAZAPINE 30 MG TABLET: ORAL | 30 days supply | Qty: 30 | Fill #6

## 2020-09-26 MED FILL — BETAMETHASONE, AUGMENTED 0.05 % TOPICAL OINTMENT: 20 days supply | Qty: 50 | Fill #1

## 2020-09-27 NOTE — Unmapped (Signed)
[facility] Refill Coordination Note    Specialty Medication(s) to be Shipped:   Inflammatory Disorders: Dupixent    Other medication(s) to be shipped: No additional medications requested for fill at this time     Timothy Tapia, DOB: April 24, 1966  Phone: [phone] (work)      All above HIPAA information was verified with patient.     Was a Nurse, learning disability used for this call? No    Completed refill call assessment today to schedule patient's medication shipment from the [facility] [phone]).       Specialty medication(s) and dose(s) confirmed: Regimen is correct and unchanged.   Changes to medications: Elric reports no changes at this time.  Changes to insurance: No  Questions for the pharmacist: No    Confirmed patient received Welcome Packet with first shipment. The patient will receive a drug information handout for each medication shipped and additional FDA Medication Guides as required.       DISEASE/MEDICATION-SPECIFIC INFORMATION        For patients on injectable medications: Patient currently has 0 doses left.  Next injection is scheduled for 10/09/2020.    SPECIALTY MEDICATION ADHERENCE     Medication Adherence    Patient reported X missed doses in the last month: 0  Specialty Medication: Dupixent 300 mg/2 ml  Patient is on additional specialty medications: No  Any gaps in refill history greater than 2 weeks in the last 3 months: no  Demonstrates understanding of importance of adherence: yes  Informant: patient  Reliability of informant: reliable  Confirmed plan for next specialty medication refill: delivery by pharmacy  Refills needed for supportive medications: not needed                      SHIPPING     Shipping address confirmed in Epic.     Delivery Scheduled: Yes, Expected medication delivery date: 10/02/2020.     Medication will be delivered via Same Day Courier to the prescription address in Epic WAM.    Jamear Carbonneau D Brynleigh Sequeira   [facility] Specialty Technician

## 2020-09-29 NOTE — Unmapped (Signed)
Referral Services Note     Duration of Intervention: 60 minutes    REASON/TYPE OF CONTACT: Face to Face    ASSESSMENT: Pt requested a visit with this SW to assist with electronic navigation of onboarding information for new job.     INTERVENTION:  This SW assisted Pt make a log in, enter his background information, and set up his work/APP account.     PLAN:  Pt established the app and will be in touch as needed.     Adalberto Ill, Darcey Nora, LCSW  [facility] Youth Social Worker  Direct: [phone]  Main ID: [phone]

## 2020-10-02 MED FILL — DUPIXENT 300 MG/2 ML SUBCUTANEOUS PEN INJECTOR: SUBCUTANEOUS | 28 days supply | Qty: 4 | Fill #5

## 2020-10-02 NOTE — Unmapped (Signed)
Referral Services Note     Duration of Intervention: 42 minutes    REASON/TYPE OF CONTACT: Face to Face    ASSESSMENT: Pt requested further assistance with his job application related to his background check.    INTERVENTION:  Assisted Pt navigate the technology required to move forward with his job opportunity.     PLAN:  Pt will follow up if further assistance is needed.    Adalberto Ill, Darcey Nora, LCSW  [facility] Youth Social Worker  Direct: [phone]  Main ID: [phone]
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
## 2020-10-19 MED FILL — BETAMETHASONE, AUGMENTED 0.05 % TOPICAL OINTMENT: 20 days supply | Qty: 50 | Fill #2

## 2020-10-19 MED FILL — MIRTAZAPINE 30 MG TABLET: ORAL | 30 days supply | Qty: 30 | Fill #7

## 2020-10-23 DIAGNOSIS — L309 Dermatitis, unspecified: Principal | ICD-10-CM

## 2020-10-23 MED ORDER — DUPIXENT 300 MG/2 ML SUBCUTANEOUS PEN INJECTOR
SUBCUTANEOUS | 5 refills | 28 days
Start: 2020-10-23 — End: ?

## 2020-10-25 MED ORDER — DUPIXENT 300 MG/2 ML SUBCUTANEOUS PEN INJECTOR
SUBCUTANEOUS | 5 refills | 28.00000 days | Status: CP
Start: 2020-10-25 — End: ?
  Filled 2020-10-25: qty 4, 28d supply, fill #0

## 2020-10-30 NOTE — Unmapped (Addendum)
Timothy Tapia reports he's had some improvement, but still has some itching. He's got a follow up with dermatology clinic soon.       [facility] Clinical Assessment & Refill Coordination Note    Lenvil Swaim, DOB: 10-14-1965  Phone: [phone] (work)    All above HIPAA information was verified with patient.     Was a Nurse, learning disability used for this call? No    Specialty Medication(s):   Inflammatory Disorders: Dupixent     Current Outpatient Medications   Medication Sig Dispense Refill   ??? amLODIPine (NORVASC) 5 MG tablet TAKE ONE TABLET BY MOUTH DAILY 30 tablet 11   ??? betamethasone, augmented, (DIPROLENE) 0.05 % ointment Apply twice daily to hands and then cover with gloves 50 g 6   ??? cetirizine (ZYRTEC) 10 MG tablet TAKE ONE TABLET BY MOUTH EVERY DAY 30 tablet 2   ??? CREON 24,000-76,000 -120,000 unit CpDR delayed release capsule TAKE 1 CAPSULE BY MOUTH THREE TIMES DAILY WITH A MEAL 90 capsule 6   ??? dupilumab (DUPIXENT PEN) 300 mg/2 mL PnIj Inject the contents of 1 pen (300 mg) under the skin every fourteen (14) days. 4 mL 5   ??? empty container Misc Use as directed to dispose of Dupixent pens. 1 each 2   ??? enalapril (VASOTEC) 20 MG tablet TAKE 1 TABLET BY MOUTH EVERY DAY 30 tablet 11   ??? esomeprazole (NEXIUM) 40 MG capsule TAKE 1 CAPSULE BY MOUTH EVERY MORNING BEFORE BREAKFAST. 30 capsule 11   ??? gabapentin (NEURONTIN) 300 MG capsule TAKE ONE CAPSULE BY MOUTH 3 TIMES A DAY 270 capsule 2   ??? halobetasol (ULTRAVATE) 0.05 % ointment APPLY TO AFFECTED AREA TWICE A DAY AS NEEDED AVOID FACE AND FOLDS 30 g 1   ??? halobetasol (ULTRAVATE) 0.05 % ointment Apply topically Two (2) times a day. To active areas until smooth 100 g 4   ??? mirtazapine (REMERON) 30 MG tablet Take 1 tablet (30 mg total) by mouth daily. 30 tablet 11   ??? PREZCOBIX 800-150 mg-mg tablet TAKE ONE TABLET BY MOUTH DAILY 30 tablet 11   ??? TIVICAY 50 mg TABLET TAKE ONE TABLET BY MOUTH DAILY 30 tablet 11   ??? triamcinolone (KENALOG) 0.1 % ointment Apply to itchy spots 2 times daily. For elbows at night time, apply medicine and then cover with saran wrap. 454 g 0   ??? VENTOLIN HFA 90 mcg/actuation inhaler INHALE 2 PUFFS UP TO FOUR TIMES DAILY AS NEEDED       No current facility-administered medications for this visit.        Changes to medications: Suheyb reports no changes at this time.    Allergies   Allergen Reactions   ??? Sulfa (Sulfonamide Antibiotics) Hives   ??? Sulfur Hives       Changes to allergies: No    SPECIALTY MEDICATION ADHERENCE     Dupixent - 0 left  Medication Adherence    Patient reported X missed doses in the last month: 0  Specialty Medication: Dupixent 300 mg/2 ml  Patient is on additional specialty medications: No          Specialty medication(s) dose(s) confirmed: Regimen is correct and unchanged.     Are there any concerns with adherence? No    Adherence counseling provided? Not needed    CLINICAL MANAGEMENT AND INTERVENTION      Clinical Benefit Assessment:    Do you feel the medicine is effective or helping your condition?  unclear, he reports some slight possible improvement    Clinical Benefit counseling provided? will have follow up with clinic soon    Adverse Effects Assessment:    Are you experiencing any side effects? No    Are you experiencing difficulty administering your medicine? No    Quality of Life Assessment:    How many days over the past month did your dermatitis  keep you from your normal activities? For example, brushing your teeth or getting up in the morning. 0    Have you discussed this with your provider? Not needed    Therapy Appropriateness:    Is therapy appropriate? Yes, therapy is appropriate and should be continued    DISEASE/MEDICATION-SPECIFIC INFORMATION      For patients on injectable medications: Patient currently has 0 doses left.  Next injection is scheduled for 5/9.    PATIENT SPECIFIC NEEDS     - Does the patient have any physical, cognitive, or cultural barriers? No    - Is the patient high risk? No    - Does the patient require a Care Management Plan? No     - Does the patient require physician intervention or other additional services (i.e. nutrition, smoking cessation, social work)? No      SHIPPING     Specialty Medication(s) to be Shipped:   Inflammatory Disorders: Dupixent    Other medication(s) to be shipped: No additional medications requested for fill at this time     Changes to insurance: No    Delivery Scheduled: Yes, Expected medication delivery date: 4/27.     Medication will be delivered via Same Day Courier to the confirmed prescription address in [facility].    The patient will receive a drug information handout for each medication shipped and additional FDA Medication Guides as required.  Verified that patient has previously received a Conservation officer, historic buildings.    All of the patient's questions and concerns have been addressed.    Lanney Gins   [facility] Specialty Pharmacist

## 2020-10-31 ENCOUNTER — Ambulatory Visit
Admit: 2020-10-31 | Discharge: 2020-11-01 | Payer: MEDICARE | Attending: Physician Assistant | Primary: Physician Assistant

## 2020-10-31 DIAGNOSIS — L309 Dermatitis, unspecified: Principal | ICD-10-CM

## 2020-10-31 MED ORDER — HALOBETASOL PROPIONATE 0.05 % TOPICAL OINTMENT
Freq: Two times a day (BID) | TOPICAL | 4 refills | 0.00000 days | Status: CP
Start: 2020-10-31 — End: 2021-10-31
  Filled 2020-11-01: qty 50, 25d supply, fill #0

## 2020-10-31 NOTE — Unmapped (Signed)
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 Biopsy 03/2020 with Epidermal acanthosis with focal spongiosis and prominent papillary dermal fibrosis.  - started Dupixent 05/2020 and has responded well   - Continue Dupixent 300 mg every 14 days   - Hold betamethasone, will start halobetasol   - Start halobetasol (ULTRAVATE) 0.05 % ointment; Apply topically Two (2) times a day. To active areas until smooth  - previouly Triamcinolone 0.1% Ointment worsened pruritus   - Reviewed side effects of topical corticosteroids including atrophy, striae and dyschromia; however, discussed the current light areas are PIH and secondary to eczema and not the medications  - Continue using Vaseline daily     The patient was advised to call for an appointment should any new, changing, or symptomatic lesions develop.     RTC: Return in about 3 months (around 01/31/2021) for f/u of eczema. or sooner as needed   _________________________________________________________________      Chief Complaint     Chief Complaint   Patient presents with   ??? Eczema     on elbown and legs       HPI     Timothy Tapia is a 55 y.o. male who presents as a returning patient (last seen by Dr. Francia Greaves on 08/22/2020) to [facility] for follow up of eczema. At last visit, patient was to continue Dupixent 300 mg every 2 weeks and continue betamethasone 0.05% ointment BID for eczematous dermatitis.    Today, patient notes the following    Eczema  - reports his eczema has gotten worse since his last visit despite treating with clobetasol and dupixent injections every two weeks  - endorses pruritus  - flare ups have been particularly severe on the legs     The patient denies any other new or changing lesions or areas of concern.     Pertinent Past Medical History     No history of skin cancer    Family History:   Negative for melanoma    Past Medical History, Family History, Social History, Medication List, Allergies, and Problem List were reviewed in the rooming section of Epic.     ROS: Other than symptoms mentioned in the HPI, no fevers, chills, or other skin complaints    Physical Examination     GENERAL: Well-appearing male in no acute distress, resting comfortably.  NEURO: Alert and oriented, answers questions appropriately  PSYCH: Normal mood and affect  SKIN: Examination of the face, abdomen, back, bilateral upper extremities and bilateral lower extremities was performed  - Eczematous plaques with PIH of the arms, legs, and lower abdomen    All areas not commented on are within normal limits or unremarkable    Scribe's Attestation: Sharene Butters, PA obtained and performed the history, physical exam and medical decision making elements that were entered into the chart.  Signed by Lynnell Grain, Scribe, on Oct 31, 2020 at 4:13 PM.    ----------------------------------------------------------------------------------------------------------------------  Nov 01, 2020 7:56 AM. Documentation assistance provided by the Scribe. I was present during the time the encounter was recorded. The information recorded by the Scribe was done at my direction and has been reviewed and validated by me.  ------------------------------------------------------------------------------------------------------------------    (Approved Template 03/13/2020)

## 2020-10-31 NOTE — Unmapped (Addendum)
For the eczema  Continue Dupixent injections every 2 weeks    Start halobetasol ointment. Apply two affected areas twice daily.    Continue to moisturize with Vaseline daily.

## 2020-11-01 NOTE — Unmapped (Signed)
Referral Services Note     Duration of Intervention: 50 minutes    REASON/TYPE OF CONTACT: Face to Face - In Person    ASSESSMENT: Timothy Tapia was recently hired at [employer] and is having trouble accessing the [employer] portal where he needs to complete paperwork before his orientation tomorrow.    INTERVENTION: Timothy Tapia and this SW called the [employer] customer service center to reset his password. Timothy Tapia was then able to access his account and complete the necessary paperwork.     PLAN: No further SW needs identified at this time.       Analeah Brame, LCSW, CCM

## 2020-11-10 ENCOUNTER — Ambulatory Visit: Admit: 2020-11-10 | Discharge: 2020-11-11 | Payer: MEDICARE

## 2020-11-10 DIAGNOSIS — H31002 Unspecified chorioretinal scars, left eye: Principal | ICD-10-CM

## 2020-11-10 MED ORDER — CETIRIZINE 10 MG TABLET
ORAL_TABLET | 2 refills | 0 days | Status: CP
Start: 2020-11-10 — End: ?

## 2020-11-10 NOTE — Unmapped (Signed)
Patient is here for an annual eye exam.   Last exam in 2018 with Dr. Eben Burow.     Has HIV- sees ID for this.  Was worked up for diabetes and tells me that he does not have diabetes.     1. HIV with no retinopathy  Observe     2.  Small chorioretinal scar superiorly left eye   Not visually significant, observe     3. Mild cataracts- not visually significant. I recommend cautious observation.    4. Myopia  Manifest refraction done and prescription given today.          Plan:  ??  Follow up 1 year with comprehensive/fellow clinic/medical retina     Extended ophthalmoscopy report (90D)  Retina- left:  Pigment spots  at 12 o'clock

## 2020-11-10 NOTE — Unmapped (Signed)
Referral Services Note     Duration of Intervention: 5 minutes    REASON/TYPE OF CONTACT: Phone    ASSESSMENT: Pt called from opthlamologist as he needs new glasses but medicaid wont pay as he had some within the past 2 years. He is asking for help getting another pair.     INTERVENTION:  SW asked pt to get his PD measured and bring his prescription so we can order some from [facility].     Pt came later and dropped off Rx. PD is 33/32.     SW entered application for [employer] [facility]. Pt wants the crystal Men's Bourbon lenses.     PLAN:  SW will order glasses shipped to pt home once approved.      Timothy Bienenstock LCSW, CHES  [facility] Lead Social Worker  Direct: [phone]  Main ID: [phone]

## 2020-11-17 MED FILL — MIRTAZAPINE 30 MG TABLET: ORAL | 30 days supply | Qty: 30 | Fill #8

## 2020-11-22 DIAGNOSIS — K863 Pseudocyst of pancreas: Principal | ICD-10-CM

## 2020-11-22 MED ORDER — CREON 24,000-76,000-120,000 UNIT CAPSULE,DELAYED RELEASE
ORAL_CAPSULE | 6 refills | 0 days
Start: 2020-11-22 — End: ?

## 2020-11-22 NOTE — Unmapped (Signed)
Refill request for Creon 24,000/76,000/120,00 capsule #90 R-5, last fill 11/06/2020, next follow up with Minerva Fester, MD on 12/11/2020

## 2020-11-23 NOTE — Unmapped (Signed)
[facility] Refill Coordination Note    Specialty Medication(s) to be Shipped:   Inflammatory Disorders: Dupixent    Other medication(s) to be shipped: No additional medications requested for fill at this time     Timothy Tapia, DOB: 13-Dec-1965  Phone: [phone] (work)      All above HIPAA information was verified with patient.     Was a Nurse, learning disability used for this call? No    Completed refill call assessment today to schedule patient's medication shipment from the [facility] [phone]).  All relevant notes have been reviewed.     Specialty medication(s) and dose(s) confirmed: Regimen is correct and unchanged.   Changes to medications: Timothy Tapia reports no changes at this time.  Changes to insurance: No  New side effects reported not previously addressed with a pharmacist or physician: None reported  Questions for the pharmacist: No    Confirmed patient received a Conservation officer, historic buildings and a Surveyor, mining with first shipment. The patient will receive a drug information handout for each medication shipped and additional FDA Medication Guides as required.       DISEASE/MEDICATION-SPECIFIC INFORMATION        For patients on injectable medications: Patient currently has 0 doses left.  Next injection is scheduled for 12/03/20.    SPECIALTY MEDICATION ADHERENCE     Medication Adherence    Patient reported X missed doses in the last month: 0  Specialty Medication: Dupixent 300 mg/2 ml  Patient is on additional specialty medications: No              Were doses missed due to medication being on hold? No      REFERRAL TO PHARMACIST     Referral to the pharmacist: Not needed      [facility]     Shipping address confirmed in Epic.     Delivery Scheduled: Yes, Expected medication delivery date: 11/29/20.     Medication will be delivered via Same Day Courier to the prescription address in Epic WAM.    Timothy Tapia   [facility] Specialty Technician

## 2020-11-24 MED ORDER — CREON 24,000-76,000-120,000 UNIT CAPSULE,DELAYED RELEASE
ORAL_CAPSULE | 6 refills | 0 days | Status: CP
Start: 2020-11-24 — End: ?

## 2020-11-29 MED FILL — DUPIXENT 300 MG/2 ML SUBCUTANEOUS PEN INJECTOR: SUBCUTANEOUS | 28 days supply | Qty: 4 | Fill #1

## 2020-12-11 ENCOUNTER — Ambulatory Visit
Admit: 2020-12-11 | Discharge: 2020-12-12 | Payer: MEDICARE | Attending: Infectious Disease | Primary: Infectious Disease

## 2020-12-11 DIAGNOSIS — E119 Type 2 diabetes mellitus without complications: Principal | ICD-10-CM

## 2020-12-11 DIAGNOSIS — B20 Human immunodeficiency virus [HIV] disease: Principal | ICD-10-CM

## 2020-12-11 LAB — COMPREHENSIVE METABOLIC PANEL
ALBUMIN: 3.8 g/dL (ref 3.4–5.0)
ALKALINE PHOSPHATASE: 117 U/L — ABNORMAL HIGH (ref 46–116)
ALT (SGPT): 10 U/L (ref 10–49)
ANION GAP: 9 mmol/L (ref 5–14)
AST (SGOT): 11 U/L (ref <=34)
BILIRUBIN TOTAL: 0.3 mg/dL (ref 0.3–1.2)
BLOOD UREA NITROGEN: 8 mg/dL — ABNORMAL LOW (ref 9–23)
BUN / CREAT RATIO: 12
CALCIUM: 9.4 mg/dL (ref 8.7–10.4)
CHLORIDE: 106 mmol/L (ref 98–107)
CO2: 26.7 mmol/L (ref 20.0–31.0)
CREATININE: 0.65 mg/dL
EGFR CKD-EPI (2021) MALE: >90 mL/min/{1.73_m2} (ref >=60)
GLUCOSE RANDOM: 85 mg/dL (ref 70–179)
POTASSIUM: 3.7 mmol/L (ref 3.4–4.8)
PROTEIN TOTAL: 6.9 g/dL (ref 5.7–8.2)
SODIUM: 142 mmol/L (ref 135–145)

## 2020-12-11 LAB — HEMOGLOBIN A1C
ESTIMATED AVERAGE GLUCOSE: 123 mg/dL
HEMOGLOBIN A1C: 5.9 % — ABNORMAL HIGH (ref 4.8–5.6)

## 2020-12-11 LAB — CBC W/ AUTO DIFF
BASOPHILS ABSOLUTE COUNT: 0.1 10*9/L (ref 0.0–0.1)
BASOPHILS RELATIVE PERCENT: 1.3 %
EOSINOPHILS ABSOLUTE COUNT: 0.2 10*9/L (ref 0.0–0.5)
EOSINOPHILS RELATIVE PERCENT: 3.4 %
HEMATOCRIT: 35.6 % — ABNORMAL LOW (ref 39.0–48.0)
HEMOGLOBIN: 12.2 g/dL — ABNORMAL LOW (ref 12.9–16.5)
LYMPHOCYTES ABSOLUTE COUNT: 1.6 10*9/L (ref 1.1–3.6)
LYMPHOCYTES RELATIVE PERCENT: 27.5 %
MEAN CORPUSCULAR HEMOGLOBIN CONC: 34.3 g/dL (ref 32.0–36.0)
MEAN CORPUSCULAR HEMOGLOBIN: 28.8 pg (ref 25.9–32.4)
MEAN CORPUSCULAR VOLUME: 83.9 fL (ref 77.6–95.7)
MEAN PLATELET VOLUME: 8.1 fL (ref 6.8–10.7)
MONOCYTES ABSOLUTE COUNT: 0.5 10*9/L (ref 0.3–0.8)
MONOCYTES RELATIVE PERCENT: 8.1 %
NEUTROPHILS ABSOLUTE COUNT: 3.5 10*9/L (ref 1.8–7.8)
NEUTROPHILS RELATIVE PERCENT: 59.7 %
NUCLEATED RED BLOOD CELLS: 0 /100{WBCs} (ref <=4)
PLATELET COUNT: 364 10*9/L (ref 150–450)
RED BLOOD CELL COUNT: 4.24 10*12/L — ABNORMAL LOW (ref 4.26–5.60)
RED CELL DISTRIBUTION WIDTH: 15.9 % — ABNORMAL HIGH (ref 12.2–15.2)
WBC ADJUSTED: 5.9 10*9/L (ref 3.6–11.2)

## 2020-12-11 LAB — LYMPH MARKER LIMITED,FLOW
ABSOLUTE CD3 CNT: 1248 {cells}/uL (ref 915–3400)
ABSOLUTE CD4 CNT: 832 {cells}/uL (ref 510–2320)
ABSOLUTE CD8 CNT: 416 {cells}/uL (ref 180–1520)
CD3% (T CELLS): 78 % (ref 61–86)
CD4% (T HELPER): 52 % (ref 34–58)
CD4:CD8 RATIO: 2 (ref 0.9–4.8)
CD8% T SUPPRESR: 26 % (ref 12–38)

## 2020-12-11 MED FILL — MIRTAZAPINE 30 MG TABLET: ORAL | 30 days supply | Qty: 30 | Fill #9

## 2020-12-11 NOTE — Unmapped (Signed)
PROMIS Tablet Screening  Completed Date: 12/11/20     SW reviewed self-administered screening.    Patient had a PHQ-9 score of 1  indicating None-Minimal depression.   Pt denies SI.   Pt scored 0 on AUDIT/AUDIT-C indicating Not-at-risk alcohol consumption  Pt  denies substance use in past 3 months.  Pt denies concerns for IPV.    Pt seen for regular ID visit.    Bradly Bienenstock LCSW, CHES  [facility] Social Work   Direct: [phone]  Main ID: [phone]

## 2020-12-11 NOTE — Unmapped (Signed)
PCP:  Lynnea Ferrier, MD    12/11/2020    This is a RETURN visit for this 55 y.o. male with the following diagnoses:    Patient Active Problem List    Diagnosis Date Noted   ??? Human immunodeficiency virus (HIV) disease (CMS-HCC) 02/04/1995   ??? Chronic bilateral low back pain 11/09/2019   ??? COPD (chronic obstructive pulmonary disease) (CMS-HCC) 05/27/2017   ??? Hypokalemia 05/27/2017   ??? GERD (gastroesophageal reflux disease) 05/27/2017   ??? Pancreatic pseudocyst 07/06/2015   ??? GI bleeding 07/06/2015   ??? ETOH abuse 07/06/2015   ??? DVT (deep venous thrombosis), IMV thrombus 10/07/13 provoked per hematology (41mo therapy) 10/25/2013   ??? Colitis 10/12/2013   ??? Pancreatitis 02/15/2013   ??? Tobacco use disorder 02/15/2013   ??? HTN (hypertension) 02/15/2013   ??? Barrett esophagus 02/15/2013   ??? Asthma 02/15/2013   ??? 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 CD4 is1000+  ?? Persistently undetectable HIV RNA but was 51 in sept  ?? Says adherent .  ?? On Prezcobix + Dolutegravir   ? 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 Tolerates current regimen.  ? Booster not desirable but no great alt option right now.    Substance Use  ?? 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 Was at  [facility]    ?? Says he is still sober  ?? Will meet with SW who has been supportive  ?? Working at [employer] pain  ?? 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  Impingement the descending S1 nerve root  ?? Has seen [facility]  ?? When substance use issues settled can return  ?? Gabapentin 900 mg at bedtime is helping he says. He notices that his pain is worse and mobility tougher when he is off of this med.     Hx Pancreatitis - Now quiescent   ?? Old MRI shown that prior pancreatic pseudocyst resolved  ?? On Creon  ?? A1C was down last visit to 6.5%  ?? Denies polydipsia/phagia or frequent urination  ??  Barrett's Espohagitis  ?? On PPI  ??  Smoking   ?? Smoking has increased as he has been dealing with addiction issues  ??  Mental Health  ?? See above  ?? On Remeron 30 mg at bedtime   ?? Watch for excessive somnolence given this + gabapentin.  ??  Health Maintenance   ?? Immunizations: UTD. HBV immune. HCV antibody positive but HCV viral load negative so not infected. C19 fully vaccinated and boosted x 2. Refuses flu shot.   ?? Cancer Screening: Colonoscopy in 2017. Multiple polyps. Repeat. Barrett's so EGDs  ?? HTN: Controlled today. Will watch. On ACEi plus Ca++ bloocker.     Will follow up in 8 months.       Chief Complaint: Routine HIV follow-up    HPI:  Mr. Timothy Tapia returns for a routine appointment. See detailed A/P.     HIV-wise: Mostly stable CD4 and HIV RNA. Reports good adherence to ART.      ROS:   No fever chills, sweats, nausea, vomiting, diarrhea, rash, headache, joint pain. Neg for epigastric pain. Pos for LBP, depression    All other systems are negative.    ALLERGIES:  Sulfa (sulfonamide antibiotics) and Sulfur  MEDICATIONS:  Current Outpatient Medications   Medication Sig Dispense Refill   ??? amLODIPine (NORVASC) 5 MG tablet TAKE ONE TABLET BY MOUTH DAILY 30 tablet 11   ??? cetirizine (ZYRTEC) 10 MG tablet TAKE ONE TABLET BY MOUTH EVERY DAY 30 tablet 2   ??? CREON 24,000-76,000 -120,000 unit CpDR delayed release capsule TAKE 1 CAPSULE BY MOUTH THREE TIMES DAILY WITH A MEAL 90 capsule 6   ??? dupilumab (DUPIXENT PEN) 300 mg/2 mL PnIj Inject the contents of 1 pen (300 mg) under the skin every fourteen (14) days. 4 mL 5   ??? empty container Misc Use as directed to dispose of Dupixent pens. 1 each 2   ??? enalapril (VASOTEC) 20 MG tablet TAKE 1 TABLET BY MOUTH EVERY DAY 30 tablet 11   ??? esomeprazole (NEXIUM) 40 MG capsule TAKE 1 CAPSULE BY MOUTH EVERY MORNING BEFORE BREAKFAST. 30 capsule 11   ??? gabapentin (NEURONTIN) 300 MG capsule TAKE ONE CAPSULE BY MOUTH 3 TIMES A DAY 270 capsule 2   ??? halobetasol (ULTRAVATE) 0.05 % ointment APPLY TO AFFECTED AREA TWICE A DAY AS NEEDED AVOID FACE AND FOLDS 30 g 1   ??? halobetasol (ULTRAVATE) 0.05 % ointment Apply topically Two (2) times a day. To active areas until smooth 100 g 4   ??? mirtazapine (REMERON) 30 MG tablet Take 1 tablet (30 mg total) by mouth daily. 30 tablet 11   ??? PREZCOBIX 800-150 mg-mg tablet TAKE ONE TABLET BY MOUTH DAILY 30 tablet 11   ??? TIVICAY 50 mg TABLET TAKE ONE TABLET BY MOUTH DAILY 30 tablet 11   ??? VENTOLIN HFA 90 mcg/actuation inhaler INHALE 2 PUFFS UP TO FOUR TIMES DAILY AS NEEDED     ??? betamethasone, augmented, (DIPROLENE) 0.05 % ointment Apply twice daily to hands and then cover with gloves (Patient not taking: Reported on 12/11/2020) 50 g 6   ??? triamcinolone (KENALOG) 0.1 % ointment Apply to itchy spots 2 times daily. For elbows at night time, apply medicine and then cover with saran wrap. (Patient not taking: Reported on 12/11/2020) 454 g 0     No current facility-administered medications for this visit.     PE:  A&A. NAD. Chest CTA, Heart RRR. EXT w/o edema    DATA:    Results for orders placed or performed in visit on 06/12/20   Hemoglobin A1c   Result Value Ref Range    Hemoglobin A1C 6.5 (H) 4.8 - 5.6 %    Estimated Average Glucose 140 mg/dL     *Note: Due to a large number of results and/or encounters for the requested time period, some results have not been displayed. A complete set of results can be found in Results Review.        Creatinine   Date Value Ref Range Status   04/04/2020 0.90 0.70 - 1.30 mg/dL Final   96/09/5407 8.11 0.70 - 1.30 mg/dL Final   91/47/8295 6.21 0.70 - 1.30 mg/dL Final   30/86/5784 6.96 0.60 - 1.10 mg/dL Final   29/52/8413 2.44 0.76 - 1.27 mg/dL Final   07/03/7251 6.64 0.70 - 1.30 mg/dL Final   40/34/7425 9.56 0.70 - 1.30 mg/dL Final   38/75/6433 2.95 0.70 - 1.30 mg/dL Final       Triglycerides   Date Value Ref Range Status   03/20/2020 180 (H) 0 - 150 mg/dL Final   18/84/1660 630 1 - 149 mg/dL Final     HDL   Date Value Ref Range Status  03/20/2020 40 40 - 60 mg/dL Final   29/56/2130 79 (H) 40 - 59 mg/dL Final     LDL Calculated   Date Value Ref Range Status   03/20/2020 123 (H) 40 - 99 mg/dL Final     Comment:     NHLBI Recommended Ranges, LDL Cholesterol, for Adults (20+yrs) (ATPIII), mg/dL  Optimal              <865  Near Optimal        100-129  Borderline High     130-159  High                160-189  Very High            >=190  NHLBI Recommended Ranges, LDL Cholesterol, for Children (2-19 yrs), mg/dL  Desirable            <784  Borderline High     110-129  High                 >=130       LDL Cholesterol, Calculated   Date Value Ref Range Status   02/15/2013 100 mg/dL Final     Comment:     :  ADULTS (20 years or older)  Optimal         <100  Near Optimal    100-129  Borderline High 130-159  High            160-189  Very High       >=190  CHILDREN (2-19 years)  Desirable       <110  Borderline High 110-129  High            >/= 130       IMAGING STUDIES:  None

## 2020-12-12 MED FILL — HALOBETASOL PROPIONATE 0.05 % TOPICAL OINTMENT: TOPICAL | 25 days supply | Qty: 50 | Fill #1

## 2020-12-13 LAB — HIV RNA, QUANTITATIVE, PCR: HIV RNA QNT RSLT: NOT DETECTED

## 2020-12-15 NOTE — Unmapped (Signed)
Referral Services Note     Duration of Intervention: 5 minutes    REASON/TYPE OF CONTACT: Phone    ASSESSMENT: Pt called to see when his glasses might be arriving. He also asked to talk with a nurse about his elevated AIC on recent blood work.    INTERVENTION:  Reached out to Lead Child psychotherapist, Bradly Bienenstock who had submitted an application to [employer] for the [employer]. Irving Burton confirmed Pt's application had been accepted and that he should be receiving his glasses in about 2 weeks via mail to his home address.    Requested nurse, Rayburn Ma follow up with the Pt about his blood work results.     PLAN:  Notified Pt that his glasses would be arriving in about 2 weeks and that he could expect follow up from his nurse. Pt will follow up as needed.     Lyman Speller, Darcey Nora, LCSW  [facility] Youth Social Worker  Direct: [phone]  Main ID: [phone]

## 2020-12-21 NOTE — Unmapped (Signed)
Pt. Left VM on the nurse line requesting a call back, but did not say what was needed. RN called patient back, and there was no answer at the time of the call, left VM.

## 2020-12-27 NOTE — Unmapped (Signed)
[facility] Refill Coordination Note    Specialty Medication(s) to be Shipped:   Inflammatory Disorders: Dupixent    Other medication(s) to be shipped: No additional medications requested for fill at this time     Timothy Tapia, DOB: 07/05/1965  Phone: [phone] (work)      All above HIPAA information was verified with patient.     Was a Nurse, learning disability used for this call? No    Completed refill call assessment today to schedule patient's medication shipment from the [facility] [phone]).  All relevant notes have been reviewed.     Specialty medication(s) and dose(s) confirmed: Regimen is correct and unchanged.   Changes to medications: Timothy Tapia reports no changes at this time.  Changes to insurance: No  New side effects reported not previously addressed with a pharmacist or physician: None reported  Questions for the pharmacist: No    Confirmed patient received a Conservation officer, historic buildings and a Surveyor, mining with first shipment. The patient will receive a drug information handout for each medication shipped and additional FDA Medication Guides as required.       DISEASE/MEDICATION-SPECIFIC INFORMATION        For patients on injectable medications: Patient currently has 0 doses left.  Next injection is scheduled for 01/09/2021.    SPECIALTY MEDICATION ADHERENCE     Medication Adherence    Patient reported X missed doses in the last month: 0  Specialty Medication: Dupixent 300 mg/2 ml  Patient is on additional specialty medications: No  Any gaps in refill history greater than 2 weeks in the last 3 months: no  Demonstrates understanding of importance of adherence: yes  Informant: patient  Reliability of informant: reliable  Confirmed plan for next specialty medication refill: delivery by pharmacy  Refills needed for supportive medications: not needed              Were doses missed due to medication being on hold? No      REFERRAL TO PHARMACIST     Referral to the pharmacist: Not needed      [facility]     Shipping address confirmed in Epic.     Delivery Scheduled: Yes, Expected medication delivery date: 12/29/2020.     Medication will be delivered via Same Day Courier to the prescription address in Epic WAM.    Timothy Tapia Timothy Tapia D Timothy Tapia   [facility] Shared [facility] Specialty Technician

## 2020-12-29 MED FILL — DUPIXENT 300 MG/2 ML SUBCUTANEOUS PEN INJECTOR: SUBCUTANEOUS | 28 days supply | Qty: 4 | Fill #2

## 2021-01-08 DIAGNOSIS — G8929 Other chronic pain: Principal | ICD-10-CM

## 2021-01-08 DIAGNOSIS — M545 Chronic bilateral low back pain, unspecified whether sciatica present: Principal | ICD-10-CM

## 2021-01-08 NOTE — Unmapped (Signed)
Pt. Called requesting a referral to the spine clinic for injections. Pt. States that he was seen by Dr. Sabino Gasser, but needs a new referral.    Message forwarded to provider via epic inbasket

## 2021-01-19 MED FILL — HALOBETASOL PROPIONATE 0.05 % TOPICAL OINTMENT: TOPICAL | 25 days supply | Qty: 50 | Fill #2

## 2021-01-19 MED FILL — MIRTAZAPINE 30 MG TABLET: ORAL | 30 days supply | Qty: 30 | Fill #10

## 2021-01-26 ENCOUNTER — Ambulatory Visit: Admit: 2021-01-26 | Payer: MEDICARE | Attending: Physician Assistant | Primary: Physician Assistant

## 2021-01-26 NOTE — Unmapped (Deleted)
Please disregard - encounter created in error

## 2021-02-13 ENCOUNTER — Ambulatory Visit: Admit: 2021-02-13 | Discharge: 2021-02-14 | Payer: MEDICARE

## 2021-02-13 DIAGNOSIS — M545 Chronic bilateral low back pain, unspecified whether sciatica present: Principal | ICD-10-CM

## 2021-02-13 DIAGNOSIS — G8929 Other chronic pain: Principal | ICD-10-CM

## 2021-02-13 MED ORDER — CETIRIZINE 10 MG TABLET
ORAL_TABLET | 2 refills | 0 days
Start: 2021-02-13 — End: ?

## 2021-02-13 NOTE — Unmapped (Signed)
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  Impingement the descending S1 nerve root    Plan:  -Continue activity to tolerance  -Continue topical and OTC medications as well as gabapentin as directed by PCP  -Plan for Right S1 TFESI ordered today  -Would benefit from referral to integrative pain management; Referral to Dr. Charletta Cousin with PIM placed today.     Subjective:     Chief Complaint: Timothy Tapia with PMH HIV, HTN, and substance abuse who presents for continued low back and leg pain    History of Present Illness: Patient is a very pleasant 55 year old male with returned low back and leg pain. He previously had an injection with Dr. Comer Locket in April 2021 with significant improvement in his pain (60% relief for 3-4 months). Aching low back pain which radiates down the posterior aspect of both legs to the feet worse on the right side. He continues to take gabapentin and BC powder. He has not had any saddle anesthesia, weakness, or incontinence. He remains active.          Current Outpatient Medications:   ???  amLODIPine (NORVASC) 5 MG tablet, TAKE ONE TABLET BY MOUTH DAILY, Disp: 30 tablet, Rfl: 11  ???  cetirizine (ZYRTEC) 10 MG tablet, TAKE ONE TABLET BY MOUTH EVERY DAY, Disp: 30 tablet, Rfl: 2  ???  CREON 24,000-76,000 -120,000 unit CpDR delayed release capsule, TAKE 1 CAPSULE BY MOUTH THREE TIMES DAILY WITH A MEAL, Disp: 90 capsule, Rfl: 6  ???  dupilumab (DUPIXENT PEN) 300 mg/2 mL PnIj, Inject the contents of 1 pen (300 mg) under the skin every fourteen (14) days., Disp: 4 mL, Rfl: 5  ???  empty container Misc, Use as directed to dispose of Dupixent pens., Disp: 1 each, Rfl: 2  ???  enalapril (VASOTEC) 20 MG tablet, TAKE 1 TABLET BY MOUTH EVERY DAY, Disp: 30 tablet, Rfl: 11  ???  esomeprazole (NEXIUM) 40 MG capsule, TAKE 1 CAPSULE BY MOUTH EVERY MORNING BEFORE BREAKFAST., Disp: 30 capsule, Rfl: 11  ???  gabapentin (NEURONTIN) 300 MG capsule, TAKE ONE CAPSULE BY MOUTH 3 TIMES A DAY, Disp: 270 capsule, Rfl: 2  ???  halobetasol (ULTRAVATE) 0.05 % ointment, APPLY TO AFFECTED AREA TWICE A DAY AS NEEDED AVOID FACE AND FOLDS, Disp: 30 g, Rfl: 1  ???  halobetasol (ULTRAVATE) 0.05 % ointment, Apply topically Two (2) times a day. To active areas until smooth, Disp: 100 g, Rfl: 4  ???  mirtazapine (REMERON) 30 MG tablet, Take 1 tablet (30 mg total) by mouth daily., Disp: 30 tablet, Rfl: 11  ???  PREZCOBIX 800-150 mg-mg tablet, TAKE ONE TABLET BY MOUTH DAILY, Disp: 30 tablet, Rfl: 11  ???  TIVICAY 50 mg TABLET, TAKE ONE TABLET BY MOUTH DAILY, Disp: 30 tablet, Rfl: 11  ???  VENTOLIN HFA 90 mcg/actuation inhaler, INHALE 2 PUFFS UP TO FOUR TIMES DAILY AS NEEDED, Disp: , Rfl:   ???  betamethasone, augmented, (DIPROLENE) 0.05 % ointment, Apply twice daily to hands and then cover with gloves (Patient not taking: No sig reported), Disp: 50  g, Rfl: 6  ???  triamcinolone (KENALOG) 0.1 % ointment, Apply to itchy spots 2 times daily. For elbows at night time, apply medicine and then cover with saran wrap. (Patient not taking: No sig reported), Disp: 454 g, Rfl: 0     Objective:     Physical Exam:  General: No acute distress  Psychiatric: affect appropriate, thought process logical   HEENT: Normcephalic, atraumatic  Respiratory: No increased WOB, no retractions  Musculoskeletal:    Lumbar Spine:  Inspection: No erythema, discoloration, or asymmetry.  Palpation: No paraspinal muscle tenderness.  Low vertebral body point tenderness.  ROM: reduced flexion, extension, rotation with reproduction of pain.                        Neurologic:   Mental Status: Alert, naming intact and follows commands appropriately    Other: No clonus at bilateral ankles       Hip Flexion (L2) Knee Extension (L3) Dorsiflexion (L4) EHL (L5) Plantar Flexion  (S1)   RLE 5 5 5 5 5    LLE 5 5 5 5 5       Patellar Achilles   Left 2+ 2+   Right 2+ 2+

## 2021-02-13 NOTE — Unmapped (Signed)
I provided a copy of the injection instruction sheet and reviewed with the patient to prepare for a spinal injection. The patient knows: to bring a driver, that it is OK to eat prior to appointment, to arrive 30 mins. prior to appointment and, to call and R/S if there are s/s of illness or if antibiotics have been started for any reason.

## 2021-02-13 NOTE — Unmapped (Signed)
Thank you for visiting the [facility] today, we're sorry you haven't been doing as well since your last visit.  Please reach out to [place] via phone: [phone] or MyChart message with any questions.  We have ordered an updated MRI and a shot to help with your pain.  We have referred you to a doctor to talk about strategies to cope with your chronic pain without medications.

## 2021-02-13 NOTE — Unmapped (Signed)
Addended by: Phillis Haggis on: 02/13/2021 10:17 AM     Modules accepted: Orders

## 2021-02-14 NOTE — Unmapped (Signed)
I saw and evaluated the patient, participating in the key portions of the service.  I reviewed the resident???s note.  I agree with the resident???s findings and plan. Redge Gainer, DO

## 2021-02-15 NOTE — Unmapped (Signed)
Pt. Called stating that the pharmacy is not able to fill his Esomeprazole, but he was unsure of why. RN contacted pharmacy who states that the medication is no longer on formulary, and needs a PA. Above information was communicated with the patient. Pt. Reports he is willing to try other meds. Message forwarded to provider.

## 2021-02-16 MED FILL — HALOBETASOL PROPIONATE 0.05 % TOPICAL OINTMENT: TOPICAL | 25 days supply | Qty: 50 | Fill #3

## 2021-02-16 MED FILL — MIRTAZAPINE 30 MG TABLET: ORAL | 30 days supply | Qty: 30 | Fill #11

## 2021-02-16 NOTE — Unmapped (Signed)
Referral Services Note     Duration of Intervention: 5 minutes    TYPE OF CONTACT: Phone    ASSESSMENT: Pt called stating that he has a finger prick test tomorrow for the [employer]. He asked if this was for section 8.     INTERVENTION:  SW informed pt that we are unsure and he should ask them when he gets there.     PLAN:  Pt will ask housing authority what this is for.      Bradly Bienenstock LCSW, CHES  [facility] Lead Social Worker

## 2021-02-16 NOTE — Unmapped (Signed)
[facility] Refill Coordination Note    Specialty Medication(s) to be Shipped:   Inflammatory Disorders: Dupixent    Other medication(s) to be shipped: No additional medications requested for fill at this time     Timothy Tapia, DOB: Jun 20, 1966  Phone: [phone] (home) [phone] (work)      All above HIPAA information was verified with patient.     Was a Nurse, learning disability used for this call? No    Completed refill call assessment today to schedule patient's medication shipment from the [facility] [phone]).  All relevant notes have been reviewed.     Specialty medication(s) and dose(s) confirmed: Regimen is correct and unchanged.   Changes to medications: [facility] reports no changes at this time.  Changes to insurance: No  New side effects reported not previously addressed with a pharmacist or physician: None reported  Questions for the pharmacist: No    Confirmed patient received a Conservation officer, historic buildings and a Surveyor, mining with first shipment. The patient will receive a drug information handout for each medication shipped and additional FDA Medication Guides as required.       DISEASE/MEDICATION-SPECIFIC INFORMATION        For patients on injectable medications: Patient currently has 0 doses left.  Next injection is scheduled for 02/19/21.    SPECIALTY MEDICATION ADHERENCE     Medication Adherence    Patient reported X missed doses in the last month: 0  Specialty Medication: dupilumab (DUPIXENT PEN) 300 mg/2 mL PnIj  Patient is on additional specialty medications: No              Were doses missed due to medication being on hold? No      REFERRAL TO PHARMACIST     Referral to the pharmacist: Not needed      [facility]     Shipping address confirmed in Epic.     Delivery Scheduled: Yes, Expected medication delivery date: 02/19/21.     Medication will be delivered via Same Day Courier to the prescription address in Epic WAM.    Timothy Tapia   [facility] Shared [facility] Specialty Technician

## 2021-02-19 MED ORDER — PANTOPRAZOLE 20 MG TABLET,DELAYED RELEASE
ORAL_TABLET | Freq: Every day | ORAL | 3 refills | 90.00000 days | Status: CP
Start: 2021-02-19 — End: 2022-02-19
  Filled 2021-02-19: qty 90, 90d supply, fill #0

## 2021-02-19 MED FILL — DUPIXENT 300 MG/2 ML SUBCUTANEOUS PEN INJECTOR: SUBCUTANEOUS | 28 days supply | Qty: 4 | Fill #3

## 2021-02-19 NOTE — Unmapped (Signed)
Name: Nasiah Lehenbauer  Date: 02/19/2021  Address: [address]  [place] [address]   [place] of Residence:  [place]  Phone: [phone]     Started assessment with patient options: in clinic     Is this the same address for mailing? Yes  If No, Mailing Address is:     Librarian, academic, Medicare Part A and Medicare Part B    Tax Filing Status  I did not file taxes     Employment Status  Medically Unable to Work    Income  Tree surgeon (Retirement/Survivor's/Disability)    If no or low income, how are you meeting your basic needs?  Food Stamps/EBT and Housing Assistance    List Tax Household Members including relationship to you:   n/a    Someone in my household receives: No Household Income/Deductions of any kind  Specify who: n/a    Do you have a current diagnosis for Hepatitis C?  Lab Results   Component Value Date    HEPCAB Nonreactive 03/23/2019       Have you used tobacco products four or more times per week in the last six months?  Yes    Teacher, adult education  Patient has affordable insurance through [employer], [place], and or Employment and is not eligible.    Patient given ACA education if they qualified based on answers to questions above.     Patient was informed of the following programs;   N/A    The following applications/handouts were given to patient:   N/A    The following forms were also started with the patient:   N/A    Juanell Fairly application status: Incomplete; patient needs to send 2022 SSDI Letter     Patient is applying for [employer] on Charges Only     Additional Comments: Pt would like to come tomorrow       Bradly Bienenstock LCSW, CHES  [facility] Lead Social Worker

## 2021-02-20 NOTE — Unmapped (Signed)
Patient completed RW paperwork. Patient is eligible for RW B&C grant services and [employer] on Charges. IPL= 83%;FPL= 83%. Expires: 02/19/22    Bradly Bienenstock LCSW, CHES  [facility] Lead Social Worker

## 2021-02-20 NOTE — Unmapped (Signed)
Referral Services Note     Duration of Intervention: 15 minutes    TYPE OF CONTACT: Face to Face - In Person    ASSESSMENT: Pt came to clinic for food assistance.     INTERVENTION:  SW gave RW food bag and snack. Referred pt to [facility] food delivery program.     PLAN:  [facility] will call pt to organize food box delivery.     Bradly Bienenstock LCSW, CHES  [facility] Lead Social Worker

## 2021-02-20 NOTE — Unmapped (Signed)
Social Work Environmental consultant    1. Patient income level: <300% FPL    2. Has the patient's income changed since your last RW application due to COVID-19 or other reasons? stayed the same    3.  Has the patient completed RW enrollment paperwork: Yes      *If no, have the patient speak with a RW benefit coordinator before they can use food pantry*    4.  What local food resources has the patient tried to access and what were the results? Has food stamps but not enough, no car to drive to local food pantries.     5. The patient is Eligible for the Food Pantry Program.     If patient meets above criteria and there are no alternative options for food assistance, then the patient can receive assistance through the [facility].      Extra Screening:     I'm going to read you two statements that people have made about their food situation. For each statement, please tell me whether the statement was often true, sometimes true, or never true for your household in the last month.      1. ???We worried whether our food would run out before we got money to buy more.???   sometimes     2. ???The food that we bought just didn't last, and we didn't have money to get more.  sometimes    3. Do you follow a special diet?  No  4. Do you have a place to store food?  Yes  5. Do you have a place to cook food? Yes         Social Work Tree surgeon    1. Patient income level: <300% FPL    2. Has the patient's income changed since your last RW application due to COVID-19 or other reasons? stayed the same    3.  Has the patient completed RW enrollment paperwork: Yes      *If no, have the patient speak with a RW benefits coordinator before they can use food pantry*    4. The patient is Eligible for the Food Pantry Program.     If patient meets above criteria and there are no alternative options for food assistance, then the patient can receive assistance through the [facility].      **Patient has received 1 snack bag on 02/20/2021 distributed by: Ardelle Anton LCSW, CHES   [facility] Lead Social Worker

## 2021-02-21 NOTE — Unmapped (Signed)
Medical Case Management Social Work Assessment & Care Plan Note     Duration of Intervention: 5 minutes    REASON/TYPE OF CONTACT: Phone     Pt called asking about his next appt date. SW provided. Pt often calls for care coordination and resource support so enrolling in case management.     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   Transportation: Not a specified need at this time  Social Support System: Not a specified need at this time  Food/Nutrition, Civil Service fast streamer, Utilities: Yes, describe: sometimes needs food assistance.     ** PREVENTION NEEDS **  Risk Reduction Counseling Needs: Not a specified need at this time  Disclosure to [employer]: Not a specified need at this time    ** LEGAL NEEDS **  Not a specified need at this time    ** RESOURCE COORDINATION **  Navigating social/health care systems; Care Coordination: Yes, describe: may need reminders.     **ACTIVITIES OF DAILY LIVING  ADL assistance: Not a specified need at this time      Enrolled in Medical Case Management:  yes      Medical Case Management Care Plan  Resource Navigation     NEED: Patient has identified potential/current social barriers to treatment adherence and will need assistance with navigating social/health care systems       GOAL: To decrease/eliminate social barriers for HIV treatment and overall healthcare     PERSON: Nurse, Market researcher, Medical Provider, Social Worker, Garment/textile technologist and governmental agencies     INTERVENTIONS:    1. Social Worker will assist patient with navigating healthcare/social systems   2. Social Worker will locate resources that can help with pt's identified need    3. Social Worker will provide patient with these community resources   4. Patient will be responsible for contacting agencies to request assistance   5. Patient will contact social worker if further coordination is needed and SW will assist   6. Social Worker will reassess patient's need for continued coordination services within 6 months     OUTCOME:    1. Staff documentation in EPIC of barriers, progression, and efforts    2. Reassess when necessary or at the minimum every six months for continued need    This patient is being provided a service through [employer] of resort. A benefits investigation was done by social work staff and the patient was found to have no insurance or that their health insurance does not cover the services necessary for patient success. In some cases, if health insurance does cover the service, social work staff serve as interim providers until a long term service provider can be found in cases where current service providers are not accessible due to affordability, availability, or specialist knowledge.  Bradly Bienenstock LCSW, CHES  [facility] Lead Social Worker

## 2021-02-22 NOTE — Unmapped (Signed)
Medical Case Management Social Work Note     Duration of Intervention: 5 minutes    TYPE OF CONTACT: Phone    ASSESSMENT: Pt called asking for pain study coordinator to call him as he was supposed to receive a gift card on Tuesday but hasn't yet.     ADHERENCE: Did not discuss at this interaction due to competing priorities and/or no concerns.    INTERVENTION:  SW sent message to Ancil Boozer and Alois Cliche re: this.     PLAN:  Research will problem solve.       Bradly Bienenstock LCSW, CHES  [facility] Lead Social Worker

## 2021-02-23 MED ORDER — CETIRIZINE 10 MG TABLET
ORAL_TABLET | 2 refills | 0 days | Status: CP
Start: 2021-02-23 — End: ?

## 2021-02-26 ENCOUNTER — Telehealth: Admit: 2021-02-26 | Payer: MEDICARE

## 2021-02-26 NOTE — Unmapped (Signed)
Integrative medicine consult     Referred by Dr. Franco Nones     A/P: 55 yo M with chronic pain. Abbreviated phone visit today. Discussed Integrative medical group visit, similarities and differences from past experiences with pain management group. He will consider and message me in My Chart if he decides to join the group.     S/O: he was seen at 1 pm today at ID clinic, we connected after his appointment time for a phone visit.      H/o chronic pain.     He has participated in pain management groups in the past.     Patient Active Problem List   Diagnosis   ??? Chorioretinal scar   ??? Human immunodeficiency virus (HIV) disease (CMS-HCC)   ??? Pancreatitis   ??? Tobacco use disorder   ??? HTN (hypertension)   ??? Barrett esophagus   ??? Asthma   ??? Colitis   ??? DVT (deep venous thrombosis), IMV thrombus 10/07/13 provoked per hematology (26mo therapy)   ??? Pancreatic pseudocyst   ??? GI bleeding   ??? ETOH abuse   ??? COPD (chronic obstructive pulmonary disease) (CMS-HCC)   ??? Hypokalemia   ??? GERD (gastroesophageal reflux disease)   ??? Chronic bilateral low back pain     Current Outpatient Medications on File Prior to Visit   Medication Sig Dispense Refill   ??? amLODIPine (NORVASC) 5 MG tablet TAKE ONE TABLET BY MOUTH DAILY 30 tablet 11   ??? betamethasone, augmented, (DIPROLENE) 0.05 % ointment Apply twice daily to hands and then cover with gloves (Patient not taking: No sig reported) 50 g 6   ??? cetirizine (ZYRTEC) 10 MG tablet TAKE ONE TABLET BY MOUTH EVERY DAY 30 tablet 2   ??? CREON 24,000-76,000 -120,000 unit CpDR delayed release capsule TAKE 1 CAPSULE BY MOUTH THREE TIMES DAILY WITH A MEAL 90 capsule 6   ??? dupilumab (DUPIXENT PEN) 300 mg/2 mL PnIj Inject the contents of 1 pen (300 mg) under the skin every fourteen (14) days. 4 mL 5   ??? empty container Misc Use as directed to dispose of Dupixent pens. 1 each 2   ??? enalapril (VASOTEC) 20 MG tablet TAKE 1 TABLET BY MOUTH EVERY DAY 30 tablet 11   ??? gabapentin (NEURONTIN) 300 MG capsule TAKE ONE CAPSULE BY MOUTH 3 TIMES A DAY 270 capsule 2   ??? halobetasol (ULTRAVATE) 0.05 % ointment APPLY TO AFFECTED AREA TWICE A DAY AS NEEDED AVOID FACE AND FOLDS 30 g 1   ??? halobetasol (ULTRAVATE) 0.05 % ointment Apply topically Two (2) times a day. To active areas until smooth 100 g 4   ??? mirtazapine (REMERON) 30 MG tablet Take 1 tablet (30 mg total) by mouth daily. 30 tablet 11   ??? pantoprazole (PROTONIX) 20 MG tablet Take 1 tablet (20 mg total) by mouth daily. 90 tablet 3   ??? PREZCOBIX 800-150 mg-mg tablet TAKE ONE TABLET BY MOUTH DAILY 30 tablet 11   ??? TIVICAY 50 mg TABLET TAKE ONE TABLET BY MOUTH DAILY 30 tablet 11   ??? triamcinolone (KENALOG) 0.1 % ointment Apply to itchy spots 2 times daily. For elbows at night time, apply medicine and then cover with saran wrap. (Patient not taking: No sig reported) 454 g 0   ??? VENTOLIN HFA 90 mcg/actuation inhaler INHALE 2 PUFFS UP TO FOUR TIMES DAILY AS NEEDED       No current facility-administered medications on file prior to visit.     GEN: No acute  distress  RESP:  Speaks in full sentences  NEURO: Alert, fluent speech.          The patient reports they are currently: not at home. I spent 10 minutes on the phone with the patient on the date of service. I spent an additional 10 minutes on pre- and post-visit activities on the date of service.     The patient was physically located in [place] or a state in which I am permitted to provide care. The patient and/or parent/guardian understood that s/he may incur co-pays and cost sharing, and agreed to the telemedicine visit. The visit was reasonable and appropriate under the circumstances given the patient's presentation at the time.    The patient and/or parent/guardian has been advised of the potential risks and limitations of this mode of treatment (including, but not limited to, the absence of in-person examination) and has agreed to be treated using telemedicine. The patient's/patient's family's questions regarding telemedicine have been answered.     If the visit was completed in an ambulatory setting, the patient and/or parent/guardian has also been advised to contact their provider???s office for worsening conditions, and seek emergency medical treatment and/or call 911 if the patient deems either necessary.

## 2021-02-26 NOTE — Unmapped (Signed)
Hello,    It was nice to speak with you today.  If you decide that you would like to join the Integrative Medical Group Visit, please respond to this message to let me know your interest.     The group meets once a week for eight weeks over Zoom.  The group is a combination of medical visit, group discussion, meditation training, nutrition education, and other integrative skills training.  The group is covered by insurance, but your insurance may require a copayment each visit.    Please let me know if you have any questions or concerns.    Take care,    Shanda Bumps

## 2021-02-26 NOTE — Unmapped (Signed)
Referral Services Note     Duration of Intervention: 30 minutes    TYPE OF CONTACT: Face to Face - In Person    ASSESSMENT: Pt reported concerns of conflict regarding [facility] and requirements. Pt reports he has been asked to work while staying in his [employer]. Pt reports he is unable to work due to disability status. Pt requested information and a print out of his upcoming medical apts. Pt reports he will present his print outs of upcoming medical apts. as proof that he is unable to work due to health related reasons.    INTERVENTION: SW provided active listening, validation, empathic feedback, unconditional positive regard, and congruence within the relationship. SW informed pt of upcoming apts. and referred pt to go to check-in desk to receive a print out of his upcoming appointments.      PLAN: Pt will attend his upcoming appointments as scheduled.      Luella Cook, LCSW-A, LCAS-A, NCTTP  Social Work Practitioner - [facility]
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
## 2021-02-28 NOTE — Unmapped (Signed)
Medical Case Management Social Work Note     Duration of Intervention: 5 minutes    TYPE OF CONTACT: Phone    ASSESSMENT: Pt called with his new mailing and residential address. Asked for a referral to [facility] for case management    Mailing:   [phone]  [place], [place] [address]    Residential:   [address]  [place], [place] [address]    ADHERENCE: Did not discuss at this interaction due to competing priorities and/or no concerns.    INTERVENTION:  SW filled in medicaid case management form for [facility] and put in provider folder for Orlene Erm to sign.     PLAN:  Dr. Servando Snare will sign referral form and SW or nursing will fax to [facility].          Bradly Bienenstock LCSW, CHES  [facility] Lead Social Worker

## 2021-03-07 NOTE — Unmapped (Signed)
[facility] Refill Coordination Note    Specialty Medication(s) to be Shipped:   Inflammatory Disorders: Dupixent    Other medication(s) to be shipped: No additional medications requested for fill at this time     Timothy Tapia, DOB: 1965/11/15  Phone: [phone] (home) [phone] (work)      All above HIPAA information was verified with patient.     Was a Nurse, learning disability used for this call? No    Completed refill call assessment today to schedule patient's medication shipment from the [facility] [phone]).  All relevant notes have been reviewed.     Specialty medication(s) and dose(s) confirmed: Regimen is correct and unchanged.   Changes to medications: Timothy Tapia reports no changes at this time.  Changes to insurance: No  New side effects reported not previously addressed with a pharmacist or physician: None reported  Questions for the pharmacist: No    Confirmed patient received a Conservation officer, historic buildings and a Surveyor, mining with first shipment. The patient will receive a drug information handout for each medication shipped and additional FDA Medication Guides as required.       DISEASE/MEDICATION-SPECIFIC INFORMATION        For patients on injectable medications: Patient currently has 0 doses left.  Next injection is scheduled for 03/21/21.    SPECIALTY MEDICATION ADHERENCE     Medication Adherence    Patient reported X missed doses in the last month: 0  Specialty Medication: dupilumab (DUPIXENT PEN) 300 mg/2 mL PnIj  Patient is on additional specialty medications: No              Were doses missed due to medication being on hold? No        REFERRAL TO PHARMACIST     Referral to the pharmacist: Not needed      [facility]     Shipping address confirmed in Epic.     Delivery Scheduled: Yes, Expected medication delivery date: 03/14/21.     Medication will be delivered via Same Day Courier to the prescription address in Epic WAM.    Timothy Tapia   [facility] Specialty Technician

## 2021-03-07 NOTE — Unmapped (Signed)
Referral Services Note     Duration of Intervention: 10 minutes    TYPE OF CONTACT: Phone    ASSESSMENT: Pt called SW to request mirtazapine (REMERON) medication to be placed at a different pharmacy. Pt reported he recently moved and will no longer be able to pick-up medication at the [facility].     INTERVENTION: SW provided active listening, validation, empathic feedback, unconditional positive regard, and congruence within the relationship. SW provided assessment of needs: [transfer medication/prescription to new pharmacy].    PLAN: SW made contact with pharmacist to request medication be transferred.        Luella Cook, LCSW-A, LCAS-A, NCTTP  Social Work Practitioner - [facility]

## 2021-03-10 DIAGNOSIS — B20 Human immunodeficiency virus [HIV] disease: Principal | ICD-10-CM

## 2021-03-10 MED ORDER — PREZCOBIX 800 MG-150 MG TABLET
ORAL_TABLET | 11 refills | 0 days
Start: 2021-03-10 — End: ?

## 2021-03-10 MED ORDER — TIVICAY 50 MG TABLET
ORAL_TABLET | 11 refills | 0 days
Start: 2021-03-10 — End: ?

## 2021-03-12 MED ORDER — MIRTAZAPINE 30 MG TABLET
ORAL_TABLET | Freq: Every day | ORAL | 6 refills | 30 days | Status: CP
Start: 2021-03-12 — End: 2022-04-11

## 2021-03-12 NOTE — Unmapped (Signed)
Request for mirtazapine refill to go to [facility].    Most recent visit 12/11/2020    ?? Per provider note: On Remeron 30 mg at bedtime     Recall in for 08/13/2021    Per protocol Remeron 30mg  at bedtime #30 w/ 6 refills approved with requested pharmacy

## 2021-03-12 NOTE — Unmapped (Signed)
Medical Case Management Social Work Note     Duration of Intervention: 5 minutes    TYPE OF CONTACT: Phone     ASSESSMENT:Pt needs a refill of his mirtazapine (REMERON) 30 MG tablet to [facility] [address], [place] - [address] [phone] [address], [place] [address]    ADHERENCE: Reports good adherence with no barriers.    INTERVENTION:  SW sent message to Remi Haggard nurse asking for him to put in refill request.     PLAN:  [facility] will call pt for shipment when they have the refill.          Bradly Bienenstock LCSW, CHES  [facility] Lead Social Worker

## 2021-03-14 MED FILL — DUPIXENT 300 MG/2 ML SUBCUTANEOUS PEN INJECTOR: SUBCUTANEOUS | 28 days supply | Qty: 4 | Fill #4

## 2021-03-14 NOTE — Unmapped (Signed)
Timothy Tapia 's Dupixent shipment will be delayed as a result of Courier returned package . Delivery address outside of courier range.    I have reached out to the patient  at [phone] and communicated the delivery change. We will reschedule the medication for the delivery date that the patient agreed upon.  We have confirmed the delivery date as 9/15, via ups.

## 2021-03-15 NOTE — Unmapped (Signed)
Medical Case Management Social Work Note     Duration of Intervention: 5 minutes    TYPE OF CONTACT: Phone    ASSESSMENT: Pt called asking if her remeron was refilled as he only has a couple days of this left.     ADHERENCE: Reports adherence concerns. Counseling provided.    INTERVENTION:  SW informed pt the refill was sent to [facility] on Monday and instructed pt to call and ask for overnight shipment.     PLAN:  Pt will call if [facility] states they still do not have the refill.          Bradly Bienenstock LCSW, CHES  [facility] Lead Social Worker

## 2021-03-20 ENCOUNTER — Ambulatory Visit: Admit: 2021-03-20 | Discharge: 2021-03-21 | Payer: MEDICARE

## 2021-03-20 MED FILL — HALOBETASOL PROPIONATE 0.05 % TOPICAL OINTMENT: TOPICAL | 25 days supply | Qty: 50 | Fill #4

## 2021-03-20 NOTE — Unmapped (Signed)
Referral Services Note     Duration of Intervention: 15 minutes    TYPE OF CONTACT: Face to Face - In Person    ASSESSMENT: Pt reported he wanted to start a new 12-step program in [place], [place]. Pt endorsed he is was unable to enroll into the [facility] program, unless he was approved to receive Substance Use services through his insurance. Pt shared the contact information of the [facility], in [place], [place] with the SW. Ph: [phone].    INTERVENTION:  SW met with pt to assess needs. SW provided active listening, validation, empathic feedback, unconditional positive regard, and congruence within the relationship. SW reported to the pt that she will follow-up with the program to gain a better understanding of the requirements needed for the pt to particpate in the program.    PLAN: SW will contact the [employer] program to inquire more information about the prerequisites for the [employer] program.      Luella Cook, LCSW-A, LCAS-A, NCTTP  Social Work Practitioner - [facility]

## 2021-03-21 NOTE — Unmapped (Signed)
Referral Services Note     Duration of Intervention: 10 minutes    TYPE OF CONTACT: Phone    ASSESSMENT: Pt inquired about updates regarding the SW contacting the [employer] program. Pt expressed continued desire for relapse prevention. Pt endorsed he would look for more programs on his own and will call SW back. Pt endorsed he was open to receiving virtual NA group support.    INTERVENTION:  SW called to inform pt about the [employer] program restrictions regarding his type of Medicaid ([employer]). SW contacted the program prior to speaking with the pt. SW offered to help pt look for additional resources to support pt from returning to use. SW provided the pt with a link to nearest NA meeting (virtual and in-person), utilizing the website: [email or web address].pl    PLAN: Pt reported he will look up more 12-steps programs and call the SW back at a later time.      Luella Cook, LCSW-A, LCAS-A, NCTTP  Social Work Practitioner - [facility]

## 2021-03-29 DIAGNOSIS — B20 Human immunodeficiency virus [HIV] disease: Principal | ICD-10-CM

## 2021-03-29 MED ORDER — TIVICAY 50 MG TABLET
ORAL_TABLET | Freq: Every day | ORAL | 6 refills | 30.00000 days | Status: CP
Start: 2021-03-29 — End: ?

## 2021-03-29 MED ORDER — PREZCOBIX 800 MG-150 MG TABLET
ORAL_TABLET | Freq: Every day | ORAL | 6 refills | 30.00000 days | Status: CP
Start: 2021-03-29 — End: ?

## 2021-03-29 NOTE — Unmapped (Signed)
Refill request for tivicay and prezcobix    Most recent visit 12/11/2020  Recall in for 08/13/2020    ?? Per last provider note: On Prezcobix + Dolutegravir   Per protocol Prezcobix 1 daily #30 w/ 6 refills  tivicay one tablet daily #30 w/ 6 refills authorized with [facility].

## 2021-03-29 NOTE — Unmapped (Signed)
Referral Services Note     Duration of Intervention: 8 minutes    TYPE OF CONTACT: Phone    ASSESSMENT: Pt called to inquire about medication/prescription getting filled and cost questions.      INTERVENTION: SW referred pt to the benefits team.    PLAN:  The benefits team reported someone will reach out to the patient to follow-up regarding inquiry.    Luella Cook, LCSW-A, LCAS-A, NCTTP  Social Work Practitioner - [facility]

## 2021-03-30 NOTE — Unmapped (Signed)
I called pt. to remind him of the appt. on 04/02/21 at 1330. I asked pt. to arrive at 1300. Pt. reminded to bring a driver and that it is OK to eat before the appt. Pt. advised that if there are s/s of illness or if on antibiotics for any reason, the appt. will need to be rescheduled. Denies illness and not taking antibiotics. Pt. took [facility] Powder this morning and last before that was 2-3 days ago. Dr. Sabino Gasser said he definitely has to R/S. I called pt. back to let him know, and he became upset. Said that there was no need to R/S because he needs this inj. badly and he won't take any more BCs. I told him this is a safety issue because BC could cause him to bleed. Transferred him to [facility] to get a new appt. Pt. counseled to no take NAY BC Powder within 1 week of inj. appt.

## 2021-04-02 MED ORDER — PREZCOBIX 800 MG-150 MG TABLET
ORAL_TABLET | 11 refills | 0.00000 days
Start: 2021-04-02 — End: ?

## 2021-04-02 MED ORDER — TIVICAY 50 MG TABLET
ORAL_TABLET | 11 refills | 0 days
Start: 2021-04-02 — End: ?

## 2021-04-11 NOTE — Unmapped (Signed)
Medical Case Management Social Work Note     Duration of Intervention: 5 minutes    TYPE OF CONTACT: Phone    ASSESSMENT: Pt called asking for help enrolling in a community housing program in his new area.     ADHERENCE: Did not discuss at this interaction due to competing priorities and/or no concerns.    INTERVENTION:  SW advised pt reach out to his local community case manager for hands on assistance as this SW is unable to help as intensively from [employer]. Pt stated he would. Advised pt to call back if his community case manager won't or can't do this.     PLAN:  Pt will call back if addt help is needed.          Bradly Bienenstock, LCSW, CHES  [facility] Lead Social Worker

## 2021-04-13 NOTE — Unmapped (Signed)
[facility] Clinical Assessment & Refill Coordination Note    Timothy Tapia, DOB: 01-Jan-1966  Phone: [phone] (home) [phone] (work)    All above HIPAA information was verified with patient.     Was a Nurse, learning disability used for this call? No    Specialty Medication(s):   Inflammatory Disorders: Dupixent     Current Outpatient Medications   Medication Sig Dispense Refill   ??? amLODIPine (NORVASC) 5 MG tablet TAKE ONE TABLET BY MOUTH DAILY 30 tablet 11   ??? betamethasone, augmented, (DIPROLENE) 0.05 % ointment Apply twice daily to hands and then cover with gloves (Patient not taking: No sig reported) 50 g 6   ??? cetirizine (ZYRTEC) 10 MG tablet TAKE ONE TABLET BY MOUTH EVERY DAY 30 tablet 2   ??? CREON 24,000-76,000 -120,000 unit CpDR delayed release capsule TAKE 1 CAPSULE BY MOUTH THREE TIMES DAILY WITH A MEAL 90 capsule 6   ??? darunavir-cobicistat (PREZCOBIX) 800-150 mg-mg tablet Take 1 tablet by mouth daily. 30 tablet 6   ??? dolutegravir (TIVICAY) 50 mg TABLET Take 1 tablet (50 mg total) by mouth daily. 30 tablet 6   ??? dupilumab (DUPIXENT PEN) 300 mg/2 mL PnIj Inject the contents of 1 pen (300 mg) under the skin every fourteen (14) days. 4 mL 5   ??? empty container Misc Use as directed to dispose of Dupixent pens. 1 each 2   ??? enalapril (VASOTEC) 20 MG tablet TAKE 1 TABLET BY MOUTH EVERY DAY 30 tablet 11   ??? gabapentin (NEURONTIN) 300 MG capsule TAKE ONE CAPSULE BY MOUTH 3 TIMES A DAY 270 capsule 2   ??? halobetasol (ULTRAVATE) 0.05 % ointment APPLY TO AFFECTED AREA TWICE A DAY AS NEEDED AVOID FACE AND FOLDS 30 g 1   ??? halobetasol (ULTRAVATE) 0.05 % ointment Apply topically Two (2) times a day. To active areas until smooth 100 g 4   ??? mirtazapine (REMERON) 30 MG tablet Take 1 tablet (30 mg total) by mouth daily. 30 tablet 6   ??? pantoprazole (PROTONIX) 20 MG tablet Take 1 tablet (20 mg total) by mouth daily. 90 tablet 3   ??? triamcinolone (KENALOG) 0.1 % ointment Apply to itchy spots 2 times daily. For elbows at night time, apply medicine and then cover with saran wrap. (Patient not taking: No sig reported) 454 g 0   ??? VENTOLIN HFA 90 mcg/actuation inhaler INHALE 2 PUFFS UP TO FOUR TIMES DAILY AS NEEDED       No current facility-administered medications for this visit.        Changes to medications: Jaequan reports no changes at this time.    Allergies   Allergen Reactions   ??? Sulfa (Sulfonamide Antibiotics) Hives   ??? Sulfur Hives       Changes to allergies: No    SPECIALTY MEDICATION ADHERENCE     Dupixent 300 mg/40ml pen: 0 days of medicine on hand       Medication Adherence    Patient reported X missed doses in the last month: 0, although typically takes dose a day or two later than his scheduled dose  Specialty Medication: Dupixent          Specialty medication(s) dose(s) confirmed: Regimen is correct and unchanged.     Are there any concerns with adherence? Yes: Patient reports that he is consistently forgetting his dose and takes it a day or two late.    Adherence counseling provided? Yes: reviewed missed dose instructions, discussed putting his  scheduled doses on a paper calendar that he looks at frequently or on the calendar/a reminder on his phone    CLINICAL MANAGEMENT AND INTERVENTION      Clinical Benefit Assessment:    Do you feel the medicine is effective or helping your condition? Yes    Clinical Benefit counseling provided? Not needed    Adverse Effects Assessment:    Are you experiencing any side effects? No    Are you experiencing difficulty administering your medicine? No    Quality of Life Assessment:    Quality of Life        Dermatology  1. What impact has your specialty medication had on the symptoms of your skin condition (i.e. itchiness, soreness, stinging)?: Some  2. What impact has your specialty medication had on your comfort level with your skin?: Some                 Have you discussed this with your provider? Patient will discuss at upcoming appointment. He reports that the halobetasol ointment prescribed makes him itch.    Acute Infection Status:    Acute infections noted within Epic:  No active infections  Patient reported infection: None    Therapy Appropriateness:    Is therapy appropriate and patient progressing towards therapeutic goals? Yes, therapy is appropriate and should be continued    DISEASE/MEDICATION-SPECIFIC INFORMATION      For patients on injectable medications: Patient currently has 0 doses left.  Next injection is scheduled for 10/19.    PATIENT SPECIFIC NEEDS     - Does the patient have any physical, cognitive, or cultural barriers? No    - Is the patient high risk? No    - Does the patient require a Care Management Plan? No     - Does the patient require physician intervention or other additional services (i.e. nutrition, smoking cessation, social work)? No      SHIPPING     Specialty Medication(s) to be Shipped:   Inflammatory Disorders: Dupixent    Other medication(s) to be shipped: No additional medications requested for fill at this time     Changes to insurance: No    Delivery Scheduled: Yes, Expected medication delivery date: 10/18.     Medication will be delivered via UPS to the confirmed prescription address in [facility].    The patient will receive a drug information handout for each medication shipped and additional FDA Medication Guides as required.  Verified that patient has previously received a Conservation officer, historic buildings and a Surveyor, mining.    The patient or caregiver noted above participated in the development of this care plan and knows that they can request review of or adjustments to the care plan at any time.      All of the patient's questions and concerns have been addressed.    [name]   [facility] Specialty Pharmacist

## 2021-04-16 DIAGNOSIS — M5416 Radiculopathy, lumbar region: Principal | ICD-10-CM

## 2021-04-16 MED FILL — DUPIXENT 300 MG/2 ML SUBCUTANEOUS PEN INJECTOR: SUBCUTANEOUS | 28 days supply | Qty: 4 | Fill #5

## 2021-04-20 NOTE — Unmapped (Signed)
I called pt. to remind him of the appt. on 04/23/21 at 1300. I asked pt. to arrive at 1230. Pt. reminded to bring a driver and that it is OK to eat before the appt. Pt. advised that if there are s/s of illness or if on antibiotics for any reason, the appt. will need to be rescheduled. Denies illness and not taking antibiotics. Pt. confirmed that he has not taken any BC Powder in the past week.

## 2021-04-23 ENCOUNTER — Ambulatory Visit: Admit: 2021-04-23 | Discharge: 2021-04-23 | Payer: MEDICARE

## 2021-04-23 ENCOUNTER — Ambulatory Visit
Admit: 2021-04-23 | Discharge: 2021-04-23 | Payer: MEDICARE | Attending: Physician Assistant | Primary: Physician Assistant

## 2021-04-23 DIAGNOSIS — L309 Dermatitis, unspecified: Principal | ICD-10-CM

## 2021-04-23 MED ORDER — BETAMETHASONE, AUGMENTED 0.05 % TOPICAL OINTMENT
5 refills | 0.00000 days | Status: CP
Start: 2021-04-23 — End: 2021-04-23

## 2021-04-23 MED ORDER — DUPIXENT 300 MG/2 ML SUBCUTANEOUS PEN INJECTOR
SUBCUTANEOUS | 5 refills | 28.00000 days | Status: CP
Start: 2021-04-23 — End: ?
  Filled 2021-05-09: qty 4, 28d supply, fill #0

## 2021-04-23 MED ADMIN — iohexoL (OMNIPAQUE) 180 mg iodine/mL solution 1 mL: 1 mL | @ 17:00:00 | Stop: 2021-04-23

## 2021-04-23 MED ADMIN — lidocaine (XYLOCAINE) 10 mg/mL (1 %) injection: @ 17:00:00 | Stop: 2021-04-23

## 2021-04-23 MED ADMIN — dexamethasone (DECADRON) injection: @ 17:00:00 | Stop: 2021-04-23

## 2021-04-23 NOTE — Unmapped (Signed)
Medical Case Management Social Work Note     Duration of Intervention: 5 minutes    TYPE OF CONTACT: Phone    ASSESSMENT: Pt called stating he'd like this SW to relay a message to [employer]. He states he had throat surgery a few years back and is starting to cough again. He states it feels like something is always dangling in his throat.     ADHERENCE: Did not discuss at this interaction due to competing priorities and/or no concerns.    INTERVENTION:  SW sent inbasket message to Dr. Servando Snare to see what he'd like to do per pt.     PLAN:  Will assist with referrals if needed.     Timothy Bienenstock, LCSW, CHES  [facility] Lead Social Worker

## 2021-04-23 NOTE — Unmapped (Signed)
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 Biopsy 03/2020 with Epidermal acanthosis with focal spongiosis and prominent papillary dermal fibrosis.  - previously on halobetasol 0.05% ointment and triamcinolone 0.1% ointment but these topicals have caused worsened pruritus per patient report.   - started Dupixent 05/2020 and has responded well   - continue Dupixent 300 mg every 14 days, refill sent today.   - restart betamethasone 0.05% ointment BID to active areas until smooth.   - Reviewed side effects of topical corticosteroids including atrophy, striae and dyschromia; however, discussed the current light areas are PIH and secondary to eczema and not the medications  - Continue using Vaseline daily     The patient was advised to call for an appointment should any new, changing, or symptomatic lesions develop.     RTC: Return in about 6 months (around 10/22/2021) for Follow up of eczema. or sooner as needed   _________________________________________________________________      Chief Complaint     Follow up of eczema    HPI     Timothy Tapia is a 55 y.o. male who presents as a returning patient (last seen by Sharene Butters, PA on 10/31/2020) to [facility] for follow up of eczema. At last visit, patient was to continue dupixent 300 mg every 14 days and to start halobetasol 0.05% ointment BID for the same.      Today the patient reports the following:    Rash  - he attempted to treat hands with betamethasone with improvement, but ran out   - was using halobetasol on the body however this seems to have caused increased pruritus  - rash is currently active on his arms, hands, legs,and ears   - has been doing dupixent injections every 14 days with improvement. States he is tolerating this medication well, denies injection site reaction    The patient denies any other new or changing lesions or areas of concern.     Pertinent Past Medical History     No history of skin cancer    Family History:   Negative for melanoma    Past Medical History, Family History, Social History, Medication List, Allergies, and Problem List were reviewed in the rooming section of Epic.     ROS: Other than symptoms mentioned in the HPI, no fevers, chills, or other skin complaints    Physical Examination     GENERAL: Well-appearing male in no acute distress, resting comfortably.  NEURO: Alert and oriented, answers questions appropriately  PSYCH: Normal mood and affect  SKIN (Focal Skin Exam): Per patient request, examination of bilateral forearms, hands, ears, lower extremities (excluding feet and ankles) was performed  - eczematous hyperpigmented papules and plaques of the hands, arms, legs and ears.     All areas not commented on are within normal limits or unremarkable    Scribe's Attestation: Sharene Butters, PA obtained and performed the history, physical exam and medical decision making elements that were entered into the chart.  Signed by Hazle Nordmann, Scribe, on April 23, 2021 at 4:00 PM.    ----------------------------------------------------------------------------------------------------------------------  April 23, 2021 9:42 PM. Documentation assistance provided by the Scribe. I was present during the time the encounter was recorded. The information recorded by the Scribe was done at my direction and has been reviewed and  validated by me.  ------------------------------------------------------------------------------------------------------------------    (Approved Template 03/13/2020)

## 2021-04-23 NOTE — Unmapped (Signed)
Diagnosis: right lumbar radiculitis    Had 4 months of 60% relief from last TFESI per patient    Procedure:  right L5\S1 Transforaminal Epidural Steroid Injection under Fluoroscopy    Medications used:   - 1 mL dexamethasone (10mg /mL)  - 2 mL 1% preservative free lidocaine without epinephrine for test dose  - 3 mL 1% preservative free lidocaine without epinephrine for soft tissue anesthesia  - 1 mL Omnipaque    Procedure In Detail:   After risks and benefits of this procedure were explained to the patient, written and verbal consent were obtained and a time-out was performed.  The patient was placed in a prone position on the exam table.  The overlying skin was cleaned with chloroprep and draped using a sterile technique.  A time-out was called to confirm the patient s identity, side of the injection site, and type of procedure.    Under fluoroscopic guidance, a 22-gauge 3.5-inch spinal needle was advanced toward the right L5\S1 neuroforamen.  Final needle depth was confirmed with a lateral view. Omnipaque was injected from an AP view and demonstrated an epidural flow pattern. After negative aspiration, 2mL of 1% lidocaine was injection. After 2 minutes, patient was able to move toes. 1.0 mL of dexamethasone was then injected into  the right L5\S1 transforaminal epidural space.The needle was then withdrawn, all bleeding controlled and a bandaid was placed over the injection site.  The patient tolerated the procedure well and there were no immediate complications to the procedure.    Pre-procedure VAS - 8  Post-procedure VAS - 1    Follow-up: The patient will [facility] up in [facility] in 2 wks and was advised to call our office if any erythema or discharge develops from the injection sites.    I was present throughout the entire procedure.    Carnella Guadalajara, D.O.  [facility]  [address]  [place], [place] [address]  [phone]  [phone] (fax)

## 2021-04-23 NOTE — Unmapped (Addendum)
Mr. Lincoln, it was good to see you! Today, we recommend the following:    - Continue Dupixent injections every 14 days.  - Apply betamethasone twice a day to active areas for up to 2 weeks.     Have a great day!  Sharene Butters, PA

## 2021-04-24 DIAGNOSIS — L309 Dermatitis, unspecified: Principal | ICD-10-CM

## 2021-04-24 MED ORDER — BETAMETHASONE, AUGMENTED 0.05 % TOPICAL OINTMENT
5 refills | 0.00000 days | Status: CP
Start: 2021-04-24 — End: ?

## 2021-04-24 NOTE — Unmapped (Signed)
Thank you :)

## 2021-04-24 NOTE — Unmapped (Signed)
Hi,    Patient requesting for the initial refill to be sent to [facility] and the remainding to stay at [facility]. Can this be done?    [facility] [phone]  Address: [address] at [facility] [address] in [place], [place]  Phone: [phone]  Fax: [phone]    Thanks!

## 2021-05-03 NOTE — Unmapped (Signed)
[facility] Refill Coordination Note    Specialty Medication(s) to be Shipped:   Inflammatory Disorders: Dupixent    Other medication(s) to be shipped: No additional medications requested for fill at this time     Timothy Tapia, DOB: 12/12/1965  Phone: [phone] (home) [phone] (work)      All above HIPAA information was verified with patient.     Was a Nurse, learning disability used for this call? No    Completed refill call assessment today to schedule patient's medication shipment from the [facility] [phone]).  All relevant notes have been reviewed.     Specialty medication(s) and dose(s) confirmed: Regimen is correct and unchanged.   Changes to medications: [name] reports starting the following medications: Betamethasone  Changes to insurance: No  New side effects reported not previously addressed with a pharmacist or physician: None reported  Questions for the pharmacist: No    Confirmed patient received a Conservation officer, historic buildings and a Surveyor, mining with first shipment. The patient will receive a drug information handout for each medication shipped and additional FDA Medication Guides as required.       DISEASE/MEDICATION-SPECIFIC INFORMATION        For patients on injectable medications: Patient currently has 0 doses left.  Next injection is scheduled for 11/16.    SPECIALTY MEDICATION ADHERENCE     Medication Adherence    Patient reported X missed doses in the last month: 0  Specialty Medication: dupixent 300mg /19ml  Patient is on additional specialty medications: No  Patient is on more than two specialty medications: No  Any gaps in refill history greater than 2 weeks in the last 3 months: no  Demonstrates understanding of importance of adherence: yes  Informant: patient  Reliability of informant: reliable  Provider-estimated medication adherence level: good  Patient is at risk for Non-Adherence: No  Reasons for non-adherence: no problems identified  Confirmed plan for next specialty medication refill: delivery by pharmacy  Refills needed for supportive medications: not needed          Refill Coordination    Has the Patients' Contact Information Changed: No  Is the Shipping Address Different: No         Were doses missed due to medication being on hold? No    dupixent 300/2 mg/ml: 0 days of medicine on hand         REFERRAL TO PHARMACIST     Referral to the pharmacist: Not needed      [facility]     Shipping address confirmed in Epic.     Delivery Scheduled: Yes, Expected medication delivery date: 11/10.     Medication will be delivered via UPS to the prescription address in Epic [facility].    Antonietta Barcelona   [facility] Specialty Technician

## 2021-05-07 NOTE — Unmapped (Signed)
Medical Case Management Social Work Note     Duration of Intervention: 15 minutes    TYPE OF CONTACT: Phone    ASSESSMENT: Pt called asking if it would cost him anything to get the [facility] off his Medicaid so that he can join the [facility] so that he can get into a halfway house called [facility] in [place].     ADHERENCE: Did not discuss at this interaction due to competing priorities and/or no concerns.    INTERVENTION:  SW informed pt that this would not cost him anything. Ensured pt has the [facility] phone number and he stated he did. Informed pt that as long as he switched his address with Medicaid, there shouldn't be a problem switching his LME. Pt stated he did change his address with medicaid.     PLAN:  Pt will call the [facility] to change his LME to [facility].       Bradly Bienenstock, LCSW, CHES  [facility] ID Clinic Lead Social Worker

## 2021-05-09 DIAGNOSIS — L309 Dermatitis, unspecified: Principal | ICD-10-CM

## 2021-05-09 NOTE — Unmapped (Signed)
Called the pt regarding the hives he mentioned on my chart message, pt states he is ok just get some hives, let the pt know to go to the emergency if he has any  breathing problem or hives getting worse, pt is aware.

## 2021-05-10 ENCOUNTER — Ambulatory Visit: Admit: 2021-05-10 | Discharge: 2021-05-11 | Payer: MEDICARE

## 2021-05-10 DIAGNOSIS — R21 Rash and other nonspecific skin eruption: Principal | ICD-10-CM

## 2021-05-10 DIAGNOSIS — L309 Dermatitis, unspecified: Principal | ICD-10-CM

## 2021-05-10 DIAGNOSIS — B20 Human immunodeficiency virus [HIV] disease: Principal | ICD-10-CM

## 2021-05-10 MED ORDER — IVERMECTIN 3 MG TABLET
ORAL_TABLET | ORAL | 0 refills | 0.00000 days | Status: CP
Start: 2021-05-10 — End: ?

## 2021-05-10 MED ORDER — CETIRIZINE 10 MG TABLET
ORAL_TABLET | 2 refills | 0 days | Status: CP
Start: 2021-05-10 — End: ?

## 2021-05-10 MED ORDER — PERMETHRIN 5 % TOPICAL CREAM
Freq: Once | TOPICAL | 0 refills | 1.00000 days | Status: CP
Start: 2021-05-10 — End: 2021-05-10

## 2021-05-10 MED ORDER — HYDROXYZINE HCL 25 MG TABLET
ORAL_TABLET | Freq: Every evening | ORAL | 0 refills | 30.00000 days | Status: CP
Start: 2021-05-10 — End: ?

## 2021-05-10 MED ORDER — DUPILUMAB 300 MG/2 ML SUBCUTANEOUS PEN INJECTOR
SUBCUTANEOUS | 6 refills | 0.00000 days | Status: CP
Start: 2021-05-10 — End: 2021-06-09
  Filled 2021-05-30: qty 8, 28d supply, fill #0

## 2021-05-10 NOTE — Unmapped (Signed)
Timothy Tapia,    The providers may be in clinic and will respond ASAP.      GoodRx.com is a site that you may visit to view coupons & savings on medications that are not covered by your insurance company.

## 2021-05-10 NOTE — Unmapped (Signed)
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 Biopsy 03/2020 with Epidermal acanthosis with focal spongiosis and prominent papillary dermal fibrosis.  - Previously on halobetasol 0.05% ointment and triamcinolone 0.1% ointment but these topicals have caused worsened pruritus per patient report; now on betamethasone ointment  - Started Dupixent 05/2020 and has responded well; we could consider trying to increase Dupixent weekly; discussed opt of JAK inhibitor -- some literature suggests JAK inhibitor use in HIV patients to reduce inflammation; pt has hx of DVT so not a candidate for JAK inhibitor though  - Continue betamethasone 0.05% ointment BID to active areas until smooth.   - Will treat for scabies infection to make sure all bases are covered; previosuly treated in the past and had some symptom relief  - Reviewed side effects of topical corticosteroids including atrophy, striae and dyschromia; however, discussed the current light areas are PIH and secondary to eczema and not the medications  - Continue using Vaseline daily   - ivermectin (STROMECTOL) 3 mg Tab; Take 5 tablets by mouth once today and then take another 5 tablets by mouth 1 week later  Dispense: 10 tablet; Refill: 0 to ensure no additional component of scabies given flare on dupixent  - dupilumab 300 mg/2 mL PnIj; Inject 300 mg under the skin once a week.  Dispense: 4 mL; Refill: 6    The patient was advised to call for an appointment should any new, changing, or symptomatic lesions develop.     RTC: Return in about 6 weeks (around 06/21/2021) for Recheck- Turner. or sooner as needed   _________________________________________________________________    Chief Complaint     Chief Complaint   Patient presents with   ??? Rash     Pt is currently broke out on hands, back and legs, says it happened after using a sulfa shampoo Pt is allergic to sulfa     HPI     Timothy Tapia is a 55 y.o. male who presents as a returning patient (last seen 04/23/2021) to [facility] for acute on chronic itching and rash. He states that he used Head and Shoulders shampoo a few days ago and got acute itching and he endorses having hives. He was fearful that the sulfate in the H&S shampoo would cross react with his sulfur allergy and cause a skin reaction. He is currently using Dupixent q2weeks and uses betamethasone daily. He feels like the betamethasone is not working as well as it used to for his rash.     Pt is on antiretrovirals for HIV.      The patient denies any other new or changing lesions or areas of concern.     Pertinent Past Medical History     No history of skin cancer    Problem List        Other    Human immunodeficiency virus (HIV) disease (CMS-HCC) - Primary     HIV (+)             Family History:   Unknown     Past Medical History, Family History, Social History, Medication List, Allergies, and Problem List were reviewed in the rooming section of Epic.     ROS: Other than symptoms mentioned in the HPI, no fevers, chills, or other skin complaints    Physical Examination  GENERAL: Well-appearing male in no acute distress, resting comfortably.  NEURO: Alert and oriented, answers questions appropriately  SKIN (Full Skin Exam): Examination of the face, eyelids, lips, nose, ears, neck, chest, abdomen, back, arms, legs, hands, feet, palms, soles, nails was performed  - Lichenified papules coalescing into plaques on bilateral dorsal hands, forearms, thighs, BLE, dorsal feet, with sparing of the popliteal fossa and trunk    All areas not commented on are within normal limits or unremarkable      (Approved Template 03/13/2020)

## 2021-05-10 NOTE — Unmapped (Signed)
Today your resident physician was: Dr. Linville Decarolis

## 2021-05-10 NOTE — Unmapped (Signed)
Spoke with pt about his medications. Pt stated he picked up the Hydroxyzine and wanted [facility]. Pt stated that he is waiting on information about the program with rx assistance.

## 2021-05-11 DIAGNOSIS — R21 Rash and other nonspecific skin eruption: Principal | ICD-10-CM

## 2021-05-11 DIAGNOSIS — L309 Dermatitis, unspecified: Principal | ICD-10-CM

## 2021-05-11 NOTE — Unmapped (Signed)
Contacted patient via mychart request. Provided the patient with information to for  GoodRx.com to view coupons & savings on medications that are not covered by your insurance company.

## 2021-05-13 DIAGNOSIS — R21 Rash and other nonspecific skin eruption: Principal | ICD-10-CM

## 2021-05-13 MED ORDER — PERMETHRIN 5 % TOPICAL CREAM
TOPICAL | 0 refills | 0.00000 days | Status: CP
Start: 2021-05-13 — End: ?

## 2021-05-14 MED ORDER — GABAPENTIN 300 MG CAPSULE
ORAL_CAPSULE | 2 refills | 0 days
Start: 2021-05-14 — End: ?

## 2021-05-14 NOTE — Unmapped (Signed)
Clinical Assessment Needed For: Dose Change  Medication: Dupixent pen 300mg /5ml  Last Fill Date/Day Supply: 11/9 / 28 days  Refill Too Soon until 11/20  Was previous dose already scheduled to fill: No    Notes to Pharmacist: weekly dosing PA approved- will retest 11/20

## 2021-05-15 MED ORDER — GABAPENTIN 300 MG CAPSULE
ORAL_CAPSULE | 2 refills | 0 days
Start: 2021-05-15 — End: ?

## 2021-05-15 NOTE — Unmapped (Signed)
Medical Case Management Social Work Note     Duration of Intervention: 15 minutes    TYPE OF CONTACT: Phone    ASSESSMENT: Pt left voicemail about switching from [facility] to [facility] to get into transitional living program [facility] yesterday.     He stated on call today that he got this figured out and it will change on 12/1. He states that he needs a refill on his gabapentin. He also wants an Xray of his throat because it feels weird when he swallows.     ADHERENCE: Did not discuss at this interaction due to competing priorities and/or no concerns.    INTERVENTION:  SW sent message to BJ Turner and Francisca December, RN about gabapentin rx and xray request as Dr. Annabell Sabal nurse Agnes Lawrence Jeanella Craze is out this week.     PLAN:  Nursing will submit refill request.     Timothy Bienenstock, LCSW, CHES  [facility] ID Clinic Lead Social Worker

## 2021-05-18 MED ORDER — GABAPENTIN 300 MG CAPSULE
ORAL_CAPSULE | 2 refills | 0 days
Start: 2021-05-18 — End: ?

## 2021-05-18 NOTE — Unmapped (Signed)
Timothy Tapia called requesting Gabapentin refill.     Last office visit 12/11/20    Next visit  08/13/21 in recall    Per provider's note:    ??? Gabapentin 900 mg at bedtime is helping he says. He notices that his pain is worse and mobility tougher when he is off of this med.     Medication not on Standing Orders Protocol    Will route to provider.

## 2021-05-21 MED ORDER — GABAPENTIN 300 MG CAPSULE
ORAL_CAPSULE | ORAL | 6 refills | 0.00000 days | Status: CP
Start: 2021-05-21 — End: ?

## 2021-05-21 NOTE — Unmapped (Signed)
Therapy Update Follow Up: No issues - Copay = $0

## 2021-05-23 NOTE — Unmapped (Signed)
I reviewed with [name] that his weekly Dupixent dosing had been approved. He's currently out of town for the holiday, but will plan to start 11/30.     [facility] Refill Coordination Note    Specialty Medication(s) to be Shipped:   Inflammatory Disorders: Dupixent    Other medication(s) to be shipped: No additional medications requested for fill at this time     Timothy Tapia, DOB: [birth year]  Phone: [phone] (home) [phone] (work)      All above HIPAA information was verified with patient.     Was a Nurse, learning disability used for this call? No    Completed refill call assessment today to schedule patient's medication shipment from the [facility] [phone]).  All relevant notes have been reviewed.     Specialty medication(s) and dose(s) confirmed: Dose increase to weekly with this fill   Changes to medications: Fransico reports no changes at this time.  Changes to insurance: No  New side effects reported not previously addressed with a pharmacist or physician: None reported  Questions for the pharmacist: No    Confirmed patient received a Conservation officer, historic buildings and a Surveyor, mining with first shipment. The patient will receive a drug information handout for each medication shipped and additional FDA Medication Guides as required.       DISEASE/MEDICATION-SPECIFIC INFORMATION        For patients on injectable medications: Patient currently has 1 doses left.  Next injection is scheduled for 11/30.    SPECIALTY MEDICATION ADHERENCE     Medication Adherence    Patient reported X missed doses in the last month: 0  Specialty Medication: Dupixent  Patient is on additional specialty medications: No              Were doses missed due to medication being on hold? No    Dupixent - 1 left    REFERRAL TO PHARMACIST     Referral to the pharmacist: No - specialty medication dose was changed, however patient has been counseled and pharmacist has reviewed. - rph performed call      SHIPPING Shipping address confirmed in Epic.     Delivery Scheduled: Yes, Expected medication delivery date: Thurs, 12/1.     Medication will be delivered via UPS to the prescription address in Epic WAM.    Narely Nobles A Desiree Lucy [facility] Specialty Pharmacist

## 2021-05-29 MED ORDER — AMLODIPINE 5 MG TABLET
ORAL_TABLET | Freq: Every day | ORAL | 3 refills | 30 days | Status: CP
Start: 2021-05-29 — End: ?
  Filled 2021-08-17: qty 30, 30d supply, fill #0

## 2021-05-29 NOTE — Unmapped (Signed)
Refill request received for patient. pharmacy  Medication Requested:   Amlodipine besylate  Last Office Visit:   12/11/20  Next Office Visit:   8 mos  Per Provider note:  ??? On ACEi plus Ca++ bloocker.     Will refill medication as per standing order protocol

## 2021-05-31 MED ORDER — AMLODIPINE 5 MG TABLET
ORAL_TABLET | 11 refills | 0 days
Start: 2021-05-31 — End: ?

## 2021-06-02 ENCOUNTER — Ambulatory Visit: Admit: 2021-06-02 | Discharge: 2021-06-02 | Disposition: A | Discharge status: Home / Self Care | Payer: MEDICARE

## 2021-06-02 DIAGNOSIS — M542 Cervicalgia: Principal | ICD-10-CM

## 2021-06-02 MED ORDER — LIDOCAINE 5 % TOPICAL PATCH
MEDICATED_PATCH | TRANSDERMAL | 0 refills | 7 days | Status: CP
Start: 2021-06-02 — End: 2021-06-09

## 2021-06-02 MED ORDER — METHYLPREDNISOLONE 4 MG TABLETS IN A DOSE PACK
0 refills | 0 days | Status: CP
Start: 2021-06-02 — End: ?

## 2021-06-02 MED ADMIN — orphenadrine (NORFLEX) injection 60 mg: 60 mg | INTRAMUSCULAR | @ 15:00:00 | Stop: 2021-06-02

## 2021-06-02 MED ADMIN — ketorolac (TORADOL) injection 30 mg: 30 mg | INTRAMUSCULAR | @ 15:00:00 | Stop: 2021-06-02

## 2021-06-02 NOTE — Unmapped (Cosign Needed)
Emergency Department   ED Provider Note  Room: 03-P/03-P    Medical Decision Making     55 y.o. male presented to the ED with c/o left neck pain radiating into arm. DDX that includes cervical radiculopathy, spasm, no traumatic hx to suggest possibility of fx. Exam otherwise reassuring.  Plan is to discharge home. Patient given strict return precautions for any new/worsening symptoms. The patient understood and agreed with the plan.              Progress Notes     12:34 PM Patient interviewed and I performed a physical examination.         Disposition     Clinical Impression:   Final diagnoses:   Neck pain (Primary)       Final Disposition: Home    History     Chief Complaint:  Arm Pain (/)       History provided by patient    HPI:  Timothy Tapia is a 55 y.o. male who presents to the ED for c/o left neck pain. Patient states pain radiates into his left arm. Denies any injuries. Similar symptoms in the past when he was told he had arthritis. Denies any weakness. Pain is worse with movement and certain movements of his neck. Describes pain as electric shock like when it radiates down his arm.     MEDICATIONS:   Discharge Medication List as of 06/02/2021 12:34 PM      START taking these medications    Details   lidocaine (LIDODERM) 5 % patch Place 1 patch on the skin daily for 7 days. Apply to affected area for 12 hours only each day (then remove patch), Starting Sat 06/02/2021, Until Sat 06/09/2021, Normal      methylPREDNISolone (MEDROL DOSEPACK) 4 mg tablet follow package directions, Normal         CONTINUE these medications which have NOT CHANGED    Details   amLODIPine (NORVASC) 5 MG tablet Take 1 tablet (5 mg total) by mouth daily., Starting Tue 05/29/2021, Normal      betamethasone, augmented, (DIPROLENE) 0.05 % ointment Apply twice daily to active areas until smooth, Normal      cetirizine (ZYRTEC) 10 MG tablet TAKE ONE TABLET BY MOUTH EVERY DAY, Normal      CREON 24,000-76,000 -120,000 unit CpDR delayed release capsule TAKE 1 CAPSULE BY MOUTH THREE TIMES DAILY WITH A MEAL, Normal      darunavir-cobicistat (PREZCOBIX) 800-150 mg-mg tablet Take 1 tablet by mouth daily., Starting Thu 03/29/2021, Normal      dolutegravir (TIVICAY) 50 mg TABLET Take 1 tablet (50 mg total) by mouth daily., Starting Thu 03/29/2021, Normal      !! dupilumab (DUPIXENT PEN) 300 mg/2 mL PnIj Inject the contents of 1 pen (300 mg) under the skin every fourteen (14) days., Starting Mon 04/23/2021, Normal      !! dupilumab 300 mg/2 mL PnIj Inject the contents of 1 pen (300 mg) under the skin once a week., Starting Thu 05/10/2021, Until Wed 06/27/2021, Normal      empty container Misc Use as directed to dispose of Dupixent pens., Starting Fri 05/12/2020, Normal      enalapril (VASOTEC) 20 MG tablet TAKE 1 TABLET BY MOUTH EVERY DAY, Normal      gabapentin (NEURONTIN) 300 MG capsule TAKE ONE CAPSULE BY MOUTH 3 TIMES A DAY, Normal      !! halobetasol (ULTRAVATE) 0.05 % ointment APPLY TO AFFECTED AREA TWICE A DAY AS NEEDED  AVOID FACE AND FOLDS, Normal      !! halobetasol (ULTRAVATE) 0.05 % ointment Apply topically Two (2) times a day. To active areas until smooth, Starting Tue 10/31/2020, Until Wed 10/31/2021, Normal      hydrOXYzine (ATARAX) 25 MG tablet Take 1 tablet (25 mg total) by mouth at bedtime. Prn pruritus, Starting Thu 05/10/2021, Normal      ivermectin (STROMECTOL) 3 mg Tab Take 5 tablets by mouth once today and then take another 5 tablets by mouth 1 week later, Normal      mirtazapine (REMERON) 30 MG tablet Take 1 tablet (30 mg total) by mouth daily., Starting Mon 03/12/2021, Until Thu 04/11/2022, Normal      pantoprazole (PROTONIX) 20 MG tablet Take 1 tablet (20 mg total) by mouth daily., Starting Mon 02/19/2021, Until Tue 02/19/2022, Normal      permethrin (ELIMITE) 5 % cream Apply to entire body for 8 to 14 hours then rinse off, then repeat 1 week later, Normal      triamcinolone (KENALOG) 0.1 % ointment Apply to itchy spots 2 times daily. For elbows at night time, apply medicine and then cover with saran wrap., Normal      VENTOLIN HFA 90 mcg/actuation inhaler INHALE 2 PUFFS UP TO FOUR TIMES DAILY AS NEEDED, Historical Med       !! - Potential duplicate medications found. Please discuss with provider.          ALLERGIES:   Allergies   Allergen Reactions   ??? Sulfa (Sulfonamide Antibiotics) Hives   ??? Sulfur Hives       PAST MEDICAL HISTORY:    Past Medical History:   Diagnosis Date   ??? Acute pancreatitis    ??? Barrett esophagus    ??? Black stool 07/08/2017   ??? Bronchitis    ??? Colon polyp    ??? Eczema    ??? Gastroesophageal reflux disease    ??? HIV disease (CMS-HCC)    ??? Hypertension    ??? Pancreatic pseudocyst    ??? 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 esophogus surgery     ??? PR COLONOSCOPY W/BIOPSY SINGLE/MULTIPLE  10/11/2013    Procedure: COLONOSCOPY, FLEXIBLE, PROXIMAL TO SPLENIC FLEXURE; WITH BIOPSY, SINGLE OR MULTIPLE;  Surgeon: Teodoro Spray, MD;  [facility];  Service: Gastroenterology   ??? PR COLONOSCOPY W/BIOPSY SINGLE/MULTIPLE N/A 09/29/2014    Procedure: COLONOSCOPY, FLEXIBLE, PROXIMAL TO SPLENIC FLEXURE; WITH BIOPSY, SINGLE OR MULTIPLE;  Surgeon: Bronson Curb, MD;  Location: [facility];  Service: Gastroenterology   ??? PR COLONOSCOPY W/BIOPSY SINGLE/MULTIPLE N/A 09/19/2015    Procedure: COLONOSCOPY, FLEXIBLE, PROXIMAL TO SPLENIC FLEXURE; WITH BIOPSY, SINGLE OR MULTIPLE;  Surgeon: Mayford Knife, MD;  Location: [facility];  Service: Gastroenterology   ??? PR COLSC FLX W/RMVL OF TUMOR POLYP LESION SNARE TQ N/A 09/19/2015    Procedure: COLONOSCOPY FLEX; W/REMOV TUMOR/LES BY SNARE;  Surgeon: Mayford Knife, MD;  Location: [facility];  Service: Gastroenterology   ??? PR ENDOSCOPIC US EXAM, ESOPH N/A 08/10/2015    Procedure: UGI ENDOSCOPY; WITH ENDOSCOPIC ULTRASOUND EXAMINATION LIMITED TO THE ESOPHAGUS;  Surgeon: Vonda Antigua, MD;  [facility]: [facility];  Service: Gastroenterology   ??? PR UPGI ENDOSCOPY,FN NEEDLE BX,GUIDED N/A 09/22/2014    Procedure: UGI W/TRANSENDOSCOPIC US-GUIDE  INTRA/TRANSMURAL NEEDLE ASP/BX (INCL EXAM ESOPHAGUS, STOMACH, DOUDENUM/JEJ);  Surgeon: Ned Grace, MD;  Location: [facility];  Service: Gastroenterology   ??? PR UPPER GI ENDOSCOPY,BIOPSY N/A 09/19/2015    Procedure: UGI ENDOSCOPY; WITH BIOPSY, SINGLE OR MULTIPLE;  Surgeon: Mayford Knife, MD;  [facility];  Service: Gastroenterology   ??? PR UPPER GI ENDOSCOPY,BIOPSY N/A 07/14/2017    Procedure: UGI ENDOSCOPY; WITH BIOPSY, SINGLE OR MULTIPLE;  Surgeon: Alfred Levins, MD;  Location: [facility];  Service: Gastroenterology       SOCIAL HISTORY:   Social History     Tobacco Use   ??? Smoking status: Every Day     Packs/day: 0.25     Years: 27.00     Pack years: 6.75     Types: Cigarettes     Start date: 06/28/1993   ??? Smokeless tobacco: Never   ??? Tobacco comments:     1 pack lasts 4 days; referred to [facility]; Taking nicorette gum   Substance Use Topics   ??? Alcohol use: Not Currently     Alcohol/week: 3.0 standard drinks     Types: 3 Cans of beer per week     Comment: Na       FAMILY HISTORY:    Family History   Problem Relation Age of Onset   ??? Hypertension Mother    ??? Diabetes Mother    ??? COPD Father    ??? Pancreatic cancer Neg Hx    ??? Pancreatitis Neg Hx    ??? Melanoma Neg Hx    ??? Basal cell carcinoma Neg Hx    ??? Squamous cell carcinoma Neg Hx        Past medical, surgical, and family histories reviewed by me and considered in my medical decision making.     Review of Systems   Constitutional: Negative for chills and fever.   Respiratory: Negative for shortness of breath.    Cardiovascular: Negative for chest pain.   Gastrointestinal: Negative for abdominal pain, blood in stool, diarrhea, nausea and vomiting. Musculoskeletal: Positive for neck pain.   All other systems reviewed and are negative.       Physical Exam  Vitals and nursing note reviewed.   Constitutional:       General: He is not in acute distress.     Appearance: Normal appearance.   HENT:      Head: Normocephalic.      Nose: Nose normal. No congestion.   Eyes:      General: No scleral icterus.        Right eye: No discharge.         Left eye: No discharge.      Extraocular Movements: Extraocular movements intact.      Conjunctiva/sclera: Conjunctivae normal.   Cardiovascular:      Rate and Rhythm: Normal rate.   Pulmonary:      Effort: Pulmonary effort is normal. No respiratory distress.   Musculoskeletal:         General: Normal range of motion.      Cervical back: Normal range of motion.      Comments: Positive Spurling's sign    Skin:     Coloration: Skin is not jaundiced.   Neurological:      Mental Status: He is alert.   Psychiatric:         Attention and Perception: Attention normal.         Mood  and Affect: Affect normal.          Vital Signs     Vitals:    06/02/21 0946   BP: 144/85   Pulse: 82   Resp: 16   Temp: 37.2 ??C (98.9 ??F)   TempSrc: Oral   SpO2: 99%   Weight: 72.8 kg (160 lb 8 oz)   Height: 164.5 cm (5' 4.76)       My interpretation of vitals: hypertensive, not tachycardic, RR WNL, afebrile, SPO2 99% on room air    Results     Pertinent labs & imaging results that were available during my care of the patient were reviewed by me and considered in my medical decision making.   Labs Reviewed - No data to display  No orders to display     No results found for this visit on 06/02/21 (from the past 4464 hour(s)).         Governor Specking [place], [place]  06/02/21 1242

## 2021-06-02 NOTE — Unmapped (Signed)
Provider in Triage (PIT) Note - Medical Screening Examination     Subjective:  Timothy Tapia is a 55 y.o. male presenting to the ED with a complaint of Arm Pain (/)     Patient presents for evaluation of pain that radiates from his left elbow up to his left shoulder and into his neck worsening over the past 2 weeks. Patient reports a history of the same several years ago secondary to arthritis. He has been taking Tylenol arthritis with no relief. Denies arm swelling. Denies known injury. Denies chest pain.      Objective:  BP 144/85  - Pulse 82  - Temp 37.2 ??C (98.9 ??F) (Oral)  - Resp 16  - Ht 164.5 cm (5' 4.76)  - Wt 72.8 kg (160 lb 8 oz)  - SpO2 99%  - BMI 26.90 kg/m??      Focused Physical Exam:  Constitutional: No acute distress  Respiratory: Non-labored respirations  Neurological: Moves all extremities equally, clear speech  ?  Assessment/Plan:  Toradol, Norflex, Room when available for proper exam   ?  Initial encounter with patient in triage completed, triage protocols implemented at provider???s discretion. A full ED report and evaluation with disposition will be completed by a healthcare provider when an ED bed or ED location is available. Patient is aware that this is an initial encounter and verbalizes understanding of plan.      Marcell Anger  June 02, 2021 9:54 AM      Scribe Statement:   Documentation assistance was provided by me personally, Marcell Anger, a scribe. Leilynn Pilat, PA-C obtained and performed the history, physical exam and medical decision making elements that were entered into the chart. Signed by Marcell Anger, 06/02/21 at 9:59 AM     Provider Attestation:   Documentation assistance was provided by Marcell Anger, a scribe.  I was present during the time the encounter was recorded. The information recorded by the Scribe was done at my direction and has been reviewed and validated by me.  ___

## 2021-06-02 NOTE — Unmapped (Addendum)
Left upper arm and shoulder area pain for 2 weeks, pain goes into left neck area.  NO known injury.

## 2021-06-03 DIAGNOSIS — K863 Pseudocyst of pancreas: Principal | ICD-10-CM

## 2021-06-03 MED ORDER — CREON 24,000-76,000-120,000 UNIT CAPSULE,DELAYED RELEASE
ORAL_CAPSULE | 6 refills | 0.00000 days
Start: 2021-06-03 — End: ?

## 2021-06-04 MED ORDER — AMLODIPINE 5 MG TABLET
ORAL_TABLET | 11 refills | 0 days
Start: 2021-06-04 — End: ?

## 2021-06-04 NOTE — Unmapped (Signed)
Already have medication refilled discard refill request

## 2021-06-05 MED ORDER — CREON 24,000-76,000-120,000 UNIT CAPSULE,DELAYED RELEASE
ORAL_CAPSULE | 0 refills | 0 days | Status: CP
Start: 2021-06-05 — End: ?

## 2021-06-05 NOTE — Unmapped (Signed)
Refill request received for patient.  pharmacy     Medication Requested: creon  Last Office Visit:   12/11/20  Next Office Visit:   06/18/21  Per Provider note:  Hx Pancreatitis - Now quiescent   ??? Old MRI shown that prior pancreatic pseudocyst resolved  ??? On Creon    Will refill medication as per standing order protocol

## 2021-06-08 NOTE — Unmapped (Signed)
[facility] Clinical Intervention    Type of intervention: medication overdose    Medication involved: Dupixent    Problem identified: Timothy Tapia called in to report he had taken his Dupixent injections once daily rather than once weekly. He is now out of medication and unsure how to proceed.    Intervention performed: I messaged with his provider and confirmed no monitoring needed, and he may resume with weekly dosing beginning 12/22 when insurance will allow again. I relayed plan with patient and set up a delivery on 12/22 via ups to prescription address.     Follow-up needed: NA    Approximate time spent: >20 minutes    Clinical evidence used to support intervention: Professional judgement    Tawanna Solo [facility] Specialty Pharmacist

## 2021-06-18 ENCOUNTER — Ambulatory Visit
Admit: 2021-06-18 | Discharge: 2021-06-19 | Payer: MEDICARE | Attending: Infectious Disease | Primary: Infectious Disease

## 2021-06-18 ENCOUNTER — Ambulatory Visit
Admit: 2021-06-18 | Discharge: 2021-06-19 | Payer: MEDICARE | Attending: Physician Assistant | Primary: Physician Assistant

## 2021-06-18 DIAGNOSIS — B20 Human immunodeficiency virus [HIV] disease: Principal | ICD-10-CM

## 2021-06-18 DIAGNOSIS — K2270 Barrett's esophagus without dysplasia: Principal | ICD-10-CM

## 2021-06-18 DIAGNOSIS — L309 Dermatitis, unspecified: Principal | ICD-10-CM

## 2021-06-18 DIAGNOSIS — R1310 Dysphagia, unspecified: Principal | ICD-10-CM

## 2021-06-18 DIAGNOSIS — E785 Hyperlipidemia, unspecified: Principal | ICD-10-CM

## 2021-06-18 LAB — LIPID PANEL
CHOLESTEROL/HDL RATIO SCREEN: 4.5 (ref 1.0–4.5)
CHOLESTEROL: 190 mg/dL (ref <=200)
HDL CHOLESTEROL: 42 mg/dL (ref 40–60)
LDL CHOLESTEROL CALCULATED: 115 mg/dL — ABNORMAL HIGH (ref 40–99)
NON-HDL CHOLESTEROL: 148 mg/dL — ABNORMAL HIGH (ref 70–130)
TRIGLYCERIDES: 163 mg/dL — ABNORMAL HIGH (ref 0–150)
VLDL CHOLESTEROL CAL: 32.6 mg/dL (ref 12–47)

## 2021-06-18 LAB — CBC W/ AUTO DIFF
BASOPHILS ABSOLUTE COUNT: 0.1 10*9/L (ref 0.0–0.1)
BASOPHILS RELATIVE PERCENT: 1.3 %
EOSINOPHILS ABSOLUTE COUNT: 0.1 10*9/L (ref 0.0–0.5)
EOSINOPHILS RELATIVE PERCENT: 1.5 %
HEMATOCRIT: 38.1 % — ABNORMAL LOW (ref 39.0–48.0)
HEMOGLOBIN: 12.8 g/dL — ABNORMAL LOW (ref 12.9–16.5)
LYMPHOCYTES ABSOLUTE COUNT: 1.9 10*9/L (ref 1.1–3.6)
LYMPHOCYTES RELATIVE PERCENT: 23.6 %
MEAN CORPUSCULAR HEMOGLOBIN CONC: 33.6 g/dL (ref 32.0–36.0)
MEAN CORPUSCULAR HEMOGLOBIN: 28.8 pg (ref 25.9–32.4)
MEAN CORPUSCULAR VOLUME: 85.8 fL (ref 77.6–95.7)
MEAN PLATELET VOLUME: 8.5 fL (ref 6.8–10.7)
MONOCYTES ABSOLUTE COUNT: 0.6 10*9/L (ref 0.3–0.8)
MONOCYTES RELATIVE PERCENT: 8 %
NEUTROPHILS ABSOLUTE COUNT: 5.3 10*9/L (ref 1.8–7.8)
NEUTROPHILS RELATIVE PERCENT: 65.6 %
NUCLEATED RED BLOOD CELLS: 0 /100{WBCs} (ref <=4)
PLATELET COUNT: 316 10*9/L (ref 150–450)
RED BLOOD CELL COUNT: 4.44 10*12/L (ref 4.26–5.60)
RED CELL DISTRIBUTION WIDTH: 16.6 % — ABNORMAL HIGH (ref 12.2–15.2)
WBC ADJUSTED: 8 10*9/L (ref 3.6–11.2)

## 2021-06-18 LAB — COMPREHENSIVE METABOLIC PANEL
ALBUMIN: 3.7 g/dL (ref 3.4–5.0)
ALKALINE PHOSPHATASE: 121 U/L — ABNORMAL HIGH (ref 46–116)
ALT (SGPT): 14 U/L (ref 10–49)
ANION GAP: 10 mmol/L (ref 5–14)
AST (SGOT): 8 U/L (ref <=34)
BILIRUBIN TOTAL: 0.2 mg/dL — ABNORMAL LOW (ref 0.3–1.2)
BLOOD UREA NITROGEN: 7 mg/dL — ABNORMAL LOW (ref 9–23)
BUN / CREAT RATIO: 10
CALCIUM: 9.6 mg/dL (ref 8.7–10.4)
CHLORIDE: 103 mmol/L (ref 98–107)
CO2: 24.2 mmol/L (ref 20.0–31.0)
CREATININE: 0.71 mg/dL
EGFR CKD-EPI (2021) MALE: >90 mL/min/{1.73_m2} (ref >=60)
GLUCOSE RANDOM: 196 mg/dL — ABNORMAL HIGH (ref 70–179)
POTASSIUM: 3.8 mmol/L (ref 3.4–4.8)
PROTEIN TOTAL: 6.8 g/dL (ref 5.7–8.2)
SODIUM: 137 mmol/L (ref 135–145)

## 2021-06-18 NOTE — Unmapped (Addendum)
For eczema:  - Continue dupilumab 300 mg injections under the skin once a week.   - Continue applying betamethasone ointment twice daily to active areas until smooth.   - Moisturize with Eucerin cream in a tub or vasline. You can mix with the betamethasone and then apply.

## 2021-06-18 NOTE — Unmapped (Unsigned)
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  - ??Biopsy 03/2019 w/ spongiosis, delicate parakeratosis, superficial dermal perivascular lymphocytic??infiltrate. Biopsy 03/2020 with??Epidermal acanthosis with focal spongiosis and prominent papillary dermal fibrosis.  - Started Dupixent 05/2020 and has responded well; previously discussed opt of JAK inhibitor -- some literature suggests JAK inhibitor use in HIV patients to reduce inflammation; pt has hx of DVT so not a candidate for JAK inhibitor though  - Previously treated for scabies infection with ivermectin to make sure all bases were covered  - Continue dupilumab 300 mg/2 mL PnIj; Inject 300 mg under the skin once a week.   - Continue betamethasone 0.05% ointment BID to active areas until smooth. Reviewed side effects of topical corticosteroids including atrophy, striae and dyschromia; however, discussed the current light areas are PIH and secondary to eczema and not the medications  - Encouraged to apply Eucerin daily??    The patient was advised to call for an appointment should any new, changing, or symptomatic lesions develop.     RTC: Return in about 3 months (around 09/16/2021) for follow up of eczema. or sooner as needed   _________________________________________________________________    Chief Complaint     Follow up of eczema    HPI     Timothy Tapia is a 55 y.o. male who presents as a returning patient (last seen by Dr. Charlsie Merles on 05/10/2021) to [facility] for follow up of eczema. At last visit, patient was to continue betamethasone 0.05% ointment and dupilumab 300 mg/2 mL injections once a week and to start ivermectin 3 mg tablets for the same.    Rash  - Reports his hands and legs have cleared up some since last visit  - He took ivermectin previously which he endorses caused pruritus  - Currently taking dupixent once a week and applying betamethasone twice daily with benefit  - Endorses pruritus which started in the past couple weeks    The patient denies any other new or changing lesions or areas of concern.     Pertinent Past Medical History     No history of skin cancer  HIV(+)    Family History:   Unknown     Past Medical History, Family History, Social History, Medication List, Allergies, and Problem List were reviewed in the rooming section of Epic.     ROS: Other than symptoms mentioned in the HPI, no fevers, chills, or other skin complaints    Physical Examination     GENERAL: Well-appearing male in no acute distress, resting comfortably.  NEURO: Alert and oriented, answers questions appropriately  PSYCH: Normal mood and affect  SKIN (Focal Skin Exam): Per patient request, examination of neck, chest, abdomen, back, arms, and legs was performed  - lichenified papules coalescing into plaques of the bilateral dorsal hands, forearms, thighs, and legs, sparing the popliteal fossae and trunk    All areas not commented on are within normal limits or unremarkable    Scribe's Attestation: Sharene Butters, PA obtained and performed the history, physical exam and medical decision making elements that were entered into the chart.  Signed by Mindi Junker, Scribe, on June 18, 2021 at 3:35 PM.    ----------------------------------------------------------------------------------------------------------------------  June 19, 2021 12:21 AM. Documentation assistance provided by the Scribe. I was present during the time the encounter was recorded. The information  recorded by the Scribe was done at my direction and has been reviewed and validated by me.  ------------------------------------------------------------------------------------------------------------------     (Approved Template 03/13/2020)

## 2021-06-18 NOTE — Unmapped (Signed)
PCP:  Lynnea Ferrier, MD    06/18/2021    This is a RETURN visit for this 55 y.o. male with the following diagnoses:    Patient Active Problem List    Diagnosis Date Noted   ??? Human immunodeficiency virus (HIV) disease (CMS-HCC) 02/04/1995   ??? Chronic bilateral low back pain 11/09/2019   ??? COPD (chronic obstructive pulmonary disease) (CMS-HCC) 05/27/2017   ??? Hypokalemia 05/27/2017   ??? GERD (gastroesophageal reflux disease) 05/27/2017   ??? Pancreatic pseudocyst 07/06/2015   ??? GI bleeding 07/06/2015   ??? ETOH abuse 07/06/2015   ??? DVT (deep venous thrombosis), IMV thrombus 10/07/13 provoked per hematology (15mo therapy) 10/25/2013   ??? Colitis 10/12/2013   ??? Pancreatitis 02/15/2013   ??? Tobacco use disorder 02/15/2013   ??? HTN (hypertension) 02/15/2013   ??? Barrett esophagus 02/15/2013   ??? Asthma 02/15/2013   ??? 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 CD4 is [phone]  ?? Persistently undetectable HIV RNA save for occasional blip.  ?? Says adherent   ?? On Prezcobix + Dolutegravir   ? 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 Tolerates current regimen.  ? Booster not desirable esp given injected steroids  ? Could consider fostesavir + dolutegravir. No change for now.     Substance Use  ?? 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 Was at  [facility] now left   ?? Says he is still sober  ?? Has daily calls with rehab center in [place]    Back and neck pain  ?? DJD   ?? Worsening  ?? MRI Sept 20, 2022   IMPRESSION: Interval progression of degenerative changes, causing severe neural foraminal narrowing at L5-S1 on the right at L5-S1 and moderate neural foraminal narrowing at L5-S1 on the left and L4-L5 bilaterally.    ?? [facility]   ?? Intraarticular corticosteroids have helped but diminishing returns  ?? Gabapentin 900 mg at bedtime is helping he says. He notices that his pain is worse and mobility tougher when he is off of this med.     Hx Pancreatitis - Now quiescent   ?? Old MRI shown that prior pancreatic pseudocyst resolved  ?? On Creon  ?? A1C was down last visit to 6.5%  ?? Denies polydipsia/phagia or frequent urination  ??  Barrett's Espohagitis  ?? On PPI  ?? Has some dysphagia  ?? Will ask GI to see and consider repeat EGD given Barretts and symptoms  ??  Smoking   ?? Smoking has increased as he has been dealing with addiction issues  ??  Mental Health  ?? See above  ?? On Remeron 30 mg at bedtime   ?? Watch for excessive somnolence given this + gabapentin.  ??  Health Maintenance   ?? Immunizations:  ?? HBV immune. HCV antibody positive but HCV viral load negative so not infected.   ?? C19 fully vaccinated and boosted x 2.   ?? Refuses flu shot.   ?? Cancer Screening:   ?? Colonoscopy in 2017. Multiple polyps. Repeat.   ?? Barrett's so EGDs  ?? HTN: Controlled today. Will watch. On ACEi plus Ca++ bloocker.   ?? Derm: Dermatology seeing for Atopic like dermatitis. On Dupixent.     Will follow up in 10 months.       Chief Complaint: Routine HIV follow-up  HPI:  Mr. Timothy Tapia returns for a routine appointment. See detailed A/P.     HIV-wise: Mostly stable CD4 and HIV RNA. Reports good adherence to ART.      ROS:   No fever chills, sweats, nausea, vomiting, diarrhea, rash, headache, joint pain. Neg for epigastric pain. Pos for LBP, dysphagia    All other systems are negative.    ALLERGIES:  Sulfa (sulfonamide antibiotics) and Sulfur    MEDICATIONS:  Current Outpatient Medications   Medication Sig Dispense Refill   ??? amLODIPine (NORVASC) 5 MG tablet Take 1 tablet (5 mg total) by mouth daily. 30 tablet 3   ??? betamethasone, augmented, (DIPROLENE) 0.05 % ointment Apply twice daily to active areas until smooth 150 g 5   ??? cetirizine (ZYRTEC) 10 MG tablet TAKE ONE TABLET BY MOUTH EVERY DAY 30 tablet 2   ??? CREON 24,000-76,000 -120,000 unit CpDR delayed release capsule TAKE 1 CAPSULE BY MOUTH THREE TIMES DAILY WITH A MEAL 90 capsule 0   ??? darunavir-cobicistat (PREZCOBIX) 800-150 mg-mg tablet Take 1 tablet by mouth daily. 30 tablet 6   ??? dolutegravir (TIVICAY) 50 mg TABLET Take 1 tablet (50 mg total) by mouth daily. 30 tablet 6   ??? dupilumab (DUPIXENT PEN) 300 mg/2 mL PnIj Inject the contents of 1 pen (300 mg) under the skin every fourteen (14) days. 4 mL 5   ??? dupilumab 300 mg/2 mL PnIj Inject the contents of 1 pen (300 mg) under the skin once a week. 8 mL 6   ??? empty container Misc Use as directed to dispose of Dupixent pens. 1 each 2   ??? enalapril (VASOTEC) 20 MG tablet TAKE 1 TABLET BY MOUTH EVERY DAY 30 tablet 11   ??? gabapentin (NEURONTIN) 300 MG capsule TAKE ONE CAPSULE BY MOUTH 3 TIMES A DAY 270 capsule 6   ??? halobetasol (ULTRAVATE) 0.05 % ointment APPLY TO AFFECTED AREA TWICE A DAY AS NEEDED AVOID FACE AND FOLDS 30 g 1   ??? halobetasol (ULTRAVATE) 0.05 % ointment Apply topically Two (2) times a day. To active areas until smooth 100 g 4   ??? hydrOXYzine (ATARAX) 25 MG tablet Take 1 tablet (25 mg total) by mouth at bedtime. Prn pruritus 30 tablet 0   ??? ivermectin (STROMECTOL) 3 mg Tab Take 5 tablets by mouth once today and then take another 5 tablets by mouth 1 week later 10 tablet 0   ??? methylPREDNISolone (MEDROL DOSEPACK) 4 mg tablet follow package directions 1 each 0   ??? mirtazapine (REMERON) 30 MG tablet Take 1 tablet (30 mg total) by mouth daily. 30 tablet 6   ??? pantoprazole (PROTONIX) 20 MG tablet Take 1 tablet (20 mg total) by mouth daily. 90 tablet 3   ??? permethrin (ELIMITE) 5 % cream Apply to entire body for 8 to 14 hours then rinse off, then repeat 1 week later 60 g 0   ??? triamcinolone (KENALOG) 0.1 % ointment Apply to itchy spots 2 times daily. For elbows at night time, apply medicine and then cover with saran wrap. 454 g 0   ??? VENTOLIN HFA 90 mcg/actuation inhaler INHALE 2 PUFFS UP TO FOUR TIMES DAILY AS NEEDED       No current facility-administered medications for this visit.     PE:  A&A. NAD. Chest CTA, Heart RRR. EXT w/o edema    DATA:    Results for orders placed or performed in visit on 12/11/20   Comprehensive Metabolic Panel  Result Value Ref Range    Sodium 142 135 - 145 mmol/L    Potassium 3.7 3.4 - 4.8 mmol/L    Chloride 106 98 - 107 mmol/L    CO2 26.7 20.0 - 31.0 mmol/L    Anion Gap 9 5 - 14 mmol/L    BUN 8 (L) 9 - 23 mg/dL    Creatinine 1.61 0.96 - 1.10 mg/dL    BUN/Creatinine Ratio 12     eGFR CKD-EPI (2021) Male >90 >=60 mL/min/1.16m2    Glucose 85 70 - 179 mg/dL    Calcium 9.4 8.7 - 04.5 mg/dL    Albumin 3.8 3.4 - 5.0 g/dL    Total Protein 6.9 5.7 - 8.2 g/dL    Total Bilirubin 0.3 0.3 - 1.2 mg/dL    AST 11 <=40 U/L    ALT 10 10 - 49 U/L    Alkaline Phosphatase 117 (H) 46 - 116 U/L   HIV RNA, Quantitative, PCR   Result Value Ref Range    HIV RNA Quant Result Not Detected Not Detected    HIV RNA      HIV RNA Log(10)     Hemoglobin A1c   Result Value Ref Range    Hemoglobin A1C 5.9 (H) 4.8 - 5.6 %    Estimated Average Glucose 123 mg/dL   LYMPH MARKER LIMITED,FLOW   Result Value Ref Range    CD3% (T Cells) 78 61 - 86 %    Absolute CD3 Count 1,248 915 - 3,400 /uL    CD4% (T Helper) 52 34 - 58 %    Absolute CD4 Count 832 510 - 2,320 /uL    CD8% T Suppressor 26 12 - 38 %    Absolute CD8 Count 416 180 - 1,520 /uL    CD4:CD8 Ratio 2.0 0.9 - 4.8   CBC w/ Differential   Result Value Ref Range    WBC 5.9 3.6 - 11.2 10*9/L    RBC 4.24 (L) 4.26 - 5.60 10*12/L    HGB 12.2 (L) 12.9 - 16.5 g/dL    HCT 98.1 (L) 19.1 - 48.0 %    MCV 83.9 77.6 - 95.7 fL    MCH 28.8 25.9 - 32.4 pg    MCHC 34.3 32.0 - 36.0 g/dL    RDW 47.8 (H) 29.5 - 15.2 %    MPV 8.1 6.8 - 10.7 fL    Platelet 364 150 - 450 10*9/L    nRBC 0 <=4 /100 WBCs    Neutrophils % 59.7 %    Lymphocytes % 27.5 %    Monocytes % 8.1 %    Eosinophils % 3.4 %    Basophils % 1.3 %    Absolute Neutrophils 3.5 1.8 - 7.8 10*9/L    Absolute Lymphocytes 1.6 1.1 - 3.6 10*9/L    Absolute Monocytes 0.5 0.3 - 0.8 10*9/L    Absolute Eosinophils 0.2 0.0 - 0.5 10*9/L    Absolute Basophils 0.1 0.0 - 0.1 10*9/L     *Note: Due to a large number of results and/or encounters for the requested time period, some results have not been displayed. A complete set of results can be found in Results Review.        Creatinine   Date Value Ref Range Status   12/11/2020 0.65 0.60 - 1.10 mg/dL Final   62/13/0865 7.84 0.70 - 1.30 mg/dL Final   69/62/9528 4.13 0.70 - 1.30 mg/dL Final   24/40/1027 2.53 0.70 - 1.30 mg/dL Final  05/13/2019 0.84 0.76 - 1.27 mg/dL Final   16/04/9603 5.40 0.70 - 1.30 mg/dL Final   98/05/9146 8.29 0.70 - 1.30 mg/dL Final   56/21/3086 5.78 0.70 - 1.30 mg/dL Final       Triglycerides   Date Value Ref Range Status   03/20/2020 180 (H) 0 - 150 mg/dL Final   46/96/2952 841 1 - 149 mg/dL Final     HDL   Date Value Ref Range Status   03/20/2020 40 40 - 60 mg/dL Final   32/44/0102 79 (H) 40 - 59 mg/dL Final     LDL Calculated   Date Value Ref Range Status   03/20/2020 123 (H) 40 - 99 mg/dL Final     Comment:     NHLBI Recommended Ranges, LDL Cholesterol, for Adults (20+yrs) (ATPIII), mg/dL  Optimal              <725  Near Optimal        100-129  Borderline High     130-159  High                160-189  Very High            >=190  NHLBI Recommended Ranges, LDL Cholesterol, for Children (2-19 yrs), mg/dL  Desirable            <366  Borderline High     110-129  High                 >=130       LDL Cholesterol, Calculated   Date Value Ref Range Status   02/15/2013 100 mg/dL Final     Comment:     :  ADULTS (20 years or older)  Optimal         <100  Near Optimal    100-129  Borderline High 130-159  High            160-189  Very High       >=190  CHILDREN (2-19 years)  Desirable       <110  Borderline High 110-129  High            >/= 130       IMAGING STUDIES:  None

## 2021-06-19 LAB — HIV RNA, QUANTITATIVE, PCR
HIV RNA LOG(10): 1.63 {Log_copies}/mL — ABNORMAL HIGH (ref <0.00)
HIV RNA QNT RSLT: DETECTED — AB
HIV RNA: 43 {copies}/mL — ABNORMAL HIGH (ref <0)

## 2021-06-19 NOTE — Unmapped (Signed)
Medical Case Management Social Work Note     Duration of Intervention: 15 minutes    TYPE OF CONTACT: Phone    ASSESSMENT: Pt left voicemail stating he needs a new local dental provider in [place]. He says he tried to make an appt with [facility] and they sent [place] a medical clearance form last week. He is unsure if this form was sent back to them so that he can schedule.     [facility]   [address] [place], [place] [address]  Phone: [phone]  [email or web address]     ADHERENCE: Did not discuss at this interaction due to competing priorities and/or no concerns.    INTERVENTION:  SW researched the following dentists in network with pts primary insurance [employer]     PLAN:  Will try back at the [facility] if no reply.     Bradly Bienenstock, LCSW, CHES  [facility] Clinic Lead Social Worker

## 2021-06-20 MED FILL — DUPIXENT 300 MG/2 ML SUBCUTANEOUS PEN INJECTOR: SUBCUTANEOUS | 28 days supply | Qty: 8 | Fill #1

## 2021-06-27 MED ORDER — ENALAPRIL MALEATE 20 MG TABLET
ORAL_TABLET | 2 refills | 0 days | Status: CP
Start: 2021-06-27 — End: ?

## 2021-06-27 NOTE — Unmapped (Signed)
Pt last seen this month in clinic.Pt to F/U in 10 months.Enalapril 20mg  daily #90 with 2 refills escribed

## 2021-06-28 DIAGNOSIS — K863 Pseudocyst of pancreas: Principal | ICD-10-CM

## 2021-06-28 MED ORDER — VENTOLIN HFA 90 MCG/ACTUATION AEROSOL INHALER
11 refills | 0.00000 days
Start: 2021-06-28 — End: ?

## 2021-06-28 MED ORDER — CREON 24,000-76,000-120,000 UNIT CAPSULE,DELAYED RELEASE
ORAL_CAPSULE | 5 refills | 0 days | Status: CP
Start: 2021-06-28 — End: ?

## 2021-06-28 NOTE — Unmapped (Signed)
Patient last seen in clinic this month and pt to continue Creon. #90 with 5 refills escribed.     Dr.Wohl, There is no active Rx for ventolin. Would you like to refill?

## 2021-07-10 NOTE — Unmapped (Signed)
Patient has an appointment on February 20th at 1:30

## 2021-07-10 NOTE — Unmapped (Signed)
Timothy Tapia reports his itching has returned since injecting weekly Dupixent. He felt most relieved taking daily injections and questioned if he could continue taking that way. I explained likely not, but I'd loop in his provider given concerns. He also plans to call clinic.     [facility] Clinical Assessment & Refill Coordination Note    Timothy Tapia, DOB: Sep 08, 1965  Phone: [phone] (home) [phone] (work)    All above HIPAA information was verified with patient.     Was a Nurse, learning disability used for this call? No    Specialty Medication(s):   Inflammatory Disorders: Dupixent     Current Outpatient Medications   Medication Sig Dispense Refill   ??? amLODIPine (NORVASC) 5 MG tablet Take 1 tablet (5 mg total) by mouth daily. 30 tablet 3   ??? betamethasone, augmented, (DIPROLENE) 0.05 % ointment Apply twice daily to active areas until smooth 150 g 5   ??? cetirizine (ZYRTEC) 10 MG tablet TAKE ONE TABLET BY MOUTH EVERY DAY 30 tablet 2   ??? CREON 24,000-76,000 -120,000 unit CpDR delayed release capsule TAKE 1 CAPSULE BY MOUTH THREE TIMES DAILY WITH A MEAL 90 capsule 5   ??? darunavir-cobicistat (PREZCOBIX) 800-150 mg-mg tablet Take 1 tablet by mouth daily. 30 tablet 6   ??? dolutegravir (TIVICAY) 50 mg TABLET Take 1 tablet (50 mg total) by mouth daily. 30 tablet 6   ??? dupilumab 300 mg/2 mL PnIj Inject the contents of 1 pen (300 mg) under the skin once a week. 8 mL 6   ??? empty container Misc Use as directed to dispose of Dupixent pens. 1 each 2   ??? enalapril (VASOTEC) 20 MG tablet TAKE ONE TABLET BY MOUTH EVERY DAY 90 tablet 2   ??? gabapentin (NEURONTIN) 300 MG capsule TAKE ONE CAPSULE BY MOUTH 3 TIMES A DAY 270 capsule 6   ??? halobetasol (ULTRAVATE) 0.05 % ointment APPLY TO AFFECTED AREA TWICE A DAY AS NEEDED AVOID FACE AND FOLDS 30 g 1   ??? halobetasol (ULTRAVATE) 0.05 % ointment Apply topically Two (2) times a day. To active areas until smooth 100 g 4   ??? hydrOXYzine (ATARAX) 25 MG tablet Take 1 tablet (25 mg total) by mouth at bedtime. Prn pruritus 30 tablet 0   ??? ivermectin (STROMECTOL) 3 mg Tab Take 5 tablets by mouth once today and then take another 5 tablets by mouth 1 week later 10 tablet 0   ??? methylPREDNISolone (MEDROL DOSEPACK) 4 mg tablet follow package directions 1 each 0   ??? mirtazapine (REMERON) 30 MG tablet Take 1 tablet (30 mg total) by mouth daily. 30 tablet 6   ??? pantoprazole (PROTONIX) 20 MG tablet Take 1 tablet (20 mg total) by mouth daily. 90 tablet 3   ??? permethrin (ELIMITE) 5 % cream Apply to entire body for 8 to 14 hours then rinse off, then repeat 1 week later 60 g 0   ??? triamcinolone (KENALOG) 0.1 % ointment Apply to itchy spots 2 times daily. For elbows at night time, apply medicine and then cover with saran wrap. 454 g 0   ??? VENTOLIN HFA 90 mcg/actuation inhaler INHALE 2 PUFFS UP TO FOUR TIMES DAILY AS NEEDED       No current facility-administered medications for this visit.        Changes to medications: Timothy Tapia reports no changes at this time.    Allergies   Allergen Reactions   ??? Sulfa (Sulfonamide Antibiotics) Hives   ??? Sulfur  Hives       Changes to allergies: No    SPECIALTY MEDICATION ADHERENCE     Dupixent - 1 left     Specialty medication(s) dose(s) confirmed: Regimen is correct and unchanged.     Are there any concerns with adherence? No    Adherence counseling provided? Not needed    CLINICAL MANAGEMENT AND INTERVENTION      Clinical Benefit Assessment:    Do you feel the medicine is effective or helping your condition? yes, but not fully    Clinical Benefit counseling provided? consulted provider regarding clinical benefit concerns    Adverse Effects Assessment:    Are you experiencing any side effects? No    Are you experiencing difficulty administering your medicine? No    Quality of Life Assessment:    Quality of Life    Rheumatology  Oncology  Dermatology  Cystic Fibrosis          Have you discussed this with your provider? Not needed    Acute Infection Status:    Acute infections noted within Epic:  No active infections  Patient reported infection: None    Therapy Appropriateness:    Is therapy appropriate and patient progressing towards therapeutic goals? Yes, therapy is appropriate and should be continued    DISEASE/MEDICATION-SPECIFIC INFORMATION      For patients on injectable medications: Patient currently has 1 doses left.  Next injection is scheduled for Wed, 1/10.    PATIENT SPECIFIC NEEDS     - Does the patient have any physical, cognitive, or cultural barriers? No    - Is the patient high risk? No    Does the patient require a Care Management Plan? No     SHIPPING     Specialty Medication(s) to be Shipped:   Inflammatory Disorders: Dupixent    Other medication(s) to be shipped: No additional medications requested for fill at this time     Changes to insurance: No    Delivery Scheduled: Yes, Expected medication delivery date: Thurs, 1/12.     Medication will be delivered via UPS to the confirmed prescription address in [facility].    The patient will receive a drug information handout for each medication shipped and additional FDA Medication Guides as required.  Verified that patient has previously received a Conservation officer, historic buildings and a Surveyor, mining.    The patient or caregiver noted above participated in the development of this care plan and knows that they can request review of or adjustments to the care plan at any time.      All of the patient's questions and concerns have been addressed.    Lanney Gins   [facility] Specialty Pharmacist

## 2021-07-11 MED FILL — DUPIXENT 300 MG/2 ML SUBCUTANEOUS PEN INJECTOR: SUBCUTANEOUS | 28 days supply | Qty: 8 | Fill #2

## 2021-07-13 ENCOUNTER — Ambulatory Visit
Admit: 2021-07-13 | Discharge: 2021-07-13 | Disposition: A | Discharge status: Home / Self Care | Payer: MEDICARE | Attending: Emergency Medicine

## 2021-07-13 ENCOUNTER — Emergency Department
Admit: 2021-07-13 | Discharge: 2021-07-13 | Disposition: A | Discharge status: Home / Self Care | Payer: MEDICARE | Attending: Emergency Medicine

## 2021-07-13 DIAGNOSIS — R079 Chest pain, unspecified: Principal | ICD-10-CM

## 2021-07-13 DIAGNOSIS — F439 Reaction to severe stress, unspecified: Principal | ICD-10-CM

## 2021-07-13 LAB — CBC W/ AUTO DIFF
BASOPHILS ABSOLUTE COUNT: 0.1 10*9/L (ref 0.0–0.2)
BASOPHILS RELATIVE PERCENT: 1.3 %
EOSINOPHILS ABSOLUTE COUNT: 0.1 10*9/L (ref 0.0–0.5)
EOSINOPHILS RELATIVE PERCENT: 1.2 %
HEMATOCRIT: 42.9 % (ref 40.0–50.0)
HEMOGLOBIN: 13.9 g/dL — ABNORMAL LOW (ref 14.0–17.0)
LYMPHOCYTES ABSOLUTE COUNT: 2 10*9/L (ref 1.2–4.3)
LYMPHOCYTES RELATIVE PERCENT: 27 %
MEAN CORPUSCULAR HEMOGLOBIN CONC: 32.5 g/dL (ref 32.0–36.5)
MEAN CORPUSCULAR HEMOGLOBIN: 28.6 pg (ref 26.0–34.0)
MEAN CORPUSCULAR VOLUME: 88 fL (ref 80.0–99.0)
MEAN PLATELET VOLUME: 7.9 fL — ABNORMAL LOW (ref 9.0–12.4)
MONOCYTES ABSOLUTE COUNT: 0.5 10*9/L (ref 0.3–1.0)
MONOCYTES RELATIVE PERCENT: 6.8 %
NEUTROPHILS ABSOLUTE COUNT: 4.6 10*9/L (ref 2.0–8.0)
NEUTROPHILS RELATIVE PERCENT: 63.7 %
NUCLEATED RED BLOOD CELLS: 0 /100{WBCs} (ref <=0)
PLATELET COUNT: 350 10*9/L (ref 150–450)
RED BLOOD CELL COUNT: 4.88 10*12/L (ref 4.50–5.80)
RED CELL DISTRIBUTION WIDTH: 16.6 % — ABNORMAL HIGH (ref 11.5–14.5)
WBC ADJUSTED: 7.3 10*9/L (ref 4.0–10.0)

## 2021-07-13 LAB — COMPREHENSIVE METABOLIC PANEL
ALBUMIN: 4.1 g/dL (ref 3.4–5.0)
ALKALINE PHOSPHATASE: 146 U/L — ABNORMAL HIGH (ref 46–116)
ALT (SGPT): 11 U/L (ref 10–49)
ANION GAP: 10 mmol/L (ref 5–14)
AST (SGOT): 14 U/L (ref <=34)
BILIRUBIN TOTAL: 0.4 mg/dL (ref 0.3–1.2)
BLOOD UREA NITROGEN: <5 mg/dL — ABNORMAL LOW (ref 9–23)
CALCIUM: 9.6 mg/dL (ref 8.7–10.4)
CHLORIDE: 105 mmol/L (ref 98–107)
CO2: 24 mmol/L (ref 20.0–31.0)
CREATININE: 0.8 mg/dL
EGFR CKD-EPI (2021) MALE: >90 mL/min/{1.73_m2} (ref >=60)
GLUCOSE RANDOM: 166 mg/dL (ref 70–179)
POTASSIUM: 3.9 mmol/L (ref 3.4–4.8)
PROTEIN TOTAL: 7.3 g/dL (ref 5.7–8.2)
SODIUM: 139 mmol/L (ref 135–145)

## 2021-07-13 LAB — HIGH SENSITIVITY TROPONIN I - SERIAL: HIGH SENSITIVITY TROPONIN I: <3 ng/L (ref <=53)

## 2021-07-13 LAB — HIGH SENSITIVITY TROPONIN I - 2 HOUR SERIAL
HIGH SENSITIVITY TROPONIN - DELTA (0-2H): 0 ng/L (ref <=7)
HIGH-SENSITIVITY TROPONIN I - 2 HOUR: <3 ng/L (ref <=53)

## 2021-07-13 LAB — MAGNESIUM: MAGNESIUM: 1.7 mg/dL (ref 1.6–2.6)

## 2021-07-13 LAB — D-DIMER, QUANTITATIVE: D-DIMER QUANTITATIVE (RX,NS,MG,VD): <0.27 ug{FEU}/mL (ref 0.00–0.50)

## 2021-07-13 MED ADMIN — acetaminophen (TYLENOL) tablet 650 mg: 650 mg | ORAL | @ 18:00:00 | Stop: 2021-07-13

## 2021-07-13 NOTE — Unmapped (Signed)
Emergency Department   ED Provider Note  Room: 05-P/05-P    Medical Decision Making     Timothy Tapia is a 56 y.o. male with PMHx of HIV disease, HTN, bronchitis, GERD, thrombus, pancreatitis, Barrett's esophagus, colon polyps, pancreatic pseudocyst, tobacco use, asthma, GI bleed, alcohol use, COPD, chronic low back pain, and eczema presenting to the ED with a complaint of sharp chest pain onset this morning radiating into his left shoulder.  Vital stable.  No acute distress. Reassuring exam. RRR. Lungs clear. Benign abdomen. Labs/UA to evaluate for evidence of infection, electrolyte abnormality, renal function, anemia.  CXR to evaluate for consolidation, pulmonary edema.  EKG plus serial troponins to evaluate for ACS, cardiac etiology. COVID/flu/RSV ordered. Tylenol ordered for pain. See ED course for plan implementation and reevaluation.     Initial Labs/Studies:  Orders Placed This Encounter   Procedures   ??? Rapid Influenza/RSV/COVID PCR   ??? XR Chest 2 views   ??? hsTroponin I (serial 0-2-6H w/ delta)   ??? Comprehensive Metabolic Panel   ??? CBC w/ Differential   ??? Magnesium Level   ??? hsTroponin I - 2 Hour   ??? hsTroponin I - 6 Hour   ??? ECG 12 Lead   ??? ECG 12 Lead   ??? ECG 12 Lead   ??? Insert peripheral IV      Initial Medications/Treatments:  Medications   acetaminophen (TYLENOL) tablet 650 mg (650 mg Oral Given 07/13/21 1321)      Independent Interpretation of Studies: CXR without focal consolidation.   External Records Reviewed: 06/18/2021 Infectious disease/PCP progress note. Patient compliant with HIV treatment.  History of cocaine use.  Chronic back and neck pain.  Escalation of Care, Consideration of Admission/Observation/Transfer: Admission not required. Appropriate for outpatient management.    Social determinants that significantly affected care: SDOH: Substance Abuse   Prescription Medications Considered But Not Prescribed: Prescription pain medication considered, patient comfortable with Ibuprofen and Tylenol.    Progress Notes     ED Course as of 07/13/21 1916   Fri Jul 13, 2021   1550 HGB(!): 13.9  Stable, on chart review similar to previous.    1550 hsTroponin I: <3   1551 EKG NSR. No STEMI   1552 CXR negative for acute process.    1600 Checked on patient.  Patient currently eating [employer].  Patient reports mild chest pain at this time, but feeling better after the tylenol. Patient believes it is related to stress but has never had chest pain like this before.    1616 Will add on D-dimer rule out PE.    1627 hsTroponin I: <3  Delta 0. Not concerned for ACS at this time.    1659 D-Dimer: <0.27  No concern for PE.    1707 Checked on patient. Discussed with patient work up, relevant results, and plan for discharge. Patient was given ED warnings, discharge instructions, and follow up instructions. Recommend follow up with PCP. Tylenol for pain.  Information provided for behavioral health resources to assist with increased dose at home.  Patient understands and agrees with plan for discharge. Patient was informed and verbalizes understanding to return to ER immediately if symptoms worsen, persist, new symptoms or follow up cannot be obtained. Any questions have been addressed. Patient feels comfortable being discharged at this time.         Vitals:    07/13/21 1634 07/13/21 1659 07/13/21 1707 07/13/21 1734   BP: 126/76   126/76   Pulse: 66  78    Resp: 22  21    Temp:  37 ??C (98.6 ??F)     TempSrc:       SpO2:   100%    Weight:       Height:           Disposition     Clinical Impression:   Final diagnoses:   Chest pain, unspecified type (Primary)   Stress at home       Final Disposition: discharge      History     Chief Complaint:  Chest Pain       History provided by patient    HPI:  Timothy Tapia is a 56 y.o. male with PMHx of HIV disease, HTN, bronchitis, GERD, thrombus, pancreatitis, Barrett's esophagus, colon polyps, pancreatic pseudocyst, tobacco use, asthma, GI bleed, alcohol use, COPD, chronic low back pain, and eczema presenting to the ED with a complaint of sharp chest pain onset this morning radiating into his left shoulder. Pain is a little bit worse with moving the left arm.  Patient also endorses some shortness of breat associated with the chest pain.  Patient states he has been under a lot of stress at home.  Reports his significant other has cancer.  Patient states he has appointment with PCP in February.  Patient is compliant with his HIV medication.  At present he also endorses some fatigue. Patient denies abdominal pain, nausea, vomiting, changes in bowel or bladder habits.  He denies recent fever or cough. EMS gave 324mg  aspirin prior to arrival.  History of DVT. Patient not on blood thinners. Denies SI/HI.    ROS: See HPI, all other systems reviewed and negative    MEDICATIONS:   Discharge Medication List as of 07/13/2021  5:26 PM      CONTINUE these medications which have NOT CHANGED    Details   amLODIPine (NORVASC) 5 MG tablet Take 1 tablet (5 mg total) by mouth daily., Starting Tue 05/29/2021, Normal      betamethasone, augmented, (DIPROLENE) 0.05 % ointment Apply twice daily to active areas until smooth, Normal      cetirizine (ZYRTEC) 10 MG tablet TAKE ONE TABLET BY MOUTH EVERY DAY, Normal      CREON 24,000-76,000 -120,000 unit CpDR delayed release capsule TAKE 1 CAPSULE BY MOUTH THREE TIMES DAILY WITH A MEAL, Normal      darunavir-cobicistat (PREZCOBIX) 800-150 mg-mg tablet Take 1 tablet by mouth daily., Starting Thu 03/29/2021, Normal      dolutegravir (TIVICAY) 50 mg TABLET Take 1 tablet (50 mg total) by mouth daily., Starting Thu 03/29/2021, Normal      dupilumab 300 mg/2 mL PnIj Inject the contents of 1 pen (300 mg) under the skin once a week., Starting Thu 05/10/2021, Until Wed 08/15/2021, Normal      empty container Misc Use as directed to dispose of Dupixent pens., Starting Fri 05/12/2020, Normal      enalapril (VASOTEC) 20 MG tablet TAKE ONE TABLET BY MOUTH EVERY DAY, Normal      gabapentin (NEURONTIN) 300 MG capsule TAKE ONE CAPSULE BY MOUTH 3 TIMES A DAY, Normal      !! halobetasol (ULTRAVATE) 0.05 % ointment APPLY TO AFFECTED AREA TWICE A DAY AS NEEDED AVOID FACE AND FOLDS, Normal      !! halobetasol (ULTRAVATE) 0.05 % ointment Apply topically Two (2) times a day. To active areas until smooth, Starting Tue 10/31/2020, Until Wed 10/31/2021, Normal      hydrOXYzine (ATARAX) 25  MG tablet Take 1 tablet (25 mg total) by mouth at bedtime. Prn pruritus, Starting Thu 05/10/2021, Normal      ivermectin (STROMECTOL) 3 mg Tab Take 5 tablets by mouth once today and then take another 5 tablets by mouth 1 week later, Normal      methylPREDNISolone (MEDROL DOSEPACK) 4 mg tablet follow package directions, Normal      mirtazapine (REMERON) 30 MG tablet Take 1 tablet (30 mg total) by mouth daily., Starting Mon 03/12/2021, Until Thu 04/11/2022, Normal      pantoprazole (PROTONIX) 20 MG tablet Take 1 tablet (20 mg total) by mouth daily., Starting Mon 02/19/2021, Until Tue 02/19/2022, Normal      permethrin (ELIMITE) 5 % cream Apply to entire body for 8 to 14 hours then rinse off, then repeat 1 week later, Normal      triamcinolone (KENALOG) 0.1 % ointment Apply to itchy spots 2 times daily. For elbows at night time, apply medicine and then cover with saran wrap., Normal      VENTOLIN HFA 90 mcg/actuation inhaler INHALE 2 PUFFS UP TO FOUR TIMES DAILY AS NEEDED, Historical Med       !! - Potential duplicate medications found. Please discuss with provider.          ALLERGIES:   Allergies   Allergen Reactions   ??? Sulfa (Sulfonamide Antibiotics) Hives   ??? Sulfur Hives       PAST MEDICAL HISTORY:    Past Medical History:   Diagnosis Date   ??? Acute pancreatitis    ??? Barrett esophagus    ??? Black stool 07/08/2017   ??? Bronchitis    ??? Colon polyp    ??? Eczema    ??? Gastroesophageal reflux disease    ??? HIV disease (CMS-HCC)    ??? Hypertension    ??? Pancreatic pseudocyst    ??? Thrombus     IMV thrombus r/t colitis & pancreatitis 10/07/13 PAST SURGICAL HISTORY:   Past Surgical History:   Procedure Laterality Date   ??? esophogus surgery     ??? PR COLONOSCOPY W/BIOPSY SINGLE/MULTIPLE  10/11/2013    Procedure: COLONOSCOPY, FLEXIBLE, PROXIMAL TO SPLENIC FLEXURE; WITH BIOPSY, SINGLE OR MULTIPLE;  Surgeon: Teodoro Spray, MD;  [facility];  Service: Gastroenterology   ??? PR COLONOSCOPY W/BIOPSY SINGLE/MULTIPLE N/A 09/29/2014    Procedure: COLONOSCOPY, FLEXIBLE, PROXIMAL TO SPLENIC FLEXURE; WITH BIOPSY, SINGLE OR MULTIPLE;  Surgeon: Bronson Curb, MD;  Location: GI PROCEDURES [facility] [facility];  Service: Gastroenterology   ??? PR COLONOSCOPY W/BIOPSY SINGLE/MULTIPLE N/A 09/19/2015    Procedure: COLONOSCOPY, FLEXIBLE, PROXIMAL TO SPLENIC FLEXURE; WITH BIOPSY, SINGLE OR MULTIPLE;  Surgeon: Mayford Knife, MD;  [facility]: [facility];  Service: Gastroenterology   ??? PR COLSC FLX W/RMVL OF TUMOR POLYP LESION SNARE TQ N/A 09/19/2015    Procedure: COLONOSCOPY FLEX; W/REMOV TUMOR/LES BY SNARE;  Surgeon: Mayford Knife, MD;  [facility]: [facility];  Service: Gastroenterology   ??? PR ENDOSCOPIC US EXAM, ESOPH N/A 08/10/2015    Procedure: UGI ENDOSCOPY; WITH ENDOSCOPIC ULTRASOUND EXAMINATION LIMITED TO THE ESOPHAGUS;  Surgeon: Vonda Antigua, MD;  Location: [facility];  Service: Gastroenterology   ??? PR UPGI ENDOSCOPY,FN NEEDLE BX,GUIDED N/A 09/22/2014    Procedure: UGI W/TRANSENDOSCOPIC US-GUIDE INTRA/TRANSMURAL NEEDLE ASP/BX (INCL EXAM ESOPHAGUS, STOMACH, DOUDENUM/JEJ);  Surgeon: Ned Grace, MD;  [facility]: [facility];  Service: Gastroenterology   ??? PR UPPER GI ENDOSCOPY,BIOPSY N/A 09/19/2015  Procedure: UGI ENDOSCOPY; WITH BIOPSY, SINGLE OR MULTIPLE;  Surgeon: Mayford Knife, MD;  [facility];  Service: Gastroenterology   ??? PR UPPER GI ENDOSCOPY,BIOPSY N/A 07/14/2017    Procedure: UGI ENDOSCOPY; WITH BIOPSY, SINGLE OR MULTIPLE;  Surgeon: Alfred Levins, MD;  Location: [facility];  Service: Gastroenterology       SOCIAL HISTORY:   Social History     Tobacco Use   ??? Smoking status: Every Day     Packs/day: 0.25     Years: 27.00     Pack years: 6.75     Types: Cigarettes     Start date: 06/28/1993   ??? Smokeless tobacco: Never   ??? Tobacco comments:     1 pack lasts 4 days; referred to [facility]; Taking nicorette gum   Substance Use Topics   ??? Alcohol use: Not Currently     Alcohol/week: 3.0 standard drinks     Types: 3 Cans of beer per week     Comment: Na       FAMILY HISTORY:    Family History   Problem Relation Age of Onset   ??? Hypertension Mother    ??? Diabetes Mother    ??? COPD Father    ??? Pancreatic cancer Neg Hx    ??? Pancreatitis Neg Hx    ??? Melanoma Neg Hx    ??? Basal cell carcinoma Neg Hx    ??? Squamous cell carcinoma Neg Hx        Physical Exam     Vitals:    07/13/21 1734   BP: 126/76   Pulse:    Resp:    Temp:    SpO2:      Reviewed vital signs and nursing note as charted by RN.    CONSTITUTIONAL: Alert and oriented and responds appropriately to questions. Well-appearing; well-nourished; in no acute distress  HEAD: Normocephalic; atraumatic  EYES: Extraocular movements intact; Conjunctivae clear, sclera non-icteric  ENT: Normal nose; no epistaxis; moist mucous membranes;  NECK: Supple, trachea midline  CARD: RRR; no murmurs, symmetric distal pulses  RESP: Normal work of breathing; breath sounds clear and equal bilaterally; no wheezes, no rhonchi, no rales  ABD/GI: Normal bowel sounds; non-distended; soft, nontender, no rebound, no guarding; no palpable organomegaly or masses  BACK:  The back appears normal, there is no CVA tenderness  EXT: No deformity. Normal ROM in all joints; non-tender to palpation; no cyanosis, no effusions, no edema  SKIN: Normal color for age and race; warm; dry; good turgor; no acute lesions noted  NEURO: No focal deficits. Moves all extremities equally. Motor and sensory function intact.   PSYCH: The patient's mood and manner are appropriate. Grooming and personal hygiene are appropriate.    Results     Pertinent labs & imaging results that were available during my care of the patient were reviewed by me and considered in my medical decision making (see chart for details).    Results for orders placed or performed during the hospital encounter of 07/13/21   Rapid Influenza/RSV/COVID PCR    Specimen: Nasopharyngeal Swab   Result Value Ref Range    SARS-CoV-2 PCR Negative Negative    Influenza A Negative Negative    Influenza B Negative Negative    RSV Negative Negative   hsTroponin I (serial 0-2-6H w/ delta)   Result Value Ref Range    hsTroponin I <3 <=53 ng/L   Comprehensive Metabolic Panel   Result  Value Ref Range    Sodium 139 135 - 145 mmol/L    Potassium 3.9 3.4 - 4.8 mmol/L    Chloride 105 98 - 107 mmol/L    CO2 24.0 20.0 - 31.0 mmol/L    Anion Gap 10 5 - 14 mmol/L    BUN <5 (L) 9 - 23 mg/dL    Creatinine 1.61 0.96 - 1.10 mg/dL    eGFR CKD-EPI (0454) Male >90 >=60 mL/min/1.22m2    Glucose 166 70 - 179 mg/dL    Calcium 9.6 8.7 - 09.8 mg/dL    Albumin 4.1 3.4 - 5.0 g/dL    Total Protein 7.3 5.7 - 8.2 g/dL    Total Bilirubin 0.4 0.3 - 1.2 mg/dL    AST 14 <=11 U/L    ALT 11 10 - 49 U/L    Alkaline Phosphatase 146 (H) 46 - 116 U/L   Magnesium Level   Result Value Ref Range    Magnesium 1.7 1.6 - 2.6 mg/dL   hsTroponin I - 2 Hour   Result Value Ref Range    hsTroponin I <3 <=53 ng/L    delta hsTroponin I 0 <=7 ng/L   D-Dimer, Quantitative   Result Value Ref Range    D-Dimer <0.27 0.00 - 0.50 mcg/mL FEU   ECG 12 Lead   Result Value Ref Range    EKG Systolic BP  mmHg    EKG Diastolic BP  mmHg    EKG Ventricular Rate 66 BPM    EKG Atrial Rate 66 BPM    EKG P-R Interval 146 ms    EKG QRS Duration 104 ms    EKG Q-T Interval 408 ms    EKG QTC Calculation 427 ms    EKG Calculated P Axis 59 degrees    EKG Calculated R Axis -13 degrees    EKG Calculated T Axis 46 degrees    QTC Fredericia 421 ms   ECG 12 Lead   Result Value Ref Range    EKG Systolic BP  mmHg    EKG Diastolic BP  mmHg    EKG Ventricular Rate 74 BPM    EKG Atrial Rate 75 BPM    EKG P-R Interval 148 ms    EKG QRS Duration 98 ms    EKG Q-T Interval 382 ms    EKG QTC Calculation 424 ms    EKG Calculated P Axis 60 degrees    EKG Calculated R Axis 15 degrees    EKG Calculated T Axis 64 degrees    QTC Fredericia 410 ms   ECG 12 Lead   Result Value Ref Range    EKG Systolic BP  mmHg    EKG Diastolic BP  mmHg    EKG Ventricular Rate 69 BPM    EKG Atrial Rate 69 BPM    EKG P-R Interval 156 ms    EKG QRS Duration 96 ms    EKG Q-T Interval 396 ms    EKG QTC Calculation 424 ms    EKG Calculated P Axis 55 degrees    EKG Calculated R Axis 0 degrees    EKG Calculated T Axis 63 degrees    QTC Fredericia 415 ms   CBC w/ Differential   Result Value Ref Range    WBC 7.3 4.0 - 10.0 10*9/L    RBC 4.88 4.50 - 5.80 10*12/L    HGB 13.9 (L) 14.0 - 17.0 g/dL    HCT 91.4 78.2 - 95.6 %    MCV  88.0 80.0 - 99.0 fL    MCH 28.6 26.0 - 34.0 pg    MCHC 32.5 32.0 - 36.5 g/dL    RDW 16.1 (H) 09.6 - 14.5 %    MPV 7.9 (L) 9.0 - 12.4 fL    Platelet 350 150 - 450 10*9/L    nRBC 0 <=0 /100 WBCs    Neutrophils % 63.7 %    Lymphocytes % 27.0 %    Monocytes % 6.8 %    Eosinophils % 1.2 %    Basophils % 1.3 %    Absolute Neutrophils 4.6 2.0 - 8.0 10*9/L    Absolute Lymphocytes 2.0 1.2 - 4.3 10*9/L    Absolute Monocytes 0.5 0.3 - 1.0 10*9/L    Absolute Eosinophils 0.1 0.0 - 0.5 10*9/L    Absolute Basophils 0.1 0.0 - 0.2 10*9/L     XR Chest 2 views   Final Result   No new acute cardiopulmonary pathology evident.      Signed (Electronic Signature): 07/13/2021 11:19 AM    Signed By: Gardiner Barefoot        Encounter Date: 07/13/21   ECG 12 Lead   Result Value    EKG Systolic BP     EKG Diastolic BP     EKG Ventricular Rate 74    EKG Atrial Rate 75    EKG P-R Interval 148    EKG QRS Duration 98    EKG Q-T Interval 382    EKG QTC Calculation 424    EKG Calculated P Axis 60    EKG Calculated R Axis 15    EKG Calculated T Axis 64    QTC Fredericia 410    Narrative    Normal sinus rhythm with sinus arrhythmia  Nonspecific T wave abnormality  Abnormal ECG  When compared with ECG of 13-Jul-2021 15:29,  No significant change was found  Confirmed by Helane Rima ([id number]) on 07/13/2021 4:55:54 PM             Portions of this note were dictated using dragon dictation software.  Grammatical and other errors were sought out but there may still be errors that were not identified and corrected.       Jeryl [place], [place]  07/13/21 1916

## 2021-07-13 NOTE — Unmapped (Addendum)
Came by EMS   Chest pain today.   Increased pain with movement.  Received Aspirin 324 mg prior to arrival.   12 lead NSR.   BP 115/63  HR 70  100%ra

## 2021-07-13 NOTE — Unmapped (Signed)
Provider in Triage (PIT) Note - Medical Screening Examination     Subjective:  Timothy Tapia is a 56 y.o. male presenting to the ED with a complaint of sharp chest pain onset this morning radiating into his left shoulder. His pain is somewhat worse with moving the left arm. At present he also endorses some fatigue. He denies recent fever or cough. EMS gave 324mg  aspirin prior to arrival.     Objective:  BP 119/79  - Pulse 68  - Temp 37.7 ??C (99.9 ??F) (Oral)  - Resp 20  - Ht 165.1 cm (5' 5)  - Wt 73.9 kg (163 lb)  - SpO2 96%  - BMI 27.12 kg/m??      Focused Physical Exam:  Constitutional: No acute distress  Respiratory: Non-labored respirations  Neurological: Moves all extremities equally, clear speech  ?  Assessment/Plan:  Cardiac workup. Flu/RSV/COVID swab.  Patient with HIV, on chart review patient recently had a detectable viral load.   ?  Initial encounter with patient in triage completed, triage protocols implemented at provider???s discretion. A full ED report and evaluation with disposition will be completed by a healthcare provider when an ED bed or ED location is available. Patient is aware that this is an initial encounter and verbalizes understanding of plan.    Timothy Tapia  Timothy Tapia  July 13, 2021 10:50 AM  ___    Scribe Statement: Documentation assistance was provided by me personally, Timothy Tapia, a scribe. Timothy Breslow, PA obtained and performed the history, physical exam and medical decision making elements that were entered into the chart. Signed by Timothy Tapia, 07/13/21 at 10:50 AM    Provider Attestation: Documentation assistance was provided by the Scribe, Timothy Tapia.  I was present during the time the encounter was recorded. The information recorded by the Scribe was done at my direction and has been reviewed and validated by me.

## 2021-07-23 NOTE — Unmapped (Signed)
Medical Case Management Discharge Note    Duration of Intervention: 15 minutes    Talked with Patient? YES    TYPE OF CONTACT: Face to Face - In Person    REASON FOR DISCHARGE:  Other: SW leaving clinic. Pt self sufficient and can access SW as needed.     EFFECTIVE DATE:  07/23/21    OUTSIDE REFERRALS MADE: Yes, describe: gave information for [facility] dental clinic as pt wants to move back to [facility] and when he does he needs a partial. Does not want to wait a couple months on the [facility] waitlist. Gave information for our front desk to ask for social work int he future and to ask for Judithann Sheen, our dietician, for new referral for food pantry delivery program.          Bradly Bienenstock, LCSW, CHES  [facility] Lead Social Worker

## 2021-07-24 MED ORDER — CETIRIZINE 10 MG TABLET
ORAL_TABLET | 11 refills | 0 days | Status: CP
Start: 2021-07-24 — End: ?

## 2021-07-25 MED ORDER — VENTOLIN HFA 90 MCG/ACTUATION AEROSOL INHALER
11 refills | 0.00000 days | Status: CP
Start: 2021-07-25 — End: ?

## 2021-07-31 ENCOUNTER — Ambulatory Visit: Admit: 2021-07-31 | Discharge: 2021-07-31 | Payer: MEDICARE

## 2021-07-31 DIAGNOSIS — F1010 Alcohol abuse, uncomplicated: Principal | ICD-10-CM

## 2021-07-31 DIAGNOSIS — F1011 Alcohol abuse, in remission: Principal | ICD-10-CM

## 2021-07-31 LAB — TOXICOLOGY SCREEN, URINE
AMPHETAMINE SCREEN URINE: NEGATIVE
BARBITURATE SCREEN URINE: NEGATIVE
BENZODIAZEPINE SCREEN, URINE: NEGATIVE
BUPRENORPHINE, URINE SCREEN: NEGATIVE
CANNABINOID SCREEN URINE: NEGATIVE
COCAINE(METAB.)SCREEN, URINE: NEGATIVE
FENTANYL SCREEN, URINE: NEGATIVE
METHADONE SCREEN, URINE: NEGATIVE
OPIATE SCREEN URINE: NEGATIVE
OXYCODONE SCREEN URINE: NEGATIVE

## 2021-08-01 NOTE — Unmapped (Signed)
[facility] Refill Coordination Note    Specialty Medication(s) to be Shipped:   Inflammatory Disorders: Dupixent    Other medication(s) to be shipped: No additional medications requested for fill at this time     Timothy Tapia, DOB: 10-Mar-1966  Phone: [phone] (home) [phone] (work)      All above HIPAA information was verified with patient.     Was a Nurse, learning disability used for this call? No    Completed refill call assessment today to schedule patient's medication shipment from the [facility] [phone]).  All relevant notes have been reviewed.     Specialty medication(s) and dose(s) confirmed: Regimen is correct and unchanged.   Changes to medications: Timothy Tapia reports no changes at this time.  Changes to insurance: No  New side effects reported not previously addressed with a pharmacist or physician: None reported  Questions for the pharmacist: No    Confirmed patient received a Conservation officer, historic buildings and a Surveyor, mining with first shipment. The patient will receive a drug information handout for each medication shipped and additional FDA Medication Guides as required.       DISEASE/MEDICATION-SPECIFIC INFORMATION        For patients on injectable medications: Patient currently has 2 doses left.  Next injection is scheduled for 08/01/21.    SPECIALTY MEDICATION ADHERENCE     Medication Adherence    Patient reported X missed doses in the last month: 0  Specialty Medication: Dupixent  Patient is on additional specialty medications: No              Were doses missed due to medication being on hold? No    Dupixent 300/2 mg/ml: 7 days of medicine on hand        REFERRAL TO PHARMACIST     Referral to the pharmacist: Not needed      [facility]     Shipping address confirmed in Epic.     Delivery Scheduled: Yes, Expected medication delivery date: 08/08/21.     Medication will be delivered via Same Day Courier to the prescription address in Epic WAM.    Unk Lightning   [facility] Specialty Technician

## 2021-08-01 NOTE — Unmapped (Signed)
RD called pt to screen for food insecurity based on pt personal need. Pt reports he now lives in [place], [place] at the [facility] and interested in a food delivery program due to lack of transportation. RD will refer pt to [facility] [employer] [facility] food pantry program.    Food Insecurity:    I'm going to read you two statements that people have made about their food situation. For each statement, please tell me whether the statement was often true, sometimes true, or never true for your household in the last month.      1. ???We worried whether our food would run out before we got money to buy more.???   Often True     2. ???The food that we bought just didn't last, and we didn't have money to get more.  Never True      SNAP benefits: yes  SNAP $ per month: $95   Food pantry: yes    Interventions used/offered: RD will apply pt to a food pantry program.    Barriers to Care: transportation    Time of Intervention: 10 minutes    Unk Pinto, MS, RD, LDN

## 2021-08-06 NOTE — Unmapped (Signed)
Referral Services Note     Duration of Intervention: 15 minutes    TYPE OF CONTACT: Phone    ASSESSMENT: Pt called SW to reschedule appt with Dr. Servando Snare due to constant pain. Pt stated that he could hardly move his arm.     INTERVENTION:  SW reached out to [employer] [facility] to reschedule pt appt to 08/13/2021    PLAN:  Pt will attend appt, and will inform SW of any barriers to care.        Prescious Hurless, MSW  [place] [facility] Social Work

## 2021-08-08 MED FILL — DUPIXENT 300 MG/2 ML SUBCUTANEOUS PEN INJECTOR: SUBCUTANEOUS | 28 days supply | Qty: 8 | Fill #3

## 2021-08-10 NOTE — Unmapped (Signed)
RD called pt back per patient request. Pt states he has yet to receive food from [employer] [facility] food pantry. RD re-submitted application and Engineer, maintenance (IT) to verify fax.     Time of Intervention: 10 minutes    Unk Pinto, MS, RD, LDN

## 2021-08-13 ENCOUNTER — Ambulatory Visit
Admit: 2021-08-13 | Discharge: 2021-08-14 | Payer: MEDICARE | Attending: Infectious Disease | Primary: Infectious Disease

## 2021-08-13 DIAGNOSIS — K860 Alcohol-induced chronic pancreatitis: Principal | ICD-10-CM

## 2021-08-13 DIAGNOSIS — M542 Cervicalgia: Principal | ICD-10-CM

## 2021-08-13 DIAGNOSIS — B20 Human immunodeficiency virus [HIV] disease: Principal | ICD-10-CM

## 2021-08-13 LAB — CBC W/ AUTO DIFF
BASOPHILS ABSOLUTE COUNT: 0 10*9/L (ref 0.0–0.1)
BASOPHILS RELATIVE PERCENT: 0.7 %
EOSINOPHILS ABSOLUTE COUNT: 0.1 10*9/L (ref 0.0–0.5)
EOSINOPHILS RELATIVE PERCENT: 1.3 %
HEMATOCRIT: 39.9 % (ref 39.0–48.0)
HEMOGLOBIN: 13.3 g/dL (ref 12.9–16.5)
LYMPHOCYTES ABSOLUTE COUNT: 1.4 10*9/L (ref 1.1–3.6)
LYMPHOCYTES RELATIVE PERCENT: 23.2 %
MEAN CORPUSCULAR HEMOGLOBIN CONC: 33.3 g/dL (ref 32.0–36.0)
MEAN CORPUSCULAR HEMOGLOBIN: 29.1 pg (ref 25.9–32.4)
MEAN CORPUSCULAR VOLUME: 87.7 fL (ref 77.6–95.7)
MEAN PLATELET VOLUME: 8.4 fL (ref 6.8–10.7)
MONOCYTES ABSOLUTE COUNT: 0.5 10*9/L (ref 0.3–0.8)
MONOCYTES RELATIVE PERCENT: 7.9 %
NEUTROPHILS ABSOLUTE COUNT: 4 10*9/L (ref 1.8–7.8)
NEUTROPHILS RELATIVE PERCENT: 66.9 %
PLATELET COUNT: 379 10*9/L (ref 150–450)
RED BLOOD CELL COUNT: 4.55 10*12/L (ref 4.26–5.60)
RED CELL DISTRIBUTION WIDTH: 16.3 % — ABNORMAL HIGH (ref 12.2–15.2)
WBC ADJUSTED: 5.9 10*9/L (ref 3.6–11.2)

## 2021-08-13 LAB — LYMPH MARKER LIMITED,FLOW
ABSOLUTE CD3 CNT: 994 {cells}/uL (ref 915–3400)
ABSOLUTE CD4 CNT: 616 {cells}/uL (ref 510–2320)
ABSOLUTE CD8 CNT: 364 {cells}/uL (ref 180–1520)
CD3% (T CELLS): 71 % (ref 61–86)
CD4% (T HELPER): 44 % (ref 34–58)
CD4:CD8 RATIO: 1.7 (ref 0.9–4.8)
CD8% T SUPPRESR: 26 % (ref 12–38)

## 2021-08-13 LAB — HEMOGLOBIN A1C
ESTIMATED AVERAGE GLUCOSE: 140 mg/dL
HEMOGLOBIN A1C: 6.5 % — ABNORMAL HIGH (ref 4.8–5.6)

## 2021-08-13 MED ORDER — GABAPENTIN 300 MG CAPSULE
ORAL_CAPSULE | Freq: Two times a day (BID) | ORAL | 6 refills | 45 days | Status: CP
Start: 2021-08-13 — End: ?

## 2021-08-13 NOTE — Unmapped (Signed)
PCP:  Lynnea Ferrier, MD    08/13/2021    This is a RETURN visit for this 56 y.o. male with the following diagnoses:    Patient Active Problem List    Diagnosis Date Noted   ??? Human immunodeficiency virus (HIV) disease (CMS-HCC) 02/04/1995   ??? Chronic bilateral low back pain 11/09/2019   ??? COPD (chronic obstructive pulmonary disease) (CMS-HCC) 05/27/2017   ??? Hypokalemia 05/27/2017   ??? GERD (gastroesophageal reflux disease) 05/27/2017   ??? Pancreatic pseudocyst 07/06/2015   ??? GI bleeding 07/06/2015   ??? ETOH abuse 07/06/2015   ??? DVT (deep venous thrombosis), IMV thrombus 10/07/13 provoked per hematology (67mo therapy) 10/25/2013   ??? Colitis 10/12/2013   ??? Pancreatitis 02/15/2013   ??? Tobacco use disorder 02/15/2013   ??? HTN (hypertension) 02/15/2013   ??? Barrett esophagus 02/15/2013   ??? Asthma 02/15/2013   ??? 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 CD4 is [phone]  ?? Had persistently undetectable HIV RNA save for occasional blip but now with low level viremia of ~50. Swears he never misses a dose.   ?? On Prezcobix + Dolutegravir   ? 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 Tolerates current regimen.  ? Booster not desirable esp given injected steroids  ? Will see what repeat viral load is. Consider increase in dolutegravir dose if still with low level viremia.    Substance Use  ?? 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 Was at  [facility]   ?? Says he is still sober x 18 minths  ?? Has daily calls with rehab center in [place]    Back and neck pain  ?? DJD on MRI  ?? Worsening  ?? MRI Sept 20, 2022   IMPRESSION: Interval progression of degenerative changes, causing severe neural foraminal narrowing at L5-S1 on the right at L5-S1 and moderate neural foraminal narrowing at L5-S1 on the left and L4-L5 bilaterally.  ?? [facility] seen [facility]   ?? Intraarticular corticosteroids had helped but no longer.  ?? Now with neck and right shoulder pain.   ?? Gabapentin 900 mg at bedtime is helping he says. Will try to escalate to 900 mg BID slowly. Watch for daytime somnolence.   ?? Check MRI cervical spine  ?? May need Neurosurgery consult  ?? See [facility] in 4 weeks    Hx Pancreatitis - Now quiescent   ?? Old MRI shown that prior pancreatic pseudocyst resolved  ?? On Creon  ?? A1C was down last visit to 6.5%  ?? Denies polydipsia/phagia or frequent urination  ??  Barrett's Espohagitis  ?? On PPI  ?? Has some dysphagia  ?? Will ask GI to see and consider repeat EGD given Barretts and symptoms  ??  Smoking   ?? Smoking has increased as he has been dealing with addiction issues  ??  Mental Health  ?? See above  ?? On Remeron 30 mg at bedtime   ?? Watch for excessive somnolence given this + gabapentin.  ??  Health Maintenance   ?? Immunizations:  ?? HBV immune. HCV antibody positive but HCV viral load negative so not infected.   ?? C19 fully vaccinated and boosted.   ?? Refuses flu shot.   ?? Cancer Screening:   ?? Colonoscopy in 2017. Multiple polyps. Repeat.   ?? Barrett's so EGDs  ??  HTN: Controlled today. Will watch. On ACEi plus Ca++ bloocker.   ?? Derm: Dermatology seeing for Atopic like dermatitis. On Dupixent.     Will follow up in 10 months.       Chief Complaint: Routine HIV follow-up    HPI:  Mr. Timothy Tapia returns for a routine appointment. See detailed A/P.     HIV-wise: Mostly stable CD4 and HIV RNA. Reports good adherence to ART.      ROS:   No fever chills, sweats, nausea, vomiting, diarrhea, rash, headache, joint pain. Neg for epigastric pain. Pos for LBP, dysphagia    All other systems are negative.    ALLERGIES:  Sulfa (sulfonamide antibiotics) and Sulfur    MEDICATIONS:  Current Outpatient Medications   Medication Sig Dispense Refill   ??? amLODIPine (NORVASC) 5 MG tablet Take 1 tablet (5 mg total) by mouth daily. 30 tablet 3   ??? betamethasone, augmented, (DIPROLENE) 0.05 % ointment Apply twice daily to active areas until smooth 150 g 5   ??? cetirizine (ZYRTEC) 10 MG tablet TAKE ONE TABLET BY MOUTH EVERY DAY 30 tablet 11   ??? CREON 24,000-76,000 -120,000 unit CpDR delayed release capsule TAKE 1 CAPSULE BY MOUTH THREE TIMES DAILY WITH A MEAL 90 capsule 5   ??? darunavir-cobicistat (PREZCOBIX) 800-150 mg-mg tablet Take 1 tablet by mouth daily. 30 tablet 6   ??? dolutegravir (TIVICAY) 50 mg TABLET Take 1 tablet (50 mg total) by mouth daily. 30 tablet 6   ??? dupilumab 300 mg/2 mL PnIj Inject the contents of 1 pen (300 mg) under the skin once a week. 8 mL 6   ??? empty container Misc Use as directed to dispose of Dupixent pens. 1 each 2   ??? enalapril (VASOTEC) 20 MG tablet TAKE ONE TABLET BY MOUTH EVERY DAY 90 tablet 2   ??? halobetasol (ULTRAVATE) 0.05 % ointment APPLY TO AFFECTED AREA TWICE A DAY AS NEEDED AVOID FACE AND FOLDS 30 g 1   ??? halobetasol (ULTRAVATE) 0.05 % ointment Apply topically Two (2) times a day. To active areas until smooth 100 g 4   ??? hydrOXYzine (ATARAX) 25 MG tablet Take 1 tablet (25 mg total) by mouth at bedtime. Prn pruritus 30 tablet 0   ??? pantoprazole (PROTONIX) 20 MG tablet Take 1 tablet (20 mg total) by mouth daily. 90 tablet 3   ??? VENTOLIN HFA 90 mcg/actuation inhaler INHALE 2 PUFFS UP TO FOUR TIMES DAILY AS NEEDED 18 g 11   ??? gabapentin (NEURONTIN) 300 MG capsule Take 3 capsules (900 mg total) by mouth two (2) times a day. TAKE ONE CAPSULE BY MOUTH 3 TIMES A DAY 270 capsule 6   ??? ivermectin (STROMECTOL) 3 mg Tab Take 5 tablets by mouth once today and then take another 5 tablets by mouth 1 week later (Patient not taking: Reported on 08/13/2021) 10 tablet 0   ??? methylPREDNISolone (MEDROL DOSEPACK) 4 mg tablet follow package directions (Patient not taking: Reported on 08/13/2021) 1 each 0   ??? mirtazapine (REMERON) 30 MG tablet Take 1 tablet (30 mg total) by mouth daily. (Patient not taking: Reported on 08/13/2021) 30 tablet 6   ??? permethrin (ELIMITE) 5 % cream Apply to entire body for 8 to 14 hours then rinse off, then repeat 1 week later (Patient not taking: Reported on 08/13/2021) 60 g 0   ??? triamcinolone (KENALOG) 0.1 % ointment Apply to itchy spots 2 times daily. For elbows at night time, apply medicine and then cover  with saran wrap. (Patient not taking: Reported on 08/13/2021) 454 g 0     No current facility-administered medications for this visit.     PE:  A&A. NAD. Chest CTA, Heart RRR. EXT w/o edema    DATA:    Results for orders placed or performed in visit on 07/31/21   Toxicology Screen, Urine   Result Value Ref Range    Amphetamines Screen, Ur Negative <500 ng/mL    Barbiturates Screen, Ur Negative <200 ng/mL    Benzodiazepines Screen, Urine Negative <200 ng/mL    Cannabinoids Screen, Ur Negative <20 ng/mL    Methadone Screen, Urine Negative <300 ng/mL    Cocaine(Metab.)Screen, Urine Negative <150 ng/mL    Opiates Screen, Ur Negative <300 ng/mL    Fentanyl Screen, Ur Negative <1.0 ng/mL    Oxycodone Screen, Ur Negative <100 ng/mL    Buprenorphine, Urine Negative <5 ng/mL     *Note: Due to a large number of results and/or encounters for the requested time period, some results have not been displayed. A complete set of results can be found in Results Review.        Creatinine   Date Value Ref Range Status   07/13/2021 0.80 0.60 - 1.10 mg/dL Final   16/04/9603 5.40 0.60 - 1.10 mg/dL Final   98/05/9146 8.29 0.60 - 1.10 mg/dL Final   56/21/3086 5.78 0.70 - 1.30 mg/dL Final   46/96/2952 8.41 0.76 - 1.27 mg/dL Final   32/44/0102 7.25 0.70 - 1.30 mg/dL Final   36/64/4034 7.42 0.70 - 1.30 mg/dL Final   59/56/3875 6.43 0.70 - 1.30 mg/dL Final       Triglycerides   Date Value Ref Range Status   06/18/2021 163 (H) 0 - 150 mg/dL Final   32/95/1884 166 1 - 149 mg/dL Final     HDL   Date Value Ref Range Status   06/18/2021 42 40 - 60 mg/dL Final   12/29/1599 79 (H) 40 - 59 mg/dL Final     LDL Calculated   Date Value Ref Range Status   06/18/2021 115 (H) 40 - 99 mg/dL Final     Comment:     NHLBI Recommended Ranges, LDL Cholesterol, for Adults (20+yrs) (ATPIII), mg/dL  Optimal              <093  Near Optimal        100-129  Borderline High     130-159  High                160-189  Very High            >=190  NHLBI Recommended Ranges, LDL Cholesterol, for Children (2-19 yrs), mg/dL  Desirable            <235  Borderline High     110-129  High                 >=130       LDL Cholesterol, Calculated   Date Value Ref Range Status   02/15/2013 100 mg/dL Final     Comment:     :  ADULTS (20 years or older)  Optimal         <100  Near Optimal    100-129  Borderline High 130-159  High            160-189  Very High       >=190  CHILDREN (2-19 years)  Desirable       <110  Borderline High 110-129  High            >/= 130       IMAGING STUDIES:  None

## 2021-08-13 NOTE — Unmapped (Signed)
PROMIS Tablet Screening  Completed Date: 08/13/2021     SW reviewed self-administered screening.    Patient had a PHQ-9 score of 4  indicating None-Minimal depression.   Pt denies SI.   Pt scored 0 on AUDIT/AUDIT-C indicating Not-at-risk alcohol consumption  Pt  denies substance use in past 3 months.  Pt denies concerns for IPV.    Pt seen by provider for regular ID visit.  Pt was not seen in person by Social Work during this visit.     Sofie Rower, LCSW-A  [facility] Social Work

## 2021-08-14 LAB — HIV RNA, QUANTITATIVE, PCR
HIV RNA LOG(10): 1.72 {Log_copies}/mL — ABNORMAL HIGH (ref <0.00)
HIV RNA QNT RSLT: DETECTED — AB
HIV RNA: 52 {copies}/mL — ABNORMAL HIGH (ref <0)

## 2021-08-16 NOTE — Unmapped (Addendum)
-----   Message from Franki Cabot sent at 08/13/2021  3:03 PM EST -----  Incoming Call  Caller: Duwayne Heck from [facility]  Best callback number: [phone]  Reason for call: Needs clarification on the medicine that was sent to them today.  Medication is Gabapentin300mg     RN Follow Up    Pharmacy needs clarification of Rx. RN will forward message to provider.

## 2021-08-17 NOTE — Unmapped (Signed)
Called [facility] after [name] called the [facility] stating his Amlodipine was being denied.   [facility] stated they did not have a current prescription. Current pharmacy with that prescription is [facility].     [name] is on his way here to pick up prescription and asked that the prescription be sent to [facility] on Monday.

## 2021-08-17 NOTE — Unmapped (Signed)
Timothy Tapia called in to clarify dose of Gabapentin as Dr.Wohl increased his dose on his last visit. Dose is 900 mg by mouth twice a day.   Timothy Tapia stated he had 300 mg capsules - so he will take three capsules twice a day.

## 2021-08-22 MED ORDER — GABAPENTIN 300 MG CAPSULE
ORAL_CAPSULE | Freq: Two times a day (BID) | ORAL | 6 refills | 45.00000 days | Status: CP
Start: 2021-08-22 — End: ?

## 2021-08-23 NOTE — Unmapped (Signed)
Complex Case Management Pre-Assessment Note  Pre-Assessment  NOTE      Summary:  Care Coordinator spoke with patient and verified correct patient using two identifiers today for enrollment in Complex Case Management. Informed patient of Complex Case Management services. Pt has agreed to participate in the Complex Case Management program. Care Coordinator contact information was provided to patient. Care Coordinator scheduled initial assessment with Case Manager for 08/27/21 at 10 AM     General Case management Questions:     General Care Management - Patient Level    Assessment completed with: patient[LT1.1]  Patient lives with:  (Comment: [facility])[LT1.2]  Type of residence: group home (Comment: [facility])[LT1.2]  DME used at home:  (Comment: not at this time)[LT1.2]  Transportation means: Comptroller, public transportation (Comment: bus, ez rider)[LT1.2]  Does your health interfere with activities of daily living?:  (Comment: not at this time)[LT1.2]  Exercise: yes[LT1.1]  Times per week: 6[LT1.1]  Type of exercise: walking[LT1.2]  Follow special diet?: regular[LT1.1]  Interested in seeing dietician?: No (Comment: not at this time)[LT1.2]  Experiencing side effects from current medications: No (Comment: not at this time)[LT1.2]  Interested in seeing pharmacist?: No (Comment: not at this time)[LT1.2]  Difficulty keeping appointments: No (Comment: not at this time)[LT1.2]  Need assistance with community resources?: Yes (Comment: food pantries, delivery?)[LT1.2]  Other significant issues impacting care?: Back pain that is worse when sitting down[LT1.2]     Attribution     LT1.1 Timothy Tapia 08/23/21 11:36    LT1.2 Timothy Tapia 08/23/21 12:57           History Review:     Past Medical History:   Diagnosis Date   ??? Acute pancreatitis    ??? Barrett esophagus    ??? Black stool 07/08/2017   ??? Bronchitis    ??? Colon polyp    ??? Eczema    ??? Gastroesophageal reflux disease    ??? HIV disease (CMS-HCC)    ??? Hypertension ??? Lack of access to transportation    ??? Pancreatic pseudocyst    ??? 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 Visual impairment      Caregiver burden No   Cognitive Impairment No   Falls Risk No   Financial difficulty No   Frail Elderly No   Hearing impairment/loss No   Homeless No   Impaired mobility No   Inadequate social/family support No   Ineffective family coping No   Low Literacy No   Nonadherence to medication No   Non-english speaking No   Terminal Illness/Hospice No   Transportation barriers Yes   Visual impairment Yes     Past Surgical History:   Procedure Laterality Date   ??? esophogus surgery     ??? PR COLONOSCOPY W/BIOPSY SINGLE/MULTIPLE  10/11/2013    Procedure: COLONOSCOPY, FLEXIBLE, PROXIMAL TO SPLENIC FLEXURE; WITH BIOPSY, SINGLE OR MULTIPLE;  Surgeon: Teodoro Spray, MD;  [facility];  Service: Gastroenterology   ??? PR COLONOSCOPY W/BIOPSY SINGLE/MULTIPLE N/A 09/29/2014    Procedure: COLONOSCOPY, FLEXIBLE, PROXIMAL TO SPLENIC FLEXURE; WITH BIOPSY, SINGLE OR MULTIPLE;  Surgeon: Bronson Curb, MD;  Location: [facility];  Service: Gastroenterology   ??? PR COLONOSCOPY W/BIOPSY SINGLE/MULTIPLE N/A 09/19/2015    Procedure: COLONOSCOPY, FLEXIBLE, PROXIMAL TO SPLENIC FLEXURE; WITH BIOPSY, SINGLE OR MULTIPLE;  Surgeon: Mayford Knife, MD;  [facility]: [facility];  Service: Gastroenterology   ??? PR COLSC FLX W/RMVL  OF TUMOR POLYP LESION SNARE TQ N/A 09/19/2015    Procedure: COLONOSCOPY FLEX; W/REMOV TUMOR/LES BY SNARE;  Surgeon: Mayford Knife, MD;  Location: [facility]  Service: Gastroenterology   ??? PR ENDOSCOPIC US EXAM, ESOPH N/A 08/10/2015    Procedure: UGI ENDOSCOPY; WITH ENDOSCOPIC ULTRASOUND EXAMINATION LIMITED TO THE ESOPHAGUS;  Surgeon: Vonda Antigua, MD;  Location: [facility];  Service: Gastroenterology   ??? PR UPGI ENDOSCOPY,FN NEEDLE BX,GUIDED N/A 09/22/2014    Procedure: UGI W/TRANSENDOSCOPIC US-GUIDE INTRA/TRANSMURAL NEEDLE ASP/BX (INCL EXAM ESOPHAGUS, STOMACH, DOUDENUM/JEJ);  Surgeon: Ned Grace, MD;  [facility];  Service: Gastroenterology   ??? PR UPPER GI ENDOSCOPY,BIOPSY N/A 09/19/2015    Procedure: UGI ENDOSCOPY; WITH BIOPSY, SINGLE OR MULTIPLE;  Surgeon: Mayford Knife, MD;  [facility]: [facility];  Service: Gastroenterology   ??? PR UPPER GI ENDOSCOPY,BIOPSY N/A 07/14/2017    Procedure: UGI ENDOSCOPY; WITH BIOPSY, SINGLE OR MULTIPLE;  Surgeon: Alfred Levins, MD;  Location: [facility];  Service: Gastroenterology     Family History   Problem Relation Age of Onset   ??? Hypertension Mother    ??? Diabetes Mother    ??? COPD Father    ??? Pancreatic cancer Neg Hx    ??? Pancreatitis Neg Hx    ??? Melanoma Neg Hx    ??? Basal cell carcinoma Neg Hx    ??? Squamous cell carcinoma Neg Hx      Ready to quit: Not Answered  Counseling given: Not Answered  Tobacco comments: 1 pack lasts 4 days; referred to [facility]uit[facility]; Taking nicorette gum    Ready to quit: Not Answered  Counseling given: Not Answered  Tobacco comments: 1 pack lasts 4 days; referred to [facility] Quit[facility]; Taking nicorette gum                     Social History     Substance and Sexual Activity   Drug Use Not Currently   ??? Types: Marijuana    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     Social History     Substance and Sexual Activity   Sexual Activity Yes   ??? Partners: Female       Self-Efficacy Score:  SCORE: 10 (08/23/2021 11:36 AM)      Medications:   Outpatient Encounter Medications as of 08/23/2021   Medication Sig Dispense Refill   ??? amLODIPine (NORVASC) 5 MG tablet Take 1 tablet (5 mg total) by mouth daily. 30 tablet 3   ??? betamethasone, augmented, (DIPROLENE) 0.05 % ointment Apply twice daily to active areas until smooth 150 g 5   ??? cetirizine (ZYRTEC) 10 MG tablet TAKE ONE TABLET BY MOUTH EVERY DAY 30 tablet 11   ??? CREON 24,000-76,000 -120,000 unit CpDR delayed release capsule TAKE 1 CAPSULE BY MOUTH THREE TIMES DAILY WITH A MEAL 90 capsule 5   ??? darunavir-cobicistat (PREZCOBIX) 800-150 mg-mg tablet Take 1 tablet by mouth daily. 30 tablet 6   ??? dolutegravir (TIVICAY) 50 mg TABLET Take 1 tablet (50 mg total) by mouth daily. 30 tablet 6   ??? dupilumab 300 mg/2 mL PnIj Inject the contents of 1 pen (300 mg) under the skin once a week. 8 mL 6   ??? empty container Misc Use as directed to dispose of Dupixent pens. 1 each 2   ??? enalapril (VASOTEC) 20 MG tablet TAKE ONE TABLET BY  MOUTH EVERY DAY 90 tablet 2   ??? gabapentin (NEURONTIN) 300 MG capsule Take 3 capsules (900 mg total) by mouth two (2) times a day. 270 capsule 6   ??? halobetasol (ULTRAVATE) 0.05 % ointment APPLY TO AFFECTED AREA TWICE A DAY AS NEEDED AVOID FACE AND FOLDS 30 g 1   ??? halobetasol (ULTRAVATE) 0.05 % ointment Apply topically Two (2) times a day. To active areas until smooth 100 g 4   ??? hydrOXYzine (ATARAX) 25 MG tablet Take 1 tablet (25 mg total) by mouth at bedtime. Prn pruritus 30 tablet 0   ??? ivermectin (STROMECTOL) 3 mg Tab Take 5 tablets by mouth once today and then take another 5 tablets by mouth 1 week later (Patient not taking: Reported on 08/13/2021) 10 tablet 0   ??? methylPREDNISolone (MEDROL DOSEPACK) 4 mg tablet follow package directions (Patient not taking: Reported on 08/13/2021) 1 each 0   ??? mirtazapine (REMERON) 30 MG tablet Take 1 tablet (30 mg total) by mouth daily. (Patient not taking: Reported on 08/13/2021) 30 tablet 6   ??? pantoprazole (PROTONIX) 20 MG tablet Take 1 tablet (20 mg total) by mouth daily. 90 tablet 3   ??? permethrin (ELIMITE) 5 % cream Apply to entire body for 8 to 14 hours then rinse off, then repeat 1 week later (Patient not taking: Reported on 08/13/2021) 60 g 0   ??? triamcinolone (KENALOG) 0.1 % ointment Apply to itchy spots 2 times daily. For elbows at night time, apply medicine and then cover with saran wrap. (Patient not taking: Reported on 08/13/2021) 454 g 0   ??? VENTOLIN HFA 90 mcg/actuation inhaler INHALE 2 PUFFS UP TO FOUR TIMES DAILY AS NEEDED 18 g 11     No facility-administered encounter medications on file as of 08/23/2021.        Social History Review:     Programmer, applications: Low Risk    ??? Difficulty of Paying Living Expenses: Not hard at all      Food Insecurity: Food Insecurity Present   ??? Worried About Programme researcher, broadcasting/film/video in the Last Year: Sometimes true   ??? Ran Out of Food in the Last Year: Sometimes true      Transportation Needs: No Transportation Needs   ??? Lack of Transportation (Medical): No   ??? Lack of Transportation (Non-Medical): No      Physical Activity: Sufficiently Active   ??? Days of Exercise per Week: 5 days   ??? Minutes of Exercise per Session: 30 min      Stress: No Stress Concern Present   ??? Feeling of Stress : Not at all      Intimate Partner Violence: Not At Risk   ??? Fear of Current or Ex-Partner: No   ??? Emotionally Abused: No   ??? Physically Abused: No   ??? Sexually Abused: No      Alcohol Use: Not At Risk   ??? How often do you have a drink containing alcohol?: Never   ??? How many drinks containing alcohol do you have on a typical day when you are drinking?: 1 - 2   ??? How often do you have 5 or more drinks on one occasion?: Never      Tobacco Use: High Risk   ??? Smoking Tobacco Use: Every Day   ??? Smokeless Tobacco Use: Never   ??? Passive Exposure: Not on file      Depression: Not on file        Upcoming  Appointment (s):   Future Appointments   Date Time Provider Department Center   08/30/2021  7:00 AM [facility] 6 [facility] [place]   09/10/2021 11:00 AM Floria Raveling [place], DO [facility] [place]   09/12/2021  1:15 PM Arletha Grippe, PA [facility] [place]   11/16/2021  8:40 AM Hunt Oris, MD [facility] [place]         This patient is currently under review for Complex Case Management services. For progress, care plan changes, updates or recent discharges please contact CM.         Timothy Tapia

## 2021-08-27 DIAGNOSIS — Z5941 Food insecurity: Principal | ICD-10-CM

## 2021-08-27 DIAGNOSIS — E119 Type 2 diabetes mellitus without complications: Principal | ICD-10-CM

## 2021-08-27 NOTE — Unmapped (Signed)
Donnald Garre wants to see patient Wednesday March 1st at 1:00

## 2021-08-28 NOTE — Unmapped (Signed)
Complex Case Management  Assessment  NOTE  08/27/2021      Summary:  [facility]ocate Case Manager spoke with patient and verified correct patient using two identifiers today for enrollment in Complex Case Management. Informed patient of Complex Case Management services. Pt has agreed to participate in the Complex Case Management program. Case Manager contact information was provided to patient.     General Case management Questions:   General Care Management - Patient Level    Assessment completed with: patient[LT1.1]  Patient lives with:  (Comment: [facility])[LT1.2]  Type of residence: group home (Comment: [facility])[LT1.2]  DME used at home:  (Comment: not at this time)[LT1.2]  Transportation means: Comptroller, public transportation (Comment: bus, ez rider)[LT1.2]  Does your health interfere with activities of daily living?:  (Comment: not at this time)[LT1.2]  Exercise: yes[LT1.1]  Times per week: 6[LT1.1]  Type of exercise: walking[LT1.2]  Follow special diet?: regular[LT1.1]  Interested in seeing dietician?: No (Comment: not at this time)[LT1.2]  Experiencing side effects from current medications: No (Comment: not at this time)[LT1.2]  Interested in seeing pharmacist?: No (Comment: not at this time)[LT1.2]  Difficulty keeping appointments: No (Comment: not at this time)[LT1.2]  Need assistance with community resources?: Yes (Comment: food pantries, delivery?)[LT1.2]  Other significant issues impacting care?: Back pain that is worse when sitting down[LT1.2]     Attribution     LT1.1 Timothy Tapia 08/23/21 11:36    LT1.2 Timothy Tapia 08/23/21 12:57           Primary Health Concerns:  What is your/your child's primary health concern?: housing for section 8, blood sugar,    Assessment:  Completed By: Patient    Self-Health:  How would you describe you or your child's current physical health?: Very Good  How would you describe you or your child's current mental health?: Very Good     What concerns would cause you to go to the ED (Emergency Department) rather than your PCP/other provider? : having a heart attack     Oral Health - How would you describe you or your child's oral health?: Good (regular dental visits)  Have you had a dental or oral exam in the last year?: Yes    ACS Disability Status:   Are you deaf or do you have serious difficulty hearing?: No  Do you wear hearing aids?: No  Do you use any assistive devices to communicate?: No  Summary of Vision, Driving, Reading/Writing, and Disability Status : Patient denies vision concerns, denies issues with reading/writing, unsure if on SSDI, does not drive and uses EZ rider    Self Efficacy Assessment:  How confident are you that you can keep the fatigue caused by your disease from interfering with the things you want to do?: 10 (performed by [facility] during pre-assessment)  How confident are you that you can keep the physical discomfort or pain of your disease from interfering with the things you want to do?: 10  How confident are you that you can keep the emotional distress caused by your disease from interfering with the things you want to do?: 10  How confident are you that you can keep any other symptoms or health problems you have from interfering with the things you want to do?: 10  How confident are you that you can do the different tasks and activities needed to manage your health condition so as to reduce you need to see a doctor?: 10  How confident are you that you can do things  other than just taking medication to reduce how much you illness affects your everyday life?: 10  SCORE: 10    Childhood Experiences:  Did you have any exposure to Adverse Childhood experiences or child trauma?: No    Pediatric ACE:                                     Support Summary:  Summary of Caregiver/Social Support: Patient does not have caregiver, does not need one. social support is friends and family    ADL/IADL:  Dressing - Ability to perform: Independent  Grooming - Ability to perform: Independent  Feeding - Ability to perform: Independent  Bathing - Ability to perform: Independent  Toileting - Ability to perform: Independent (going to hve back surgery, getting up can be difficult if sitting for prolonged period)  Transfering- Ability to perform: Independent  Continence: Futures trader - Do you have a problem with any of the following:: Independent    Vision:  Patient's Vision Adequate to Safely Complete Daily Activities: Yes    Driving Concerns:  Do you drive?: No   Mode of Transportation: Other (Comment) (ez rider)    Reading/Writing:  Are you able to read?: Yes   Are you able to write?: Yes  Oriented to person, place, time, situation: Oriented x 4    Patient/Family Memory Concerns:  Do you have concerns about your memory?: No   Does anyone in your family have concerns about your memory?: No    Learning and Disability:  History of attendance at a Special Education program or setting?: No   Intellectual Disability (formally Mental Retardation)?: No  Learning Disability?: No    CM Assessment:  Describe the patient's cognitive status:: 1  Summary of Memory and Cognitive Status: Patient is alert and oriented x4    Barriers to Care:  What are the patient's barriers? (Select all that apply): [employer], Substance Misuse, Health Literacy, Other (Comment)    ACP Review:  ACP Reviewed: Yes    Chronic Pain:  Do you/ your child have chronic pain?: (!) Yes (in his back)    Community Resources:   Which Services?: Public Transit, Food Pantries    Dietary Needs:   Do you/your child have any special dietary needs or restrictions?: No    Home Health:  Any Home Health?: None    Insurance Benefits:  What is your current understanding of your insurance benefits?: Good    Culture and Beliefs:  Is there anything I should know about your culture, beliefs, or religious practices that would help me take better care of you?: No    Life Planing Summary:  Summary of Advanced Care and Life Planning: Patient does not want ACP, does have medicaid but does not need long term services at this time, no upcoming life transitions    CM Summary:  Patient needs/concerns addressed today:: will discuss pain management, blood glucose, dental resources,  Care Plan items covered today:: care plan created  Care plan items planned for next conversation:: Rd appointments, dental      History Review:   Past Medical History:   Diagnosis Date   ??? Acute pancreatitis    ??? Barrett esophagus    ??? Black stool 07/08/2017   ??? Bronchitis    ??? Colon polyp    ??? Eczema    ??? Gastroesophageal reflux disease    ??? HIV disease (CMS-HCC)    ???  Hypertension    ??? Lack of access to transportation    ??? Pancreatic pseudocyst    ??? 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 Visual impairment      Caregiver burden No   Cognitive Impairment No   Falls Risk No   Financial difficulty No   Frail Elderly No   Hearing impairment/loss No   Homeless No   Impaired mobility No   Inadequate social/family support No   Ineffective family coping No   Low Literacy No   Nonadherence to medication No   Non-english speaking No   Terminal Illness/Hospice No   Transportation barriers Yes   Visual impairment Yes     Past Surgical History:   Procedure Laterality Date   ??? esophogus surgery     ??? PR COLONOSCOPY W/BIOPSY SINGLE/MULTIPLE  10/11/2013    Procedure: COLONOSCOPY, FLEXIBLE, PROXIMAL TO SPLENIC FLEXURE; WITH BIOPSY, SINGLE OR MULTIPLE;  Surgeon: Teodoro Spray, MD;  [facility];  Service: Gastroenterology   ??? PR COLONOSCOPY W/BIOPSY SINGLE/MULTIPLE N/A 09/29/2014    Procedure: COLONOSCOPY, FLEXIBLE, PROXIMAL TO SPLENIC FLEXURE; WITH BIOPSY, SINGLE OR MULTIPLE;  Surgeon: Bronson Curb, MD;  Location: [facility];  Service: Gastroenterology   ??? PR COLONOSCOPY W/BIOPSY SINGLE/MULTIPLE N/A 09/19/2015    Procedure: COLONOSCOPY, FLEXIBLE, PROXIMAL TO SPLENIC FLEXURE; WITH BIOPSY, SINGLE OR MULTIPLE; Surgeon: Mayford Knife, MD;  [facility]: [facility];  Service: Gastroenterology   ??? PR COLSC FLX W/RMVL OF TUMOR POLYP LESION SNARE TQ N/A 09/19/2015    Procedure: COLONOSCOPY FLEX; W/REMOV TUMOR/LES BY SNARE;  Surgeon: Mayford Knife, MD;  [facility]: [facility];  Service: Gastroenterology   ??? PR ENDOSCOPIC US EXAM, ESOPH N/A 08/10/2015    Procedure: UGI ENDOSCOPY; WITH ENDOSCOPIC ULTRASOUND EXAMINATION LIMITED TO THE ESOPHAGUS;  Surgeon: Vonda Antigua, MD;  [facility]: [facility];  Service: Gastroenterology   ??? PR UPGI ENDOSCOPY,FN NEEDLE BX,GUIDED N/A 09/22/2014    Procedure: UGI W/TRANSENDOSCOPIC US-GUIDE INTRA/TRANSMURAL NEEDLE ASP/BX (INCL EXAM ESOPHAGUS, STOMACH, DOUDENUM/JEJ);  Surgeon: Ned Grace, MD;  [facility]: [facility];  Service: Gastroenterology   ??? PR UPPER GI ENDOSCOPY,BIOPSY N/A 09/19/2015    Procedure: UGI ENDOSCOPY; WITH BIOPSY, SINGLE OR MULTIPLE;  Surgeon: Mayford Knife, MD;  [facility];  Service: Gastroenterology   ??? PR UPPER GI ENDOSCOPY,BIOPSY N/A 07/14/2017    Procedure: UGI ENDOSCOPY; WITH BIOPSY, SINGLE OR MULTIPLE;  Surgeon: Alfred Levins, MD;  Location: [facility];  Service: Gastroenterology     Family History   Problem Relation Age of Onset   ??? Hypertension Mother    ??? Diabetes Mother    ??? COPD Father    ??? Pancreatic cancer Neg Hx    ??? Pancreatitis Neg Hx    ??? Melanoma Neg Hx    ??? Basal cell carcinoma Neg Hx    ??? Squamous cell carcinoma Neg Hx      Ready to quit: Not Answered  Counseling given: Not Answered  Tobacco comments: 1 pack lasts 4 days; referred to [facility] Quitline; Taking nicorette gum    Ready to quit: Not Answered  Counseling given: Not Answered  Tobacco comments: 1 pack lasts 4 days; referred to [facility] Quitline; Taking nicorette gum                     Social History  Substance and Sexual Activity   Drug Use Not Currently   ??? 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 amLODIPine (NORVASC) 5 MG tablet Take 1 tablet (5 mg total) by mouth daily. 30 tablet 3   ??? cetirizine (ZYRTEC) 10 MG tablet TAKE ONE TABLET BY MOUTH EVERY DAY 30 tablet 11   ??? CREON 24,000-76,000 -120,000 unit CpDR delayed release capsule TAKE 1 CAPSULE BY MOUTH THREE TIMES DAILY WITH A MEAL 90 capsule 5   ??? darunavir-cobicistat (PREZCOBIX) 800-150 mg-mg tablet Take 1 tablet by mouth daily. 30 tablet 6   ??? dolutegravir (TIVICAY) 50 mg TABLET Take 1 tablet (50 mg total) by mouth daily. 30 tablet 6   ??? dupilumab 300 mg/2 mL PnIj Inject the contents of 1 pen (300 mg) under the skin once a week. 8 mL 6   ??? enalapril (VASOTEC) 20 MG tablet TAKE ONE TABLET BY MOUTH EVERY DAY 90 tablet 2   ??? gabapentin (NEURONTIN) 300 MG capsule Take 3 capsules (900 mg total) by mouth two (2) times a day. 270 capsule 6   ??? pantoprazole (PROTONIX) 20 MG tablet Take 1 tablet (20 mg total) by mouth daily. 90 tablet 3   ??? VENTOLIN HFA 90 mcg/actuation inhaler INHALE 2 PUFFS UP TO FOUR TIMES DAILY AS NEEDED 18 g 11   ??? betamethasone, augmented, (DIPROLENE) 0.05 % ointment Apply twice daily to active areas until smooth 150 g 5   ??? empty container Misc Use as directed to dispose of Dupixent pens. 1 each 2   ??? halobetasol (ULTRAVATE) 0.05 % ointment APPLY TO AFFECTED AREA TWICE A DAY AS NEEDED AVOID FACE AND FOLDS 30 g 1   ??? halobetasol (ULTRAVATE) 0.05 % ointment Apply topically Two (2) times a day. To active areas until smooth 100 g 4   ??? hydrOXYzine (ATARAX) 25 MG tablet Take 1 tablet (25 mg total) by mouth at bedtime. Prn pruritus (Patient not taking: Reported on 08/27/2021) 30 tablet 0   ??? ivermectin (STROMECTOL) 3 mg Tab Take 5 tablets by mouth once today and then take another 5 tablets by mouth 1 week later (Patient not taking: Reported on 08/13/2021) 10 tablet 0   ??? methylPREDNISolone (MEDROL DOSEPACK) 4 mg tablet follow package directions (Patient not taking: Reported on 08/13/2021) 1 each 0   ??? mirtazapine (REMERON) 30 MG tablet Take 1 tablet (30 mg total) by mouth daily. (Patient not taking: Reported on 08/13/2021) 30 tablet 6   ??? permethrin (ELIMITE) 5 % cream Apply to entire body for 8 to 14 hours then rinse off, then repeat 1 week later (Patient not taking: Reported on 08/13/2021) 60 g 0   ??? triamcinolone (KENALOG) 0.1 % ointment Apply to itchy spots 2 times daily. For elbows at night time, apply medicine and then cover with saran wrap. (Patient not taking: Reported on 08/13/2021) 454 g 0     No facility-administered encounter medications on file as of 08/27/2021.        Social History Review:   Programmer, applications: Low Risk    ??? Difficulty of Paying Living Expenses: Not hard at all      Food Insecurity: Food Insecurity Present   ??? Worried About Programme researcher, broadcasting/film/video in the Last Year: Sometimes true   ??? Ran Out of  Food in the Last Year: Sometimes true      Transportation Needs: No Transportation Needs   ??? Lack of Transportation (Medical): No   ??? Lack of Transportation (Non-Medical): No      Physical Activity: Sufficiently Active   ??? Days of Exercise per Week: 5 days   ??? Minutes of Exercise per Session: 30 min      Stress: No Stress Concern Present   ??? Feeling of Stress : Not at all      Intimate Partner Violence: Not At Risk   ??? Fear of Current or Ex-Partner: No   ??? Emotionally Abused: No   ??? Physically Abused: No   ??? Sexually Abused: No      Alcohol Use: Not At Risk   ??? How often do you have a drink containing alcohol?: Never   ??? How many drinks containing alcohol do you have on a typical day when you are drinking?: 1 - 2   ??? How often do you have 5 or more drinks on one occasion?: Never      Tobacco Use: High Risk   ??? Smoking Tobacco Use: Every Day   ??? Smokeless Tobacco Use: Never   ??? Passive Exposure: Not on file      Depression: Not on file        PHQ-2 Score:      PHQ-9 Score: Emergency Back-up Plan   Does the patient currently have an emergency back-up plan in the event of a power outage or natural disaster? yes   If no, I would like to assist you to identify a written plan to account for short and long-term needs in the event of an emergency situation, natural disaster, power outage or equipment failure. This plan will also include identification of individuals in which you are able to contact to assist in an event of an emergency including emergency response system, provider, friends, family or alternate care provider.  Patients's Back-up Plan: Pt has access to non-perishable food, bottled water and flashlights.     Care Plan:   Care plan shared with patient and provider: Yes    Patient Rights and Responsibilities:  Patient Rights and Responsibilities reviewed and provided with enclosed Welcome Letter via Care Plan : Yes    Upcoming Appointment (s):   Future Appointments   Date Time Provider Department Center   08/29/2021 12:30 PM Unk Pinto [facility]   08/30/2021  7:00 AM [facility] MRI RM 6 [facility] [place]   09/10/2021 11:00 AM Redge Gainer, DO [place] [facility]   09/12/2021  1:15 PM Arletha Grippe, PA [facility]   11/16/2021  8:40 AM Hunt Oris, MD [facility]       This patient is currently receiving Complex Case Management services.      Primary Case Manager:  Mikolaj Woolstenhulme   [phone]  Please contact CM for care plan changes, updates or recent discharges.    High Risk Drivers: Multiple Complex Diagnoses  Primary Disease Process: COPD HTN Chronic Pain  Current Residence:  [facility]  Primary Medical Home: Lynnea Ferrier, MD???s office    Current services: medicare advantage, medicaid direct  Patient's Primary Concern is/goals are: Maintain Health  Barriers: Housing Costs/Unstable Housing, Substance Misuse, and Health Literacy  Strengths: Family connection  Supports: Siblings  Interventions provided: Contact Information Provided, Introduction to ICM, Medication Reconciliation, Advanced Care Planning Education, and Supportive Listening   Follow up with ICM Team Member: 2 weeks  4 weeks,            Nakina Spatz, [place]

## 2021-08-29 ENCOUNTER — Ambulatory Visit: Admit: 2021-08-29 | Discharge: 2021-08-30 | Payer: MEDICARE | Attending: Registered" | Primary: Registered"

## 2021-08-29 DIAGNOSIS — R7303 Prediabetes: Principal | ICD-10-CM

## 2021-08-29 DIAGNOSIS — Z5941 Food insecurity: Principal | ICD-10-CM

## 2021-08-29 DIAGNOSIS — E119 Type 2 diabetes mellitus without complications: Principal | ICD-10-CM

## 2021-08-29 NOTE — Unmapped (Signed)
[facility]  Adult Medical Nutrition Therapy - Initial Assessment/Care Plan  Date: 08/29/2021    Patient Name:             Timothy Tapia    Date of Birth / Age / Birth Gender:            1966/05/23 / 56 y.o. / Male    Referring MD or Clinic:             Minerva Fester, MD      Interpreter:            No      Reason for Referral:   General, healthful diet order yes: weight management, weight reduction and diabetes      Nutritional Concerns/Comments:  56 y.o. male seeking nutritional counseling for weight management, weight reduction and diabetes.    Pt reports to initial nutrition visit with BMI of 26.09 kg/m2 (overweight) with a history of COPD, GERD, ETOH abuse, DVT, HTN, barrett esophagus seeking nutritional counseling to mitigate elevated glucose levels.      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 Pt reports his excessive sugar intake is typically in the PM.     At today's visit, RD and pt discussed:  -meal regularity  -increasing calories in AM to decrease calories in PM  -macronutrient pairing  -the plate method  -lean protein sources  -high protein/fiber snacks  -added sugars     DYSGLYCEMIA: Seems this elevation is attributed to undesirable food choices (added sugars, processed carbohydrates, sugar-sweetened beverages), sedentary lifestyle and nutrition-knowledge deficit.   Component Ref Range & Units 08/13/21 1002 12/11/20 1109 06/12/20 1129    Hemoglobin A1C 4.8 - 5.6 % 6.5??High??  5.9??High??  6.5??High??     Estimated Average Glucose mg/dL 161  096  045    Resulting Agency  [facility]       DIET: For breakfast, pt will have fruit. For lunch, pt will have a salad with bacon bits, cheese, salad dressing. For dinner, pt will have steak, mashed potatoes and salad. PM snacks include cookies, chips, crackers, chocolate.    PHYSICAL ACTIVITY: None reported.    FOOD INSECURITY: Pt screens positive. Pt receives SNAP benefits ($200)    Anthropometric Data:   Height:  163.8 cm (5' 4.5)   Weight:  70 kg (154 lb 6.4 oz)   BMI:  26.1                Weight History:               Wt Readings from Last 6 Encounters:   08/29/21 70 kg (154 lb 6.4 oz)   08/13/21 71.6 kg (157 lb 12.8 oz)   07/13/21 73.9 kg (163 lb)   06/18/21 73.8 kg (162 lb 12.8 oz)   06/02/21 72.8 kg (160 lb 8 oz)   05/10/21 72.6 kg (160 lb)       Usual body weight: 160 lbs  Ideal Body Weight: 60.41 kg  Weight in (lb) to have BMI = 25: 147.6  Goal weight: 133 lbs      Physical Findings Data:               BP Today: 120/80                 BP History:  BP Readings from Last 6 Encounters:   08/29/21 120/80   08/13/21 122/73   07/13/21 126/76   06/18/21 134/76   06/02/21 144/85   04/23/21 134/80       Biochemical Data:              A1C:    HGB A1C, POC   Date Value Ref Range Status   03/31/2020 8.7 (H) <7.0 % Final     Comment:     A1c Glycemic Goal: <7.0%     **Goals should be individualized; more or less stringent A1c glycemic goals may be appropriate for individual patients.      (Adopted from: 2020 ADA Standards of Medical Care In Diabetes)  Point of Care A1c testing is not FDA-approved for the diagnosis of Diabetes.     Hemoglobin A1C   Date Value Ref Range Status   08/13/2021 6.5 (H) 4.8 - 5.6 % Final                 Estimated Blood Glucose:   Lab Results   Component Value Date    Estimated Average Glucose 140 08/13/2021    EST AVERAGE GLUCOSE, POC 203 03/31/2020                Total Cholesterol:   Cholesterol   Date Value Ref Range Status   06/18/2021 190 <=200 mg/dL Final     Cholesterol, Total   Date Value Ref Range Status   02/15/2013 208 (H) 100 - 199 mg/dL Final                Trig:   Triglycerides   Date Value Ref Range Status   06/18/2021 163 (H) 0 - 150 mg/dL Final   16/04/9603 540 1 - 149 mg/dL Final                 HDL:   HDL   Date Value Ref Range Status   06/18/2021 42 40 - 60 mg/dL Final   98/05/9146 79 (H) 40 - 59 mg/dL Final                  LDL:   LDL Calculated   Date Value Ref Range Status   06/18/2021 115 (H) 40 - 99 mg/dL Final     Comment:     NHLBI Recommended Ranges, LDL Cholesterol, for Adults (20+yrs) (ATPIII), mg/dL  Optimal              <829  Near Optimal        100-129  Borderline High     130-159  High                160-189  Very High            >=190  NHLBI Recommended Ranges, LDL Cholesterol, for Children (2-19 yrs), mg/dL  Desirable            <562  Borderline High     110-129  High                 >=130       LDL Cholesterol, Calculated   Date Value Ref Range Status   02/15/2013 100 mg/dL Final     Comment:     :  ADULTS (20 years or older)  Optimal         <100  Near Optimal    100-129  Borderline High 130-159  High  160-189  Very High       >=190  CHILDREN (2-19 years)  Desirable       <110  Borderline High 110-129  High            >/= 130       Current Medications, Herbs, Supplements:    Current Outpatient Medications:   ???  amLODIPine (NORVASC) 5 MG tablet, Take 1 tablet (5 mg total) by mouth daily., Disp: 30 tablet, Rfl: 3  ???  betamethasone, augmented, (DIPROLENE) 0.05 % ointment, Apply twice daily to active areas until smooth, Disp: 150 g, Rfl: 5  ???  cetirizine (ZYRTEC) 10 MG tablet, TAKE ONE TABLET BY MOUTH EVERY DAY, Disp: 30 tablet, Rfl: 11  ???  CREON 24,000-76,000 -120,000 unit CpDR delayed release capsule, TAKE 1 CAPSULE BY MOUTH THREE TIMES DAILY WITH A MEAL, Disp: 90 capsule, Rfl: 5  ???  darunavir-cobicistat (PREZCOBIX) 800-150 mg-mg tablet, Take 1 tablet by mouth daily., Disp: 30 tablet, Rfl: 6  ???  dolutegravir (TIVICAY) 50 mg TABLET, Take 1 tablet (50 mg total) by mouth daily., Disp: 30 tablet, Rfl: 6  ???  dupilumab 300 mg/2 mL PnIj, Inject the contents of 1 pen (300 mg) under the skin once a week., Disp: 8 mL, Rfl: 6  ???  empty container Misc, Use as directed to dispose of Dupixent pens., Disp: 1 each, Rfl: 2  ???  enalapril (VASOTEC) 20 MG tablet, TAKE ONE TABLET BY MOUTH EVERY DAY, Disp: 90 tablet, Rfl: 2  ???  gabapentin (NEURONTIN) 300 MG capsule, Take 3 capsules (900 mg total) by mouth two (2) times a day., Disp: 270 capsule, Rfl: 6  ???  halobetasol (ULTRAVATE) 0.05 % ointment, APPLY TO AFFECTED AREA TWICE A DAY AS NEEDED AVOID FACE AND FOLDS, Disp: 30 g, Rfl: 1  ???  halobetasol (ULTRAVATE) 0.05 % ointment, Apply topically Two (2) times a day. To active areas until smooth, Disp: 100 g, Rfl: 4  ???  hydrOXYzine (ATARAX) 25 MG tablet, Take 1 tablet (25 mg total) by mouth at bedtime. Prn pruritus, Disp: 30 tablet, Rfl: 0  ???  ivermectin (STROMECTOL) 3 mg Tab, Take 5 tablets by mouth once today and then take another 5 tablets by mouth 1 week later, Disp: 10 tablet, Rfl: 0  ???  methylPREDNISolone (MEDROL DOSEPACK) 4 mg tablet, follow package directions, Disp: 1 each, Rfl: 0  ???  mirtazapine (REMERON) 30 MG tablet, Take 1 tablet (30 mg total) by mouth daily., Disp: 30 tablet, Rfl: 6  ???  pantoprazole (PROTONIX) 20 MG tablet, Take 1 tablet (20 mg total) by mouth daily., Disp: 90 tablet, Rfl: 3  ???  permethrin (ELIMITE) 5 % cream, Apply to entire body for 8 to 14 hours then rinse off, then repeat 1 week later, Disp: 60 g, Rfl: 0  ???  triamcinolone (KENALOG) 0.1 % ointment, Apply to itchy spots 2 times daily. For elbows at night time, apply medicine and then cover with saran wrap., Disp: 454 g, Rfl: 0  ???  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 For each statement, please tell me whether the statement was often true, sometimes true, or never true for your household in the last month.     1. ???We worried whether our food would run out before we got money to buy more.???   Sometimes True     2. ???The food that we bought just didn't last, and we didn't have money to get more.  Sometimes True       Usual Daily Food Choices:      24-Hour Recall/Usual Intake:  Time Intake   Breakfast orange   Snack (AM)    Lunch    Snack (PM) Potato chips   Dinner  bbq pork chops, mashed potatoes and salad   Snack (HS) Shortbread cookies     Food and Nutrient Intake:  Snacks:  Chips, cookies, chips  Beverages:  Tea, sparking water  Dining Out:  n/a  Cooking Methods: bbq, grilling, baking  Usual Food Choices: fruit, cookies, chips, ice cream   Meal Schedule:  irregular    Behavioral Factors:  Overeating: Consumes sugar-sweetened beverages in large portions.   Emotional Eating: No issues noted.   Grazing: Denied issues.   Fast Eating: Denied issues.   Nighttime Eating: Nighttime overeating.    Factors Affecting Food Intake:  Knowledge and Beliefs: He presents with food and nutrition-related knowledge deficit. He has not previously met with dietitian or participated in nutrition program.   Stress/Anxiety: Did not report issues with stress/anxiety.   Sleep Patterns: Did not report issues with sleeping habits and patterns.  Food Safety and Access: He noted limited finances and resources.  He has SNAP/EBT benefits.   Other: n/a        Food Intolerances/Dietary Restrictions:  No known food allergies or food intolerances.     Other GI Issues:  Heartburn    Hunger and Satiety:  Denied issues.       Allergies:  is allergic to sulfa (sulfonamide antibiotics) and sulfur.      Usual Daily Physical Activity Factors: >/=1.0 to <1.4 (Sedentary)  Method for estimating physical activity factor: 2015-2020 DRI Guidelines    Estimated Energy Needs:    1900 kcal Resting Metabolic Rate by Mifflin-St Jeor equation  -250 kcal Calorie difference for weight goal  1650 kcal Total energy estimated need for weight goal    Protein:70-84 g/kg [1.0-1.2 g/kg]  Carbohydrate: 40-65%  Fat: total fat: 20-35% of kcal, saturated fat: <10% of kcal , trans fat: as low as possible  Fiber: 30-35g   Sodium:  <2400 mg   Cholesterol: <200 mg  Added Sugars: < 35 g (AHA recommendations)  Fluid: 1650-1900 ml/kcal {66ml/kcal]    Estimated Adherence:  Client self-reported adherence score 8  Method for estimating adherence: Adult Meducation Readiness Ruler      NUTRITION DIAGNOSIS:   Inadequate  energy intake   Inadequate  intake of grains  Inadequate  intake of fruits  Inadequate  intake of vegetables  Inadequate  intake of milk/milk products  Inadequate  intake of meat, poultry, fish, eggs, beans, nut products    Method for estimating nutritional adequacy: 2015-2020 DRI Guidelines     Clinical Diagnosis:   Excessive Carbohydrate Intake as  related to nutrition-knowledge deficit, cravings for sugary foods as evidenced by most recent hbA1c of 6.4% and self-reported high intake of processed carbohydrates.            NUTRITION INTERVENTIONS/PLAN OF CARE    Nutrition Counseling     Nutrition Education     Realistic weight goal setting: 0.5 to up to 2 lbs per week.                                                   Comprehensive weight management program includes a reduced calorie diet, increased fruit/vegetable/whole grain consumption and increased physical activity by 30 minutes, 5x per week.    Nutrient adequacy during weight loss provided for in individualized meal plan that includes client???s preferences: energy deficit of 250-500 kcal for a total caloric consumption of 1650 kcal/day.    Behavior therapy strategies of self-monitoring food intake, physical activity and goal setting.    Coordination of care via Mail and Medical Record    Client agreed to follow interventions:  Yes Nutrition-Related Medication Management: Nutrition-related complimentary/alternative medicine      Nutrition Goals:  1. Add/include protein source at meal times--> chicken, fish, [place], beans, eggs, cottage cheese, nuts and nut butters  2. Moving calories to AM/lunch to reduce cravings at night  3. Balanced meals/the plate method:  4. Healthier snack ideas (protein/fiber-rich)  -protein bars (Pure Protein OR [facility] Protein)- no more than 5g added sugar with at least 10g protein  -mixed nuts/peanuts  -various fruits with peanut butter  5. Incorporate more whole-grains into daily intake  -whole-wheat bread  -brown rice  -whole-grain cereals  6. Use the plate method:  -1/2 plate non-starchy vegetables  -1/4 plate lean proteins  -1/4 plate whole-grain carbohydrates  7. Consistent meal schedule-try to avoid skipping meals/eating every 3/4 hours.      Patient Education:  Individualized nutrition counseling and education provided on:   Macronutrient Pairing  The Plate Method  Protein Sources  Fiber Intake  Meal Schedule    Materials Provided:  Patient instructions: nutrition-related recommendations   Handouts supporting dietary recommendations: Added Sugars, Grocery Shopping  Additional Materials:   Radiographer, therapeutic , Online resources         NUTRITION MONITORING AND EVALUATION:  Follow-up appointment: telephone/video in 2 weeks (March 15th at 11am)      Goals for next appointment:   1. Add/include protein source at meal times--> chicken, fish, [place], beans, eggs, cottage cheese, nuts and nut butters  2. Moving calories to AM/lunch to reduce cravings at night  3. Balanced meals/the plate method:  4. Healthier snack ideas (protein/fiber-rich)  -protein bars (Pure Protein OR [facility] Protein)- no more than 5g added sugar with at least 10g protein  -mixed nuts/peanuts  -various fruits with peanut butter  5. Incorporate more whole-grains into daily intake  -whole-wheat bread  -brown rice  -whole-grain cereals  6. Use the plate method:  -1/2 plate non-starchy vegetables  -1/4 plate lean proteins  -1/4 plate whole-grain carbohydrates  7. Consistent meal schedule-try to avoid skipping meals/eating every 3/4 hours.                                       Number of preferred RD visits per  dx: 6-12 visits    Plan of care time frame: 6 months    Length of visit was: 35 minutes        **Pt has Medicare as insurance. It does not meet criteria for billing services for this nutritional service. Therefore it will be covered under the [employer] Program**      Signed:  Unk Pinto  08/29/2021 12:02 PM

## 2021-08-29 NOTE — Unmapped (Signed)
Nutrition Goals:  1. Add/include protein source at meal times--> chicken, fish, [place], beans, eggs, cottage cheese, nuts and nut butters  2. Moving calories to AM/lunch to reduce cravings at night  3. Balanced meals/the plate method:  4. Healthier snack ideas (protein/fiber-rich)  -protein bars (Pure Protein OR [facility] Protein)- no more than 5g added sugar with at least 10g protein  -mixed nuts/peanuts  -various fruits with peanut butter  5. Incorporate more whole-grains into daily intake  -whole-wheat bread  -brown rice  -whole-grain cereals  6. Use the plate method:  -1/2 plate non-starchy vegetables  -1/4 plate lean proteins  -1/4 plate whole-grain carbohydrates  7. Consistent meal schedule-try to avoid skipping meals/eating every 3/4 hours.

## 2021-08-30 ENCOUNTER — Ambulatory Visit: Admit: 2021-08-30 | Discharge: 2021-08-31 | Payer: MEDICARE

## 2021-08-30 MED ADMIN — gadobenate dimeglumine (MULTIHANCE) 529 mg/mL (0.1mmol/0.2mL) solution 14 mL: 14 mL | INTRAVENOUS | @ 13:00:00 | Stop: 2021-08-30

## 2021-08-31 NOTE — Unmapped (Signed)
[facility] Refill Coordination Note    Specialty Medication(s) to be Shipped:   Inflammatory Disorders: Dupixent    Other medication(s) to be shipped: No additional medications requested for fill at this time     Timothy Tapia, DOB: December 14, 1965  Phone: [phone] (home) [phone] (work)      All above HIPAA information was verified with patient.     Was a Nurse, learning disability used for this call? No    Completed refill call assessment today to schedule patient's medication shipment from the [facility] [phone]).  All relevant notes have been reviewed.     Specialty medication(s) and dose(s) confirmed: Regimen is correct and unchanged.   Changes to medications: Jamont reports no changes at this time.  Changes to insurance: No  New side effects reported not previously addressed with a pharmacist or physician: None reported  Questions for the pharmacist: No    Confirmed patient received a Conservation officer, historic buildings and a Surveyor, mining with first shipment. The patient will receive a drug information handout for each medication shipped and additional FDA Medication Guides as required.       DISEASE/MEDICATION-SPECIFIC INFORMATION        For patients on injectable medications: Patient currently has 1 doses left.  Next injection is scheduled for 09/05/2021.    SPECIALTY MEDICATION ADHERENCE     Medication Adherence    Patient reported X missed doses in the last month: 0  Specialty Medication: Dupixent  Patient is on additional specialty medications: No  Any gaps in refill history greater than 2 weeks in the last 3 months: no  Demonstrates understanding of importance of adherence: yes  Informant: patient  Reliability of informant: reliable  Confirmed plan for next specialty medication refill: delivery by pharmacy  Refills needed for supportive medications: not needed              Were doses missed due to medication being on hold? No    Dupixent 300/2 mg/ml: 7 days of medicine on hand REFERRAL TO PHARMACIST     Referral to the pharmacist: Not needed      [facility]     Shipping address confirmed in Epic.     Delivery Scheduled: Yes, Expected medication delivery date: 09/05/2021.     Medication will be delivered via Same Day Courier to the prescription address in Epic WAM.    Deshauna Cayson D Ronie Fleeger   [facility] Specialty Technician

## 2021-09-05 MED FILL — DUPIXENT 300 MG/2 ML SUBCUTANEOUS PEN INJECTOR: SUBCUTANEOUS | 28 days supply | Qty: 8 | Fill #4

## 2021-09-09 DIAGNOSIS — B20 Human immunodeficiency virus [HIV] disease: Principal | ICD-10-CM

## 2021-09-09 MED ORDER — PREZCOBIX 800 MG-150 MG TABLET
ORAL_TABLET | 6 refills | 0 days
Start: 2021-09-09 — End: ?

## 2021-09-09 MED ORDER — TIVICAY 50 MG TABLET
ORAL_TABLET | 6 refills | 0 days
Start: 2021-09-09 — End: ?

## 2021-09-09 MED ORDER — MIRTAZAPINE 30 MG TABLET
ORAL_TABLET | 6 refills | 0 days
Start: 2021-09-09 — End: ?

## 2021-09-10 ENCOUNTER — Ambulatory Visit: Admit: 2021-09-10 | Discharge: 2021-09-11 | Payer: MEDICARE

## 2021-09-10 DIAGNOSIS — G8929 Other chronic pain: Principal | ICD-10-CM

## 2021-09-10 DIAGNOSIS — M542 Cervicalgia: Principal | ICD-10-CM

## 2021-09-10 DIAGNOSIS — M25511 Pain in right shoulder: Principal | ICD-10-CM

## 2021-09-10 MED ORDER — PREZCOBIX 800 MG-150 MG TABLET
ORAL_TABLET | 6 refills | 0 days | Status: CP
Start: 2021-09-10 — End: ?

## 2021-09-10 MED ORDER — TIVICAY 50 MG TABLET
ORAL_TABLET | 6 refills | 0 days | Status: CP
Start: 2021-09-10 — End: ?

## 2021-09-10 MED ORDER — MIRTAZAPINE 30 MG TABLET
ORAL_TABLET | 6 refills | 0 days | Status: CP
Start: 2021-09-10 — End: ?

## 2021-09-10 NOTE — Unmapped (Signed)
[facility] Physical Medicine and Rehabilitation   Follow-Up Note    Patient Name:Timothy Tapia  MRN: [id number]  DOB: 1966-03-16  Age: 56 y.o.   Date: 09/10/2021      ASSESSMENT  Pt is a 56 y.o. year old male withNeck pain, right shoulder pain and lumbar radiculitis   - No improvement with physical therapy and injection based treatments for his low back and leg pain  -Has more recent neck and right shoulder pain      PLAN   1.  We will start him in a physical therapy for his neck and right shoulder pain  2.  Per his request, we will have him speak to our first available surgeon about his low back and leg pain    Diagnoses and all orders for this visit:    Neck pain  -     AMB REFERRAL TO PHYSICAL THERAPY; Future    Chronic right shoulder pain  -     AMB REFERRAL TO PHYSICAL THERAPY; Future      - No follow-ups on file.      SUBJECTIVE  Chief complaint: Back Pain and Leg Pain (Burning and numbess)      History of present Illness:  Pt is a 56 y.o. year old male seen for follow-up regarding continued pain.  Since have last seen him in clinic he continues to have low back and leg pain.  He feels like the epidural injection was not helpful.  He also describes neck pain and right shoulder pain.  He has difficulty raising his right arm.  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  He denies any significant arm weakness and denies any new balance issues..        Medications:has a current medication list which includes the following prescription(s): amlodipine, betamethasone (augmented), cetirizine, creon, prezcobix, tivicay, dupilumab, empty container, enalapril, gabapentin, halobetasol, halobetasol, hydroxyzine, ivermectin, methylprednisolone, mirtazapine, pantoprazole, permethrin, triamcinolone, and ventolin hfa.    Allergies:Sulfa (sulfonamide antibiotics) and Sulfur    Past Medical History:   No interval changes.    Review of Systems: Pertinent items are noted in HPI.       OBJECTIVE   Temp 37.6 ??C (99.6 ??F) (Temporal)  - Ht 162.6 cm (5' 4)  - Wt 69.8 kg (153 lb 12.8 oz)  - BMI 26.40 kg/m??     Physical Exam:    GENERAL: Pleasant male, no acute distress  HEENT: Normocephalic, atraumatic, mucous membranes moist  RESP: Breathing unlabored, dyspneic  MSK: Tenderness to palpation in cervical spine.  Positive painful arc on the right side of the shoulder.  Improved with manual treatment  NEURO: 5-5 strength in upper extremity  PSYCH: Appropriate affect, cooperative     Diagnostic Studies: Reviewed the MRI of the cervical spine with patient today.

## 2021-09-10 NOTE — Unmapped (Signed)
Medication Requested:  Prezcobix, Tivicay,Protonix      Last Office Visit: 08/29/2021  Next Office Visit: 09/14/2021  ??? Per Provider Note: On Prezcobix + Dolutegravir   ? 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 Tolerates current regimen.  ? Booster not desirable esp given injected steroids  Could consider fostesavir + dolutegravir. No change for now.     ??  Barrett's Espohagitis  ??? On PPI  ??? Has some dysphagia  ??? Will ask GI to see and consider repeat EGD given Barretts and symptoms  ???     Standing order protocol requirements met?: No    Sent to: Provider for signing    Days Supply Given: 0  Number of Refills: 0

## 2021-09-13 ENCOUNTER — Ambulatory Visit: Admit: 2021-09-13 | Discharge: 2021-09-14 | Payer: MEDICARE

## 2021-09-13 DIAGNOSIS — Z862 Personal history of diseases of the blood and blood-forming organs and certain disorders involving the immune mechanism: Principal | ICD-10-CM

## 2021-09-13 DIAGNOSIS — M47816 Spondylosis without myelopathy or radiculopathy, lumbar region: Principal | ICD-10-CM

## 2021-09-13 MED ORDER — CHLORHEXIDINE GLUCONATE 4 % TOPICAL LIQUID
Freq: Two times a day (BID) | TOPICAL | 0 refills | 111 days | Status: CP
Start: 2021-09-13 — End: 2021-11-11

## 2021-09-13 MED ORDER — MUPIROCIN 2 % TOPICAL OINTMENT
Freq: Two times a day (BID) | TOPICAL | 0 refills | 3 days | Status: CP
Start: 2021-09-13 — End: 2021-09-16
  Filled 2021-09-17: qty 22, 7d supply, fill #0

## 2021-09-13 NOTE — Unmapped (Signed)
ORTHOPAEDIC SPINE CLINIC NOTE       Timothy Tapia. Timothy Peck, MD  Assistant Professor  Orthopaedic Spine Surgeon  [email or web address]  [phone]         Patient Name:Timothy Tapia  MRN: [id number]  DOB: 1965-08-06  Date: 09/13/2021  PCP: Timothy Ferrier, MD    ASSESSMENT:     Timothy Tapia  56 y.o. male  [phone]  No specialty comments available.     PLAN and RECOMMENDATIONS:          Date: September 13, 2021 9:01 AM    Patient presents for evaluation of low back pain and new onset of leg numbness. After reviewing the images we see severe foraminal stenosis at L5-S1 bilaterally. We expressed that back surgery does not reliably treat back pain. We believe that as he has had significant longlasting relief from prior injections, this nerve could be the main pain generator.   Any potential operation would consist of a fusion. A overview of a potential procedure and realistic outcomes were provided. At this point we believe surgery does have a potential to relieve some of his symptoms up to 60 percent. After an operation we believe he would be out of work for around 3 months.     Patient has expressed he is enthusiastic about a potential operation with an understanding of expected outcomes.     We discussed that indication for surgery here are failure of conservative care which has included failure of injections physical therapy medication management we discussed that a fusion would be necessary given the severity of severe foraminal stenosis and the extent of bone needed for adequate decompression the patient is a candidate for L5-S1 anterior lumbar interbody fusion    The encounter diagnosis was Lumbar spondylosis.    Orders Placed This Encounter   Procedures   ??? XR Lumbar Spine AP, Lateral, Ferguson              SUBJECTIVE:     Chief Complaint:  No chief complaint on file.      History of Present Illness:   Timothy Tapia is a 56 y.o. male seen in consultation at the request of Timothy Tapia* for evaluation of low back pain.    Patient presents for evaluation of lower back pain and new onset of leg numbness. He also complains of mild hip pain. He has reported relief in the past from prior injections.          09/13/21 0835   PainSc: 8    Date of onset: since: Longstanding Leg numbness in last 2 weeks.      Laterality of symptoms: Right > Left (Bilateral) and Axial pain   Extremity: 0%  Frequency: Getting Worse    Modifying factors: worse with standing, worse with activity  Associated symptoms: numbness    Where the pain starts and where it goes to: Unknown    Numbness in LE is present/ absent; if so, intermittently/ constantly?: Yes- Intermittent.     Rest relief?:yes   Positional relief?: yes sitting    Best/Worst position?:   trouble standing up after sitting    Functional limitations include:     Current Treatments Previous Treatments   Current Relevant Pain Medications:  dswmedications: Unknown    Current Physical Therapy:   no    Adjunct Treatments:   Activity Modification    In a Pain Clinic:   no Prior Relevant Pain Medications:  Medication list attached    Prior  Physical Therapy:   None.    Injections:  Yes. Previously have helped for 6-7 months but last injection showed no relief.     Prior Relevant Surgeries:  None.        Medical History   He  has a past medical history of Acute pancreatitis, Barrett esophagus, Black stool (07/08/2017), Bronchitis, Colon polyp, Eczema, Gastroesophageal reflux disease, HIV disease (CMS-HCC), Hypertension, Lack of access to transportation, Pancreatic pseudocyst, Thrombus, and Visual impairment.     Surgical History   He  has a past surgical history that includes esophogus surgery; pr colonoscopy w/biopsy single/multiple (10/11/2013); pr upgi endoscopy,fn needle bx,guided (N/A, 09/22/2014); pr colonoscopy w/biopsy single/multiple (N/A, 09/29/2014); pr endoscopic US exam, esoph (N/A, 08/10/2015); pr upper gi endoscopy,biopsy (N/A, 09/19/2015); pr colonoscopy w/biopsy single/multiple (N/A, 09/19/2015); pr colsc flx w/rmvl of tumor polyp lesion snare tq (N/A, 09/19/2015); and pr upper gi endoscopy,biopsy (N/A, 07/14/2017).     Allergies   Sulfa (sulfonamide antibiotics) and Sulfur   Medications   He has a current medication list which includes the following prescription(s): amlodipine, betamethasone (augmented), cetirizine, creon, dupilumab, empty container, enalapril, gabapentin, halobetasol, halobetasol, mirtazapine, pantoprazole, permethrin, prezcobix, tivicay, ventolin hfa, hydroxyzine, ivermectin, methylprednisolone, and triamcinolone.   Review of Systems A 10-system review was performed by questionnaire and noted in the electronic chart.  Positives noted/discussed.  Balance of systems was negative.    Fever/chills :denies  Bowel/bladder symptoms :denies   Family History His family history includes COPD in his father; Diabetes in his mother; Hypertension in his mother.     Social History He  reports that he has been smoking cigarettes. He started smoking about 28 years ago. He has a 6.75 pack-year smoking history. He has never used smokeless tobacco. He reports that he does not currently use alcohol after a past usage of about 3.0 standard drinks per week. He reports that he does not currently use drugs after having used the following drugs: Marijuana.        Occupational History   ??? Not on file        OBJECTIVE:     PHYSICAL EXAM:  Vitals: Temp (!) 38.7 ??C (101.7 ??F)  - Ht 162.6 cm (5' 4)  - Wt 71.7 kg (158 lb 1.6 oz)  - BMI 27.14 kg/m??   Appearance: well-nourished and no acute distress   Affect: alert and cooperative    Gait: deferred    L-spine Palpation: deferred    Nerve Tension Signs: deferred    Right Hip/Knee:   deferred   deferred  Left Hip/Knee:   deferred   deferred    Skin/Extremities: Normal skin in the lumbar spine. and Normal skin in bilat feet.      Motor R L  Reflexes R L   IP 5 5  Patellar Deferred Deferred   Quad 5 5  Achilles Deferred Deferred Pathologic R L   TA 5 5  Hoffmann's Deferred Deferred   EHL 5 5  Clonus Deferred Deferred   GS 5 5  Babinski Deferred Deferred          MEDICAL DECISION MAKING    XR Lumbar Spine AP, Lateral, Ferguson    Result Date: 09/13/2021  EXAM: XR LUMBAR SPINE AP, LATERAL, FERGUSON DATE: 09/13/2021 8:21 AM ACCESSION: [id number] UN DICTATED: 09/13/2021 8:40 AM INTERPRETATION LOCATION: [facility] CLINICAL INDICATION: 56 years old Male with low back pain  - M47.816 - Lumbar spondylosis  COMPARISON: Lumbar spine MRI dated 03/20/2021 TECHNIQUE: AP, lateral, and Ferguson views of the  lumbar spine. FINDINGS: There are 5 nonrib-bearing lumbar-type vertebral bodies. Negative for spondylolisthesis. Vertebral body heights are preserved. Mild degenerative disc disease of the lower lumbar spine. Mild lower lumbar facet arthropathy. Minimal scattered vascular calcifications.     Degenerative disc and facet disease of the lower lumbar spine.      Test Results:  Imaging:     Lumbar Spine Xrays:   Date:Today.  Impression: Degenerative disc and facet disease of the lower lumbar spine.  Lumbar Spine MRI:   Date:Recent  Impression: Interval progression of degenerative changes, causing severe neural foraminal narrowing at L5-S1 on the right at L5-S1 and moderate neural foraminal narrowing at L5-S1 on the left and L4-L5 bilaterally    MRI Cervical Spine Date: 08/30/2021  Impression: Multilevel cervical spondylosis resulting in moderate spinal canal narrowing at C4-C5 and C5-C6. No abnormal cord signal.  ??  Variable degrees of neural foraminal narrowing, severe bilaterally at C6-C7.    I, Dr. Francesca Jewett, MD, personally interpreted the images. Images were reviewed with the patient on the PACS monitor. The available reports were reviewed.    Labs:  Lab Results   Component Value Date    A1C 6.5 (H) 08/13/2021         Discussion:  ?? Clinical findings, diagnostic/treatment options, and plan were discussed with the patient.  ?? Imaging - Discussed pros/cons of advanced imaging options.  ?? Surgery - Discussed natural history, pros/cons and risks/benefits of surgery, surgical options, and risks/benefits of alternatives.    E&M Coding:    MEDICAL DECISION MAKING (level of service defined by 2/3 elements)     Number/Complexity of Problems Addressed 1 acute complicated injury (99204/99214)   Amount/Complexity of Data to be Reviewed/Analyzed Discussion of management or test interpretation with external physician/other qualified health care professional/appropriate source (99204/99214)   Risk of Complications/Morbidity/Mortality of Management Decision for MAJOR Surgery (90-day global) WITHOUT Risk Factors (99204/99214)     Or TIME     Total Time for E/M Services on the Date of Encounter [phone] - I personally spent 45-59 minutes face-to-face and non-face-to-face in the care of this patient, excluding time spent during separately reported procedures, but including all pre, intra, and post visit time on the date of service.          cc: Timothy Tapia*, Timothy Ferrier, MD    ______________________________________________________________________    Documentation assistance was provided by Ward Givens, Scribe, on 09/13/2021 at 9:40 AM for Dr. Jeanella Flattery, MD     ----------------------------------------------------------------------------------------------------------------------  September 13, 2021 9:40 AM. Documentation assistance provided by the Scribe. I was present during the time the encounter was recorded. The information recorded by the Scribe was done at my direction and has been reviewed and validated by me.  ----------------------------------------------------------------------------------------------------------------------    In depth surgical discussion:    I have explained the pathology and natural history of the diagnosis to the patient.  We have discussed a variety of treatment options including medications, physical therapy and other alternative treatments. I also explained the indications, risks and benefits of surgery. After discussion, the patient decided to proceed with surgery.     I have recommended an anterior and posterior fusion using BMP.     I explained that the risks associated with any spine surgery include, but are not limited to: paralysis, infection, spinal fluid leakage, nerve injury, chronic pain, bleeding, spinal instability, blood clots, other general medical complications or death.  Additional risks included retrograde autoregulation which can lead to permanent  sterility and 2% of cases, damage to the major blood vessels or vasculature, and damage to any internal organs, as well as the risk of a small bowel obstruction or ileus postoperatively.     I also explained that a lumbar surgery has additional risks including injury to vital abdominal organs or vascular structures.     In the case of fusion, there is also the risk of implant malposition or failure and of non-union.     We reviewed the risks and benefits of BMP AND gave the patient a list of resources fully describing them.  The patient understands that BMP has been approved by the FDA for this indication, and accepts its use.     I explained that there can be no guarantee as to the outcome of the surgery with respect to axial or neurologic symptoms including pain, weakness or numbness.  I explained that the current symptoms may not improve, or may even get worse, and new or different symptoms may arise.     All of the patient's questions were answered.    I hereby certify that the nature, purpose, benefits, usual and most frequent risks of, and alternatives to, the operation or procedure have been explained to the patient (or person authorized to sign for the patient) either by a physician or by the provider who is to perform the operation or procedure, that the patient has had an opportunity to ask questions, and that those questions have been answered. The patient or the patient's representative has been advised that selected tasks may be performed by assistants to the primary health care provider(s). I believe that the patient (or person authorized to sign for the patient) understands what has been explained, and has consented to the operation or procedure.

## 2021-09-13 NOTE — Unmapped (Signed)
Your diagnosis: The encounter diagnosis was Lumbar spondylosis.      Recommendations/Plan  Surgery is an option for you, and we think has the potential to help you, if you are interested  If you are interested in surgery, please call the office back, and we can schedule a date  Followup: for preoperative surgery visit (if interested)  Avoid nicotine/tobacco  Maintain a healthy weight    Stay physically active and exercise regularly as tolerated ([email or web address])      Contact our clinic nursing team via MyChart or [phone] with any questions/concerns.  Contact our Spine Surgery Nurse Coordinator at [phone] with surgery-related issues.

## 2021-09-14 ENCOUNTER — Ambulatory Visit
Admit: 2021-09-14 | Discharge: 2021-09-15 | Payer: MEDICARE | Attending: Student in an Organized Health Care Education/Training Program | Primary: Student in an Organized Health Care Education/Training Program

## 2021-09-14 ENCOUNTER — Ambulatory Visit: Admit: 2021-09-14 | Discharge: 2021-09-15 | Payer: MEDICARE

## 2021-09-14 ENCOUNTER — Institutional Professional Consult (permissible substitution): Admit: 2021-09-14 | Discharge: 2021-09-15 | Payer: MEDICARE | Attending: Registered" | Primary: Registered"

## 2021-09-14 DIAGNOSIS — R7303 Prediabetes: Principal | ICD-10-CM

## 2021-09-14 DIAGNOSIS — R051 Acute cough: Principal | ICD-10-CM

## 2021-09-14 DIAGNOSIS — R6889 Other general symptoms and signs: Principal | ICD-10-CM

## 2021-09-14 NOTE — Unmapped (Signed)
You were seen in same day clinic today for cough and congestion.     We discussed this is likely a common cold that your body should fight off on its own. However, we will also get a chest xray today to assess for pneumonia.     Please call us or return to clinic if you have worsening fevers or sinus symptoms longer than 7-10 days.

## 2021-09-14 NOTE — Unmapped (Signed)
Nutrition Goals:  1. Add/include protein source at meal times--> chicken, fish, [place], beans, eggs, cottage cheese, nuts and nut butters  2. Moving calories to AM/lunch to reduce cravings at night  3. Balanced meals/the plate method:  4. Healthier snack ideas (protein/fiber-rich)  -protein bars (Pure Protein OR [facility] Protein)- no more than 5g added sugar with at least 10g protein  -mixed nuts/peanuts  -various fruits with peanut butter  5. Incorporate more whole-grains into daily intake  -whole-wheat bread  -brown rice  -whole-grain cereals  6. Use the plate method:  -1/2 plate non-starchy vegetables  -1/4 plate lean proteins  -1/4 plate whole-grain carbohydrates  7. Consistent meal schedule-try to avoid skipping meals/eating every 3/4 hours.

## 2021-09-14 NOTE — Unmapped (Addendum)
Returned [employer]. Stated he didn't feel well and is ok with being seen by same day clinic. Denies fever, rash. Appt made for 1:30 pm              ----- Message from Franki Cabot sent at 09/14/2021  9:44 AM EDT -----  Incoming Call  Caller: [name] L. Christell Constant  Best callback number: [phone]  Reason for call: Please call patient about this cold that he has

## 2021-09-14 NOTE — Unmapped (Signed)
[facility] SAME DAY CLINIC NOTE     09/14/2021    PCP: Lynnea Ferrier, MD     Assessment and Plan    Timothy Tapia was seen today for uri.    Diagnoses and all orders for this visit:    Flu-like symptoms  -     RAPID INFLUENZA/RSV/COVID PCR    Acute cough  -     XR Chest 2 views; Future    Presenting with 3 days cough, congestion, sinus pressure and rhinorrhea.  Febrile at spine clinic yesterday.  PMH significant for HIV +, with low/undetectable viremia and CD4 count 600s in February 2023.  He is well-appearing with congestion but no localizing lung findings on exam, breathing comfortably.  Suspect viral URI.  COVID/flu/RSV test pending.  Will further assess for indolent pulmonary infection with chest x-ray given HIV status.  Counseled patient to return to clinic or reach out if he has continued fevers or continued sinus symptoms for 10 days, as we would consider antibiotic treatment for ABRS at that time.      Follow up as scheduled or sooner as needed.    Patient was seen and discussed with Dr. Dietrich Pates who is in agreement with the assessment and plan as outlined above.       Subjective    Problem List:  Patient Active Problem List   Diagnosis   ??? Chorioretinal scar   ??? Human immunodeficiency virus (HIV) disease (CMS-HCC)   ??? Pancreatitis   ??? Tobacco use disorder   ??? HTN (hypertension)   ??? Barrett esophagus   ??? Asthma   ??? Colitis   ??? 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 Pancreatic pseudocyst   ??? GI bleeding   ??? ETOH abuse   ??? COPD (chronic obstructive pulmonary disease) (CMS-HCC)   ??? Hypokalemia   ??? GERD (gastroesophageal reflux disease)   ??? Chronic bilateral low back pain       HPI  Timothy Tapia is a 56 y.o. year old male with the above problem list who presents to the same day clinic for   Chief Complaint   Patient presents with   ??? URI     Pt endorses upper respiratory symptoms x 2-3 days . Pt endorses yellow mucous from nose and sputum Monday, noticed a cold after using a new vape for the first time. Only nicotine in vape. Has had runny nose, congestion, sinus pressure since. Has ntoiced some blood tinged rhinorrhea. Coughing frequently and coughing up phlegm. No bloody sputum.  Patient has stopped using his vape.  He is a current smoker, smoking 1 pack every 3 days.  He has a 63-months sober of all drug use.  Not smoking marijuana.    No known copd. Not on inhalers regularly. Uses albuterol as need for bronchitis but has not used it during this illness.  Used Flonase previously but stopped due to epistaxis.    Has tried cough drops, lemon in coffee, onion sandwiches.     Had temperature of 101.7 at visit at the spine clinic yesterday.  Otherwise has not felt chilled or febrile at home. No n/v/d. No other localizing infectious symptoms.     Lives at [employer] [facility]. No one there is sick. Working at [employer].     Relevant PMH includes HTN, EtOH abuse, HIV.  Patient is on Prezcobix + dolutegravir and compliant with ART.  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  Last CD4 count on 08/13/2021 was 616.    Meds and allergies were reviewed in Epic    ROS: 10 point ROS was performed and is otherwise negative other than mentioned in the HPI    Objective  PE:  Vitals:    09/14/21 1318   BP: 138/78   Pulse: 75   Resp: 16   Temp: 36.8 ??C (98.3 ??F)   SpO2: 96%     General: well-appearing in NAD  Eyes: EOMI, sclera clear, PERRL  ENT: OP clear w/o exudate.  Mild posterior pharyngeal erythema.  Tenderness to palpation overlying maxillary sinuses, pain to maxillary sinuses present w/ leaning head forward  CV: regular, no murmurs  Resp: CTAB, no wheezes or crackles, normal WOB  GI: soft, NTND, NABS  MSK: full ROM, no deformity noted  Skin: clean and dry, no rashes or lesions noted  Ext: no cyanosis/clubbing/edema  Neuro: alert, follows commands. CN II-XII grossly intact    Procedure: None  See procedure note from this encounter    _______________________________________________________________________________________      Same Day Metric Tracker:    Same Day Metric Tracker:  Referral source for today's visit:  Infectious Diseases    Referral to other outpatient urgent services? N/A    Did today's visit result in referral to ED or direct admission? No

## 2021-09-14 NOTE — Unmapped (Addendum)
[facility]  Adult Medical Nutrition Therapy - Nutrition Follow-Up Visit  Date: 09/14/2021    Patient Name:             Timothy Tapia    Date of Birth / Age / Birth Gender:            Mar 06, 1966 / 56 y.o. / Male    Referring MD or Clinic:             Minerva Fester, MD      Interpreter:            No      Reason for Referral:   General, healthful diet order yes: weight management, weight reduction and diabetes      Nutritional Concerns/Comments:  56 y.o. male seeking nutritional counseling for weight management, weight reduction and diabetes.    Pt reports to follow-up nutrition visit with BMI of 27.14 kg/m2 (overweight) with a history of COPD, GERD, ETOH abuse, DVT, HTN, barrett esophagus seeking nutritional counseling to mitigate elevated glucose levels.      Since initial nutrition visit, pt reports he made multiple modifications in his dietary intake including the reduction of processed carbohydrates and increase in protein intake.    At today's visit, RD and pt discussed:  -macronutrient pairing  -the plate method  -lean protein sources  -high protein/fiber snacks  -added sugars-reading nutrition facts label     DYSGLYCEMIA: Seems this elevation is attributed to undesirable food choices (added sugars, processed carbohydrates, sugar-sweetened beverages), sedentary lifestyle and nutrition-knowledge deficit.   Component Ref Range & Units 08/13/21 1002 12/11/20 1109 06/12/20 1129    Hemoglobin A1C 4.8 - 5.6 % 6.5??High??  5.9??High??  6.5??High??     Estimated Average Glucose mg/dL 161  096  045    Resulting Agency  [facility]       DIET: For breakfast, pt will have hashbrowns. For lunch, pt will have chicken noodle soup. For dinner, pt will have chicken, rice and vegetables.       PHYSICAL ACTIVITY: Pt walks to and from work daily (30 minutes).    FOOD INSECURITY: Pt screens positive. Pt receives SNAP benefits ($200).    Anthropometric Data:   Height:   5'4   Weight:   158 lbs   BMI: 27.14 kg/m2                Weight History:               Wt Readings from Last 6 Encounters:   09/13/21 71.7 kg (158 lb 1.6 oz)   09/10/21 69.8 kg (153 lb 12.8 oz)   08/29/21 70 kg (154 lb 6.4 oz)   08/13/21 71.6 kg (157 lb 12.8 oz)   07/13/21 73.9 kg (163 lb)   06/18/21 73.8 kg (162 lb 12.8 oz)       Usual body weight: 160 lbs  Ideal Body Weight:  130 lbs  Goal weight: 133 lbs      Physical Findings Data:               BP Today: n/a (telephone visit)                 BP History:                 BP Readings from Last 6 Encounters:   08/29/21 120/80   08/13/21 122/73   07/13/21 126/76   06/18/21 134/76   06/02/21 144/85  04/23/21 134/80       Biochemical Data:              A1C:    HGB A1C, POC   Date Value Ref Range Status   03/31/2020 8.7 (H) <7.0 % Final     Comment:     A1c Glycemic Goal: <7.0%     **Goals should be individualized; more or less stringent A1c glycemic goals may be appropriate for individual patients.      (Adopted from: 2020 ADA Standards of Medical Care In Diabetes)  Point of Care A1c testing is not FDA-approved for the diagnosis of Diabetes.     Hemoglobin A1C   Date Value Ref Range Status   08/13/2021 6.5 (H) 4.8 - 5.6 % Final                 Estimated Blood Glucose:   Lab Results   Component Value Date    Estimated Average Glucose 140 08/13/2021    EST AVERAGE GLUCOSE, POC 203 03/31/2020                Total Cholesterol:   Cholesterol   Date Value Ref Range Status   06/18/2021 190 <=200 mg/dL Final     Cholesterol, Total   Date Value Ref Range Status   02/15/2013 208 (H) 100 - 199 mg/dL Final                Trig:   Triglycerides   Date Value Ref Range Status   06/18/2021 163 (H) 0 - 150 mg/dL Final   09/81/1914 782 1 - 149 mg/dL Final                 HDL:   HDL   Date Value Ref Range Status   06/18/2021 42 40 - 60 mg/dL Final   95/62/1308 79 (H) 40 - 59 mg/dL Final                  LDL:   LDL Calculated   Date Value Ref Range Status   06/18/2021 115 (H) 40 - 99 mg/dL Final     Comment: NHLBI Recommended Ranges, LDL Cholesterol, for Adults (20+yrs) (ATPIII), mg/dL  Optimal              <657  Near Optimal        100-129  Borderline High     130-159  High                160-189  Very High            >=190  NHLBI Recommended Ranges, LDL Cholesterol, for Children (2-19 yrs), mg/dL  Desirable            <846  Borderline High     110-129  High                 >=130       LDL Cholesterol, Calculated   Date Value Ref Range Status   02/15/2013 100 mg/dL Final     Comment:     :  ADULTS (20 years or older)  Optimal         <100  Near Optimal    100-129  Borderline High 130-159  High            160-189  Very High       >=190  CHILDREN (2-19 years)  Desirable       <110  Borderline High 110-129  High            >/= 130       Current Medications, Herbs, Supplements:    Current Outpatient Medications:   ???  amLODIPine (NORVASC) 5 MG tablet, Take 1 tablet (5 mg total) by mouth daily., Disp: 30 tablet, Rfl: 3  ???  betamethasone, augmented, (DIPROLENE) 0.05 % ointment, Apply twice daily to active areas until smooth, Disp: 150 g, Rfl: 5  ???  cetirizine (ZYRTEC) 10 MG tablet, TAKE ONE TABLET BY MOUTH EVERY DAY, Disp: 30 tablet, Rfl: 11  ???  chlorhexidine (HIBICLENS) 4 % external liquid, Apply 1 application topically Two (2) times a day for 3 days. Scrub body and operative site for 10 minutes during shower/bath twice-a-day for 3 days before surgery (Most importantly, the evening before and morning of surgery).  Leave on 2-3 minutes before rinsing., Disp: 222 mL, Rfl: 0  ???  CREON 24,000-76,000 -120,000 unit CpDR delayed release capsule, TAKE 1 CAPSULE BY MOUTH THREE TIMES DAILY WITH A MEAL, Disp: 90 capsule, Rfl: 5  ???  dupilumab 300 mg/2 mL PnIj, Inject the contents of 1 pen (300 mg) under the skin once a week., Disp: 8 mL, Rfl: 6  ???  empty container Misc, Use as directed to dispose of Dupixent pens., Disp: 1 each, Rfl: 2  ???  enalapril (VASOTEC) 20 MG tablet, TAKE ONE TABLET BY MOUTH EVERY DAY, Disp: 90 tablet, Rfl: 2  ??? gabapentin (NEURONTIN) 300 MG capsule, Take 3 capsules (900 mg total) by mouth two (2) times a day., Disp: 270 capsule, Rfl: 6  ???  halobetasol (ULTRAVATE) 0.05 % ointment, APPLY TO AFFECTED AREA TWICE A DAY AS NEEDED AVOID FACE AND FOLDS, Disp: 30 g, Rfl: 1  ???  halobetasol (ULTRAVATE) 0.05 % ointment, Apply topically Two (2) times a day. To active areas until smooth, Disp: 100 g, Rfl: 4  ???  hydrOXYzine (ATARAX) 25 MG tablet, Take 1 tablet (25 mg total) by mouth at bedtime. Prn pruritus (Patient not taking: Reported on 09/13/2021), Disp: 30 tablet, Rfl: 0  ???  ivermectin (STROMECTOL) 3 mg Tab, Take 5 tablets by mouth once today and then take another 5 tablets by mouth 1 week later (Patient not taking: Reported on 09/10/2021), Disp: 10 tablet, Rfl: 0  ???  methylPREDNISolone (MEDROL DOSEPACK) 4 mg tablet, follow package directions (Patient not taking: Reported on 09/10/2021), Disp: 1 each, Rfl: 0  ???  mirtazapine (REMERON) 30 MG tablet, TAKE ONE TABLET BY MOUTH EVERY DAY, Disp: 30 tablet, Rfl: 6  ???  mupirocin (BACTROBAN) 2 % ointment, Apply topically Two (2) times a day for 3 days. Use Q-tip to apply a small amount to the inside of both nostrils, twice a day, for the 3 days prior to surgery (including AM of surgery). Pinch nose to distribute evenly., Disp: 22 g, Rfl: 0  ???  pantoprazole (PROTONIX) 20 MG tablet, Take 1 tablet (20 mg total) by mouth daily., Disp: 90 tablet, Rfl: 3  ???  permethrin (ELIMITE) 5 % cream, Apply to entire body for 8 to 14 hours then rinse off, then repeat 1 week later, Disp: 60 g, Rfl: 0  ???  PREZCOBIX 800-150 mg-mg tablet, TAKE ONE TABLET BY MOUTH DAILY, Disp: 30 tablet, Rfl: 6  ???  TIVICAY 50 mg TABLET, TAKE ONE TABLET BY MOUTH DAILY, Disp: 30 tablet, Rfl: 6  ???  triamcinolone (KENALOG) 0.1 % ointment, Apply to itchy spots 2 times daily. For elbows at night time, apply medicine and then  cover with saran wrap. (Patient not taking: Reported on 09/10/2021), Disp: 454 g, Rfl: 0  ???  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 For each statement, please tell me whether the statement was often true, sometimes true, or never true for your household in the last month.     1. ???We worried whether our food would run out before we got money to buy more.???   Sometimes True     2. ???The food that we bought just didn't last, and we didn't have money to get more.  Sometimes True       Usual Daily Food Choices:      24-Hour Recall/Usual Intake:  Time Intake   Breakfast hashbrowns   Snack (AM) Muscle milk   Lunch Chicken noodle soup   Snack (PM) Muscle milk   Dinner Chicken, rice, vegetables   Snack (HS)      Food and Nutrient Intake:  Snacks:  Chips, cookies, chips  Beverages:  Tea, sparking water  Dining Out:  n/a  Cooking Methods: bbq, grilling, baking  Usual Food Choices: fruit, cookies, chips, ice cream   Meal Schedule:  irregular    Behavioral Factors:  Overeating: Consumes sugar-sweetened beverages in large portions.   Emotional Eating: No issues noted.   Grazing: Denied issues.   Fast Eating: Denied issues.   Nighttime Eating: Nighttime overeating.    Factors Affecting Food Intake:  Knowledge and Beliefs: He presents with food and nutrition-related knowledge deficit. He has not previously met with dietitian or participated in nutrition program.   Stress/Anxiety: Did not report issues with stress/anxiety.   Sleep Patterns: Did not report issues with sleeping habits and patterns.  Food Safety and Access: He noted limited finances and resources.  He has SNAP/EBT benefits.   Other: n/a        Food Intolerances/Dietary Restrictions:  No known food allergies or food intolerances.     Other GI Issues:  Heartburn    Hunger and Satiety:  Denied issues.       Allergies:  is allergic to sulfa (sulfonamide antibiotics) and sulfur.      Usual Daily Physical Activity Factors: >/=1.0 to <1.4 (Sedentary)  Method for estimating physical activity factor: 2015-2020 DRI Guidelines    Estimated Adherence:  Client self-reported adherence score 8  Method for estimating adherence: Adult Meducation Readiness Ruler      NUTRITION DIAGNOSIS:   Inadequate  energy intake   Inadequate  intake of grains  Inadequate  intake of fruits  Inadequate  intake of vegetables  Inadequate  intake of milk/milk products  Inadequate  intake of meat, poultry, fish, eggs, beans, nut products    Method for estimating nutritional adequacy: 2015-2020 DRI Guidelines     Clinical Diagnosis:  Excessive Carbohydrate Intake as related to nutrition-knowledge deficit, cravings for sugary foods as evidenced by most recent hbA1c of 6.4% and self-reported high intake of processed carbohydrates.            NUTRITION INTERVENTIONS/PLAN OF CARE    Nutrition Counseling     Nutrition Education     Behavior therapy strategies of self-monitoring food intake, physical activity and goal setting.    Coordination of care via Mail and Medical Record    Client agreed to follow interventions:  Yes       Nutrition-Related Medication Management: Nutrition-related complimentary/alternative medicine      Nutrition Goals:  1. Add/include protein source at meal times--> chicken, fish, [place], beans, eggs, cottage cheese, nuts and nut butters  2. Moving calories to AM/lunch to reduce cravings at night  3. Balanced meals/the plate method:  4. Healthier snack ideas (protein/fiber-rich)  -protein bars (Pure Protein OR [facility] Protein)- no more than 5g added sugar with at least 10g protein  -mixed nuts/peanuts  -various fruits with peanut butter  5. Incorporate more whole-grains into daily intake  -whole-wheat bread  -brown rice  -whole-grain cereals  6. Use the plate method:  -1/2 plate non-starchy vegetables  -1/4 plate lean proteins  -1/4 plate whole-grain carbohydrates  7. Consistent meal schedule-try to avoid skipping meals/eating every 3/4 hours.      Patient Education:  Individualized nutrition counseling and education provided on:   Macronutrient Pairing  The Plate Method  Protein Sources  Fiber Intake  Meal Schedule    Materials Provided:  Patient instructions: nutrition-related recommendations   Handouts supporting dietary recommendations: Added Sugars, Dietitian Materials:   Radiographer, therapeutic , Online resources         NUTRITION MONITORING AND EVALUATION:  Follow-up appointment: telephone/video in 4 weeks (April 17th at 930am)      Goals for next appointment:   1. Add/include protein source at meal times--> chicken, fish, [place], beans, eggs, cottage cheese, nuts and nut butters  2. Moving calories to AM/lunch to reduce cravings at night  3. Balanced meals/the plate method:  4. Healthier snack ideas (protein/fiber-rich)  -protein bars (Pure Protein OR [facility] Protein)- no more than 5g added sugar with at least 10g protein  -mixed nuts/peanuts  -various fruits with peanut butter  5. Incorporate more whole-grains into daily intake  -whole-wheat bread  -brown rice  -whole-grain cereals  6. Use the plate method:  -1/2 plate non-starchy vegetables  -1/4 plate lean proteins  -1/4 plate whole-grain carbohydrates  7. Consistent meal schedule-try to avoid skipping meals/eating every 3/4 hours.                                     Number of RD Visits: 2 of 14 visits    Length of visit was: 18 minutes      **Pt has Medicare as insurance. It does not meet criteria for billing services for this nutritional service. Therefore it will be covered under the [facility]**        The patient reports they are currently: at home. I spent 18 minutes on the phone with the patient on the date of service. I spent an additional 4 minutes on pre- and post-visit activities on the date of service.     The patient was physically located in [place] or a state in which I am permitted to provide  care. The patient and/or parent/guardian understood that s/he may incur co-pays and cost sharing, and agreed to the telemedicine visit. The visit was reasonable and appropriate under the circumstances given the patient's presentation at the time.    The patient and/or parent/guardian has been advised of the potential risks and limitations of this mode of treatment (including, but not limited to, the absence of in-person examination) and has agreed to be treated using telemedicine. The patient's/patient's family's questions regarding telemedicine have been answered.     If the visit was completed in an ambulatory setting, the patient and/or parent/guardian has also been advised to contact their provider???s office for worsening conditions, and seek emergency medical treatment and/or call 911 if the patient deems either necessary.      Signed:  Unk Pinto  09/14/2021 9:07 AM

## 2021-09-15 NOTE — Unmapped (Signed)
Telephone call with patient.  Evaluated him in the office earlier this week.  He was referred to me for evaluation of surgical candidacy.    Explained to the patient that he is not a candidate for surgery while he has a detectable HIV viral load.    We discussed the unique risks of surgery in patients who have suboptimally controlled HIV.  We will have to postpone his surgery until this is under better control    3-minute phone call both myself and the patient were located in the state of [place] and I was located in a [facility] facility

## 2021-09-15 NOTE — Unmapped (Signed)
Spoke to patient on the phone and let her know that chest x-ray did not show any consolidations indicative of pneumonia and viral testing was negative for COVID/flu/RSV.

## 2021-09-17 MED FILL — AMLODIPINE 5 MG TABLET: ORAL | 30 days supply | Qty: 30 | Fill #1

## 2021-09-17 NOTE — Unmapped (Signed)
I saw and evaluated the patient, participating in the key portions of the service.  I reviewed the resident’s note.  I agree with the resident’s findings and plan. Tawan Degroote R Kameryn Tisdel, MD

## 2021-09-17 NOTE — Unmapped (Signed)
Referral Services Note     Duration of Intervention: 6 minutes    TYPE OF CONTACT: Phone    ASSESSMENT: Pt called and reported, my HIV count is out of whack. Pt requested more insight and information regarding his viral load, as he reported he is currently not able to have surgery due to his recent lab results. Pt endorsed that he has been taking his HIV medication as prescribed.    INTERVENTION: SW implemented person-centered therapy featuring active listening, empathy, validation, and congruence within the therapeutic relationship. SW contacted Rayburn Ma, RN, and Minerva Fester, MD, to request a follow-up with the pt regarding lab results and medical concerns.     PLAN: Provider, Minerva Fester, MD, will contact pt to follow-up regarding medical concerns.    Luella Cook, LCSW-A, LCAS-A, NCTTP  Social Work Practitioner - [facility]

## 2021-09-18 NOTE — Unmapped (Signed)
Referral Services Note     Duration of Intervention: 45 minutes    TYPE OF CONTACT: Face to Face - In Person and Medicaid Transportation    ASSESSMENT: Pt had difficulty scheduling return ride home, utilizing Medicaid transportation services. Pt had to wait 45 minutes to be picked up.     INTERVENTION:  SW assisted pt with contacting the pts Medicaid transportation services to help coordinate transportation from the hospital to home. SW waited with pt for 45 minutes until he was picked up. Sw encouraged pt to schedule medical appointments earlier within the day to avoid evening traffic and transportation miscommunication.     PLAN:  Pt will be picked up from the hospital, utilizing Medicaid transportation services at 5:30 pm.    Luella Cook, LCSW-A, LCAS-A, NCTTP  Social Work Practitioner - [facility]

## 2021-09-19 MED ORDER — CETIRIZINE 10 MG TABLET
ORAL_TABLET | 2 refills | 0 days | Status: CP
Start: 2021-09-19 — End: ?

## 2021-09-21 NOTE — Unmapped (Addendum)
-----   Message from Franki Cabot sent at 09/21/2021  9:22 AM EDT -----  Incoming Call  Caller: Timothy Tapia callback number: [phone]  Reason for call: Has questions about his medication delivery    It looks like the most recent prescriptions were sent to the pharmacy here at [facility].  I called Sherwin and left a message on his voice mail and also sent him a text message.

## 2021-09-24 ENCOUNTER — Ambulatory Visit: Admit: 2021-09-24 | Discharge: 2021-09-25 | Payer: MEDICARE

## 2021-09-24 NOTE — Unmapped (Signed)
Thank you for choosing [facility]!  We appreciate the opportunity to participate in your care. Please let us know if we can be of assistance your orthopaedic issues in the future.     If you have questions or concerns, please do not hesitate to contact us by [facility] or by calling [phone] to speak with one of our clinical support sports team members.     We aim to provide you with the highest quality, individualized care.  If you have any unanswered questions after the visit, please do not hesitate to reach out to [place] on MyChart or leave a message for the nurse.    [facility] MyChart Website: [email or web address]  ?  MyChart messages: These messages can be sent to your provider and will be checked by their clinical support staff.? The messages are checked throughout the day during normal business hours from 8:30 am-4:00 pm Monday-Friday, however responses may take up to 48 hours.? Please use this method of communication for non-urgent and non-emergent concerns, questions, refill requests or inquiries only.? ?Our team will help respond to all of your questions.? Please note that you may be asked to see a provider by either a telehealth or in person visit if it is deemed your questions are best handled in the clinic setting in person.??  ?  Please keep in mind, these messages are not real time communications, so be patient when waiting for a response.    If you do not have access to MyChart, do not know how to use MyChart or have an issue that may require more extensive discussion, please call the nurses' call line: [phone].? This line is checked throughout the day and will be responded to as time allows.? Please note that return calls could take up to 48 hours, depending on the nature of the need.?  ?  If you have an issue that requires emergent attention that cannot wait; either call the Orthopaedics resident on call at [phone], consider coming to our [facility] walk-in clinic, or go to the nearest Emergency Department.    If you need to schedule future appointments, please call [phone].     We look forward to seeing you again in the future and appreciate you choosing [facility] for your care!    Thank you,  Ethlyn Gallery, PA         [facility]:  Monday - Friday 8 am - 4 pm  [address]  [address] Floor, [address]  [place], [place]  [address]

## 2021-09-24 NOTE — Unmapped (Signed)
ORTHOPAEDIC CLINIC NOTE  Date: 09/24/2021   [facility] ([facility] Orthopaedics    Primary Care Physician: Lynnea Ferrier, MD      ASSESSMENT:  Timothy Tapia is a 56 y.o. male with the following visit diagnoses:    ICD-10-CM   1. Acute pain of right shoulder  M25.511   2. Lumbar spondylosis  M47.816       PLAN:    - Home exercise program provided  - Patient will use OTC medication for swelling/pain control  - Ice affected area for 15-20 minutes every hour as needed  - Corticosteroid injection administered to the right shoulder as described below  - Obtain further imaging (MRI Right Upper Extremity). Follow up after MRI to review.    -  All relevant prior imaging was independently interpreted and reviewed by me prior to today's exam.    Requested Prescriptions      No prescriptions requested or ordered in this encounter        SUBJECTIVE:  Chief complaint: Right shoulder pain     History of Present Illness:   Patient complains of right shoulder pain. Patient is right-hand dominant. The symptoms began several years ago. Aggravating factors: no known event. Pain is located diffusely throughout the shoulder. Discomfort is described as aching and throbbing. Symptoms are exacerbated by repetitive movements, overhead movements and lying on the shoulder. Evaluation to date: previously evaluated at an outside clinic years ago.. Therapy to date includes: rest, ice, OTC analgesics which are somewhat effective and corticosteroid injection which was effective. Patient reports last corticosteroid injection was years ago. He reports they have been very beneficial in the past. Denies recent injury. No previous shoulder surgeries.     Patient denies numbness/tingling distally.    Pain Assessment: 0-10  0-10 Pain Scale: 7  Pain Location: Shoulder  Pain Orientation: Right      Review of Systems Pertinent positives and negatives are documented in the HPI. All other systems reviewed are negative.   Medical History Past Medical History:   Diagnosis Date ??? Acute pancreatitis    ??? Barrett esophagus    ??? Black stool 07/08/2017   ??? Bronchitis    ??? Colon polyp    ??? Eczema    ??? Gastroesophageal reflux disease    ??? HIV disease (CMS-HCC)    ??? Hypertension    ??? Lack of access to transportation    ??? Pancreatic pseudocyst    ??? 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 Visual impairment       Surgical History Past Surgical History:   Procedure Laterality Date   ??? esophogus surgery     ??? PR COLONOSCOPY W/BIOPSY SINGLE/MULTIPLE  10/11/2013    Procedure: COLONOSCOPY, FLEXIBLE, PROXIMAL TO SPLENIC FLEXURE; WITH BIOPSY, SINGLE OR MULTIPLE;  Surgeon: Teodoro Spray, MD;  Location: [facility];  Service: Gastroenterology   ??? PR COLONOSCOPY W/BIOPSY SINGLE/MULTIPLE N/A 09/29/2014    Procedure: COLONOSCOPY, FLEXIBLE, PROXIMAL TO SPLENIC FLEXURE; WITH BIOPSY, SINGLE OR MULTIPLE;  Surgeon: Bronson Curb, MD;  Location: [facility] PROCEDURES [facility];  Service: Gastroenterology   ??? PR COLONOSCOPY W/BIOPSY SINGLE/MULTIPLE N/A 09/19/2015    Procedure: COLONOSCOPY, FLEXIBLE, PROXIMAL TO SPLENIC FLEXURE; WITH BIOPSY, SINGLE OR MULTIPLE;  Surgeon: Mayford Knife, MD;  Location: [facility];  Service: Gastroenterology   ??? PR COLSC FLX W/RMVL OF TUMOR POLYP LESION SNARE TQ N/A 09/19/2015    Procedure: COLONOSCOPY FLEX; W/REMOV TUMOR/LES BY  SNARE;  Surgeon: Mayford Knife, MD;  Location: [facility];  Service: Gastroenterology   ??? PR ENDOSCOPIC US EXAM, ESOPH N/A 08/10/2015    Procedure: UGI ENDOSCOPY; WITH ENDOSCOPIC ULTRASOUND EXAMINATION LIMITED TO THE ESOPHAGUS;  Surgeon: Vonda Antigua, MD;  Location: [facility];  Service: Gastroenterology   ??? PR UPGI ENDOSCOPY,FN NEEDLE BX,GUIDED N/A 09/22/2014    Procedure: UGI W/TRANSENDOSCOPIC US-GUIDE INTRA/TRANSMURAL NEEDLE ASP/BX (INCL EXAM ESOPHAGUS, STOMACH, DOUDENUM/JEJ);  Surgeon: Ned Grace, MD;  Location: [facility] PROCEDURES [facility];  Service: [facility]  Service: Gastroenterology   ??? PR UPGI ENDOSCOPY,FN NEEDLE BX,GUIDED N/A 09/22/2014    Procedure: UGI W/TRANSENDOSCOPIC US-GUIDE INTRA/TRANSMURAL NEEDLE ASP/BX (INCL EXAM ESOPHAGUS, STOMACH, DOUDENUM/JEJ);  Surgeon: Ned Grace, MD;  Location: [facility];  Service: Gastroenterology   ??? PR UPPER GI ENDOSCOPY,BIOPSY N/A 09/19/2015    Procedure: UGI ENDOSCOPY; WITH BIOPSY, SINGLE OR MULTIPLE;  Surgeon: Mayford Knife, MD;  Location: [facility];  Service: Gastroenterology   ??? PR UPPER GI ENDOSCOPY,BIOPSY N/A 07/14/2017    Procedure: UGI ENDOSCOPY; WITH BIOPSY, SINGLE OR MULTIPLE;  Surgeon: Alfred Levins, MD;  Location: [facility];  Service: Gastroenterology      Allergies Sulfa (sulfonamide antibiotics) and Sulfur   Medications He has a current medication list which includes the following prescription(s): amlodipine, betamethasone (augmented), cetirizine, creon, dupilumab, empty container, enalapril, gabapentin, halobetasol, halobetasol, hydroxyzine, ivermectin, methylprednisolone, mirtazapine, mupirocin, pantoprazole, permethrin, prezcobix, tivicay, triamcinolone, and ventolin hfa.   Family History His family history includes COPD in his father; Diabetes in his mother; Hypertension in his mother.   Social History He reports that he has been smoking cigarettes. He started smoking about 28 years ago. He has a 6.75 pack-year smoking history. He has never used smokeless tobacco. He reports that he does not currently use alcohol after a past usage of about 3.0 standard drinks per week. He reports that he does not currently use drugs after having used the following drugs: Marijuana.Home [address]  [place] [address]        Occupational History   ??? Not on file     Social History     Socioeconomic History   ??? Marital status: Legally Separated     Spouse name: None   ??? Number of children: None   ??? Years of education: None   ??? Highest education level: None   Tobacco Use   ??? Smoking status: Every Day     Packs/day: 0.25     Years: 27.00     Pack years: 6.75     Types: Cigarettes     Start date: 06/28/1993   ??? Smokeless tobacco: Never   ??? Tobacco comments:     1 pack lasts 4 days; referred to [facility]; Taking nicorette gum   Vaping Use   ??? Vaping Use: Never used   Substance and Sexual Activity   ??? Alcohol use: Not Currently     Alcohol/week: 3.0 standard drinks     Types: 3 Cans of beer per week     Comment: Na   ??? 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 Sexual activity: Yes     Partners: Female   Other Topics Concern   ??? Do you use sunscreen? No   ??? Tanning bed use? No   ???  Are you easily burned? No   ??? Excessive sun exposure? No   ??? Blistering sunburns? No   Social History Narrative    Works as a Nutritional therapist.     Social Determinants of Health     Financial Resource Strain: Low Risk    ??? Difficulty of Paying Living Expenses: Not hard at all   Food Insecurity: Food Insecurity Present   ??? Worried About Programme researcher, broadcasting/film/video in the Last Year: Sometimes true   ??? Ran Out of Food in the Last Year: Sometimes true   Transportation Needs: No Transportation Needs   ??? Lack of Transportation (Medical): No   ??? Lack of Transportation (Non-Medical): No   Physical Activity: Sufficiently Active   ??? Days of Exercise per Week: 5 days   ??? Minutes of Exercise per Session: 30 min   Stress: No Stress Concern Present   ??? Feeling of Stress : Not at all   Social Connections: Moderately Isolated   ??? Frequency of Communication with Friends and Family: More than three times a week   ??? Frequency of Social Gatherings with Friends and Family: More than three times a week   ??? Attends Religious Services: More than 4 times per year   ??? Active Member of Clubs or Organizations: No   ??? Attends Banker Meetings: Never   ??? Marital Status: Separated        OBJECTIVE:  DETAILED PHYSICAL EXAM (12 touch distally: Normal   MUSCULOSKELETAL    Right Shoulder  Inspection/palpation  Range of motion  Stability  Strength  Skin ??? Inspection: No swelling, erythema, or deformity  ?? Palpation: Tender: a.c. joint, long head biceps tendon/bicepital groove, anterior deltoid  ?? ROM: 120 degrees flexion, IR to T10, and 45 degrees external rotation  ??? Strength: 5/5 supraspinatus with pain and 5/5 infraspinatus with pain  ??? Special Tests:   ??? Positive Neer's test  ??? Positive Hawkin's test  ??? Positive Speed's test  ??? Negative Empty Can test  ??? Negative Horn Blower's test  ??? Negative Apprehension/Relocation test  ??? Positive  Crossarm Adduction test  ??? Negative O'Brien's test  ??? Skin:  Warm, dry, and intact     Test Results  Imaging  Five views of the right shoulder were obtained today and independently reviewed by me and reveal ***    Procedure  Injection: After discussing the risks and benefits of corticosteroid injection, informed consent was obtained and a timeout was performed. The patient's right shoulder was prepped using chloraprep. Topical anesthesia was obtained using ethyl chloride spray, followed by injection using 1ml of 40 mg/mL of Kenalog, 3ml of 0.5% ropivacaine into the subacromial space.  The patient tolerated the injection well and understands what to expect over the next several days.     MEDICAL DECISION MAKING (level of service defined by 2/3 elements)     Number/Complexity of Problems Addressed 1 acute, uncomplicated illness or injury (99203/99213)   Amount/Complexity of Data to be Reviewed/Analyzed 2 points: Review prior notes (1 point per unique source); Review test results (1 point per unique test); Order tests (1 point per unique test) (99203/99213)   Risk of Complications/Morbidity/Mortality of Management Over-the-counter Medications (99203/99213) unique source); Review test results (1 point per unique test); Order tests (1 point per unique test) (99203/99213)   Risk of Complications/Morbidity/Mortality of Management Over-the-counter Medications (99203/99213)

## 2021-09-24 NOTE — Unmapped (Signed)
Complex Case Management  SUMMARY NOTE    COMPLEX CASE MANAGEMENT   Brief Note    CM called patient at designated time, he asked to reschedule phone appt. Rescheduled for Kyle Stansell 3rd, at 1530    Plan:     1. Case Management to: Follow up in 1 weeks    2. Patient/caregiver to: attend doctor appt today    Discuss at next outreach: see care plan    Care Coordination Note updated in [facility]: Yes

## 2021-09-27 NOTE — Unmapped (Signed)
[facility] Refill Coordination Note    Specialty Medication(s) to be Shipped:   Inflammatory Disorders: Dupixent    Other medication(s) to be shipped: No additional medications requested for fill at this time     Timothy Tapia, DOB: 02/10/1966  Phone: [phone] (home) [phone] (work)      All above HIPAA information was verified with patient.     Was a Nurse, learning disability used for this call? No    Completed refill call assessment today to schedule patient's medication shipment from the [facility] [phone]).  All relevant notes have been reviewed.     Specialty medication(s) and dose(s) confirmed: Regimen is correct and unchanged.   Changes to medications: [name] reports no changes at this time.  Changes to insurance: No  New side effects reported not previously addressed with a pharmacist or physician: None reported  Questions for the pharmacist: No    Confirmed patient received a Conservation officer, historic buildings and a Surveyor, mining with first shipment. The patient will receive a drug information handout for each medication shipped and additional FDA Medication Guides as required.       DISEASE/MEDICATION-SPECIFIC INFORMATION        For patients on injectable medications: Patient currently has 1 doses left.  Next injection is scheduled for 04/05.    SPECIALTY MEDICATION ADHERENCE     Medication Adherence    Patient reported X missed doses in the last month: 0  Specialty Medication: dupixent 300mg /82ml  Patient is on additional specialty medications: No  Patient is on more than two specialty medications: No  Any gaps in refill history greater than 2 weeks in the last 3 months: no  Demonstrates understanding of importance of adherence: yes  Informant: patient  Reliability of informant: reliable  Provider-estimated medication adherence level: good  Patient is at risk for Non-Adherence: No  Reasons for non-adherence: no problems identified  Confirmed plan for next specialty medication refill: delivery by pharmacy  Refills needed for supportive medications: not needed          Refill Coordination    Has the Patients' Contact Information Changed: No  Is the Shipping Address Different: No         Were doses missed due to medication being on hold? No    dupxie 300/2 mg/ml: 7 days of medicine on hand         REFERRAL TO PHARMACIST     Referral to the pharmacist: Not needed      [facility]     Shipping address confirmed in Epic.     Delivery Scheduled: Yes, Expected medication delivery date: 04/06.     Medication will be delivered via Same Day Courier to the prescription address in Epic WAM.    Antonietta Barcelona   [facility] Specialty Technician

## 2021-10-01 NOTE — Unmapped (Signed)
COMPLEX CASE MANAGEMENT   FOLLOW UP NOTE  Summary:  Complex Case Manager  spoke with patient and verified correct patient using two identifiers today for Complex Case Management follow up. Patient currently resides at Home. Primary concern is denies.      Subjective:    Patient/caregiver reported he has scheduled dental appointments, attended RD appt last month, and updated housing authority with current address.      Objective:     Screenings Completed during visit: None completed at this visit    Barriers to care: Health Literacy    Interventions provided: Supportive Listening , scheduled dental appointments, attended RD appt last month, and updated housing authority with current address.      Progress towards  goal : on track     Plan:     1. Case Management to: Follow up in 4 weeks  4    2. Patient/caregiver to: attend RD appt mid-Carmin Alvidrez     Discuss at next outreach:  see care plan    Care Coordination Note updated in [facility]: Yes    Future Appointments   Date Time Provider Department Center   10/09/2021 12:30 PM [facility] RM 1 IMGMRIET [place] - [facility]   10/11/2021 10:15 AM Ethel Rana, PA UDERMRKT [facility] [facility]   10/15/2021  9:30 AM Unk Pinto UNCINFDISET [facility]   11/16/2021  8:40 AM Hunt Oris, MD UNCGIMEDET [facility]   11/21/2021 10:30 AM Joeline [facility]ron [facility], PT PTOTACC [facility]       Problem List          Pancreas inflammation       High blood pressure       Barrett's esophagus       Asthma       DVT (deep venous thrombosis), IMV thrombus 10/07/13 provoked per hematology (65mo therapy)       CYST AND PSEUDOCYST OF PANCREAS       Bleeding of the stomach and intestines       Chronic airway obstruction       Low blood potassium       Acid reflux       Allergies:   Allergies   Allergen Reactions    Sulfa (Sulfonamide Antibiotics) Hives    Sulfur Hives       Medications:  Prior to Admission medications    Medication Dose, Route, Frequency   amLODIPine (NORVASC) 5 MG tablet 5 mg, Oral, Daily (standard)   betamethasone, augmented, (DIPROLENE) 0.05 % ointment Apply twice daily to active areas until smooth   cetirizine (ZYRTEC) 10 MG tablet TAKE ONE TABLET BY MOUTH EVERY DAY   CREON 24,000-76,000 -120,000 unit CpDR delayed release capsule TAKE 1 CAPSULE BY MOUTH THREE TIMES DAILY WITH A MEAL   dupilumab 300 mg/2 mL PnIj Inject the contents of 1 pen (300 mg) under the skin once a week.   empty container Misc Use as directed to dispose of Dupixent pens.   enalapril (VASOTEC) 20 MG tablet TAKE ONE TABLET BY MOUTH EVERY DAY   gabapentin (NEURONTIN) 300 MG capsule 900 mg, Oral, 2 times a day   halobetasol (ULTRAVATE) 0.05 % ointment APPLY TO AFFECTED AREA TWICE A DAY AS NEEDED AVOID FACE AND FOLDS   halobetasol (ULTRAVATE) 0.05 % ointment Topical, 2 times a day (standard), To active areas until smooth   hydrOXYzine (ATARAX) 25 MG tablet 25 mg, Oral, At bedtime, Prn pruritus  Patient not taking: Reported on 09/13/2021   ivermectin (STROMECTOL)  3 mg Tab Take 5 tablets by mouth once today and then take another 5 tablets by mouth 1 week later  Patient not taking: Reported on 09/10/2021   methylPREDNISolone (MEDROL DOSEPACK) 4 mg tablet follow package directions  Patient not taking: Reported on 09/10/2021   mirtazapine (REMERON) 30 MG tablet TAKE ONE TABLET BY MOUTH EVERY DAY   pantoprazole (PROTONIX) 20 MG tablet 20 mg, Oral, Daily (standard)   permethrin (ELIMITE) 5 % cream Apply to entire body for 8 to 14 hours then rinse off, then repeat 1 week later   PREZCOBIX 800-150 mg-mg tablet TAKE ONE TABLET BY MOUTH DAILY   TIVICAY 50 mg TABLET TAKE ONE TABLET BY MOUTH DAILY   triamcinolone (KENALOG) 0.1 % ointment Apply to itchy spots 2 times daily. For elbows at night time, apply medicine and then cover with saran wrap.  Patient not taking: Reported on 09/10/2021   VENTOLIN HFA 90 mcg/actuation inhaler INHALE 2 PUFFS UP TO FOUR TIMES DAILY AS NEEDED          Kaige Whistler, [place]  10/01/2021

## 2021-10-04 MED FILL — DUPIXENT 300 MG/2 ML SUBCUTANEOUS PEN INJECTOR: SUBCUTANEOUS | 28 days supply | Qty: 8 | Fill #0

## 2021-10-10 NOTE — Unmapped (Unsigned)
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  - ??Biopsy 03/2019 w/ spongiosis, delicate parakeratosis, superficial dermal perivascular lymphocytic??infiltrate. Biopsy 03/2020 with??Epidermal acanthosis with focal spongiosis and prominent papillary dermal fibrosis.  -??Started Dupixent 05/2020 and has responded well; previously discussed opt of JAK inhibitor??--??some literature suggests JAK inhibitor use in HIV patients to reduce inflammation; pt has hx of DVT so not a candidate for JAK inhibitor though  - Previously treated for scabies infection with ivermectin to make sure all bases were covered  - Continue dupilumab 300 mg/2 mL PnIj; Inject 300 mg under the skin once a week.   -??Continue??betamethasone 0.05% ointment BID to active areas until smooth.??Reviewed side effects of topical corticosteroids including atrophy, striae and dyschromia; however, discussed the current light areas are PIH and secondary to eczema and not the medications  - Encouraged to apply Eucerin daily??to moisturize    There are no diagnoses linked to this encounter.    The patient was advised to call for an appointment should any new, changing, or symptomatic lesions develop.     RTC: No follow-ups on file. or sooner as needed   _________________________________________________________________      Chief Complaint     Chief Complaint   Patient presents with   ??? Follow-up     Eczema, pt states it is better, cleared up well after changing body wash to aveeno        HPI     Timothy Tapia is a 56 y.o. male who presents as a returning patient (last seen by Sharene Butters on 06/18/2021) to Dermatology for follow up of eczema. At last visit, patient was to continue dupilumab 300 mg subcutaneous every week and betamethasone 0.05% ointment for atopic dermatitis.     ***    The patient denies any other new or changing lesions or areas of concern.     Pertinent Past Medical History     No history of skin cancer  HIV(+)    Family History:   Unknown     Past Medical History, Family History, Social History, Medication List, Allergies, and Problem List were reviewed in the rooming section of Epic.     ROS: Other than symptoms mentioned in the HPI, no fevers, chills, or other skin complaints    Physical Examination     GENERAL: Well-appearing male in no acute distress, resting comfortably.  NEURO: Alert and oriented, answers questions appropriately  PSYCH: Normal mood and affect  {PE extent:75514}  {PE list:75421}  ***    All areas not commented on are within normal limits or unremarkable      (Approved Template 03/13/2020)

## 2021-10-11 ENCOUNTER — Ambulatory Visit: Admit: 2021-10-11 | Discharge: 2021-10-12 | Payer: MEDICARE

## 2021-10-11 DIAGNOSIS — B370 Candidal stomatitis: Principal | ICD-10-CM

## 2021-10-11 DIAGNOSIS — L309 Dermatitis, unspecified: Principal | ICD-10-CM

## 2021-10-11 MED ORDER — MAGIC MOUTHWASH (NYSTATIN/DIPHENHYDRAMINE/MYLANTA) ORAL MIXTURE
Freq: Four times a day (QID) | ORAL | 0 refills | 0.00000 days | Status: CP | PRN
Start: 2021-10-11 — End: 2021-10-11

## 2021-10-11 MED ORDER — CLOTRIMAZOLE 10 MG TROCHE
ORAL | 0 refills | 0.00000 days | Status: CP
Start: 2021-10-11 — End: 2021-10-11
  Filled 2021-10-11: qty 70, 14d supply, fill #0

## 2021-10-11 NOTE — Unmapped (Addendum)
For thrush, start either clotrimazole troches or magic mouthwash (whichever is better covered by insurance) 4-5 times daily for 1 week or until symptoms are resolved.     For eczema, continue dupixent and betamethasone.

## 2021-10-15 ENCOUNTER — Institutional Professional Consult (permissible substitution): Admit: 2021-10-15 | Discharge: 2021-10-16 | Payer: MEDICARE | Attending: Registered" | Primary: Registered"

## 2021-10-15 ENCOUNTER — Ambulatory Visit: Admit: 2021-10-15 | Discharge: 2021-10-16 | Payer: MEDICARE

## 2021-10-15 NOTE — Unmapped (Signed)
Nutrition Goals:  1. Add/include protein source at meal times--> chicken, fish, [place], beans, eggs, cottage cheese, nuts and nut butters  2. Moving calories to AM/lunch to reduce cravings at night  3. Balanced meals/the plate method:  4. Healthier snack ideas (protein/fiber-rich)  -protein bars (Pure Protein OR [facility] Protein)- no more than 5g added sugar with at least 10g protein  -mixed nuts/peanuts  -various fruits with peanut butter  -Muscle Milk  -Premier Protein Shake  5. Incorporate more whole-grains into daily intake  -whole-wheat bread  -brown rice  -whole-grain cereals  6. Use the plate method:  -1/2 plate non-starchy vegetables  -1/4 plate lean proteins  -1/4 plate whole-grain carbohydrates  7. Consistent meal schedule-try to avoid skipping meals/eating every 3/4 hours.

## 2021-10-15 NOTE — Unmapped (Signed)
[facility]  Adult Medical Nutrition Therapy - Nutrition Follow-Up Visit  Date: 10/15/2021    Patient Name:             Timothy Tapia    Date of Birth / Age / Birth Gender:            1966-03-15 / 56 y.o. / Male    Referring MD or Clinic:             Self, Referred      Interpreter:            No      Reason for Referral:   General, healthful diet order yes: weight management, weight reduction and diabetes      Nutritional Concerns/Comments:  56 y.o. male seeking nutritional counseling for weight management, weight reduction and diabetes.    Pt reports to follow-up nutrition visit with BMI of 27.14 kg/m2 (overweight) with a history of COPD, GERD, ETOH abuse, DVT, HTN, barrett esophagus seeking nutritional counseling to mitigate elevated glucose levels.      Since follow-up nutrition visit, pt reports he continues to improve his dietary intake by reducing his added sugars and processed carbohydrates. Pt reports he is still drinking Muscle Milk BID and consuming protein at every meal.    At today's visit, RD re-educated pt on the following:  -macronutrient pairing/food pairing  -using the plate method at meal times  -lean protein sources  -added sugars-reading nutrition facts label     DYSGLYCEMIA: Seems this elevation is attributed to undesirable food choices (added sugars, processed carbohydrates, sugar-sweetened beverages), sedentary lifestyle and nutrition-knowledge deficit.   Component Ref Range & Units 08/13/21 1002 12/11/20 1109 06/12/20 1129    Hemoglobin A1C 4.8 - 5.6 % 6.5??High??  5.9??High??  6.5??High??     Estimated Average Glucose mg/dL 161  096  045    Resulting Agency  [facility]       DIET: For breakfast, pt will have eggs, bacon and toast. Pt drinks Muscle Milk BID between meals. For dinner, pt will have beef tips, gravy and rice. Beverages include no sugar added flavored water.      PHYSICAL ACTIVITY: Pt walks to and from work daily (30 minutes).    FOOD INSECURITY: Pt screens negative. Pt receives SNAP benefits ($200). Pt receives food from [employer].    Anthropometric Data:   Height:   5'4   Weight:   156 lbs   BMI:   26.78 kg/m2                Weight History:               Wt Readings from Last 6 Encounters:   09/14/21 70.8 kg (156 lb)   09/13/21 71.7 kg (158 lb 1.6 oz)   09/10/21 69.8 kg (153 lb 12.8 oz)   08/29/21 70 kg (154 lb 6.4 oz)   08/13/21 71.6 kg (157 lb 12.8 oz)   07/13/21 73.9 kg (163 lb)       Usual body weight: 160 lbs  Ideal Body Weight:  130 lbs  Goal weight: 133 lbs      Physical Findings Data:               BP Today: n/a (telephone visit)                 BP History:  BP Readings from Last 6 Encounters:   09/14/21 138/78   08/29/21 120/80   08/13/21 122/73   07/13/21 126/76   06/18/21 134/76   06/02/21 144/85       Biochemical Data:              A1C:    HGB A1C, POC   Date Value Ref Range Status   03/31/2020 8.7 (H) <7.0 % Final     Comment:     A1c Glycemic Goal: <7.0%     **Goals should be individualized; more or less stringent A1c glycemic goals may be appropriate for individual patients.      (Adopted from: 2020 ADA Standards of Medical Care In Diabetes)  Point of Care A1c testing is not FDA-approved for the diagnosis of Diabetes.     Hemoglobin A1C   Date Value Ref Range Status   08/13/2021 6.5 (H) 4.8 - 5.6 % Final                 Estimated Blood Glucose:   Lab Results   Component Value Date    Estimated Average Glucose 140 08/13/2021    EST AVERAGE GLUCOSE, POC 203 03/31/2020                Total Cholesterol:   Cholesterol   Date Value Ref Range Status   06/18/2021 190 <=200 mg/dL Final     Cholesterol, Total   Date Value Ref Range Status   02/15/2013 208 (H) 100 - 199 mg/dL Final                Trig:   Triglycerides   Date Value Ref Range Status   06/18/2021 163 (H) 0 - 150 mg/dL Final   16/04/9603 540 1 - 149 mg/dL Final                 HDL:   HDL   Date Value Ref Range Status   06/18/2021 42 40 - 60 mg/dL Final   98/05/9146 79 (H) 40 - 59 mg/dL Final                  LDL:   LDL Calculated   Date Value Ref Range Status   06/18/2021 115 (H) 40 - 99 mg/dL Final     Comment:     NHLBI Recommended Ranges, LDL Cholesterol, for Adults (20+yrs) (ATPIII), mg/dL  Optimal              <829  Near Optimal        100-129  Borderline High     130-159  High                160-189  Very High            >=190  NHLBI Recommended Ranges, LDL Cholesterol, for Children (2-19 yrs), mg/dL  Desirable            <562  Borderline High     110-129  High                 >=130       LDL Cholesterol, Calculated   Date Value Ref Range Status   02/15/2013 100 mg/dL Final     Comment:     :  ADULTS (20 years or older)  Optimal         <100  Near Optimal    100-129  Borderline High 130-159  High  160-189  Very High       >=190  CHILDREN (2-19 years)  Desirable       <110  Borderline High 110-129  High            >/= 130       Current Medications, Herbs, Supplements:    Current Outpatient Medications:   ???  amLODIPine (NORVASC) 5 MG tablet, Take 1 tablet (5 mg total) by mouth daily., Disp: 30 tablet, Rfl: 3  ???  betamethasone, augmented, (DIPROLENE) 0.05 % ointment, Apply twice daily to active areas until smooth, Disp: 150 g, Rfl: 5  ???  cetirizine (ZYRTEC) 10 MG tablet, TAKE ONE TABLET BY MOUTH EVERY DAY, Disp: 30 tablet, Rfl: 2  ???  clotrimazole (MYCELEX) 10 mg troche, Take 1 tablet (10 mg total) by mouth five (5) times a day., Disp: 70 Troche, Rfl: 0  ???  CREON 24,000-76,000 -120,000 unit CpDR delayed release capsule, TAKE 1 CAPSULE BY MOUTH THREE TIMES DAILY WITH A MEAL, Disp: 90 capsule, Rfl: 5  ???  dupilumab 300 mg/2 mL PnIj, Inject the contents of 1 pen (300 mg) under the skin once a week., Disp: 8 mL, Rfl: 6  ???  empty container Misc, Use as directed to dispose of Dupixent pens., Disp: 1 each, Rfl: 2  ???  enalapril (VASOTEC) 20 MG tablet, TAKE ONE TABLET BY MOUTH EVERY DAY, Disp: 90 tablet, Rfl: 2  ???  gabapentin (NEURONTIN) 300 MG capsule, Take 3 capsules (900 mg total) by mouth two (2) times a day., Disp: 270 capsule, Rfl: 6  ???  halobetasol (ULTRAVATE) 0.05 % ointment, APPLY TO AFFECTED AREA TWICE A DAY AS NEEDED AVOID FACE AND FOLDS, Disp: 30 g, Rfl: 1  ???  halobetasol (ULTRAVATE) 0.05 % ointment, Apply topically Two (2) times a day. To active areas until smooth, Disp: 100 g, Rfl: 4  ???  hydrOXYzine (ATARAX) 25 MG tablet, Take 1 tablet (25 mg total) by mouth at bedtime. Prn pruritus, Disp: 30 tablet, Rfl: 0  ???  ivermectin (STROMECTOL) 3 mg Tab, Take 5 tablets by mouth once today and then take another 5 tablets by mouth 1 week later, Disp: 10 tablet, Rfl: 0  ???  Magic Mouthwash (nystatin/diphenhydramine/mylanta) Oral Mixture, Take 10 mL by mouth every six (6) hours as needed. Swish 30 seconds, gargle, spit as needed., Disp: 240 mL, Rfl: 0  ???  methylPREDNISolone (MEDROL DOSEPACK) 4 mg tablet, follow package directions, Disp: 1 each, Rfl: 0  ???  mirtazapine (REMERON) 30 MG tablet, TAKE ONE TABLET BY MOUTH EVERY DAY, Disp: 30 tablet, Rfl: 6  ???  pantoprazole (PROTONIX) 20 MG tablet, Take 1 tablet (20 mg total) by mouth daily., Disp: 90 tablet, Rfl: 3  ???  permethrin (ELIMITE) 5 % cream, Apply to entire body for 8 to 14 hours then rinse off, then repeat 1 week later, Disp: 60 g, Rfl: 0  ???  PREZCOBIX 800-150 mg-mg tablet, TAKE ONE TABLET BY MOUTH DAILY, Disp: 30 tablet, Rfl: 6  ???  TIVICAY 50 mg TABLET, TAKE ONE TABLET BY MOUTH DAILY, Disp: 30 tablet, Rfl: 6  ???  triamcinolone (KENALOG) 0.1 % ointment, Apply to itchy spots 2 times daily. For elbows at night time, apply medicine and then cover with saran wrap., Disp: 454 g, Rfl: 0  ???  VENTOLIN HFA 90 mcg/actuation inhaler, INHALE 2 PUFFS UP TO FOUR TIMES DAILY AS NEEDED, Disp: 18 g, Rfl: 11    Current Facility-Administered Medications:   ???  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 For each statement, please tell me whether the statement was often true, sometimes true, or never true for your household in the last month.     1. ???We worried whether our food would run out before we got money to buy more.???   Sometimes True     2. ???The food that we bought just didn't last, and we didn't have money to get more.  Sometimes True       Usual Daily Food Choices:      24-Hour Recall/Usual Intake:  Time Intake   Breakfast    Snack (AM) Muscle milk   Lunch    Snack (PM) Muscle milk   Dinner Rice, beef tips, gravy   Snack (HS)      Food and Nutrient Intake:  Snacks:  Chips, cookies, chips  Beverages:  Tea, sparking water  Dining Out:  n/a  Cooking Methods: bbq, grilling, baking  Usual Food Choices: fruit, cookies, chips, ice cream   Meal Schedule:  irregular    Behavioral Factors:  Overeating: Consumes sugar-sweetened beverages in large portions.   Emotional Eating: No issues noted.   Grazing: Denied issues.   Fast Eating: Denied issues.   Nighttime Eating: Nighttime overeating.    Factors Affecting Food Intake:  Knowledge and Beliefs: He presents with food and nutrition-related knowledge deficit. He has not previously met with dietitian or participated in nutrition program.   Stress/Anxiety: Did not report issues with stress/anxiety.   Sleep Patterns: Did not report issues with sleeping habits and patterns.  Food Safety and Access: He noted limited finances and resources.  He has SNAP/EBT benefits.   Other: n/a        Food Intolerances/Dietary Restrictions:  No known food allergies or food intolerances.     Other GI Issues:  Heartburn    Hunger and Satiety:  Denied issues.       Allergies:  is allergic to sulfa (sulfonamide antibiotics) and sulfur.      Usual Daily Physical Activity Factors: >/=1.0 to <1.4 (Sedentary)  Method for estimating physical activity factor: 2015-2020 DRI Guidelines    Estimated Adherence:  Client self-reported adherence score 8  Method for estimating adherence: Adult Meducation Readiness Ruler      NUTRITION DIAGNOSIS:   Inadequate  energy intake   Inadequate  intake of grains  Inadequate  intake of fruits  Inadequate  intake of vegetables  Inadequate  intake of milk/milk products  Inadequate  intake of meat, poultry, fish, eggs, beans, nut products    Method for estimating nutritional adequacy: 2015-2020 DRI Guidelines     Clinical Diagnosis:   Excessive Carbohydrate Intake as related to nutrition-knowledge deficit, cravings for sugary foods as evidenced by most recent hbA1c of 6.4% and self-reported high intake of processed carbohydrates.  NUTRITION INTERVENTIONS/PLAN OF CARE    Nutrition Counseling     Nutrition Education     Behavior therapy strategies of self-monitoring food intake, physical activity and goal setting.    Coordination of care via Mail and Medical Record    Client agreed to follow interventions:  Yes       Nutrition-Related Medication Management: Nutrition-related complimentary/alternative medicine      Nutrition Goals:  1. Add/include protein source at meal times--> chicken, fish, [place], beans, eggs, cottage cheese, nuts and nut butters  2. Moving calories to AM/lunch to reduce cravings at night  3. Balanced meals/the plate method:  4. Healthier snack ideas (protein/fiber-rich)  -protein bars (Pure Protein OR [facility] Protein)- no more than 5g added sugar with at least 10g protein  -mixed nuts/peanuts  -various fruits with peanut butter  -Muscle Milk  -Premier Protein Shake  5. Incorporate more whole-grains into daily intake  -whole-wheat bread  -brown rice  -whole-grain cereals  6. Use the plate method:  -1/2 plate non-starchy vegetables  -1/4 plate lean proteins  -1/4 plate whole-grain carbohydrates  7. Consistent meal schedule-try to avoid skipping meals/eating every 3/4 hours.      Patient Education:  Individualized nutrition counseling and education provided on:   Macronutrient Pairing  The Plate Method  Protein Sources  Meal Schedule    Materials Provided:  Patient instructions: nutrition-related recommendations   Handouts supporting dietary recommendations: Added Sugars, Dietitian Materials:   Radiographer, therapeutic , Online resources         NUTRITION MONITORING AND EVALUATION:  Follow-up appointment: telephone/video in 1 month (May 17th at 1030am)      Goals for next appointment:   1. Add/include protein source at meal times--> chicken, fish, [place], beans, eggs, cottage cheese, nuts and nut butters  2. Moving calories to AM/lunch to reduce cravings at night  3. Balanced meals/the plate method:  4. Healthier snack ideas (protein/fiber-rich)  -protein bars (Pure Protein OR [facility] Protein)- no more than 5g added sugar with at least 10g protein  -mixed nuts/peanuts  -various fruits with peanut butter  -Muscle Milk  -Premier Protein Shake  5. Incorporate more whole-grains into daily intake  -whole-wheat bread  -brown rice  -whole-grain cereals  6. Use the plate method:  -1/2 plate non-starchy vegetables  -1/4 plate lean proteins  -1/4 plate whole-grain carbohydrates  7. Consistent meal schedule-try to avoid skipping meals/eating every 3/4 hours.         Number of RD Visits: 3 of 14 visits    Length of visit was: 21 minutes      **Pt has Medicare as insurance. It does not meet criteria for billing services for this nutritional service. Therefore it will be covered under the [facility]**        The patient reports they are currently: at home. I spent 21 minutes on the phone with the patient on the date of service. I spent an additional 4 minutes on pre- and post-visit activities on the date of service.     The patient was physically located in [place] or a state in which I am permitted to provide care. The patient and/or parent/guardian understood that s/he may incur co-pays and cost sharing, and agreed to the telemedicine visit. The visit was reasonable and appropriate under the circumstances given the patient's presentation at the time.    The patient and/or parent/guardian has been advised of the potential risks and limitations of this mode  of treatment (including, but not limited to, the absence of in-person examination) and has agreed to be treated using telemedicine. The patient's/patient's family's questions regarding telemedicine have been answered.     If the visit was completed in an ambulatory setting, the patient and/or parent/guardian has also been advised to contact their provider???s office for worsening conditions, and seek emergency medical treatment and/or call 911 if the patient deems either necessary.      Signed:  Unk Pinto  10/15/2021 9:38 AM

## 2021-10-19 NOTE — Unmapped (Signed)
Pt called RD to tell her he is always hungry and thirsty. Pt reports he is eating multiple servings at one time. Pt reports he does not get full. RD inquired about any lifestyle changes/symptoms including: physical activity, stress/anxiety, polyuria, polydipsia, vision changes,etc. Pt denied any dietary or lifestyle changes. Pt concerned about his increased appetite. Pt reports this has been going on for months and he is not seeing any weight change.    RD will contact  ID provider to further assess symptoms and background.    Time of Intervention: 10 minutes    Unk Pinto, MS, RD, LDN

## 2021-10-23 NOTE — Unmapped (Signed)
Advice

## 2021-10-24 NOTE — Unmapped (Unsigned)
Referral Services Note     Duration of Intervention: 15 minutes    TYPE OF CONTACT: Phone    ASSESSMENT: Sw was contacted by Timothy Tapia [phone] ) from [facility] regarding RW updated information for food services.    INTERVENTION: Sw contacted the pt to discuss the request. Pt states he understands the request and was involved with the process. Sw faxed the information to Timothy Tapia at [phone]     PLAN:  Timothy Tapia will follow up with the pt to schedule food delivery.     Timothy Rower, LCSW-A  [facility] Social Work

## 2021-10-24 NOTE — Unmapped (Signed)
Pt called RD to follow-up on his increase in appetite, and ongoing increased hunger.   It seems that pt's increased hunger is typically at night and correlated with an irregular eating pattern during the day due to work obligations. Pt is also eating low-fiber, low-protein rich foods which maybe causing pt's glucose to fluctuate leading to cravings, increased hunger.    RD provided the following recommendations:  -increase protein intake at breakfast meal  -avoid skipping meals by including a protein shake or protein bar during work hours  -include increased calories in AM to reduce night-time cravings/eating.    Breakfast: hash browns  Lunch: n/a  Dinner:Salad, bbq, hamburger and rice    RD will schedule nutrition visit in 2-3 weeks to reassess appetite and caloric intake.    Time of Intervention: 10 minutes    Unk Pinto, MS, RD, LDN

## 2021-10-29 NOTE — Unmapped (Signed)
Complex Case Management  SUMMARY NOTE    Attempted to contact pt today at Cell number to follow up for Complex Case Management services. Left message to return call.; 1st attempt    Discuss at next visit:  see care plan

## 2021-11-01 NOTE — Unmapped (Unsigned)
The [facility] has made a second and final attempt to reach this patient to refill the following medication:Dupixent.      We have left voicemails on the following phone numbers: [phone], have left voicemail with woman answered phone said patient was not home at the following phone numbers: [phone] and have sent a text message to the following phone numbers: [phone].    Dates contacted: 4/28 5/4  Last scheduled delivery: 4/6    The patient may be at risk of non-compliance with this medication. The patient should call the [facility] at [phone]  Option 4, then Option 2 (all other specialty patients) to refill medication.    Mustapha Colson D Administrator [facility] Specialty Technician

## 2021-11-02 NOTE — Unmapped (Signed)
[facility] Refill Coordination Note    Specialty Medication(s) to be Shipped:   Inflammatory Disorders: Dupixent    Other medication(s) to be shipped: No additional medications requested for fill at this time     Timothy Tapia, DOB: 27-Dec-1965  Phone: [phone] (home) [phone] (work)      All above HIPAA information was verified with patient.     Was a Nurse, learning disability used for this call? No    Completed refill call assessment today to schedule patient's medication shipment from the [facility] [phone]).  All relevant notes have been reviewed.     Specialty medication(s) and dose(s) confirmed: Regimen is correct and unchanged.   Changes to medications: Timothy Tapia reports no changes at this time.  Changes to insurance: No  New side effects reported not previously addressed with a pharmacist or physician: None reported  Questions for the pharmacist: No    Confirmed patient received a Conservation officer, historic buildings and a Surveyor, mining with first shipment. The patient will receive a drug information handout for each medication shipped and additional FDA Medication Guides as required.       DISEASE/MEDICATION-SPECIFIC INFORMATION        For patients on injectable medications: Patient currently has 0 doses left.  Next injection is scheduled for 11/07/21.    SPECIALTY MEDICATION ADHERENCE     Medication Adherence    Patient reported X missed doses in the last month: 0  Specialty Medication: Dupixent  Patient is on additional specialty medications: No  Informant: patient  Reliability of informant: reliable              Were doses missed due to medication being on hold? No     Dupixent 300/2 mg/ml: 0 days of medicine on hand        REFERRAL TO PHARMACIST     Referral to the pharmacist: Not needed      [facility]     Shipping address confirmed in Epic.     Delivery Scheduled: Yes, Expected medication delivery date: 11/06/21.     Medication will be delivered via Same Day Courier to the prescription address in Epic WAM.    Willette Pa   [facility] Specialty Technician

## 2021-11-06 MED FILL — DUPIXENT 300 MG/2 ML SUBCUTANEOUS PEN INJECTOR: SUBCUTANEOUS | 28 days supply | Qty: 8 | Fill #1

## 2021-11-07 NOTE — Unmapped (Signed)
Complex Case Management   Final Call- Program Completion  Summary:   Case Manager spoke with patient today to resolve Complex Case Management services. Patient currently resides at Home.    Patient/caregiver reported he is going to start PT in a couple of weeks. Patient reports he is trying to cut down on sweets and has found some nutrition bars that he enjoys eating.         Plan:   graduate    Care Coordination Note updated in [facility]: Yes    Pt Care Coordination Note:  This patient completed Complex Case Management services on 11/07/2021.     The following barriers were addressed: Housing [employer], Substance Misuse, and Health Literacy    The following interventions were provided: Contact Information Provided, Introduction to ICM, Medication Reconciliation, Advanced Care Planning Education, and Supportive Listening , discussed RD appt, followed up with scheduling dental appt, updating housing authority with current address, reinforced resources given by previous social workers, pain management.     If this patient develops new chronic conditions, new opportunities to resolve barriers, or other areas of need, please send an AMB Referral to Case Management to the Personal Health Advocate Department.    This patient is currently receiving Complex Case Management services.      Primary Case Manager: [place] Lesure  [phone]  Please contact CM for care plan changes, updates or recent discharges.    High Risk Drivers: Multiple Complex Diagnoses  Primary Disease Process: COPD HTN Chronic Pain  Current Residence: [address]  Primary Medical Home: Lynnea Ferrier, MD???s office    Current services: medicare advantage, medicaid direct  Patient's Primary Concern is/goals are: Maintain Health  Barriers: Housing Costs/Unstable Housing, Substance Misuse, and Health Literacy  Strengths: Family connection  Supports: Siblings  Interventions provided: Contact Information Provided, Introduction to ICM, Medication Reconciliation, Advanced Care Planning Education, and Supportive Listening , discussed RD appt, followed up with scheduling dental appt, updating housing authority with current address, reinforced resources given by previous social workers, pain management.   Follow up with ICM Team Member: 2 weeks 4 weeks,

## 2021-11-08 ENCOUNTER — Emergency Department: Payer: MEDICARE

## 2021-11-08 ENCOUNTER — Ambulatory Visit: Payer: MEDICARE

## 2021-11-08 DIAGNOSIS — R42 Dizziness and giddiness: Principal | ICD-10-CM

## 2021-11-08 LAB — CBC W/ AUTO DIFF
BASOPHILS ABSOLUTE COUNT: 0.1 10*9/L (ref 0.0–0.1)
BASOPHILS RELATIVE PERCENT: 0.8 %
EOSINOPHILS ABSOLUTE COUNT: 0 10*9/L (ref 0.0–0.5)
EOSINOPHILS RELATIVE PERCENT: 0.4 %
HEMATOCRIT: 40.8 % (ref 39.0–48.0)
HEMOGLOBIN: 14 g/dL (ref 12.9–16.5)
LYMPHOCYTES ABSOLUTE COUNT: 2.1 10*9/L (ref 1.1–3.6)
LYMPHOCYTES RELATIVE PERCENT: 24.3 %
MEAN CORPUSCULAR HEMOGLOBIN CONC: 34.4 g/dL (ref 32.0–36.0)
MEAN CORPUSCULAR HEMOGLOBIN: 29.7 pg (ref 25.9–32.4)
MEAN CORPUSCULAR VOLUME: 86.3 fL (ref 77.6–95.7)
MEAN PLATELET VOLUME: 7.6 fL (ref 6.8–10.7)
MONOCYTES ABSOLUTE COUNT: 0.6 10*9/L (ref 0.3–0.8)
MONOCYTES RELATIVE PERCENT: 6.8 %
NEUTROPHILS ABSOLUTE COUNT: 6 10*9/L (ref 1.8–7.8)
NEUTROPHILS RELATIVE PERCENT: 67.7 %
PLATELET COUNT: 345 10*9/L (ref 150–450)
RED BLOOD CELL COUNT: 4.73 10*12/L (ref 4.26–5.60)
RED CELL DISTRIBUTION WIDTH: 16.2 % — ABNORMAL HIGH (ref 12.2–15.2)
WBC ADJUSTED: 8.8 10*9/L (ref 3.6–11.2)

## 2021-11-08 LAB — COMPREHENSIVE METABOLIC PANEL
ALBUMIN: 4 g/dL (ref 3.4–5.0)
ALKALINE PHOSPHATASE: 133 U/L — ABNORMAL HIGH (ref 46–116)
ALT (SGPT): 16 U/L (ref 10–49)
ANION GAP: 10 mmol/L (ref 5–14)
AST (SGOT): 14 U/L (ref <=34)
BILIRUBIN TOTAL: 0.4 mg/dL (ref 0.3–1.2)
BLOOD UREA NITROGEN: 6 mg/dL — ABNORMAL LOW (ref 9–23)
BUN / CREAT RATIO: 8
CALCIUM: 9.9 mg/dL (ref 8.7–10.4)
CHLORIDE: 103 mmol/L (ref 98–107)
CO2: 25 mmol/L (ref 20.0–31.0)
CREATININE: 0.76 mg/dL
EGFR CKD-EPI (2021) MALE: >90 mL/min/{1.73_m2} (ref >=60)
GLUCOSE RANDOM: 98 mg/dL (ref 70–179)
POTASSIUM: 4.2 mmol/L (ref 3.4–4.8)
PROTEIN TOTAL: 7.3 g/dL (ref 5.7–8.2)
SODIUM: 138 mmol/L (ref 135–145)

## 2021-11-08 LAB — MAGNESIUM: MAGNESIUM: 1.7 mg/dL (ref 1.6–2.6)

## 2021-11-08 LAB — HIGH SENSITIVITY TROPONIN I - SINGLE: HIGH SENSITIVITY TROPONIN I: 3 ng/L (ref <=53)

## 2021-11-08 MED ORDER — MECLIZINE 25 MG TABLET
ORAL_TABLET | Freq: Three times a day (TID) | ORAL | 0 refills | 10 days | Status: CP | PRN
Start: 2021-11-08 — End: 2021-12-08
  Filled 2021-11-09: qty 30, 10d supply, fill #0

## 2021-11-08 MED ADMIN — acetaminophen (TYLENOL) tablet 1,000 mg: 1000 mg | ORAL | @ 18:00:00 | Stop: 2021-11-08

## 2021-11-08 MED ADMIN — meclizine (ANTIVERT) tablet 25 mg: 25 mg | ORAL | @ 18:00:00 | Stop: 2021-11-08

## 2021-11-08 MED ADMIN — sodium chloride 0.9% (NS) bolus 1,000 mL: 1000 mL | INTRAVENOUS | @ 18:00:00 | Stop: 2021-11-08

## 2021-11-08 NOTE — Unmapped (Signed)
PT BIB ems from work for complaint of dizziness. Pt states it has been going on for 2 days. Reports worse when standing up. Pt denies any nausea or vomiting, no dark or tarry stools. Pt reports drinking enough water and staying hydrated.

## 2021-11-08 NOTE — Unmapped (Signed)
[facility]  Emergency Department Provider Note      ED Clinical Impression      Final diagnoses:   Vertigo (Primary)            ED Course, Assessment and Plan     Initial Clinical Impression:    Nov 08, 2021 1:35 PM   Timothy Tapia is a 56 y.o. male with a history of HIV with last CD4 count 616 on 08/13/2021, Barrett's esophagus, hypertension presents to the emergency department with 1 day history of lightheadedness and vertiginous symptoms upon standing as described below. On exam, Vital signs stable.  Overall well-appearing.  Heart is regular rate rhythm.  Lungs clear auscultation bilaterally.  Pupils are equal round reactive to light bilaterally.  Extraocular muscles are intact. No nystagmus.  Face symmetric. Normal facial sensation.  Normal strength with resisted head turning.  Normal shoulder shrug.  Symmetric palate elevation.  No pronator drift.  Normal finger to nose testing. Normal grip strength.  Normal flexion and extension of the upper extremities.  Normal flexion and extension of the lower extremities.  Normal gait, however patient needs to stop multiple times during ambulation due to onset of vertiginous symptoms..       BP 122/86  - Pulse 75  - Temp 37.2 ??C (99 ??F) (Oral)  - Resp 17  - Ht 165.1 cm (5' 5)  - Wt 72.6 kg (160 lb)  - SpO2 98%  - BMI 26.63 kg/m??     Clinical presentation suggestive of vertigo versus posterior circulation TIA versus CVA.  No other infectious symptoms.  Patient also has a history of GI bleed.  Denies any recent bleeding.  Will obtain basic labs, EKG, troponin to evaluate for other etiologies of lightheadedness and vertigo.  Given patient's persistent symptoms over the past day obtain MRI of the brain to evaluate for posterior circulation stroke.    Discussion of Management with other Physicians, QHP, or Appropriate Source: Pending MRI results  Independent Interpretation of Studies: EKG shows normal sinus rhythm at 67 bpm.  No ST elevations or T wave inversions indicate ischemia.  Normal axis.  Normal intervals  External Records Reviewed: ID clinic visit 08/13/21 showing CD4 count of 616.  Consideration of Admission, Observation, Transfer, or Escalation of Care: Pending MRI results  Social determinants that significantly affected care: Substance abuse with alcohol  Prescription drug(s) considered but not prescribed: N/A  Diagnostic tests considered but not performed: N/A    ED Course as of 11/08/21 1614   Thu Nov 08, 2021   1525 Lab work-up reveals no leukocytosis.  CMP unremarkable.  Troponin negative.  EKG reassuring.  Disposition pending MRI brain without contrast evaluate for posterior circulation ischemia.   1614 At time of signout, MRI brain was pending.  If MRI is negative for acute stroke anticipate discharge home with meclizine.        _____________________________________________________________________    The case was discussed with attending physician who is in agreement with the above assessment and plan    Dictation software was used while making this note. Please excuse any errors made with dictation software.    Additional Medical Decision Making     I have reviewed the vital signs and the nursing notes. Labs and radiology results that were available during my care of the patient were independently reviewed by me and considered in my medical decision making.     I independently visualized the EKG tracing if performed  I independently visualized the  radiology images if performed  I reviewed the patient's prior medical records if available.  Additional history obtained from family if available    History     CHIEF COMPLAINT:   Chief Complaint   Patient presents with    Dizziness       HPI: Timothy Tapia is a 56 y.o. male with a history of HIV with last CD4 count 616 on 08/13/2021, Barrett's esophagus, hypertension presents to the emergency department with 1 day history of lightheadedness and vertiginous symptoms upon standing.  No prior history of similar symptoms.  He has not take any medications for symptoms.  Symptoms improve when he sits down to rest.  No recent trauma.  Patient is not anticoagulated.  States he is otherwise been eating and drinking well and staying hydrated.  Denies fever, chills, neck pain, chest pain, shortness of breath, vomiting, abdominal pain, weakness, numbness, tingling.    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    Visual impairment        Past Surgical History:   Procedure Laterality Date    esophogus surgery      PR COLONOSCOPY W/BIOPSY SINGLE/MULTIPLE  10/11/2013    Procedure: COLONOSCOPY, FLEXIBLE, PROXIMAL TO SPLENIC FLEXURE; WITH BIOPSY, SINGLE OR MULTIPLE;  Surgeon: Teodoro Spray, MD;  [facility]  Service: Gastroenterology    PR COLONOSCOPY W/BIOPSY SINGLE/MULTIPLE N/A 09/29/2014    Procedure: COLONOSCOPY, FLEXIBLE, PROXIMAL TO SPLENIC FLEXURE; WITH BIOPSY, SINGLE OR MULTIPLE;  Surgeon: Bronson Curb, MD;  Location: GI PROCEDURES [facility];  Service: Gastroenterology    PR COLONOSCOPY W/BIOPSY SINGLE/MULTIPLE N/A 09/19/2015    Procedure: COLONOSCOPY, FLEXIBLE, PROXIMAL TO SPLENIC FLEXURE; WITH BIOPSY, SINGLE OR MULTIPLE;  Surgeon: Mayford Knife, MD;  Location: [facility]URES [facility];  Service: Gastroenterology    PR COLSC FLX W/RMVL OF TUMOR POLYP LESION SNARE TQ N/A 09/19/2015    Procedure: COLONOSCOPY FLEX; W/REMOV TUMOR/LES BY SNARE;  Surgeon: Mayford Knife, MD;  Location: [facility];  Service: Gastroenterology    PR ENDOSCOPIC US EXAM, ESOPH N/A 08/10/2015    Procedure: UGI ENDOSCOPY; WITH ENDOSCOPIC ULTRASOUND EXAMINATION LIMITED TO THE ESOPHAGUS;  Surgeon: Vonda Antigua, MD;  Location: [facility];  Service: Gastroenterology    PR UPGI ENDOSCOPY,FN NEEDLE BX,GUIDED N/A 09/22/2014    Procedure: UGI W/TRANSENDOSCOPIC US-GUIDE INTRA/TRANSMURAL NEEDLE ASP/BX (INCL EXAM ESOPHAGUS, STOMACH, DOUDENUM/JEJ);  Surgeon: Ned Grace, MD;  Location: GI [facility]EDURES [facility];  Service: Gastroenterology    PR UPPER GI ENDOSCOPY,BIOPSY N/A 09/19/2015    Procedure: UGI ENDOSCOPY; WITH BIOPSY, SINGLE OR MULTIPLE;  Surgeon: Mayford Knife, MD;  Location: [facility];  Service: Gastroenterology    PR UPPER GI ENDOSCOPY,BIOPSY N/A 07/14/2017    Procedure: UGI ENDOSCOPY; WITH BIOPSY, SINGLE OR MULTIPLE;  Surgeon: Alfred Levins, MD;  Location: [facility] PROCEDURES [facility];  Service: Gastroenterology       MEDICATIONS:  Current Facility-Administered Medications:     triamcinolone acetonide (KENALOG-40) injection 40 mg, 40 mg, Intra-articular, Once, Evaristo B[place], [place]    Current Outpatient Medications:     amLODIPine (NORVASC) 5 MG tablet, Take 1 tablet (5 mg total) by mouth daily., Disp: 30 tablet, Rfl: 3    betamethasone, augmented, (DIPROLENE) 0.05 % ointment, Apply twice daily to active areas until smooth, Disp: 150 g, Rfl: 5    cetirizine (ZYRTEC) 10 MG tablet, TAKE ONE TABLET BY MOUTH EVERY DAY, Disp: 30 tablet, Rfl: 2    clotrimazole (MYCELEX) 10 mg troche, Take 1 tablet (10 mg total) by mouth five (5) times a day., Disp: 70 Troche, Rfl: 0    CREON 24,000-76,000 -120,000 unit CpDR delayed release capsule, TAKE 1 CAPSULE BY MOUTH THREE TIMES DAILY WITH A MEAL, Disp: 90 capsule, Rfl: 5    dupilumab 300 mg/2 mL PnIj, Inject the contents of 1 pen (300 mg) under the skin once a week., Disp: 8 mL, Rfl: 6    empty container Misc, Use as directed to dispose of Dupixent pens., Disp: 1 each, Rfl: 2    enalapril (VASOTEC) 20 MG tablet, TAKE ONE TABLET BY MOUTH EVERY DAY, Disp: 90 tablet, Rfl: 2 gabapentin (NEURONTIN) 300 MG capsule, Take 3 capsules (900 mg total) by mouth two (2) times a day., Disp: 270 capsule, Rfl: 6    halobetasol (ULTRAVATE) 0.05 % ointment, APPLY TO AFFECTED AREA TWICE A DAY AS NEEDED AVOID FACE AND FOLDS, Disp: 30 g, Rfl: 1    hydrOXYzine (ATARAX) 25 MG tablet, Take 1 tablet (25 mg total) by mouth at bedtime. Prn pruritus, Disp: 30 tablet, Rfl: 0    ivermectin (STROMECTOL) 3 mg Tab, Take 5 tablets by mouth once today and then take another 5 tablets by mouth 1 week later, Disp: 10 tablet, Rfl: 0    Magic Mouthwash (nystatin/diphenhydramine/mylanta) Oral Mixture, Take 10 mL by mouth every six (6) hours as needed. Swish 30 seconds, gargle, spit as needed., Disp: 240 mL, Rfl: 0    meclizine (ANTIVERT) 25 mg tablet, Take 1 tablet (25 mg total) by mouth Three (3) times a day as needed., Disp: 30 tablet, Rfl: 0    methylPREDNISolone (MEDROL DOSEPACK) 4 mg tablet, follow package directions, Disp: 1 each, Rfl: 0    mirtazapine (REMERON) 30 MG tablet, TAKE ONE TABLET BY MOUTH EVERY DAY, Disp: 30 tablet, Rfl: 6    pantoprazole (PROTONIX) 20 MG tablet, Take 1 tablet (20 mg total) by mouth daily., Disp: 90 tablet, Rfl: 3    permethrin (ELIMITE) 5 % cream, Apply to entire body for 8 to 14 hours then rinse off, then repeat 1 week later, Disp: 60 g, Rfl: 0    PREZCOBIX 800-150 mg-mg tablet, TAKE ONE TABLET BY MOUTH DAILY, Disp: 30 tablet, Rfl: 6    TIVICAY 50 mg TABLET, TAKE ONE TABLET BY MOUTH DAILY, Disp: 30 tablet, Rfl: 6    triamcinolone (KENALOG) 0.1 % ointment, Apply to itchy spots 2 times daily. For elbows at night time, apply medicine and then cover with saran wrap., Disp: 454 g, Rfl: 0    VENTOLIN HFA 90 mcg/actuation inhaler, INHALE 2 PUFFS UP TO FOUR TIMES DAILY AS NEEDED, Disp: 18 g, Rfl: 11    ALLERGIES:   Sulfa (sulfonamide antibiotics) and Sulfur    SOCIAL HISTORY:   Social History     Tobacco Use    Smoking status: Every Day     Packs/day: 0.25     Years:  27.00     Pack years: 6.75 Types: Cigarettes     Start date: 06/28/1993    Smokeless tobacco: Never    Tobacco comments:     1 pack lasts 4 days; referred to [facility]; Taking nicorette gum   Substance Use Topics    Alcohol use: Not Currently     Alcohol/week: 3.0 standard drinks     Types: 3 Cans of beer per week     Comment: Na       FAMILY HISTORY:  Family History   Problem Relation Age of Onset    Hypertension Mother     Diabetes Mother     COPD Father     Pancreatic cancer Neg Hx     Pancreatitis Neg Hx     Melanoma Neg Hx     Basal cell carcinoma Neg Hx     Squamous cell carcinoma Neg Hx           Physical Exam     VITAL SIGNS:    BP 122/86  - Pulse 75  - Temp 37.2 ??C (99 ??F) (Oral)  - Resp 17  - Ht 165.1 cm (5' 5)  - Wt 72.6 kg (160 lb)  - SpO2 98%  - BMI 26.63 kg/m??     Constitutional: Alert and oriented. Well appearing and in no distress.  Eyes: Conjunctivae are normal.  ENT       Head: Normocephalic and atraumatic.       Nose: No congestion.       Mouth/Throat: Mucous membranes are moist.       Neck: No stridor.  Cardiovascular: Normal rate, regular rhythm. 2+ radial pulses equal bilaterally. <2 second cap refill.  Respiratory: Normal respiratory effort. Breath sounds are normal.  Gastrointestinal: Soft and nontender.   Genitourinary: No suprapubic tenderness  Musculoskeletal: Normal range of motion in all extremities.   Neurologic: Normal speech and language. Pupils are equal round reactive to light bilaterally.  Extraocular muscles are intact. No nystagmus.  Face symmetric. Normal facial sensation.  Normal strength with resisted head turning.  Normal shoulder shrug.  Symmetric palate elevation.  No pronator drift.  Normal finger to nose testing. Normal grip strength.  Normal flexion and extension of the upper extremities.  Normal flexion and extension of the lower extremities.  Normal gait, however patient needs to stop multiple times during ambulation due to onset of vertiginous symptoms.  Skin: Skin is warm, dry. No rash noted.  Psychiatric: Mood and affect are normal. Speech and behavior are normal.         Radiology     MRI brain without contrast    (Results Pending)     ECG 12 Lead    Result Date: 11/08/2021  NORMAL SINUS RHYTHM WITH SINUS ARRHYTHMIA NORMAL ECG WHEN COMPARED WITH ECG OF 04-Apr-2020 13:07, NO SIGNIFICANT CHANGE WAS FOUND          Labs     Labs Reviewed   COMPREHENSIVE METABOLIC PANEL - Abnormal; Notable for the following components:       Result Value    BUN 6 (*)     Alkaline Phosphatase 133 (*)     All other components within normal limits   CBC W/ AUTO DIFF - Abnormal; Notable for the following components:    RDW 16.2 (*)     Anisocytosis Slight (*)     All other components within normal limits   HIGH SENSITIVITY TROPONIN I - SINGLE - Normal   MAGNESIUM -  Normal   CBC W/ DIFFERENTIAL    Narrative:     The following orders were created for panel order CBC w/ Differential.                  Procedure                               Abnormality         Status                                     ---------                               -----------         ------                                     CBC w/ [id number]]         Abnormal            Final result                                                 Please view results for these tests on the individual orders.       Pertinent labs & imaging results that were available during my care of the patient were reviewed by me and considered in my medical decision making (see chart for details).    Please note- This chart has been created using [employer]. Chart creation errors have been sought, but may not always be located and such creation errors, especially pronoun confusion, do NOT reflect on the standard of medical care.       Jadalynn Burr Deberah Castle, MD  Resident  11/08/21 [phone]

## 2021-11-09 MED ORDER — AMLODIPINE 5 MG TABLET
ORAL_TABLET | Freq: Every day | ORAL | 0 refills | 30 days | Status: CP
Start: 2021-11-09 — End: ?

## 2021-11-09 NOTE — Unmapped (Signed)
[facility] ED Provider Progress Note      ED FINAL IMPRESSION:     Final diagnoses:   Vertigo (Primary)       ENCOUNTER SUMMARY & PLAN:     TIME OF CARE ASSUMED: Nov 08, 2021 5:59 PM    PREVIOUS PROVIDER: Dr. Deatra Robinson SUMMARY: Briefly, Timothy Tapia is a 56 y.o. male with pmh of well-controlled HIV, Barrett's esophagus, hypertension who presents with 1 day of dizziness.  See previous providers note for further exhorted and exam details.    At time of signout, MRI brain pending.      ED COURSE:     Vitals:    11/08/21 1326 11/08/21 1721   BP: 122/86 138/80   Pulse: 75 69   Resp: 17 19   Temp: 37.2 ??C (99 ??F) 36.6 ??C (97.9 ??F)   TempSrc: Oral Oral   SpO2: 98% 98%   Weight: 72.6 kg (160 lb)    Height: 165.1 cm (5' 5)         MRI brain without evidence of acute infarct.  On reassessment, patient states that his dizziness is significantly improved.  He has a normal steady gait and is ambulating around the emergency department eager for discharge.  Will discharge home with prescription for meclizine.  Advised PCP follow-up within the next several days.  Patient will require a ride home via case management.  Return precautions discussed and outlined in discharge instructions.  Patient is agreeable with this plan and amenable to discharge.    Konrad Penta, MD  [facility] Emergency Medicine

## 2021-11-09 NOTE — Unmapped (Signed)
Medication Requested: Amlodipine      Last Office Visit: 08/29/2021  Next Office Visit: 11/14/2021  Per Provider Note: amLODIPine (NORVASC) 5 MG tablet Take 1 tablet (5 mg total) by mouth daily. 30 tablet 3     Standing order protocol requirements met?: Yes    Sent to: Pharmacy per protocol    Days Supply Given: 30 days  Number of Refills: 0

## 2021-11-12 NOTE — Unmapped (Signed)
Made a appointment for him to see Grayland Ormond tomorrow.

## 2021-11-12 NOTE — Unmapped (Signed)
-----   Message from Ruthe Mannan sent at 11/12/2021  8:58 AM EDT -----  Regarding: Patient Call Back  Incoming Call  Caller: [name]  Best callback number: [phone]  Reason for call: Dizzy Spells    Pt stated that he has been having dizzy spells for the past week. He does not have a PCP and would like to be seen.     Mia

## 2021-11-13 ENCOUNTER — Ambulatory Visit: Admit: 2021-11-13 | Discharge: 2021-11-14 | Payer: MEDICARE

## 2021-11-13 DIAGNOSIS — E119 Type 2 diabetes mellitus without complications: Principal | ICD-10-CM

## 2021-11-13 DIAGNOSIS — R634 Abnormal weight loss: Principal | ICD-10-CM

## 2021-11-13 DIAGNOSIS — B20 Human immunodeficiency virus [HIV] disease: Principal | ICD-10-CM

## 2021-11-13 LAB — HEMOGLOBIN A1C
ESTIMATED AVERAGE GLUCOSE: 154 mg/dL
HEMOGLOBIN A1C: 7 % — ABNORMAL HIGH (ref 4.8–5.6)

## 2021-11-13 LAB — TSH: THYROID STIMULATING HORMONE: 0.949 u[IU]/mL (ref 0.550–4.780)

## 2021-11-13 NOTE — Unmapped (Signed)
Assessment/Plan:      Timothy Tapia, a 56 y.o. male seen today for urgent evaluation of dizziness and weight loss.    Plan:    Vertigo  - Seen in the ER on 5/11 due to intermittent dizziness with bending over or standing up. Lab work-up unremarkable and EKG reassuring. MRI brain without acute infarct- mild chronic small vessel changes. He is taking Meclizine with some improvement, although still with intermittent dizziness which usually lasts less for 15-20 seconds and is improved by sitting/closing eyes. He denies any new symptoms (no headaches, vision change, weakness, numbness, falls. No hearing changes.   - His neurologic exam is normal in clinic. He reports some dizziness with lying back on the exam table and with bending over. BP is WNL (123/74) in clinic, although orthostatic hypotension also on differential given provoking factors of his dizziness.   - Continue meclizine prn for symptoms. Provided with [facility] [employer] handout and reviewed proper technique.  - Discussed warning signs of more serious etiology (such as CVA) and to seek immediate evaluation for worsening dizziness or new vision changes, numbness/weakness, syncope.     Weight loss  - He has 10# weight loss since March (156-->146# today, suspect weight of 160# in ER was not accurate). He has been working with our dietician, but is concerned by this weight loss. He denies any fever, night sweats, lymphadenopathy or any localizing symptoms (no difficulty swallowing, no respiratory or GI symptoms). He reports strong appetite.   - Discussed possible etiologies, including worsening diabetes (although denies polyuria), dietary changes, infection, malignancy, hormonal changes. His CD4 count is >600 and he is virally suppressed, so low concern for opportunistic infection.   - Labs from recent ER visit reassuring with unremarkable CBC, CMP. Will check HgA1C and TSH today as well as HIV RNA to ensure he is suppressed.   - Discussed updating age appropriate cancer screenings- Colonoscopy in 2017 with multiple polyps and recommendation for repeat in 3 years which looks like it hasn't been completed.   - Return in 2 weeks for weight recheck.   -     Colonoscopy; Future  -     Hemoglobin A1c  -     XR Chest 2 views  -     HIV RNA, Quantitative, PCR  -     TSH      HIV  Engaged in care with Dr. Orlene Erm  Fills ART via private insurance.   Lab Results   Component Value Date    ACD4 616 08/13/2021    HIVCP <20 (H) 11/13/2021    HIVRS Detected (A) 11/13/2021     Continue current therapy.  HIV RNA today .  Encouraged continued excellent ARV adherence.    Immunization History   Administered Date(s) Administered    COVID-19 VACC,MRNA,(PFIZER)(PF) 11/18/2019, 11/18/2019, 12/09/2019, 12/09/2019, 06/12/2020    INFLUENZA TIV (TRI) PF (IM) 04/27/2007, 05/10/2008, 04/03/2009, 04/02/2010, 04/19/2011, 03/30/2012    Influenza Vaccine Quad (IIV4 PF) 38mo+ injectable 04/02/2013, 05/03/2014, 03/20/2015    PNEUMOCOCCAL POLYSACCHARIDE 23 08/16/2002, 08/16/2002, 11/30/2007    PPD Test 04/27/2007, 04/26/2008, 04/03/2009, 07/20/2010    Pneumococcal Conjugate 13-Valent 07/27/2012    SHINGRIX-ZOSTER VACCINE (HZV), RECOMBINANT,SUB-UNIT,ADJUVANTED IM 10/07/2018    TD(TDVAX),ADSORBED,2LF(IM)(PF) 12/25/2015    TdaP 03/17/2006, 03/17/2006     I personally spent 39 minutes face-to-face and non-face-to-face in the care of this patient, which includes all pre, intra, and post visit time on the date of service.  All documented time was specific to the E/M  visit and does not include any procedures that may have been performed.    Disposition  Return to clinic 3-4 months or sooner if needed.    Lahoma Rocker, PA-C  [facility]   [address]  [place], [place].  [address]  Phone: [phone]   Fax: [phone]          Subjective:      Chief Complaint   HIV followup    HPI  Urgent visit for Timothy Tapia, a 56 y.o. male presenting for dizziness.      [name] reports that last week he started experiencing dizziness (felt like he and the room were spinning). It started when he bent over and then got up. The dizziness would resolve in less than 30 seconds then return if he moved around. Thursday was at work and almost passed out, so called EMS and went to the ER. He had a work-up in the ER including labs, EKG, and MRI brain that were reassuring without any signs of CVA. He was given meclizine, which he thinks helps a bit. He was back at work yesterday and the symptoms still occur with lots of movement or with bending over or lying back.     In triage today his weight was 146, which was alarming as he often weighs in the mid 150s to 160. He reports his  appetite is good and he is eating 5-6 times a day. No problems swallowing. Staying hydrated. His pants feel a little loose over the last week, although he didn't notice any obvious weight loss until he saw his weight today in clinic. He denies any fevers. He will have some sweating at night if the air unit is off, but no drenching sweats. No diarrhea, constipation, cough, shortness of breath.    He usually urinates 2-3/day and 1/night. He drinks 3-4 16 oz bottles of water daily.      He started smoking age [age]- 1/2 pack or less for  most of this smoking history, although smoked 1ppd for 2-3 years.   No other substance use currently.     Past Medical History:   Diagnosis Date    Acute pancreatitis     Barrett esophagus     Black stool 07/08/2017    Bronchitis     Colon polyp     Eczema     Gastroesophageal reflux disease     HIV disease (CMS-HCC)     Hypertension     Lack of access to transportation     Pancreatic pseudocyst     Thrombus     IMV thrombus r/t colitis & pancreatitis 10/07/13    Visual impairment        Medications and Allergies   Reviewed and updated today. See bottom of this visit's encounter summary for details.  Current Outpatient Medications on File Prior to Visit   Medication Sig    amLODIPine (NORVASC) 5 MG tablet Take 1 tablet (5 mg total) by mouth daily.    betamethasone, augmented, (DIPROLENE) 0.05 % ointment Apply twice daily to active areas until smooth    cetirizine (ZYRTEC) 10 MG tablet TAKE ONE TABLET BY MOUTH EVERY DAY    clotrimazole (MYCELEX) 10 mg troche Take 1 tablet (10 mg total) by mouth five (5) times a day.    CREON 24,000-76,000 -120,000 unit CpDR delayed release capsule TAKE 1 CAPSULE BY MOUTH THREE TIMES DAILY WITH A MEAL    dupilumab 300 mg/2 mL PnIj Inject the  contents of 1 pen (300 mg) under the skin once a week.    empty container Misc Use as directed to dispose of Dupixent pens.    enalapril (VASOTEC) 20 MG tablet TAKE ONE TABLET BY MOUTH EVERY DAY    gabapentin (NEURONTIN) 300 MG capsule Take 3 capsules (900 mg total) by mouth two (2) times a day.    halobetasol (ULTRAVATE) 0.05 % ointment APPLY TO AFFECTED AREA TWICE A DAY AS NEEDED AVOID FACE AND FOLDS    hydrOXYzine (ATARAX) 25 MG tablet Take 1 tablet (25 mg total) by mouth at bedtime. Prn pruritus    ivermectin (STROMECTOL) 3 mg Tab Take 5 tablets by mouth once today and then take another 5 tablets by mouth 1 week later    Magic Mouthwash (nystatin/diphenhydramine/mylanta) Oral Mixture Take 10 mL by mouth every six (6) hours as needed. Swish 30 seconds, gargle, spit as needed.    meclizine (ANTIVERT) 25 mg tablet Take 1 tablet (25 mg total) by mouth Three (3) times a day as needed.    methylPREDNISolone (MEDROL DOSEPACK) 4 mg tablet follow package directions    mirtazapine (REMERON) 30 MG tablet TAKE ONE TABLET BY MOUTH EVERY DAY    pantoprazole (PROTONIX) 20 MG tablet Take 1 tablet (20 mg total) by mouth daily.    permethrin (ELIMITE) 5 % cream Apply to entire body for 8 to 14 hours then rinse off, then repeat 1 week later    PREZCOBIX 800-150 mg-mg tablet TAKE ONE TABLET BY MOUTH DAILY    TIVICAY 50 mg TABLET TAKE ONE TABLET BY MOUTH DAILY    triamcinolone (KENALOG) 0.1 % ointment Apply to itchy spots 2 times daily. For elbows at night time, apply medicine and then cover with saran wrap.    VENTOLIN HFA 90 mcg/actuation inhaler INHALE 2 PUFFS UP TO FOUR TIMES DAILY AS NEEDED    [EXPIRED] halobetasol (ULTRAVATE) 0.05 % ointment Apply topically Two (2) times a day. To active areas until smooth     Current Facility-Administered Medications on File Prior to Visit   Medication    triamcinolone acetonide (KENALOG-40) injection 40 mg       Allergies   Allergen Reactions    Sulfa (Sulfonamide Antibiotics) Hives    Sulfur Hives       Social History  Social History     Tobacco Use    Smoking status: Every Day     Packs/day: 0.25     Years: 27.00     Pack years: 6.75     Types: Cigarettes     Start date: 06/28/1993    Smokeless tobacco: Never    Tobacco comments:     1 pack lasts 4 days; referred to [facility]; Taking nicorette gum   Substance Use Topics    Alcohol use: Not Currently     Alcohol/week: 3.0 standard drinks     Types: 3 Cans of beer per week     Comment: Na       Review of Systems  As per HPI. Remainder of 10 systems reviewed, negative.        Objective:      BP 123/74 (BP Site: L Arm, BP Position: Sitting, BP Cuff Size: Medium)  - Pulse 83  - Temp 37 ??C (98.6 ??F) (Temporal)  - Ht 162.6 cm (5' 4)  - Wt 66.4 kg (146 lb 6.4 oz)  - BMI 25.13 kg/m??     Physical Exam  Vitals reviewed.   Constitutional:  Appearance: Normal appearance.   HENT:      Mouth/Throat:      Mouth: Mucous membranes are moist.      Pharynx: Oropharynx is clear.   Cardiovascular:      Rate and Rhythm: Normal rate and regular rhythm.   Pulmonary:      Effort: Pulmonary effort is normal.      Breath sounds: Normal breath sounds.   Lymphadenopathy:      Head:      Right side of head: No submental, submandibular, tonsillar, preauricular, posterior auricular or occipital adenopathy.      Left side of head: No submental, submandibular, tonsillar, preauricular, posterior auricular or occipital adenopathy.      Cervical: No cervical adenopathy.   Neurological:      Mental Status: He is alert.      Cranial Nerves: No cranial nerve deficit or facial asymmetry.      Motor: No weakness.      Gait: Gait is intact.   Psychiatric:         Mood and Affect: Mood normal.         Behavior: Behavior normal.         Thought Content: Thought content normal.         Judgment: Judgment normal.       Laboratory Data  Reviewed in Epic today, using Synopsis and Chart Review filters.    Lab Results   Component Value Date    CREATININE 0.76 11/08/2021    QFTTBGOLD Negative 12/25/2015    QTBG NEGATIVE 09/23/2011    HEPCAB Nonreactive 03/23/2019    CHOL 190 06/18/2021    HDL 42 06/18/2021    LDL 115 (H) 06/18/2021    NONHDL 148 (H) 06/18/2021    TRIG 163 (H) 06/18/2021    A1C 7.0 (H) 11/13/2021    FINALDX  04/13/2020     Right lateral thigh, punch  -Epidermal acanthosis with focal spongiosis and prominent papillary dermal fibrosis. (See comment.)                  _____________________________________________________________________

## 2021-11-13 NOTE — Unmapped (Signed)
Referral Services Note     Duration of Intervention: 15 minutes    TYPE OF CONTACT: Face to Face - In Person    ASSESSMENT: Pt inquired if SW could fax over documentation on his behalf to DSS in [facility].     INTERVENTION:  SW received paperwork from pt.    PLAN: SW will fax over documentation at a later date.    Timothy Tapia, MSW  [facility]

## 2021-11-13 NOTE — Unmapped (Signed)
-   For your dizziness, continue Meclizine every day and you can try the maneuvers attached to see if this helps.  - If you have worsening dizziness, any weakness or numbness or pass out you should go back to the ER.    - For your weight loss, we are going to do some labs today and an Xray. I'd also like to make sure you repeat your colonoscopy, which is overdue.    - Please return in 2 weeks to recheck your weight.

## 2021-11-14 ENCOUNTER — Institutional Professional Consult (permissible substitution): Admit: 2021-11-14 | Discharge: 2021-11-15 | Payer: MEDICARE | Attending: Registered" | Primary: Registered"

## 2021-11-14 DIAGNOSIS — R634 Abnormal weight loss: Principal | ICD-10-CM

## 2021-11-14 LAB — HIV RNA, QUANTITATIVE, PCR
HIV RNA QNT RSLT: DETECTED — AB
HIV RNA: <20 {copies}/mL — ABNORMAL HIGH (ref <0)

## 2021-11-14 NOTE — Unmapped (Signed)
Referral Services Note     Duration of Intervention: 15 minutes    TYPE OF CONTACT: Fax    ASSESSMENT: Pt has to turn in FNS Recertification form to DSS in [facility].     INTERVENTION:  SW faxed over documentation to Bertram Savin [phone]) at [facility] in [facility].     PLAN:  SW informed pt , pt will update SW with any other questions or concerns.     Timothy Tapia, MSW  [facility]

## 2021-11-14 NOTE — Unmapped (Signed)
[facility]  Adult Medical Nutrition Therapy - Nutrition Follow-Up Visit  Date: 11/14/2021    Patient Name:             Timothy Tapia    Date of Birth / Age / Birth Gender:            [birth year] / 56 y.o. / Male    Referring MD or Clinic:             Minerva Fester, MD      Interpreter:            No      Reason for Referral:   General, healthful diet order yes: weight management, weight reduction and diabetes      Nutritional Concerns/Comments:  56 y.o. male seeking nutritional counseling for weight management, weight reduction and diabetes.    Pt reports to follow-up nutrition visit with BMI of 25.13 kg/m2 (overweight) with a history of COPD, GERD, ETOH abuse, DVT, HTN, barrett esophagus seeking nutritional counseling to mitigate elevated glucose levels.      Since follow-up nutrition visit, pt has lost ~12 lbs in 2 months, unintentionally. Pt denies loss of appetite, early satiety or GI distress. Pt reports increased appetite and hungry all the time. Pt hbA1c% has also increased to 7.0%. Pt concerned with weight loss and elevated glucose levels--wants to be on a medication. RD will contact pt provider.    At today's visit, RD re-educated pt on the following:  -macronutrient pairing/food pairing  -caloric dense additives  -nutrition supplementation shakes between meals  -reduction of processed carbohydrates/added sugars  -lean protein sources     DYSGLYCEMIA: Has increased to 7.0%.  Pt reports polydipsia, increased appetite and thirst. RD encouraged reduction of processed carbohydrate intake and increasing protein/fiber intake with stable meal times.    Component Ref Range & Units 11/13/21 1622 08/13/21 1002 12/11/20 1109 06/12/20 1129     Hemoglobin A1C 4.8 - 5.6 % 7.0 High  6.5 High  5.9 High  6.5 High     Estimated Average Glucose mg/dL [phone]       DIET: For breakfast, pt will have eggs, bacon and toast. Pt drinks Muscle Milk BID between meals. For dinner, pt will have beef tips, gravy and rice. Beverages include no sugar added flavored water.    PHYSICAL ACTIVITY: Pt walks to and from work daily (30 minutes).    FOOD INSECURITY: Pt screens negative. Pt receives SNAP benefits ($200). Pt receives food from [employer].    Anthropometric Data:   Height:   5'4   Weight:   146 lbs   BMI:   25.13 kg/m2                Weight History:               Wt Readings from Last 6 Encounters:   11/13/21 66.4 kg (146 lb 6.4 oz)   11/08/21 72.6 kg (160 lb)   09/14/21 70.8 kg (156 lb)   09/13/21 71.7 kg (158 lb 1.6 oz)   09/10/21 69.8 kg (153 lb 12.8 oz)   08/29/21 70 kg (154 lb 6.4 oz)       Usual body weight: 160 lbs  Ideal Body Weight:  130 lbs  Goal weight: 133 lbs      Physical Findings Data:               BP Today: n/a (telephone visit)  BP History:                 BP Readings from Last 6 Encounters:   11/13/21 123/74   11/08/21 138/80   09/14/21 138/78   08/29/21 120/80   08/13/21 122/73   07/13/21 126/76       Biochemical Data:              A1C:    HGB A1C, POC   Date Value Ref Range Status   03/31/2020 8.7 (H) <7.0 % Final     Comment:     A1c Glycemic Goal: <7.0%     **Goals should be individualized; more or less stringent A1c glycemic goals may be appropriate for individual patients.      (Adopted from: 2020 ADA Standards of Medical Care In Diabetes)  Point of Care A1c testing is not FDA-approved for the diagnosis of Diabetes.     Hemoglobin A1C   Date Value Ref Range Status   11/13/2021 7.0 (H) 4.8 - 5.6 % Final                 Estimated Blood Glucose:   Lab Results   Component Value Date    Estimated Average Glucose 154 11/13/2021    EST AVERAGE GLUCOSE, POC 203 03/31/2020                Total Cholesterol:   Cholesterol   Date Value Ref Range Status   06/18/2021 190 <=200 mg/dL Final     Cholesterol, Total   Date Value Ref Range Status   02/15/2013 208 (H) 100 - 199 mg/dL Final                Trig:   Triglycerides   Date Value Ref Range Status   06/18/2021 163 (H) 0 - 150 mg/dL Final   16/04/9603 540 1 - 149 mg/dL Final                 HDL:   HDL   Date Value Ref Range Status   06/18/2021 42 40 - 60 mg/dL Final   98/05/9146 79 (H) 40 - 59 mg/dL Final                  LDL:   LDL Calculated   Date Value Ref Range Status   06/18/2021 115 (H) 40 - 99 mg/dL Final     Comment:     NHLBI Recommended Ranges, LDL Cholesterol, for Adults (20+yrs) (ATPIII), mg/dL  Optimal              <829  Near Optimal        100-129  Borderline High     130-159  High                160-189  Very High            >=190  NHLBI Recommended Ranges, LDL Cholesterol, for Children (2-19 yrs), mg/dL  Desirable            <562  Borderline High     110-129  High                 >=130       LDL Cholesterol, Calculated   Date Value Ref Range Status   02/15/2013 100 mg/dL Final     Comment:     :  ADULTS (20 years or older)  Optimal         <100  Near Optimal  100-129  Borderline High 130-159  High            160-189  Very High       >=190  CHILDREN (2-19 years)  Desirable       <110  Borderline High 110-129  High            >/= 130       Current Medications, Herbs, Supplements:    Current Outpatient Medications:     amLODIPine (NORVASC) 5 MG tablet, Take 1 tablet (5 mg total) by mouth daily., Disp: 30 tablet, Rfl: 0    betamethasone, augmented, (DIPROLENE) 0.05 % ointment, Apply twice daily to active areas until smooth, Disp: 150 g, Rfl: 5    cetirizine (ZYRTEC) 10 MG tablet, TAKE ONE TABLET BY MOUTH EVERY DAY, Disp: 30 tablet, Rfl: 2    clotrimazole (MYCELEX) 10 mg troche, Take 1 tablet (10 mg total) by mouth five (5) times a day., Disp: 70 Troche, Rfl: 0    CREON 24,000-76,000 -120,000 unit CpDR delayed release capsule, TAKE 1 CAPSULE BY MOUTH THREE TIMES DAILY WITH A MEAL, Disp: 90 capsule, Rfl: 5    dupilumab 300 mg/2 mL PnIj, Inject the contents of 1 pen (300 mg) under the skin once a week., Disp: 8 mL, Rfl: 6    empty container Misc, Use as directed to dispose of Dupixent pens., Disp: 1 each, Rfl: 2 enalapril (VASOTEC) 20 MG tablet, TAKE ONE TABLET BY MOUTH EVERY DAY, Disp: 90 tablet, Rfl: 2    gabapentin (NEURONTIN) 300 MG capsule, Take 3 capsules (900 mg total) by mouth two (2) times a day., Disp: 270 capsule, Rfl: 6    halobetasol (ULTRAVATE) 0.05 % ointment, APPLY TO AFFECTED AREA TWICE A DAY AS NEEDED AVOID FACE AND FOLDS, Disp: 30 g, Rfl: 1    hydrOXYzine (ATARAX) 25 MG tablet, Take 1 tablet (25 mg total) by mouth at bedtime. Prn pruritus, Disp: 30 tablet, Rfl: 0    ivermectin (STROMECTOL) 3 mg Tab, Take 5 tablets by mouth once today and then take another 5 tablets by mouth 1 week later, Disp: 10 tablet, Rfl: 0    Magic Mouthwash (nystatin/diphenhydramine/mylanta) Oral Mixture, Take 10 mL by mouth every six (6) hours as needed. Swish 30 seconds, gargle, spit as needed., Disp: 240 mL, Rfl: 0    meclizine (ANTIVERT) 25 mg tablet, Take 1 tablet (25 mg total) by mouth Three (3) times a day as needed., Disp: 30 tablet, Rfl: 0    methylPREDNISolone (MEDROL DOSEPACK) 4 mg tablet, follow package directions, Disp: 1 each, Rfl: 0    mirtazapine (REMERON) 30 MG tablet, TAKE ONE TABLET BY MOUTH EVERY DAY, Disp: 30 tablet, Rfl: 6    pantoprazole (PROTONIX) 20 MG tablet, Take 1 tablet (20 mg total) by mouth daily., Disp: 90 tablet, Rfl: 3    permethrin (ELIMITE) 5 % cream, Apply to entire body for 8 to 14 hours then rinse off, then repeat 1 week later, Disp: 60 g, Rfl: 0    PREZCOBIX 800-150 mg-mg tablet, TAKE ONE TABLET BY MOUTH DAILY, Disp: 30 tablet, Rfl: 6    TIVICAY 50 mg TABLET, TAKE ONE TABLET BY MOUTH DAILY, Disp: 30 tablet, Rfl: 6    triamcinolone (KENALOG) 0.1 % ointment, Apply to itchy spots 2 times daily. 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 For each statement, please tell me whether the statement was often true, sometimes true, or never true for your household in the last month.     1. ???We worried whether our food would run out before we got money to buy more.???   Never True     2. ???The food that we bought just didn't last, and we didn't have money to get more.  Never True       Usual Daily Food Choices:      24-Hour Recall/Usual Intake:  Time Intake   Breakfast Cheese and egg sandwich with hash browns and sausage/bacon   Snack (AM)    Lunch Muscle milk   Snack (PM)    Dinner Beef tips gravy rice, muscle milk   Snack (HS)      Food and Nutrient Intake:  Snacks:  Chips, cookies, chips  Beverages:  Tea, sparking water  Dining Out:  n/a  Cooking Methods: bbq, grilling, baking  Usual Food Choices: fruit, cookies, chips, ice cream   Meal Schedule:  irregular    Behavioral Factors:  Overeating: Denied issues with overeating.   Emotional Eating: No issues noted.   Grazing: Denied issues.   Fast Eating: Denied issues.   Nighttime Eating: Nighttime overeating.    Factors Affecting Food Intake:  Knowledge and Beliefs: He presents with food and nutrition-related knowledge deficit. He has not previously met with dietitian or participated in nutrition program.   Stress/Anxiety: Did not report issues with stress/anxiety.   Sleep Patterns: Did not report issues with sleeping habits and patterns.  Food Safety and Access: He noted limited finances and resources.  He has SNAP/EBT benefits.   Other: n/a        Food Intolerances/Dietary Restrictions:  No known food allergies or food intolerances.     Other GI Issues:  Heartburn    Hunger and Satiety:  Denied issues.       Allergies:  is allergic to sulfa (sulfonamide antibiotics) and sulfur.      Usual Daily Physical Activity Factors: >/=1.0 to <1.4 (Sedentary)  Method for estimating physical activity factor: 2015-2020 DRI Guidelines    Estimated Adherence:  Client self-reported adherence score 8  Method for estimating adherence: Adult Meducation Readiness Ruler      NUTRITION DIAGNOSIS:   Inadequate  energy intake   Inadequate  intake of grains  Inadequate  intake of fruits  Inadequate  intake of vegetables  Inadequate  intake of milk/milk products  Inadequate  intake of meat, poultry, fish, eggs, beans, nut products    Method for estimating nutritional adequacy: 2015-2020 DRI Guidelines     Clinical Diagnosis:   Excessive  Carbohydrate Intake as related to nutrition-knowledge deficit, cravings for sugary foods as evidenced by most recent hbA1c of 7.0% and self-reported high intake of processed carbohydrates.            NUTRITION INTERVENTIONS/PLAN OF CARE    Nutrition Counseling     Nutrition Education     Behavior therapy strategies of self-monitoring food intake, physical activity and goal setting.    Coordination of care via Mail and Medical Record    Client agreed to follow interventions:  Yes       Nutrition-Related Medication Management: Nutrition-related complimentary/alternative medicine      Nutrition Goals:  1. Add/include protein source at meal times--> chicken, fish, [place], beans, eggs, cottage cheese, nuts and nut butters  2. Moving calories to AM/lunch to reduce cravings at night  3. Balanced meals/the plate method:  4. Healthier snack ideas (protein/fiber-rich)  -protein bars (Pure Protein OR [facility] Protein)- no more than 5g added sugar with at least 10g protein  -mixed nuts/peanuts  -various fruits with peanut butter  -Muscle Milk  -Premier Protein Shake  5. Incorporate more whole-grains into daily intake  -whole-wheat bread  -brown rice  -whole-grain cereals  6. Use the plate method:  -1/2 plate non-starchy vegetables  -1/4 plate lean proteins  -1/4 plate whole-grain carbohydrates  7. Consistent meal schedule-try to avoid skipping meals/eating every 3/4 hours.      Patient Education:  Individualized nutrition counseling and education provided on:   Macronutrient Pairing  The Plate Method  Protein Sources  Meal Schedule    Materials Provided:  Patient instructions: nutrition-related recommendations   Handouts supporting dietary recommendations: Added Sugars, Research officer, political party:   Radiographer, therapeutic , Online resources         NUTRITION MONITORING AND EVALUATION:  Follow-up appointment: telephone/video in 4 weeks (12/12/2021 at 1030am)      Goals for next appointment:   1. Add/include protein source at meal times--> chicken, fish, [place], beans, eggs, cottage cheese, nuts and nut butters  2. Moving calories to AM/lunch to reduce cravings at night  3. Balanced meals/the plate method:  4. Healthier snack ideas (protein/fiber-rich)  -protein bars (Pure Protein OR [facility] Protein)- no more than 5g added sugar with at least 10g protein  -mixed nuts/peanuts  -various fruits with peanut butter  -Muscle Milk  -Premier Protein Shake  5. Incorporate more whole-grains into daily intake  -whole-wheat bread  -brown rice  -whole-grain cereals  6. Use the plate method:  -1/2 plate non-starchy vegetables  -1/4 plate lean proteins  -1/4 plate whole-grain carbohydrates  7. Consistent meal schedule-try to avoid skipping meals/eating every 3/4 hours.         Number of RD Visits: 4 of 14 visits    Length of visit was:  14  minutes      **Pt has Medicare as insurance. It does not meet criteria for billing services for this nutritional service. Therefore it will be covered under the [facility]**        The patient reports they are currently: at home. I spent 14 minutes on the phone with the patient on the date of service. I spent an additional 4 minutes on pre- and post-visit activities on the date of service.     The patient was physically located in [place] or a state in which I am permitted to provide care. The patient and/or parent/guardian understood that s/he may incur co-pays and cost sharing, and agreed to  the telemedicine visit. The visit was reasonable and appropriate under the circumstances given the patient's presentation at the time.    The patient and/or parent/guardian has been advised of the potential risks and limitations of this mode of treatment (including, but not limited to, the absence of in-person examination) and has agreed to be treated using telemedicine. The patient's/patient's family's questions regarding telemedicine have been answered.     If the visit was completed in an ambulatory setting, the patient and/or parent/guardian has also been advised to contact their provider???s office for worsening conditions, and seek emergency medical treatment and/or call 911 if the patient deems either necessary.      Signed:  Unk Pinto  11/14/2021 10:34 AM

## 2021-11-14 NOTE — Unmapped (Signed)
Nutrition Goals:  1. Add/include protein source at meal times--> chicken, fish, [place], beans, eggs, cottage cheese, nuts and nut butters  2. Moving calories to AM/lunch to reduce cravings at night  3. Balanced meals/the plate method:  4. Healthier snack ideas (protein/fiber-rich)  -protein bars (Pure Protein OR [facility] Protein)- no more than 5g added sugar with at least 10g protein  -mixed nuts/peanuts  -various fruits with peanut butter  -Muscle Milk  -Premier Protein Shake  5. Incorporate more whole-grains into daily intake  -whole-wheat bread  -brown rice  -whole-grain cereals  6. Use the plate method:  -1/2 plate non-starchy vegetables  -1/4 plate lean proteins  -1/4 plate whole-grain carbohydrates  7. Consistent meal schedule-try to avoid skipping meals/eating every 3/4 hours.

## 2021-11-14 NOTE — Unmapped (Signed)
Timothy Tapia called requesting a work note for May 16th through the 21st. Stated since he was seen yesterday, he has been taking his antivert but he was too dizzy to work today so he left early.   Provider is aware.

## 2021-11-15 NOTE — Unmapped (Signed)
Referral Services Note     Duration of Intervention: 20 minutes    TYPE OF CONTACT: Face to Face - In Person    ASSESSMENT: Pt inquired about DSS receiving FNS recertification form. Pt stated that he would like to provide his medication list with submission of application.     INTERVENTION:  SW provided active listening, validation, empathic feedback, unconditional positive regard, and congruence within the relationship. SW informed pt that the medication list is not required within the application. Pt stated that he would still like to submit the medication documentation. Pt signed an ROI. SW submitted pts information.     PLAN:  SW received confirmation of fax. Pt will work with DSS on further FNS requirements.       Timothy Tapia, MSW  [employer]

## 2021-11-16 ENCOUNTER — Ambulatory Visit: Admit: 2021-11-16 | Payer: MEDICARE

## 2021-11-16 NOTE — Unmapped (Unsigned)
[facility]  Faculty {cccne:94377::New Consultation} Visit Note    REFERRING PROVIDER: Minerva Fester, MD  [address]  [facility]  [id number] Bioinformatics  [place],  [place] [address]    PRIMARY CARE PROVIDER: Lynnea Ferrier, MD    Assessment and Plan:  Timothy Tapia is a 56 y.o. patient with male sex assigned at birth who is seen in consultation at the request of Dr. Servando Snare for evaluation of Barrett's esophagus without dysplasia; Dysphagia, unspecified type.    There are no diagnoses linked to this encounter.  There are no Patient Instructions on file for this visit.    No follow-ups on file.    I personally spent *** minutes face-to-face and non-face-to-face in the care of this patient, which includes all pre, intra, and post visit time on the date of service.  All documented time was specific to the E/M visit and does not include any procedures that may have been performed.     Tangy Drozdowski C. Elizebeth Brooking, MD MPH  Clinical Assistant Professor  Gastroenterology and Hepatology     -------------------------    History of Present Illness:      Timothy Tapia is a 56 y.o. patient with male sex assigned at birth past medical history as below who presents for Barrett's esophagus without dysplasia; Dysphagia, unspecified type.    ***    Past Medical History:   Diagnosis Date   ??? Acute pancreatitis    ??? Barrett esophagus    ??? Black stool 07/08/2017   ??? Bronchitis    ??? Colon polyp    ??? Eczema    ??? Gastroesophageal reflux disease    ??? HIV disease (CMS-HCC)    ??? Hypertension    ??? Lack of access to transportation    ??? Pancreatic pseudocyst    ??? 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 Visual impairment      Past Surgical History:  ??? ***    Current Outpatient Medications on File Prior to Visit   Medication Sig Dispense Refill   ??? amLODIPine (NORVASC) 5 MG tablet Take 1 tablet (5 mg total) by mouth daily. 30 tablet 0   ??? betamethasone, augmented, (DIPROLENE) 0.05 % ointment Apply twice daily to active areas until smooth 150 g 5   ??? cetirizine (ZYRTEC) 10 MG tablet TAKE ONE TABLET BY MOUTH EVERY DAY 30 tablet 2   ??? clotrimazole (MYCELEX) 10 mg troche Take 1 tablet (10 mg total) by mouth five (5) times a day. 70 Troche 0   ??? CREON 24,000-76,000 -120,000 unit CpDR delayed release capsule TAKE 1 CAPSULE BY MOUTH THREE TIMES DAILY WITH A MEAL 90 capsule 5   ??? dupilumab 300 mg/2 mL PnIj Inject the contents of 1 pen (300 mg) under the skin once a week. 8 mL 6   ??? empty container Misc Use as directed to dispose of Dupixent pens. 1 each 2   ??? enalapril (VASOTEC) 20 MG tablet TAKE ONE TABLET BY MOUTH EVERY DAY 90 tablet 2   ??? gabapentin (NEURONTIN) 300 MG capsule Take 3 capsules (900 mg total) by mouth two (2) times a day. 270 capsule 6   ??? halobetasol (ULTRAVATE) 0.05 % ointment APPLY TO AFFECTED AREA TWICE A DAY AS NEEDED AVOID FACE AND FOLDS 30 g 1   ??? [EXPIRED] halobetasol (ULTRAVATE) 0.05 % ointment Apply topically Two (  2) times a day. To active areas until smooth 100 g 4   ??? hydrOXYzine (ATARAX) 25 MG tablet Take 1 tablet (25 mg total) by mouth at bedtime. Prn pruritus 30 tablet 0   ??? ivermectin (STROMECTOL) 3 mg Tab Take 5 tablets by mouth once today and then take another 5 tablets by mouth 1 week later 10 tablet 0   ??? Magic Mouthwash (nystatin/diphenhydramine/mylanta) Oral Mixture Take 10 mL by mouth every six (6) hours as needed. Swish 30 seconds, gargle, spit as needed. 240 mL 0   ??? meclizine (ANTIVERT) 25 mg tablet Take 1 tablet (25 mg total) by mouth Three (3) times a day as needed. 30 tablet 0   ??? methylPREDNISolone (MEDROL DOSEPACK) 4 mg tablet follow package directions 1 each 0   ??? mirtazapine (REMERON) 30 MG tablet TAKE ONE TABLET BY MOUTH EVERY DAY 30 tablet 6   ??? pantoprazole (PROTONIX) 20 MG tablet Take 1 tablet (20 mg total) by mouth daily. 90 tablet 3   ??? permethrin (ELIMITE) 5 % cream Apply to entire body for 8 to 14 hours then rinse off, then repeat 1 week later 60 g 0   ??? PREZCOBIX 800-150 mg-mg tablet TAKE ONE TABLET BY MOUTH DAILY 30 tablet 6   ??? TIVICAY 50 mg TABLET TAKE ONE TABLET BY MOUTH DAILY 30 tablet 6   ??? triamcinolone (KENALOG) 0.1 % ointment Apply to itchy spots 2 times daily. For elbows at night time, apply medicine and then cover with saran wrap. 454 g 0   ??? VENTOLIN HFA 90 mcg/actuation inhaler INHALE 2 PUFFS UP TO FOUR TIMES DAILY AS NEEDED 18 g 11     Current Facility-Administered Medications on File Prior to Visit   Medication Dose Route Frequency Provider Last Rate Last Admin   ??? triamcinolone acetonide (KENALOG-40) injection 40 mg  40 mg Intra-articular Once Evaristo Bury, [place]         Allergies  Reviewed on 11/13/2021      Reactions Comments    Sulfa (sulfonamide Antibiotics) Hives     Sulfur Hives           Family History   Problem Relation Age of Onset   ??? Hypertension Mother    ??? Diabetes Mother    ??? COPD Father    ??? Pancreatic cancer Neg Hx    ??? Pancreatitis Neg Hx    ??? Melanoma Neg Hx    ??? Basal cell carcinoma Neg Hx    ??? Squamous cell carcinoma Neg Hx      Social History     Tobacco Use   ??? Smoking status: Every Day     Packs/day: 0.25     Years: 27.00     Pack years: 6.75     Types: Cigarettes     Start date: 06/28/1993   ??? Smokeless tobacco: Never   ??? Tobacco comments:     1 pack lasts 4 days; referred to [facility]; Taking nicorette gum   Vaping Use   ??? Vaping Use: Never used   Substance Use Topics   ??? Alcohol use: Not Currently     Alcohol/week: 3.0 standard drinks     Types: 3 Cans of beer per week     Comment: Na   ??? 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    Vital Signs: There were no vitals taken for this visit.    Wt Readings from Last 6 Encounters:   11/13/21 66.4 kg (146 lb 6.4 oz) 11/08/21 72.6 kg (160 lb)   09/14/21 70.8 kg (156 lb)   09/13/21 71.7 kg (158 lb 1.6 oz)   09/10/21 69.8 kg (153 lb 12.8 oz)   08/29/21 70 kg (154 lb 6.4 oz)     Review of Systems:  General appearance: Appears well, no distress.  Eyes: Anicteric sclera. No erythema.  ENT: No oral ulcers. Posterior oropharynx unremarkable.  Cardiovascular: RRR without murmurs, heaves, or thrills. No lower extremity edema.  Pulmonary: Normal work of breathing. Acyanotic.  Abdominal: soft, nontender, nondistended, no masses or organomegaly.  Musculoskeletal: No temporal wasting. Normal joints of the hand.  Skin: No jaundice. No rashes.  Neurologic: Alert, oriented, and appropriate.  Psychiatric: Appropriate.    Data Review:  Review of labs found:  Essentially nl CPM and counts 11/08/2021.    Review of endoscopy found:  ?? Upper endoscopy - 07/14/2017 - Nl  ?? Upper endoscopy - 09/19/2015 - possible short BE segment (no BE on biopsies)  ?? Colonoscopy - 09/19/2015 - 7 polyps (3 adenomas)    Review of imaging found:  ?? CXR - 09/14/2021 - Nl  ?? MRI c-spine - Numerous osteophytes    Review of other studies found:  None.

## 2021-11-16 NOTE — Unmapped (Incomplete)
***  -------------------------------------------------------------------------------      If you have any questions or concerns please contact me on MyChart or call my nurse, Selena Batten, at [phone].    For appointments, please call [phone]. If you need an urgent appointment and there is no existing availability please ask me on MyChart or call Kim.    For scheduling GI procedures (Upper endoscopy, colonoscopy, manometry, pH/impedence test), please call the schedulers at [phone].  For scheduling radiology exams call [phone].  You can reach the [facility] lab at [phone].    For emergencies after hours you can call [phone] and ask for the GI medicine fellow on call.    ==============================================

## 2021-11-21 ENCOUNTER — Ambulatory Visit: Admit: 2021-11-21 | Payer: MEDICARE

## 2021-11-21 NOTE — Unmapped (Signed)
[facility] PHYSICAL THERAPY  [facility]  [address] [place], [place] [address]    [phone]    This note is to let you know that Graeden Bitner did not show for their scheduled Physical Therapy Evaluation. Please contact me if you have any questions or concerns.    Thank you for this referral,     Signed: Barron Alvine, PT  11/21/2021 11:42 AM

## 2021-11-21 NOTE — Unmapped (Unsigned)
[facility] [place]  OUTPATIENT PHYSICAL THERAPY  11/21/2021          Patient Name: Timothy Tapia  Date of Birth:1965/11/27  Diagnosis: No diagnosis found.  Referring Provider: Redge Gainer    Date of Onset of Impairment: No date available  Date PT Care Plan Established or Reviewed: No date available  Date PT Treatment Started: No date available     Plan of Care Effective Date: ***  Session Number:  1    ASSESSMENT & PLAN   Assessment/Plan    SUBJECTIVE   Interpreter Use: {JMKINTERP:89310}    History of Present Condition      History of Present Condition/Chief Complaint:       Neck pain [M54.2]  - Primary  Chronic right shoulder pain [M25.511, G89.29]    Subjective:     Patient complains of right shoulder pain. Patient is right-hand dominant. The symptoms began several years ago. Aggravating factors: no known event. Pain is located diffusely throughout the shoulder. Discomfort is described as aching and throbbing. Symptoms are exacerbated by repetitive movements, overhead movements and lying on the shoulder.          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        Hunger vital sign:  1. Within the past 12 months, we worried whether our food would run out before we got money to buy more. {hungeroptions:71899::Never True}  2. Within the past 12 months, the food we bought just didn't last, and we didn't have money to get more. {hungeroptions:71899::Never True}    If patient identifies with either of the above, patient was provided with local food resource guide and non perishables when possible.    OBJECTIVE     ***    TREATMENT RENDERED     Therapeutic Exercise:  *** Minutes   Performed with direct PT demonstration, instruction, supervision, and guidance.   - Education on condition, prognosis, and PT POC  -     HEP Access Code: ***  Next Visit Plan: ***      {Maskspatientvisitor:67937}      Total Treatment Time: *** Minutes                          I attest that I have reviewed the above information.  Signed: Barron Alvine, PT, DPT  11/21/2021 10:31 AM        I reviewed the no-show/attendance policy with the patient and caregiver(s). The patient is aware that they must call to cancel appointments more than 24 hours in advance. They are also aware that if they late cancel or no-show three times, we reserve the right to cancel their remaining appointments. This policy is in place to allow [place] to best serve the needs of our caseload.    If patient returns to clinic with variance in plan of care, then it may be attributable to one or more of the following factors: preferred clinician availability, appointment time request availability, therapy pool appointment availability, major holiday with clinic closure, caregiver availability, patient transportation, conflicting medical appointment, inclement weather, and/or patient illness.    If patient does not return for follow up visit(s) related to this episode of care, this note will serve as their discharge note from Physical Therapy.

## 2021-11-23 ENCOUNTER — Ambulatory Visit: Admit: 2021-11-23 | Discharge: 2021-11-24 | Payer: MEDICARE

## 2021-11-23 NOTE — Unmapped (Signed)
Referral Services Note     Duration of Intervention: 20 minutes    TYPE OF CONTACT: Phone    ASSESSMENT: Pt stated that he needed to identify a optometrist through his insurance. Pt stated that he needed glasses.    INTERVENTION:  SW provided active listening, validation, empathic feedback, unconditional positive regard, and congruence within the relationship.      PLAN:  SW will update pt on findings at a later date.       Chloe Flis, MSW  [facility]

## 2021-11-23 NOTE — Unmapped (Signed)
Diagnosis ICD-10-CM Associated Orders   1. Myopia with astigmatism and presbyopia, bilateral  H52.13     H52.203     H52.4           Glasses updated

## 2021-11-25 DIAGNOSIS — L309 Dermatitis, unspecified: Principal | ICD-10-CM

## 2021-11-25 MED ORDER — BETAMETHASONE, AUGMENTED 0.05 % TOPICAL OINTMENT
5 refills | 0 days
Start: 2021-11-25 — End: ?

## 2021-11-27 ENCOUNTER — Ambulatory Visit: Admit: 2021-11-27 | Payer: MEDICARE

## 2021-11-27 MED ORDER — BETAMETHASONE, AUGMENTED 0.05 % TOPICAL OINTMENT
5 refills | 0.00000 days | Status: CP
Start: 2021-11-27 — End: ?

## 2021-11-27 NOTE — Unmapped (Signed)
Refill request for betamethasone 0.05% ointment.    LOV: 10/11/2021

## 2021-11-28 NOTE — Unmapped (Signed)
Referral Service Attempt    Duration of Intervention: 20 minutes    TYPE OF CONTACT: Text    SW attempted to follow up on provider referral for [facility] for Eye Exam or Glasses     Pt non-responsive to outreach. This is attempt one of two to contact the patient.       SW texted pt the following information:     [facility]  Address: [address], [place], [place] [address]  Phone: [phone]    [facility]  [address] [place]  [place], [place] [address]  Phone: [phone]      [facility] EYE EAR NOSE [facility] THROAT  [address]  [place], [place] [address]  Phone: [phone]      [facility] [phone] [place] [address] [place], [place] [address]  Phone: [phone]    Ellender Hose, MSW  [facility] Social Work

## 2021-11-29 DIAGNOSIS — K863 Pseudocyst of pancreas: Principal | ICD-10-CM

## 2021-11-29 DIAGNOSIS — L309 Dermatitis, unspecified: Principal | ICD-10-CM

## 2021-11-29 DIAGNOSIS — R21 Rash and other nonspecific skin eruption: Principal | ICD-10-CM

## 2021-11-29 MED ORDER — DUPILUMAB 300 MG/2 ML SUBCUTANEOUS PEN INJECTOR
SUBCUTANEOUS | 0 refills | 56 days
Start: 2021-11-29 — End: ?

## 2021-11-29 MED ORDER — DUPIXENT 300 MG/2 ML SUBCUTANEOUS PEN INJECTOR
SUBCUTANEOUS | 6 refills | 28 days
Start: 2021-11-29 — End: 2021-12-29

## 2021-11-29 MED ORDER — CREON 24,000-76,000-120,000 UNIT CAPSULE,DELAYED RELEASE
ORAL_CAPSULE | 5 refills | 0 days
Start: 2021-11-29 — End: ?

## 2021-11-29 MED ORDER — AMLODIPINE 5 MG TABLET
ORAL_TABLET | 1 refills | 0 days
Start: 2021-11-29 — End: ?

## 2021-11-30 DIAGNOSIS — R21 Rash and other nonspecific skin eruption: Principal | ICD-10-CM

## 2021-11-30 DIAGNOSIS — L309 Dermatitis, unspecified: Principal | ICD-10-CM

## 2021-11-30 MED ORDER — DUPIXENT 300 MG/2 ML SUBCUTANEOUS PEN INJECTOR
SUBCUTANEOUS | 6 refills | 28 days | Status: CP
Start: 2021-11-30 — End: 2021-12-30
  Filled 2021-12-04: qty 8, 28d supply, fill #0

## 2021-11-30 MED ORDER — DUPILUMAB 300 MG/2 ML SUBCUTANEOUS PEN INJECTOR
SUBCUTANEOUS | 0 refills | 56.00000 days
Start: 2021-11-30 — End: ?

## 2021-11-30 NOTE — Unmapped (Signed)
[facility] Refill Coordination Note    Specialty Medication(s) to be Shipped:   Inflammatory Disorders: Dupixent    Other medication(s) to be shipped: No additional medications requested for fill at this time     Timothy Tapia, DOB: Feb 21, 1966  Phone: [phone] (home) [phone] (work)      All above HIPAA information was verified with patient.     Was a Nurse, learning disability used for this call? No    Completed refill call assessment today to schedule patient's medication shipment from the [facility] [phone]).  All relevant notes have been reviewed.     Specialty medication(s) and dose(s) confirmed: Regimen is correct and unchanged.   Changes to medications: Nilan reports no changes at this time.  Changes to insurance: No  New side effects reported not previously addressed with a pharmacist or physician: None reported  Questions for the pharmacist: No    Confirmed patient received a Conservation officer, historic buildings and a Surveyor, mining with first shipment. The patient will receive a drug information handout for each medication shipped and additional FDA Medication Guides as required.       DISEASE/MEDICATION-SPECIFIC INFORMATION        For patients on injectable medications: Patient currently has 1 doses left.  Next injection is scheduled for 12/05/2021.    SPECIALTY MEDICATION ADHERENCE     Medication Adherence    Patient reported X missed doses in the last month: 0  Specialty Medication: Dupixent  Patient is on additional specialty medications: No  Any gaps in refill history greater than 2 weeks in the last 3 months: no  Demonstrates understanding of importance of adherence: yes  Informant: patient  Reliability of informant: reliable  Confirmed plan for next specialty medication refill: delivery by pharmacy  Refills needed for supportive medications: not needed              Were doses missed due to medication being on hold? No     Dupixent 300/2 mg/ml: 7 days of medicine on hand REFERRAL TO PHARMACIST     Referral to the pharmacist: Not needed      [facility]     Shipping address confirmed in Epic.     Delivery Scheduled: Yes, Expected medication delivery date: 12/04/2021.  However, Rx request for refills was sent to the provider as there are none remaining.     Medication will be delivered via Same Day Courier to the prescription address in Epic WAM.    Krissy Orebaugh D Roanne Haye   [facility] Specialty Technician

## 2021-12-01 NOTE — Unmapped (Signed)
duplicate

## 2021-12-03 MED ORDER — CREON 24,000-76,000-120,000 UNIT CAPSULE,DELAYED RELEASE
ORAL_CAPSULE | 5 refills | 0 days | Status: CP
Start: 2021-12-03 — End: ?

## 2021-12-03 MED ORDER — AMLODIPINE 5 MG TABLET
ORAL_TABLET | 1 refills | 0 days | Status: CP
Start: 2021-12-03 — End: ?

## 2021-12-03 NOTE — Unmapped (Signed)
Medication Requested: creon,amlodipine      Last Office Visit: 11/13/2021  Next Office Visit: 12/10/2021  Per Provider Note:   HTN: Controlled today. Will watch. On ACEi plus Ca++ bloocker. }On Creon   Standing order protocol requirements met?: Yes    Sent to: Pharmacy per protocol    Days Supply Given: 30 days  Number of Refills: 2

## 2021-12-04 NOTE — Unmapped (Unsigned)
Referral Services Note     Duration of Intervention: 25 minutes    TYPE OF CONTACT: Phone    ASSESSMENT: Pt stated that he needed vision resources. Pt stated that he needed eyewear.     INTERVENTION:  ***    PLAN:  ***

## 2021-12-04 NOTE — Unmapped (Signed)
RD called pt to follow-up on recent weight loss, nutritional intake. Pt reports he is still losing weight although has not weighed himself (last weight 146# clinical).     Pt reports he took otc medicine to treat worms from [facility]. Pt reports he believes he may have pinworms and treated himself.     Pt's weight has fluctuated and has trended down in the past month. Seems this can be attributed to:  -nausea secondary to dizziness  -irregular meal schedule due to work hours (pt reports not eating until after work hours/after 3pm)  -nutrition knowledge deficit  -increased energy needs due to COPD  -dysphagia  -GERD    RD will let provider know of pt using above medication.    Time of Intervention: 8 minutes    Unk Pinto, MS, RD, LDN

## 2021-12-10 ENCOUNTER — Ambulatory Visit
Admit: 2021-12-10 | Discharge: 2021-12-11 | Payer: MEDICARE | Attending: Infectious Disease | Primary: Infectious Disease

## 2021-12-10 DIAGNOSIS — E119 Type 2 diabetes mellitus without complications: Principal | ICD-10-CM

## 2021-12-10 LAB — URINALYSIS WITH MICROSCOPY
BILIRUBIN UA: NEGATIVE
BLOOD UA: NEGATIVE
GLUCOSE UA: NEGATIVE
KETONES UA: NEGATIVE
NITRITE UA: NEGATIVE
PH UA: 5.5 (ref 5.0–9.0)
PROTEIN UA: NEGATIVE
RBC UA: <1 /HPF (ref <3)
SPECIFIC GRAVITY UA: 1.02 (ref 1.005–1.030)
SQUAMOUS EPITHELIAL: 2 /HPF (ref 0–5)
UROBILINOGEN UA: 0.2
WBC UA: 20 /HPF — ABNORMAL HIGH (ref <2)

## 2021-12-10 LAB — ALBUMIN / CREATININE URINE RATIO
ALBUMIN QUANT URINE: <0.3 mg/dL
CREATININE, URINE: 99.3 mg/dL

## 2021-12-10 MED ORDER — METFORMIN 500 MG TABLET
ORAL_TABLET | Freq: Every day | ORAL | 3 refills | 90 days | Status: CP
Start: 2021-12-10 — End: 2022-12-10
  Filled 2021-12-10: qty 90, 90d supply, fill #0

## 2021-12-10 MED ORDER — MECLIZINE 25 MG TABLET
ORAL_TABLET | Freq: Three times a day (TID) | ORAL | 3 refills | 10 days | Status: CP | PRN
Start: 2021-12-10 — End: 2022-03-10
  Filled 2021-12-10: qty 30, 10d supply, fill #0

## 2021-12-10 NOTE — Unmapped (Signed)
Great seeing you in clinic Timothy Tapia    Continue taking your medications as prescribed    START taking metformin 500mg  once day

## 2021-12-10 NOTE — Unmapped (Addendum)
PCP:  Lynnea Ferrier, MD    12/10/2021    This is a RETURN visit for this 56 y.o. male with the following diagnoses:    Patient Active Problem List    Diagnosis Date Noted    Chronic bilateral low back pain 11/09/2019    COPD (chronic obstructive pulmonary disease) (CMS-HCC) 05/27/2017    Hypokalemia 05/27/2017    GERD (gastroesophageal reflux disease) 05/27/2017    Pancreatic pseudocyst 07/06/2015    GI bleeding 07/06/2015    ETOH abuse 07/06/2015    DVT (deep venous thrombosis), IMV thrombus 10/07/13 provoked per hematology (34mo therapy) 10/25/2013    Colitis 10/12/2013    Pancreatitis 02/15/2013    Tobacco use disorder 02/15/2013    HTN (hypertension) 02/15/2013    Barrett esophagus 02/15/2013    Asthma 02/15/2013    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 Reports eating a lot    Lightheadedness  Improved on Meclizine, provided refills today    Diabetes  A1c 7.0, endorses polyuria previously did not tolerate metformin due to Gi side effects, was on metformin 500 mg BID. Suspect this is likely due to his TIVICAY which can increase blood levels of metformin. Patient amenable to restarting metformin at 500 mg daily.  - urinalysis  - microalbumin/creatinine ratio  -START metformin 500 mg daily    GERD  Continue protonix 20 mg daily    Chronic pancreatitis  Reports adherence to his Creon, continue    Mental Health  Stable and good    Sexual Health  No sexual activity since last visit  Safe sex with partner  Risky behavior. Will screen for GC/chlamydia, check RPR given risk behaviors    Health Maintenance   Immunizations:  Cancer Screening:   Lipids:  Other:    Will follow up X months.       Chief Complaint: Routine HIV follow-up    HPI:  Mr. Timothy Tapia returns for a routine appointment.     Since last visit has been well. No major complaints.     HIV-wise: Stable CD4 and HIV RNA. Reports good adherence to ART.      ROS:   No fever chills, sweats, nausea, vomiting, diarrhea, rash, headache, joint pain.    All other systems are negative.    ALLERGIES:  Sulfa (sulfonamide antibiotics) and Sulfur    MEDICATIONS:  Current Outpatient Medications   Medication Sig Dispense Refill    amLODIPine (NORVASC) 5 MG tablet TAKE ONE TABLET BY MOUTH DAILY 30 tablet 1    betamethasone, augmented, (DIPROLENE) 0.05 % ointment Apply twice daily to active areas until smooth. 150 g 5    cetirizine (ZYRTEC) 10 MG tablet TAKE ONE TABLET BY MOUTH EVERY DAY 30 tablet 2    clotrimazole (MYCELEX) 10 mg troche Take 1 tablet (10 mg total) by mouth five (5) times a day. 70 Troche 0    CREON 24,000-76,000 -120,000 unit CpDR delayed release capsule TAKE 1 CAPSULE BY MOUTH THREE TIMES DAILY WITH A MEAL 90 capsule 5    dupilumab (DUPIXENT PEN) 300 mg/2 mL PnIj Inject the contents of 1 pen (300 mg total) under the skin once a week. 8  mL 6    empty container Misc Use as directed to dispose of Dupixent pens. 1 each 2    enalapril (VASOTEC) 20 MG tablet TAKE ONE TABLET BY MOUTH EVERY DAY 90 tablet 2    gabapentin (NEURONTIN) 300 MG capsule Take 3 capsules (900 mg total) by mouth two (2) times a day. 270 capsule 6    halobetasol (ULTRAVATE) 0.05 % ointment APPLY TO AFFECTED AREA TWICE A DAY AS NEEDED AVOID FACE AND FOLDS 30 g 1    hydrOXYzine (ATARAX) 25 MG tablet Take 1 tablet (25 mg total) by mouth at bedtime. Prn pruritus 30 tablet 0    ivermectin (STROMECTOL) 3 mg Tab Take 5 tablets by mouth once today and then take another 5 tablets by mouth 1 week later 10 tablet 0    Magic Mouthwash (nystatin/diphenhydramine/mylanta) Oral Mixture Take 10 mL by mouth every six (6) hours as needed. Swish 30 seconds, gargle, spit as needed. 240 mL 0    methylPREDNISolone (MEDROL DOSEPACK) 4 mg tablet follow package directions 1 each 0    mirtazapine (REMERON) 30 MG tablet TAKE ONE TABLET BY MOUTH EVERY DAY 30 tablet 6    pantoprazole (PROTONIX) 20 MG tablet Take 1 tablet (20 mg total) by mouth daily. 90 tablet 3    permethrin (ELIMITE) 5 % cream Apply to entire body for 8 to 14 hours then rinse off, then repeat 1 week later 60 g 0    PREZCOBIX 800-150 mg-mg tablet TAKE ONE TABLET BY MOUTH DAILY 30 tablet 6    TIVICAY 50 mg TABLET TAKE ONE TABLET BY MOUTH DAILY 30 tablet 6    triamcinolone (KENALOG) 0.1 % ointment Apply to itchy spots 2 times daily. For elbows at night time, apply medicine and then cover with saran wrap. 454 g 0    VENTOLIN HFA 90 mcg/actuation inhaler INHALE 2 PUFFS UP TO FOUR TIMES DAILY AS NEEDED 18 g 11     Current Facility-Administered Medications   Medication Dose Route Frequency Provider Last Rate Last Admin    triamcinolone acetonide (KENALOG-40) injection 40 mg  40 mg Intra-articular Once Evaristo Bury, PA           PHYSICAL EXAM:  Vitals:    12/10/21 1506   BP: 118/77   Pulse: 79   Temp: 36.8 ??C (98.2 ??F)     CONSTITUTIONAL: Alert,well appearing, no distress    DATA:    Results for orders placed or performed in visit on 11/13/21   Hemoglobin A1c   Result Value Ref Range    Hemoglobin A1C 7.0 (H) 4.8 - 5.6 %    Estimated Average Glucose 154 mg/dL   HIV RNA, Quantitative, PCR   Result Value Ref Range    HIV RNA Quant Result Detected (A) Not Detected    HIV RNA <20 (H) <0 copies/mL    HIV RNA Log10     TSH   Result Value Ref Range    TSH 0.949 0.550 - 4.780 uIU/mL     *Note: Due to a large number of results and/or encounters for the requested time period, some results have not been displayed. A complete set of results can be found in Results Review.        Creatinine   Date Value Ref Range Status   11/08/2021 0.76 0.60 - 1.10 mg/dL Final   16/04/9603 5.40 0.60 - 1.10 mg/dL Final   98/05/9146 8.29 0.60 - 1.10 mg/dL Final   56/21/3086 5.78 0.60 - 1.10 mg/dL  Final   05/13/2019 0.84 0.76 - 1.27 mg/dL Final   16/04/9603 5.40 0.70 - 1.30 mg/dL Final   98/05/9146 8.29 0.70 - 1.30 mg/dL Final   56/21/3086 5.78 0.70 - 1.30 mg/dL Final       Triglycerides   Date Value Ref Range Status   06/18/2021 163 (H) 0 - 150 mg/dL Final   46/96/2952 841 1 - 149 mg/dL Final     HDL   Date Value Ref Range Status   06/18/2021 42 40 - 60 mg/dL Final   32/44/0102 79 (H) 40 - 59 mg/dL Final     LDL Calculated   Date Value Ref Range Status   06/18/2021 115 (H) 40 - 99 mg/dL Final     Comment:     NHLBI Recommended Ranges, LDL Cholesterol, for Adults (20+yrs) (ATPIII), mg/dL  Optimal              <725  Near Optimal        100-129  Borderline High     130-159  High                160-189  Very High            >=190  NHLBI Recommended Ranges, LDL Cholesterol, for Children (2-19 yrs), mg/dL  Desirable            <366  Borderline High     110-129  High                 >=130       LDL Cholesterol, Calculated   Date Value Ref Range Status   02/15/2013 100 mg/dL Final     Comment:     :  ADULTS (20 years or older)  Optimal         <100  Near Optimal    100-129  Borderline High 130-159  High            160-189  Very High       >=190  CHILDREN (2-19 years)  Desirable       <110  Borderline High 110-129  High            >/= 130       IMAGING STUDIES:  None

## 2021-12-11 ENCOUNTER — Institutional Professional Consult (permissible substitution): Admit: 2021-12-11 | Discharge: 2021-12-12 | Payer: MEDICARE | Attending: Registered" | Primary: Registered"

## 2021-12-11 DIAGNOSIS — Z713 Dietary counseling and surveillance: Principal | ICD-10-CM

## 2021-12-11 NOTE — Unmapped (Signed)
[facility]  Adult Medical Nutrition Therapy - Nutrition Follow-Up Visit  Date: 12/11/2021    Patient Name:             Timothy Tapia    Date of Birth / Age / Birth Gender:            August 08, 1965 / 56 y.o. / Male    Referring MD or Clinic:             Self, Referred      Interpreter:            No      Reason for Referral:   General, healthful diet order yes: weight management, weight reduction and diabetes      Nutritional Concerns/Comments:  55 y.o. male seeking nutritional counseling for weight management, weight reduction and diabetes.    Pt reports to follow-up nutrition visit with BMI of 25.27 kg/m2 (overweight) with a history of COPD, GERD, ETOH abuse, DVT, HTN, barrett esophagus seeking nutritional counseling to mitigate elevated glucose levels.      Pt returns to visit today reporting that he is back to gaining weight.   It seems that most recent weight loss was multifactorial:  -disordered eating pattern  -increase in physical activity  -nutrition-knowledge deficit  -previous vertigo/dizziness episodes    At today's visit, RD re-educated pt on the following:  -consistent meal schedule  -my plate guidelines  -nutrition supplementation replacement for lunch meal  -reduction of processed carbohydrates/added sugars  -lean protein sources     DYSGLYCEMIA: Elevated at 7.0%. Started metformin today (12/11/2021) 500mg  daily- no side-effects.RD encouraged stable meal schedule, reduction of processed carbohydrate intake and increasing protein/fiber intake.    Component Ref Range & Units 11/13/21 1622 08/13/21 1002 12/11/20 1109 06/12/20 1129     Hemoglobin A1C 4.8 - 5.6 % 7.0 High  6.5 High  5.9 High  6.5 High     Estimated Average Glucose mg/dL 161 096 045 409       DIET: For breakfast, pt will have oatmeal. For dinner, pt had chicken, mashed potatoes and salad. Beverages include water.    PHYSICAL ACTIVITY: Pt walks to and from work daily (30 minutes).    FOOD INSECURITY: Pt screens negative. Pt receives SNAP benefits ($200). Pt receives food from [employer].    Anthropometric Data:   Height:   5'4   Weight:   147 lbs   BMI:   25.27 kg/m2                Weight History:               Wt Readings from Last 6 Encounters:   12/10/21 66.8 kg (147 lb 3.2 oz)   11/13/21 66.4 kg (146 lb 6.4 oz)   11/08/21 72.6 kg (160 lb)   09/14/21 70.8 kg (156 lb)   09/13/21 71.7 kg (158 lb 1.6 oz)   09/10/21 69.8 kg (153 lb 12.8 oz)       Usual body weight: 160 lbs  Ideal Body Weight:  130 lbs  Goal weight: 133 lbs      Physical Findings Data:               BP Today: n/a (telephone visit)                 BP History:                 BP Readings from Last  6 Encounters:   12/10/21 118/77   11/13/21 123/74   11/08/21 138/80   09/14/21 138/78   08/29/21 120/80   08/13/21 122/73       Biochemical Data:              A1C:    HGB A1C, POC   Date Value Ref Range Status   03/31/2020 8.7 (H) <7.0 % Final     Comment:     A1c Glycemic Goal: <7.0%     **Goals should be individualized; more or less stringent A1c glycemic goals may be appropriate for individual patients.      (Adopted from: 2020 ADA Standards of Medical Care In Diabetes)  Point of Care A1c testing is not FDA-approved for the diagnosis of Diabetes.     Hemoglobin A1C   Date Value Ref Range Status   11/13/2021 7.0 (H) 4.8 - 5.6 % Final                 Estimated Blood Glucose:   Lab Results   Component Value Date    Estimated Average Glucose 154 11/13/2021    EST AVERAGE GLUCOSE, POC 203 03/31/2020                Total Cholesterol:   Cholesterol   Date Value Ref Range Status   06/18/2021 190 <=200 mg/dL Final     Cholesterol, Total   Date Value Ref Range Status   02/15/2013 208 (H) 100 - 199 mg/dL Final                Trig:   Triglycerides   Date Value Ref Range Status   06/18/2021 163 (H) 0 - 150 mg/dL Final   16/04/9603 540 1 - 149 mg/dL Final                 HDL:   HDL   Date Value Ref Range Status   06/18/2021 42 40 - 60 mg/dL Final   98/05/9146 79 (H) 40 - 59 mg/dL Final                  LDL:   LDL Calculated   Date Value Ref Range Status   06/18/2021 115 (H) 40 - 99 mg/dL Final     Comment:     NHLBI Recommended Ranges, LDL Cholesterol, for Adults (20+yrs) (ATPIII), mg/dL  Optimal              <829  Near Optimal        100-129  Borderline High     130-159  High                160-189  Very High            >=190  NHLBI Recommended Ranges, LDL Cholesterol, for Children (2-19 yrs), mg/dL  Desirable            <562  Borderline High     110-129  High                 >=130       LDL Cholesterol, Calculated   Date Value Ref Range Status   02/15/2013 100 mg/dL Final     Comment:     :  ADULTS (20 years or older)  Optimal         <100  Near Optimal    100-129  Borderline High 130-159  High            160-189  Very High       >=190  CHILDREN (2-19 years)  Desirable       <110  Borderline High 110-129  High            >/= 130       Current Medications, Herbs, Supplements:    Current Outpatient Medications:   ???  amLODIPine (NORVASC) 5 MG tablet, TAKE ONE TABLET BY MOUTH DAILY, Disp: 30 tablet, Rfl: 1  ???  betamethasone, augmented, (DIPROLENE) 0.05 % ointment, Apply twice daily to active areas until smooth., Disp: 150 g, Rfl: 5  ???  cetirizine (ZYRTEC) 10 MG tablet, TAKE ONE TABLET BY MOUTH EVERY DAY, Disp: 30 tablet, Rfl: 2  ???  clotrimazole (MYCELEX) 10 mg troche, Take 1 tablet (10 mg total) by mouth five (5) times a day., Disp: 70 Troche, Rfl: 0  ???  CREON 24,000-76,000 -120,000 unit CpDR delayed release capsule, TAKE 1 CAPSULE BY MOUTH THREE TIMES DAILY WITH A MEAL, Disp: 90 capsule, Rfl: 5  ???  dupilumab (DUPIXENT PEN) 300 mg/2 mL PnIj, Inject the contents of 1 pen (300 mg total) under the skin once a week., Disp: 8 mL, Rfl: 6  ???  empty container Misc, Use as directed to dispose of Dupixent pens., Disp: 1 each, Rfl: 2  ???  enalapril (VASOTEC) 20 MG tablet, TAKE ONE TABLET BY MOUTH EVERY DAY, Disp: 90 tablet, Rfl: 2  ???  gabapentin (NEURONTIN) 300 MG capsule, Take 3 capsules (900 mg total) by mouth two (2) times a day., Disp: 270 capsule, Rfl: 6  ???  halobetasol (ULTRAVATE) 0.05 % ointment, APPLY TO AFFECTED AREA TWICE A DAY AS NEEDED AVOID FACE AND FOLDS, Disp: 30 g, Rfl: 1  ???  hydrOXYzine (ATARAX) 25 MG tablet, Take 1 tablet (25 mg total) by mouth at bedtime. Prn pruritus, Disp: 30 tablet, Rfl: 0  ???  Magic Mouthwash (nystatin/diphenhydramine/mylanta) Oral Mixture, Take 10 mL by mouth every six (6) hours as needed. Swish 30 seconds, gargle, spit as needed., Disp: 240 mL, Rfl: 0  ???  meclizine (ANTIVERT) 25 mg tablet, Take 1 tablet (25 mg total) by mouth Three (3) times a day as needed., Disp: 30 tablet, Rfl: 3  ???  metFORMIN (GLUCOPHAGE) 500 MG tablet, Take 1 tablet (500 mg total) by mouth in the morning., Disp: 90 tablet, Rfl: 3  ???  methylPREDNISolone (MEDROL DOSEPACK) 4 mg tablet, follow package directions, Disp: 1 each, Rfl: 0  ???  mirtazapine (REMERON) 30 MG tablet, TAKE ONE TABLET BY MOUTH EVERY DAY, Disp: 30 tablet, Rfl: 6  ???  pantoprazole (PROTONIX) 20 MG tablet, Take 1 tablet (20 mg total) by mouth daily., Disp: 90 tablet, Rfl: 3  ???  permethrin (ELIMITE) 5 % cream, Apply to entire body for 8 to 14 hours then rinse off, then repeat 1 week later, Disp: 60 g, Rfl: 0  ???  PREZCOBIX 800-150 mg-mg tablet, TAKE ONE TABLET BY MOUTH DAILY, Disp: 30 tablet, Rfl: 6  ???  TIVICAY 50 mg TABLET, TAKE ONE TABLET BY MOUTH DAILY, Disp: 30 tablet, Rfl: 6  ???  triamcinolone (KENALOG) 0.1 % ointment, Apply to itchy spots 2 times daily. For elbows at night time, apply medicine and then cover with saran wrap., Disp: 454 g, Rfl: 0  ???  VENTOLIN HFA 90 mcg/actuation inhaler, INHALE 2 PUFFS UP TO FOUR TIMES DAILY AS NEEDED, Disp: 18 g, Rfl: 11    Current Facility-Administered Medications:   ???  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 For each statement, please tell me whether the statement was often true, sometimes true, or never true for your household in the last month.     1. ???We worried whether our food would run out before we got money to buy more.???   Never True     2. ???The food that we bought just didn't last, and we didn't have money to get more.  Never True       Usual Daily Food Choices:      24-Hour Recall/Usual Intake:  Time Intake   Breakfast oatmeal   Snack (AM)    Lunch Muscle milk   Snack (PM)    Dinner Chicken, mashed potatoes, salad   Snack (HS)      Food and Nutrient Intake:  Snacks:  Chips, cookies, chips  Beverages:  Tea, sparking water  Dining Out:  n/a  Cooking Methods: bbq, grilling, baking  Usual Food Choices: fruit, cookies, chips, ice cream   Meal Schedule:  irregular    Behavioral Factors:  Overeating: Denied issues with overeating.   Emotional Eating: No issues noted.   Grazing: Denied issues.   Fast Eating: Denied issues.   Nighttime Eating: Nighttime overeating.    Factors Affecting Food Intake:  Knowledge and Beliefs: He presents with food and nutrition-related knowledge deficit. He has not previously met with dietitian or participated in nutrition program.   Stress/Anxiety: Did not report issues with stress/anxiety.   Sleep Patterns: Did not report issues with sleeping habits and patterns.  Food Safety and Access: No issues noted.  Other: n/a        Food Intolerances/Dietary Restrictions:  No known food allergies or food intolerances.     Other GI Issues:  Heartburn    Hunger and Satiety:  Denied issues. Allergies:  is allergic to sulfa (sulfonamide antibiotics) and sulfur.      Usual Daily Physical Activity Factors: >/=1.0 to <1.4 (Sedentary)  Method for estimating physical activity factor: 2015-2020 DRI Guidelines    Estimated Adherence:  Client self-reported adherence score 8  Method for estimating adherence: Adult Meducation Readiness Ruler      NUTRITION DIAGNOSIS:   Inadequate  energy intake   Inadequate  intake of grains  Inadequate  intake of fruits  Inadequate  intake of vegetables  Inadequate  intake of milk/milk products  Inadequate  intake of meat, poultry, fish, eggs, beans, nut products    Method for estimating nutritional adequacy: 2015-2020 DRI Guidelines     Clinical Diagnosis:   Excessive Carbohydrate Intake as related to nutrition-knowledge deficit, cravings for sugary foods as evidenced by most recent hbA1c of 7.0% and self-reported high intake of processed carbohydrates.            NUTRITION INTERVENTIONS/PLAN OF CARE    Nutrition Counseling  Nutrition Education     Behavior therapy strategies of self-monitoring food intake, physical activity and goal setting.    Coordination of care via Mail and Medical Record    Client agreed to follow interventions:  Yes       Nutrition-Related Medication Management: Nutrition-related complimentary/alternative medicine      Nutrition Goals:  1. Add/include protein source at meal times--> chicken, fish, [place], beans, eggs, cottage cheese, nuts and nut butters  2. Moving calories to AM/lunch to reduce cravings at night  -start to eat a balanced breakast and high-protein lunch  3. Balanced meals/the plate method:  1/2 plate vegetables  1/4 plate lean protein  1/4 plate whole-grain carbohydraate  4. Healthier snack ideas (protein/fiber-rich)  -protein bars (Pure Protein OR [facility] Protein)- no more than 5g added sugar with at least 10g protein  -mixed nuts/peanuts  -various fruits with peanut butter  -Muscle Milk  -Premier Protein Shake  5. Incorporate more whole-grains into daily intake  -whole-wheat bread  -brown rice  -whole-grain cereals  6. Use the plate method:  -1/2 plate non-starchy vegetables  -1/4 plate lean proteins  -1/4 plate whole-grain carbohydrates  7. Consistent meal schedule-try to avoid skipping meals/eating every 3/4 hours.      Patient Education:  Individualized nutrition counseling and education provided on:   Macronutrient Pairing  The Plate Method  Protein Sources  Meal Schedule    Materials Provided:  Patient instructions: nutrition-related recommendations   Handouts supporting dietary recommendations:     Additional Materials:   Written nutrition tips , Online resources         NUTRITION MONITORING AND EVALUATION:  Follow-up appointment: telephone/video in 4 weeks     Goals for next appointment:  1. Add/include protein source at meal times--> chicken, fish, [place], beans, eggs, cottage cheese, nuts and nut butters  2. Moving calories to AM/lunch to reduce cravings at night  -start to eat a balanced breakast and high-protein lunch  3. Balanced meals/the plate method:  1/2 plate vegetables  1/4 plate lean protein  1/4 plate whole-grain carbohydraate  4. Healthier snack ideas (protein/fiber-rich)  -protein bars (Pure Protein OR [facility] Protein)- no more than 5g added sugar with at least 10g protein  -mixed nuts/peanuts  -various fruits with peanut butter  -Muscle Milk  -Premier Protein Shake  5. Incorporate more whole-grains into daily intake  -whole-wheat bread  -brown rice  -whole-grain cereals  6. Use the plate method:  -1/2 plate non-starchy vegetables  -1/4 plate lean proteins  -1/4 plate whole-grain carbohydrates  7. Consistent meal schedule-try to avoid skipping meals/eating every 3/4 hours.      Number of RD Visits: 5 of 14 visits    Length of visit was: 16 minutes      **Pt has Medicare as insurance. It does not meet criteria for billing services for this nutritional service. Therefore it will be covered under the [facility]**          The patient reports they are currently: at home. I spent 16 minutes on the phone with the patient on the date of service. I spent an additional 4 minutes on pre- and post-visit activities on the date of service.     The patient was physically located in [place] or a state in which I am permitted to provide care. The patient and/or parent/guardian understood that s/he may incur co-pays and cost sharing, and agreed to the telemedicine visit. The visit was reasonable and appropriate under the  circumstances given the patient's presentation at the time.    The patient and/or parent/guardian has been advised of the potential risks and limitations of this mode of treatment (including, but not limited to, the absence of in-person examination) and has agreed to be treated using telemedicine. The patient's/patient's family's questions regarding telemedicine have been answered.     If the visit was completed in an ambulatory setting, the patient and/or parent/guardian has also been advised to contact their provider???s office for worsening conditions, and seek emergency medical treatment and/or call 911 if the patient deems either necessary.      Signed:  Unk Pinto  12/11/2021 3:02 PM

## 2021-12-11 NOTE — Unmapped (Addendum)
Returned [employer]. He was curious about the glucose level in his urine sample he gave yesterday.   Glucose - negative. Stated he did not tolerate Metformin in the past. Has started taking it again today. So far he feels good. Stated understanding to call the clinic if needed.     ----- Message from Franki Cabot sent at 12/11/2021 11:23 AM EDT -----  Incoming Call  Caller: [name]  Best callback number: [phone]  Reason for call: Please call patient back about his lab results

## 2021-12-11 NOTE — Unmapped (Signed)
Nutrition Goals:  1. Add/include protein source at meal times--> chicken, fish, [place], beans, eggs, cottage cheese, nuts and nut butters  2. Moving calories to AM/lunch to reduce cravings at night  -start to eat a balanced breakast and high-protein lunch  3. Balanced meals/the plate method:  1/2 plate vegetables  1/4 plate lean protein  1/4 plate whole-grain carbohydraate  4. Healthier snack ideas (protein/fiber-rich)  -protein bars (Pure Protein OR [facility] Protein)- no more than 5g added sugar with at least 10g protein  -mixed nuts/peanuts  -various fruits with peanut butter  -Muscle Milk  -Premier Protein Shake  5. Incorporate more whole-grains into daily intake  -whole-wheat bread  -brown rice  -whole-grain cereals  6. Use the plate method:  -1/2 plate non-starchy vegetables  -1/4 plate lean proteins  -1/4 plate whole-grain carbohydrates  7. Consistent meal schedule-try to avoid skipping meals/eating every 3/4 hours.

## 2021-12-13 NOTE — Unmapped (Signed)
Pt called RD to talk about getting diabetic needles to check blood sugar since he is on metformin now. RD stated she cannot order this nor does she see this order in place. RD stated that she would let his PCP now of this request.     RD also discussed ways to mitigate glucose levels such as:  -consistent eating pattern  -using the plate method  -food pairing  -physical activity  -reduction of added sugars/processed carbohydrates    Time of Intervention: 8 minutes    Unk Pinto, MS, RD, LDN

## 2021-12-20 NOTE — Unmapped (Signed)
[facility] Refill Coordination Note    Specialty Medication(s) to be Shipped:   Inflammatory Disorders: Dupixent 300mg /41ml Inj Pen   Other medication(s) to be shipped: No additional medications requested for fill at this time     Timothy Tapia, DOB: 17-Sep-1965  Phone: [phone] (home) [phone] (work)    All above HIPAA information was verified with patient.     Was a Nurse, learning disability used for this call? No    Completed refill call assessment today to schedule patient's medication shipment from the [facility] [phone]).  All relevant notes have been reviewed.     Specialty medication(s) and dose(s) confirmed: Regimen is correct and unchanged.   Changes to medications: Timothy Tapia reports no changes at this time.  Changes to insurance: No  New side effects reported not previously addressed with a pharmacist or physician: None reported  Questions for the pharmacist: No    Confirmed patient received a Conservation officer, historic buildings and a Surveyor, mining with first shipment. The patient will receive a drug information handout for each medication shipped and additional FDA Medication Guides as required.       DISEASE/MEDICATION-SPECIFIC INFORMATION        For patients on injectable medications: Patient currently has 1 doses left.  Next injection is scheduled for 12/27/2021.    SPECIALTY MEDICATION ADHERENCE     Medication Adherence    Patient reported X missed doses in the last month: 0  Specialty Medication: Dupixent 300mg /88ml Inj Pen  Patient is on additional specialty medications: No  Patient is on more than two specialty medications: No  Informant: patient  Reliability of informant: reliable  Reasons for non-adherence: no problems identified        Were doses missed due to medication being on hold? No    Dupixent 300mg /82ml Inj Pen: 7 days of medicine on hand     REFERRAL TO PHARMACIST     Referral to the pharmacist: Not needed    [facility]     Shipping address confirmed in Epic. Delivery Scheduled: Yes, Expected medication delivery date: 12/27/2021.     Medication will be delivered via Same Day Courier to the prescription address in Epic WAM.    Timothy Tapia P Allena Katz   [facility] Specialty Technician

## 2021-12-24 ENCOUNTER — Ambulatory Visit: Admit: 2021-12-24 | Discharge: 2021-12-25 | Payer: MEDICARE

## 2021-12-24 DIAGNOSIS — H0016 Chalazion left eye, unspecified eyelid: Principal | ICD-10-CM

## 2021-12-24 DIAGNOSIS — H0013 Chalazion right eye, unspecified eyelid: Principal | ICD-10-CM

## 2021-12-24 NOTE — Unmapped (Signed)
Referral Services Note     Duration of Intervention: 20 minutes    TYPE OF CONTACT: Phone and Epic Chat with Provider    ASSESSMENT: Sw received a message from the admin team stating the pt would like to speak with social work      INTERVENTION: Sw called the pt back to discuss concerns. Pt informed sw that his Medicaid was denied. Pt states during the re-certification process he was questioned about vehicles in his name. SW inquired if the pt had called the number on his notification letter. The pt states he has yet to call. Sw encouraged the pt to call Medicaid for further assistance. Sw explained if he had a relative with a vehicle and he was on the title it would also show up in their system.      Pt also informed sw that he has an eye stye. The pt states it has been present for two weeks and has gotten bigger. The pt stated that he wanted to pop the stye, but he was afraid it would become infected. The pt currently denies any pain in the area; however, he states it itches a lot. Pt has requested to be seen by a medical provider.    PLAN:  Sw contacted the admin team to inquire about upcoming opening for medical care.    UPDATE: Pt was scheduled for 3:30 pm with same-day clinic.    Sofie Rower, LCSW-A  [facility] Social Work

## 2021-12-24 NOTE — Unmapped (Signed)
INTERNAL MEDICINE ADVANCED SAME DAY CLINIC NOTE     12/24/2021    PCP: Lynnea Ferrier, MD     Assessment and Plan    Zyden was seen today for stye.    Diagnoses and all orders for this visit:    Chalazion of both eyes: Given lack of tenderness, erythema and yellow/white coloration, large R lower eyelid mass and small L lower eyelid mass is most concerning for chalazion.    - advised patient to use warm compresses to both eyelids for 5-10 minutes four times daily   - advised them that if there is not reduction of chalazion in 2 weeks to call his doctor for ophthalmology referral     Follow up as scheduled or sooner as needed.    Patient was seen and discussed with Dr. Phineas Real who is in agreement with the assessment and plan as outlined above.     Subjective    Problem List:  Patient Active Problem List   Diagnosis    Chorioretinal scar    Human immunodeficiency virus (HIV) disease (CMS-HCC)    Pancreatitis    Tobacco use disorder    HTN (hypertension)    Barrett esophagus    Asthma    Colitis    DVT (deep venous thrombosis), IMV thrombus 10/07/13 provoked per hematology (56mo therapy)    Pancreatic pseudocyst    GI bleeding    ETOH abuse    COPD (chronic obstructive pulmonary disease) (CMS-HCC)    Hypokalemia    GERD (gastroesophageal reflux disease)    Chronic bilateral low back pain       HPI  Tabb Croghan is a 56 y.o. year old male with the above problem list who presents to the same day clinic for   Chief Complaint   Patient presents with    Stye     Presenting with 2 weeks of itchy, growing R lower eyelid mass and similar but much smaller L lower eyelid mass. Denies pain, erythema, discharge, vision changes, fevers, chills. Has not tried anything to treat the masses.     Meds and allergies were reviewed in Epic    ROS: 10 point ROS was performed and is otherwise negative other than mentioned in the HPI    Objective  PE:  Vitals:    12/24/21 1503   BP: 131/77   Pulse: 87   Resp: 16   Temp: 36.9 ??C (98.5 ??F) SpO2: 97%     General: well-appearing in NAD  Eyes: EOMI, sclera clear, PERRL, large, R lower eyelid mass with white/yellow center without tenderness or warmth, much smaller L lower eyelid mass with same characteristics  ENT: OP clear w/o erythema or exudate  CV: regular, no murmurs  Resp: CTAB, no wheezes or crackles, normal WOB  GI: soft, NTND, NABS  MSK: full ROM, no deformity noted  Skin: clean and dry, no rashes or lesions noted  Ext: no cyanosis/clubbing/edema  Neuro: alert, follows commands. CN II-XII grossly intact    Procedure: None  See procedure note from this encounter    _______________________________________________________________________________________    Same Day Metric Tracker:    Same Day Metric Tracker:  Referral source for today's visit:  General Internal Medicine    Referral to other outpatient urgent services? N/A    Did today's visit result in referral to ED or direct admission? No

## 2021-12-24 NOTE — Unmapped (Signed)
I saw and evaluated the patient, participating in the key portions of the service.  I reviewed the resident’s note.  I agree with the resident’s findings and plan. Consetta Cosner R Cathren Sween, MD

## 2021-12-24 NOTE — Unmapped (Addendum)
Thank you for coming to clinic today. As we discussed, please use warm compresses for 5-10 minutes four times a day. If your symptoms / chalazion does not improve in 2 weeks, call your doctor to schedule a visit with an ophthalmologist.

## 2021-12-27 MED FILL — DUPIXENT 300 MG/2 ML SUBCUTANEOUS PEN INJECTOR: SUBCUTANEOUS | 28 days supply | Qty: 8 | Fill #1

## 2022-01-07 ENCOUNTER — Ambulatory Visit: Admit: 2022-01-07 | Discharge: 2022-01-08 | Payer: MEDICARE

## 2022-01-07 DIAGNOSIS — R051 Acute cough: Principal | ICD-10-CM

## 2022-01-07 DIAGNOSIS — R6889 Other general symptoms and signs: Principal | ICD-10-CM

## 2022-01-07 MED ORDER — FLUTICASONE PROPIONATE 50 MCG/ACTUATION NASAL SPRAY,SUSPENSION
Freq: Every day | NASAL | 0 refills | 120 days | Status: CP
Start: 2022-01-07 — End: 2023-01-07
  Filled 2022-01-07: qty 16, 60d supply, fill #0

## 2022-01-07 NOTE — Unmapped (Addendum)
-----   Message from Franki Cabot sent at 01/07/2022  9:34 AM EDT -----  Incoming Call  Caller: [name]  Best callback number: [phone]  Reason for call: Patient wants a call back. He said he talked to Jonah last week and needs a call back                                            RN Follow Up     Pt. Called with symptoms of cough and congestion x 3-4 days. Pt. States,  every time I take Metformin I catch a cold. Pt. States that he stopped his metformin since being sick.  Pt. Was scheduled an appt with the same day clinic a 4 pm today for cold symptoms. Message about metformin was sent via epic chat to provider.

## 2022-01-07 NOTE — Unmapped (Signed)
It was a pleasure meeting you today we will do the following  1) Order a chest x-ray  2) prescribe flonase which use can use daily  3) Give a sputum sample

## 2022-01-07 NOTE — Unmapped (Signed)
INTERNAL MEDICINE ADVANCED SAME DAY CLINIC NOTE     01/07/2022    PCP: Lynnea Ferrier, MD     Assessment and Plan    56 M w/ PMHx of HIV (VL < 20 in May 2023), DM2, HTN, chronic pancreatits, COPD, GERD who p/w 4 days of nasal congestion, productive cough and HA.     1. C/f Viral URI - Productive Cough  3-4-day history of sinus congestion, productive cough, headache.  No fevers, chills, rigors at home, afebrile in clinic.  Produce sputum is yellow in color.  No other associated symptoms noted.  Does not have any sick contacts.  On exam Timothy Tapia is well-appearing, and is clear to auscultation bilaterally.  Overall I favor that he has a nonspecific viral upper respiratory infection.  Given the productive cough I will obtain chest imaging to rule out pneumonia.  Otherwise will provide supportive care with Flonase and Zyrtec.  - RAPID INFLUENZA/RSV/COVID PCR  - fluticasone propionate (FLONASE) 50 mcg/actuation nasal spray; Use 1 spray into each nostril daily.  Dispense: 16 g; Refill: 0  - XR Chest 2 views; Future  - Lower Respiratory Culture    Follow up as scheduled or sooner as needed.    Patient was seen and discussed with Dr. Lennie Muckle who is in agreement with the assessment and plan as outlined above.       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 year old male with the above problem list who presents to the same day clinic for   Chief Complaint   Patient presents with    Cough Congestion    Headache     X 3 days        Symptoms started 3-4 days ago. Productive cough, sinus congestion, headache. The phlegm he produces is yellow. Does not note fevers, chills, sinus pain, sore throat, SOB, CP, N/V, diarrhea. No sick contacts.     Has tried APAP, mucinex, with some relief.     Says this has happened to him in the past, thinks its related to metformin, started Metformin a little over a week ago. Notes last time he started metformin he developed PNA.     Meds and allergies were reviewed in Epic    ROS: 10 point ROS was performed and is otherwise negative other than mentioned in the HPI    Objective  PE:  Vitals:    01/07/22 1531   BP: 127/77   Pulse: 75   Resp: 18   Temp: 36.9 ??C (98.4 ??F)   SpO2: 98%     General: well-appearing in NAD  Eyes: EOMI, sclera clear, PERRL  ENT: OP clear w/o erythema or exudate  CV: regular, no murmurs  Resp: CTAB, no wheezes or crackles, normal WOB  GI: soft, NTND, NABS  MSK: full ROM, no deformity noted  Skin: clean and dry, no rashes or lesions noted  Ext: no cyanosis/clubbing/edema  Neuro: alert, follows commands. CN II-XII grossly intact    Procedure: None  See procedure note from this encounter    _______________________________________________________________________________________      Same Day Metric Tracker:    Same Day Metric Tracker:  Referral source for today's visit:  General Internal Medicine    Referral to other outpatient urgent services? N/A    Did today's visit result in referral to ED or direct admission? No

## 2022-01-10 ENCOUNTER — Institutional Professional Consult (permissible substitution): Admit: 2022-01-10 | Discharge: 2022-01-11 | Payer: MEDICARE | Attending: Registered" | Primary: Registered"

## 2022-01-10 DIAGNOSIS — E663 Overweight: Principal | ICD-10-CM

## 2022-01-10 NOTE — Unmapped (Signed)
Nutrition Goals:  1. Start to wake up 15 minutes early to pack snacks and eat breakfast to increase overall calorie intake.  2. Increasing calories between breakfast and dinner such as granola bars, mixed nuts, peanut butter sandwich.  3. Balanced meals/the plate method:  1/2 plate vegetables  1/4 plate lean protein  1/4 plate whole-grain carbohydraate  4. Healthier snack ideas (protein/fiber-rich)  -protein bars (Pure Protein OR [facility] Protein)- no more than 5g added sugar with at least 10g protein  -mixed nuts/peanuts  -various fruits with peanut butter  -Muscle Milk  -Premier Protein Shake  5. Incorporate more whole-grains into daily intake  -whole-wheat bread  -brown rice  -whole-grain cereals  6. Consistent meal schedule-try to avoid skipping meals/eating every 3/4 hours.

## 2022-01-10 NOTE — Unmapped (Signed)
[facility]  Adult Medical Nutrition Therapy - Nutrition Follow-Up Visit  Date: 01/10/2022    Patient Name:             Timothy Tapia    Date of Birth / Age / Birth Gender:            [birth year] / 56 y.o. / Male    Referring MD or Clinic:             Minerva Fester, MD      Interpreter:            No      Reason for Referral:   General, healthful diet order yes: weight management, weight reduction and diabetes      Nutritional Concerns/Comments:  56 y.o. male seeking nutritional counseling for weight management, weight reduction and diabetes.    Pt reports to follow-up nutrition visit with BMI of 25.75 kg/m2 (overweight) with a history of COPD, GERD, ETOH abuse, DVT, HTN, barrett esophagus seeking nutritional counseling to mitigate elevated glucose levels.      Since last nutrition visit, pt has gained an additional ~3 lbs and reports a weight of 150 lbs. Pt would like to gain more weight but his work schedule and meal planning continues to be a barrier to adherence.     At today's visit, RD educated pt on the following:  -packing protein-rich snacks for the workplace  -meal planning/prepping for breakfast meals  -using my plate guidelines  -caloric-rich additives    DYSGLYCEMIA: Elevated at 7.0%. Started metformin today (12/11/2021) 500mg  daily- no side-effects.RD encouraged stable meal schedule, reduction of processed carbohydrate intake and increasing protein/fiber intake.    Component Ref Range & Units 11/13/21 1622 08/13/21 1002 12/11/20 1109 06/12/20 1129     Hemoglobin A1C 4.8 - 5.6 % 7.0 High  6.5 High  5.9 High  6.5 High     Estimated Average Glucose mg/dL [phone]       DIET: Pt will skip breakfast or have a granola bar. For dinner, pt had chicken, mashed potatoes and salad. Beverages include water.    PHYSICAL ACTIVITY: Pt walks to and from work daily (30 minutes).    FOOD INSECURITY: Pt screens negative. Pt receives SNAP benefits ($200). Pt receives food from [employer].    Anthropometric Data:   Height:   5'4   Weight:   150 lbs   BMI:   25.75 kg/m2                Weight History:               Wt Readings from Last 6 Encounters:   01/07/22 68 kg (150 lb)   12/24/21 68 kg (150 lb)   12/10/21 66.8 kg (147 lb 3.2 oz)   11/13/21 66.4 kg (146 lb 6.4 oz)   11/08/21 72.6 kg (160 lb)   09/14/21 70.8 kg (156 lb)       Usual body weight: 160 lbs  Ideal Body Weight:  130 lbs  Goal weight: 133 lbs      Physical Findings Data:               BP Today: n/a (telephone visit)                 BP History:                 BP Readings from Last 6 Encounters:  01/07/22 127/77   12/24/21 131/77   12/10/21 118/77   11/13/21 123/74   11/08/21 138/80   09/14/21 138/78       Biochemical Data:              A1C:    HGB A1C, POC   Date Value Ref Range Status   03/31/2020 8.7 (H) <7.0 % Final     Comment:     A1c Glycemic Goal: <7.0%     **Goals should be individualized; more or less stringent A1c glycemic goals may be appropriate for individual patients.      (Adopted from: 2020 ADA Standards of Medical Care In Diabetes)  Point of Care A1c testing is not FDA-approved for the diagnosis of Diabetes.     Hemoglobin A1C   Date Value Ref Range Status   11/13/2021 7.0 (H) 4.8 - 5.6 % Final                 Estimated Blood Glucose:   Lab Results   Component Value Date    Estimated Average Glucose 154 11/13/2021    EST AVERAGE GLUCOSE, POC 203 03/31/2020                Total Cholesterol:   Cholesterol   Date Value Ref Range Status   06/18/2021 190 <=200 mg/dL Final     Cholesterol, Total   Date Value Ref Range Status   02/15/2013 208 (H) 100 - 199 mg/dL Final                Trig:   Triglycerides   Date Value Ref Range Status   06/18/2021 163 (H) 0 - 150 mg/dL Final   84/13/2440 102 1 - 149 mg/dL Final                 HDL:   HDL   Date Value Ref Range Status   06/18/2021 42 40 - 60 mg/dL Final   72/53/6644 79 (H) 40 - 59 mg/dL Final                  LDL:   LDL Calculated   Date Value Ref Range Status 06/18/2021 115 (H) 40 - 99 mg/dL Final     Comment:     NHLBI Recommended Ranges, LDL Cholesterol, for Adults (20+yrs) (ATPIII), mg/dL  Optimal              <034  Near Optimal        100-129  Borderline High     130-159  High                160-189  Very High            >=190  NHLBI Recommended Ranges, LDL Cholesterol, for Children (2-19 yrs), mg/dL  Desirable            <742  Borderline High     110-129  High                 >=130       LDL Cholesterol, Calculated   Date Value Ref Range Status   02/15/2013 100 mg/dL Final     Comment:     :  ADULTS (20 years or older)  Optimal         <100  Near Optimal    100-129  Borderline High 130-159  High            160-189  Very High       >=  190  CHILDREN (2-19 years)  Desirable       <110  Borderline High 110-129  High            >/= 130       Current Medications, Herbs, Supplements:    Current Outpatient Medications:   ???  amLODIPine (NORVASC) 5 MG tablet, TAKE ONE TABLET BY MOUTH DAILY, Disp: 30 tablet, Rfl: 1  ???  betamethasone, augmented, (DIPROLENE) 0.05 % ointment, Apply twice daily to active areas until smooth., Disp: 150 g, Rfl: 5  ???  cetirizine (ZYRTEC) 10 MG tablet, TAKE ONE TABLET BY MOUTH EVERY DAY, Disp: 30 tablet, Rfl: 2  ???  clotrimazole (MYCELEX) 10 mg troche, Take 1 tablet (10 mg total) by mouth five (5) times a day., Disp: 70 Troche, Rfl: 0  ???  CREON 24,000-76,000 -120,000 unit CpDR delayed release capsule, TAKE 1 CAPSULE BY MOUTH THREE TIMES DAILY WITH A MEAL, Disp: 90 capsule, Rfl: 5  ???  dupilumab (DUPIXENT PEN) 300 mg/2 mL PnIj, Inject the contents of 1 pen (300 mg total) under the skin once a week., Disp: 8 mL, Rfl: 6  ???  empty container Misc, Use as directed to dispose of Dupixent pens., Disp: 1 each, Rfl: 2  ???  enalapril (VASOTEC) 20 MG tablet, TAKE ONE TABLET BY MOUTH EVERY DAY, Disp: 90 tablet, Rfl: 2  ???  fluticasone propionate (FLONASE) 50 mcg/actuation nasal spray, Use 1 spray into each nostril daily., Disp: 16 g, Rfl: 0  ???  gabapentin (NEURONTIN) 300 MG capsule, Take 3 capsules (900 mg total) by mouth two (2) times a day., Disp: 270 capsule, Rfl: 6  ???  halobetasol (ULTRAVATE) 0.05 % ointment, APPLY TO AFFECTED AREA TWICE A DAY AS NEEDED AVOID FACE AND FOLDS, Disp: 30 g, Rfl: 1  ???  hydrOXYzine (ATARAX) 25 MG tablet, Take 1 tablet (25 mg total) by mouth at bedtime. Prn pruritus, Disp: 30 tablet, Rfl: 0  ???  Magic Mouthwash (nystatin/diphenhydramine/mylanta) Oral Mixture, Take 10 mL by mouth every six (6) hours as needed. Swish 30 seconds, gargle, spit as needed., Disp: 240 mL, Rfl: 0  ???  meclizine (ANTIVERT) 25 mg tablet, Take 1 tablet (25 mg total) by mouth Three (3) times a day as needed., Disp: 30 tablet, Rfl: 3  ???  metFORMIN (GLUCOPHAGE) 500 MG tablet, Take 1 tablet (500 mg total) by mouth in the morning., Disp: 90 tablet, Rfl: 3  ???  methylPREDNISolone (MEDROL DOSEPACK) 4 mg tablet, follow package directions, Disp: 1 each, Rfl: 0  ???  mirtazapine (REMERON) 30 MG tablet, TAKE ONE TABLET BY MOUTH EVERY DAY, Disp: 30 tablet, Rfl: 6  ???  pantoprazole (PROTONIX) 20 MG tablet, Take 1 tablet (20 mg total) by mouth daily., Disp: 90 tablet, Rfl: 3  ???  permethrin (ELIMITE) 5 % cream, Apply to entire body for 8 to 14 hours then rinse off, then repeat 1 week later, Disp: 60 g, Rfl: 0  ???  PREZCOBIX 800-150 mg-mg tablet, TAKE ONE TABLET BY MOUTH DAILY, Disp: 30 tablet, Rfl: 6  ???  TIVICAY 50 mg TABLET, TAKE ONE TABLET BY MOUTH DAILY, Disp: 30 tablet, Rfl: 6  ???  triamcinolone (KENALOG) 0.1 % ointment, Apply to itchy spots 2 times daily. For elbows at night time, apply medicine and then cover with saran wrap., Disp: 454 g, Rfl: 0  ???  VENTOLIN HFA 90 mcg/actuation inhaler, INHALE 2 PUFFS UP TO FOUR TIMES DAILY AS NEEDED, Disp: 18 g, Rfl: 11    Current  Facility-Administered Medications:   ???  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 For each statement, please tell me whether the statement was often true, sometimes true, or never true for your household in the last month.     1. ???We worried whether our food would run out before we got money to buy more.???   Never True     2. ???The food that we bought just didn't last, and we didn't have money to get more.  Never True       Usual Daily Food Choices:      24-Hour Recall/Usual Intake:  Time Intake   Breakfast    Snack (AM)    Lunch    Snack (PM)    Dinner Chicken, mashed potatoes, salad   Snack (HS)      Food and Nutrient Intake:  Snacks:  Chips, cookies, chips  Beverages:  Tea, sparking water  Dining Out:  n/a  Cooking Methods: bbq, grilling, baking  Usual Food Choices: fruit, cookies, chips, ice cream   Meal Schedule:  irregular    Behavioral Factors:  Overeating: Denied issues with overeating.   Emotional Eating: No issues noted.   Grazing: Denied issues.   Fast Eating: Denied issues.   Nighttime Eating: Nighttime overeating.    Factors Affecting Food Intake:  Knowledge and Beliefs: He presents with food and nutrition-related knowledge deficit. He has not previously met with dietitian or participated in nutrition program.   Stress/Anxiety: Did not report issues with stress/anxiety.   Sleep Patterns: Did not report issues with sleeping habits and patterns.  Food Safety and Access: No issues noted.  Other: n/a        Food Intolerances/Dietary Restrictions:  No known food allergies or food intolerances.     Other GI Issues:  Heartburn    Hunger and Satiety:  Denied issues.       Allergies:  is allergic to sulfa (sulfonamide antibiotics) and sulfur.      Usual Daily Physical Activity Factors: >/=1.0 to <1.4 (Sedentary)  Method for estimating physical activity factor: 2015-2020 DRI Guidelines    Estimated Adherence:  Client self-reported adherence score 8  Method for estimating adherence: Adult Meducation Readiness Ruler      NUTRITION DIAGNOSIS:   Inadequate  energy intake   Inadequate  intake of grains  Inadequate  intake of fruits  Inadequate  intake of vegetables  Inadequate  intake of milk/milk products  Inadequate  intake of meat, poultry, fish, eggs, beans, nut products    Method for estimating nutritional adequacy: 2015-2020 DRI Guidelines     Clinical Diagnosis:   Excessive Carbohydrate Intake as related to nutrition-knowledge deficit, cravings for sugary foods as evidenced by most recent hbA1c of 7.0% and self-reported high intake of processed carbohydrates.  NUTRITION INTERVENTIONS/PLAN OF CARE    Nutrition Counseling     Nutrition Education     Behavior therapy strategies of self-monitoring food intake, physical activity and goal setting.    Coordination of care via Mail and Medical Record    Client agreed to follow interventions:  Yes       Nutrition-Related Medication Management: Nutrition-related complimentary/alternative medicine      Nutrition Goals:  1. Start to wake up 15 minutes early to pack snacks and eat breakfast to increase overall calorie intake.  2. Increasing calories between breakfast and dinner such as granola bars, mixed nuts, peanut butter sandwich.  3. Balanced meals/the plate method:  1/2 plate vegetables  1/4 plate lean protein  1/4 plate whole-grain carbohydraate  4. Healthier snack ideas (protein/fiber-rich)  -protein bars (Pure Protein OR [facility] Protein)- no more than 5g added sugar with at least 10g protein  -mixed nuts/peanuts  -various fruits with peanut butter  -Muscle Milk  -Premier Protein Shake  5. Incorporate more whole-grains into daily intake  -whole-wheat bread  -brown rice  -whole-grain cereals  6. Consistent meal schedule-try to avoid skipping meals/eating every 3/4 hours.      Patient Education:  Individualized nutrition counseling and education provided on:   Macronutrient Pairing  The Plate Method  Protein Sources  Meal Schedule    Materials Provided:  Patient instructions: nutrition-related recommendations   Handouts supporting dietary recommendations:     Additional Materials:   Written nutrition tips , Online resources         NUTRITION MONITORING AND EVALUATION:  Follow-up appointment: telephone/video in 4 weeks (August 10th at 300pm)     Goals for next appointment:  1. Start to wake up 15 minutes early to pack snacks and eat breakfast to increase overall calorie intake.  2. Increasing calories between breakfast and dinner such as granola bars, mixed nuts, peanut butter sandwich.  3. Balanced meals/the plate method:  1/2 plate vegetables  1/4 plate lean protein  1/4 plate whole-grain carbohydraate  4. Healthier snack ideas (protein/fiber-rich)  -protein bars (Pure Protein OR [facility] Protein)- no more than 5g added sugar with at least 10g protein  -mixed nuts/peanuts  -various fruits with peanut butter  -Muscle Milk  -Premier Protein Shake  5. Incorporate more whole-grains into daily intake  -whole-wheat bread  -brown rice  -whole-grain cereals  6. Consistent meal schedule-try to avoid skipping meals/eating every 3/4 hours.      Number of RD Visits: 6 of 14 visits    Length of visit was: 14 minutes      **Pt has Medicare as insurance. It does not meet criteria for billing services for this nutritional service. Therefore it will be covered under the [facility]**          The patient reports they are currently: at home. I spent 14 minutes on the phone with the patient on the date of service. I spent an additional 4 minutes on pre- and post-visit activities on the date of service.     The patient was physically located in [place] or a state in which I am permitted to provide care. The patient and/or parent/guardian understood that s/he may incur co-pays and cost sharing, and agreed to the telemedicine visit. The visit was reasonable and appropriate under the circumstances given the patient's presentation at the time.    The patient and/or parent/guardian has been advised of the potential risks and limitations of this mode of treatment (including, but  not limited to, the absence of in-person examination) and has agreed to be treated using telemedicine. The patient's/patient's family's questions regarding telemedicine have been answered.     If the visit was completed in an ambulatory setting, the patient and/or parent/guardian has also been advised to contact their provider???s office for worsening conditions, and seek emergency medical treatment and/or call 911 if the patient deems either necessary.      Signed:  Unk Pinto  01/10/2022 3:07 PM

## 2022-01-17 NOTE — Unmapped (Signed)
[facility] Refill Coordination Note    Specialty Medication(s) to be Shipped:   Inflammatory Disorders: Dupixent    Other medication(s) to be shipped: No additional medications requested for fill at this time     Timothy Tapia, DOB: 08/12/1965  Phone: [phone] (home) [phone] (work)      All above HIPAA information was verified with patient.     Was a Nurse, learning disability used for this call? No    Completed refill call assessment today to schedule patient's medication shipment from the [facility] [phone]).  All relevant notes have been reviewed.     Specialty medication(s) and dose(s) confirmed: Regimen is correct and unchanged.   Changes to medications: [name] reports no changes at this time.  Changes to insurance: No  New side effects reported not previously addressed with a pharmacist or physician: None reported  Questions for the pharmacist: No    Confirmed patient received a Conservation officer, historic buildings and a Surveyor, mining with first shipment. The patient will receive a drug information handout for each medication shipped and additional FDA Medication Guides as required.       DISEASE/MEDICATION-SPECIFIC INFORMATION        For patients on injectable medications: Patient currently has 2 doses left.  Next injection is scheduled for 01/17/22.    SPECIALTY MEDICATION ADHERENCE     Medication Adherence    Patient reported X missed doses in the last month: 0  Specialty Medication: DUPIXENT  Patient is on additional specialty medications: No              Were doses missed due to medication being on hold? No    Dupixent 300/2 mg/ml: 7 days of medicine on hand        REFERRAL TO PHARMACIST     Referral to the pharmacist: Not needed      [facility]     Shipping address confirmed in Epic.     Delivery Scheduled: Yes, Expected medication delivery date: 01/24/22.     Medication will be delivered via Same Day Courier to the prescription address in Epic WAM.    Unk Lightning   [facility] Specialty Technician

## 2022-01-21 NOTE — Unmapped (Unsigned)
Referral Services Note     Duration of Intervention: 15 minutes    TYPE OF CONTACT: Text    ASSESSMENT: Patient inquired about vision resources.     INTERVENTION:  SW followed up with resources provided, patient confirmed receiving resources . Patient stated that he did not have a way to attend appointment and asked if the ID clinic could provide him with glasses. SW informed patient that the ID clinic does not provide glasses, but he could utilize resources that this SW provided.  Patient requested for SW to resend options. SW resent patient the following information:     [facility]  Address: [address], [place], [place] [address]  Phone: [phone]    [facility]  [address]  [place], [place] [address]  Phone: [phone]      [facility]  [address]  [place], [place] [address]  Phone: [phone]      [facility] [phone] [place] [address] [place], [place] [address]  Phone: [phone]    PLAN:  Patient will follow up with resources provided and continue to inform SW with any other questions or concerns.     Laird Runnion, MSW  [facility]

## 2022-01-23 MED ORDER — AMLODIPINE 5 MG TABLET
ORAL_TABLET | 11 refills | 0 days | Status: CP
Start: 2022-01-23 — End: ?

## 2022-01-23 NOTE — Unmapped (Signed)
Medication Requested: amlodipine      Future Appointments   Date Time Provider Department Center   02/07/2022  3:00 PM Darlys Gales [facility]   03/18/2022  1:00 PM Minerva Fester, MD [facility]     Per Provider Note: on current med list but uncertain if has been discontinued    Standing order protocol requirements met?: Yes    Sent to: Provider for signing    Days Supply Given: 0  Number of Refills: 0

## 2022-01-24 MED FILL — DUPIXENT 300 MG/2 ML SUBCUTANEOUS PEN INJECTOR: SUBCUTANEOUS | 28 days supply | Qty: 8 | Fill #2

## 2022-02-05 ENCOUNTER — Institutional Professional Consult (permissible substitution): Admit: 2022-02-05 | Discharge: 2022-02-06 | Payer: MEDICARE | Attending: Registered" | Primary: Registered"

## 2022-02-05 DIAGNOSIS — R7303 Prediabetes: Principal | ICD-10-CM

## 2022-02-05 NOTE — Unmapped (Signed)
[facility]  Adult Medical Nutrition Therapy - Nutrition Follow-Up Visit  Date: 02/05/2022    Patient Name:             Timothy Tapia    Date of Birth / Age / Birth Gender:            1966/02/16 / 56 y.o. / Male    Referring MD or Clinic:             Self, Referred      Interpreter:            No      Reason for Referral:   General, healthful diet order yes: weight management, weight reduction and diabetes      Nutritional Concerns/Comments:  56 y.o. male seeking nutritional counseling for weight management, weight reduction and diabetes.    Pt reports to follow-up nutrition visit with BMI of 25.75 kg/m2 (overweight) with a history of COPD, GERD, ETOH abuse, DVT, HTN, barrett esophagus seeking nutritional counseling to mitigate elevated glucose levels.      Since last nutrition visit, pt reports marginal adherence to nutrition-related recommendations to gain weight. Pt reports he skips breakfast and avoids snack at the workplace.    At today's visit, RD educated pt on the following:  -packing protein-rich snacks for the workplace  -meal planning/prepping for breakfast meals  -using my plate guidelines  -caloric-rich additives    DYSGLYCEMIA: Elevated at 7.0%. Started metformin (12/11/2021) 500mg  daily- no side-effects.RD continues to encourage stable meal schedule, reduction of processed carbohydrate intake and increasing protein/fiber intake.    Component Ref Range & Units 11/13/21 1622 08/13/21 1002 12/11/20 1109 06/12/20 1129     Hemoglobin A1C 4.8 - 5.6 % 7.0 High  6.5 High  5.9 High  6.5 High     Estimated Average Glucose mg/dL [phone] 409       DIET: Pt typically skips breakfast. For dinner, pt had chicken, vegetables and a starch. Snacks include: granola bar.     PHYSICAL ACTIVITY: Pt bikes to and from work daily (30 minutes).    FOOD INSECURITY: Pt screens negative. Pt receives SNAP benefits ($200). Pt receives food from [employer].    Anthropometric Data:   Height: 5'4   Weight:   150 lbs   BMI:   25.75 kg/m2                Weight History:               Wt Readings from Last 6 Encounters:   01/07/22 68 kg (150 lb)   12/24/21 68 kg (150 lb)   12/10/21 66.8 kg (147 lb 3.2 oz)   11/13/21 66.4 kg (146 lb 6.4 oz)   11/08/21 72.6 kg (160 lb)   09/14/21 70.8 kg (156 lb)       Usual body weight: 160 lbs  Ideal Body Weight:  130 lbs  Goal weight: 133 lbs      Physical Findings Data:               BP Today: n/a (telephone visit)                 BP History:                 BP Readings from Last 6 Encounters:   01/07/22 127/77   12/24/21 131/77   12/10/21 118/77   11/13/21 123/74   11/08/21 138/80   09/14/21  138/78       Biochemical Data:              A1C:    HGB A1C, POC   Date Value Ref Range Status   03/31/2020 8.7 (H) <7.0 % Final     Comment:     A1c Glycemic Goal: <7.0%     **Goals should be individualized; more or less stringent A1c glycemic goals may be appropriate for individual patients.      (Adopted from: 2020 ADA Standards of Medical Care In Diabetes)  Point of Care A1c testing is not FDA-approved for the diagnosis of Diabetes.     Hemoglobin A1C   Date Value Ref Range Status   11/13/2021 7.0 (H) 4.8 - 5.6 % Final                 Estimated Blood Glucose:   Lab Results   Component Value Date    Estimated Average Glucose 154 11/13/2021    EST AVERAGE GLUCOSE, POC 203 03/31/2020                Total Cholesterol:   Cholesterol   Date Value Ref Range Status   06/18/2021 190 <=200 mg/dL Final     Cholesterol, Total   Date Value Ref Range Status   02/15/2013 208 (H) 100 - 199 mg/dL Final                Trig:   Triglycerides   Date Value Ref Range Status   06/18/2021 163 (H) 0 - 150 mg/dL Final   16/04/9603 540 1 - 149 mg/dL Final                 HDL:   HDL   Date Value Ref Range Status   06/18/2021 42 40 - 60 mg/dL Final   98/05/9146 79 (H) 40 - 59 mg/dL Final                  LDL:   LDL Calculated   Date Value Ref Range Status   06/18/2021 115 (H) 40 - 99 mg/dL Final     Comment: NHLBI Recommended Ranges, LDL Cholesterol, for Adults (20+yrs) (ATPIII), mg/dL  Optimal              <829  Near Optimal        100-129  Borderline High     130-159  High                160-189  Very High            >=190  NHLBI Recommended Ranges, LDL Cholesterol, for Children (2-19 yrs), mg/dL  Desirable            <562  Borderline High     110-129  High                 >=130       LDL Cholesterol, Calculated   Date Value Ref Range Status   02/15/2013 100 mg/dL Final     Comment:     :  ADULTS (20 years or older)  Optimal         <100  Near Optimal    100-129  Borderline High 130-159  High            160-189  Very High       >=190  CHILDREN (2-19 years)  Desirable       <110  Borderline High 110-129  High            >/=  130       Current Medications, Herbs, Supplements:    Current Outpatient Medications:   ???  amLODIPine (NORVASC) 5 MG tablet, TAKE ONE TABLET BY MOUTH DAILY, Disp: 30 tablet, Rfl: 11  ???  betamethasone, augmented, (DIPROLENE) 0.05 % ointment, Apply twice daily to active areas until smooth., Disp: 150 g, Rfl: 5  ???  cetirizine (ZYRTEC) 10 MG tablet, TAKE ONE TABLET BY MOUTH EVERY DAY, Disp: 30 tablet, Rfl: 2  ???  clotrimazole (MYCELEX) 10 mg troche, Take 1 tablet (10 mg total) by mouth five (5) times a day., Disp: 70 Troche, Rfl: 0  ???  CREON 24,000-76,000 -120,000 unit CpDR delayed release capsule, TAKE 1 CAPSULE BY MOUTH THREE TIMES DAILY WITH A MEAL, Disp: 90 capsule, Rfl: 5  ???  dupilumab (DUPIXENT PEN) 300 mg/2 mL PnIj, Inject the contents of 1 pen (300 mg total) under the skin once a week., Disp: 8 mL, Rfl: 6  ???  empty container Misc, Use as directed to dispose of Dupixent pens., Disp: 1 each, Rfl: 2  ???  enalapril (VASOTEC) 20 MG tablet, TAKE ONE TABLET BY MOUTH EVERY DAY, Disp: 90 tablet, Rfl: 2  ???  fluticasone propionate (FLONASE) 50 mcg/actuation nasal spray, Use 1 spray into each nostril daily., Disp: 16 g, Rfl: 0  ???  gabapentin (NEURONTIN) 300 MG capsule, Take 3 capsules (900 mg total) by mouth two (2) times a day., Disp: 270 capsule, Rfl: 6  ???  halobetasol (ULTRAVATE) 0.05 % ointment, APPLY TO AFFECTED AREA TWICE A DAY AS NEEDED AVOID FACE AND FOLDS, Disp: 30 g, Rfl: 1  ???  hydrOXYzine (ATARAX) 25 MG tablet, Take 1 tablet (25 mg total) by mouth at bedtime. Prn pruritus, Disp: 30 tablet, Rfl: 0  ???  Magic Mouthwash (nystatin/diphenhydramine/mylanta) Oral Mixture, Take 10 mL by mouth every six (6) hours as needed. Swish 30 seconds, gargle, spit as needed., Disp: 240 mL, Rfl: 0  ???  meclizine (ANTIVERT) 25 mg tablet, Take 1 tablet (25 mg total) by mouth Three (3) times a day as needed., Disp: 30 tablet, Rfl: 3  ???  metFORMIN (GLUCOPHAGE) 500 MG tablet, Take 1 tablet (500 mg total) by mouth in the morning., Disp: 90 tablet, Rfl: 3  ???  methylPREDNISolone (MEDROL DOSEPACK) 4 mg tablet, follow package directions, Disp: 1 each, Rfl: 0  ???  mirtazapine (REMERON) 30 MG tablet, TAKE ONE TABLET BY MOUTH EVERY DAY, Disp: 30 tablet, Rfl: 6  ???  pantoprazole (PROTONIX) 20 MG tablet, Take 1 tablet (20 mg total) by mouth daily., Disp: 90 tablet, Rfl: 3  ???  permethrin (ELIMITE) 5 % cream, Apply to entire body for 8 to 14 hours then rinse off, then repeat 1 week later, Disp: 60 g, Rfl: 0  ???  PREZCOBIX 800-150 mg-mg tablet, TAKE ONE TABLET BY MOUTH DAILY, Disp: 30 tablet, Rfl: 6  ???  TIVICAY 50 mg TABLET, TAKE ONE TABLET BY MOUTH DAILY, Disp: 30 tablet, Rfl: 6  ???  triamcinolone (KENALOG) 0.1 % ointment, Apply to itchy spots 2 times daily. For elbows at night time, apply medicine and then cover with saran wrap., Disp: 454 g, Rfl: 0  ???  VENTOLIN HFA 90 mcg/actuation inhaler, INHALE 2 PUFFS UP TO FOUR TIMES DAILY AS NEEDED, Disp: 18 g, Rfl: 11    Current Facility-Administered Medications:   ???  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 For each statement, please tell me whether the statement was often true, sometimes true, or never true for your household in the last month.     1. ???We worried whether our food would run out before we got money to buy more.???   Never True     2. ???The food that we bought just didn't last, and we didn't have money to get more.  Never True       Usual Daily Food Choices:      24-Hour Recall/Usual Intake:  Time Intake   Breakfast    Snack (AM) Granola bar   Lunch    Snack (PM)    Dinner Chicken, mashed potatoes, salad   Snack (HS)      Food and Nutrient Intake:  Snacks:  Chips, cookies, chips  Beverages:  Tea, sparking water  Dining Out:  n/a  Cooking Methods: bbq, grilling, baking  Usual Food Choices: fruit, cookies, chips, ice cream   Meal Schedule:  irregular    Behavioral Factors:  Overeating: Denied issues with overeating.   Emotional Eating: No issues noted.   Grazing: Denied issues.   Fast Eating: Denied issues.   Nighttime Eating: Nighttime overeating.    Factors Affecting Food Intake:  Knowledge and Beliefs: He presents with food and nutrition-related knowledge deficit. He has not previously met with dietitian or participated in nutrition program.   Stress/Anxiety: Did not report issues with stress/anxiety.   Sleep Patterns: Did not report issues with sleeping habits and patterns.  Food Safety and Access: No issues noted.  Other: n/a        Food Intolerances/Dietary Restrictions:  No known food allergies or food intolerances.     Other GI Issues:  Heartburn    Hunger and Satiety:  Denied issues.       Allergies:  is allergic to sulfa (sulfonamide antibiotics) and sulfur.      Usual Daily Physical Activity Factors: >/=1.0 to <1.4 (Sedentary)  Method for estimating physical activity factor: 2015-2020 DRI Guidelines    Estimated Adherence:  Client self-reported adherence score 8  Method for estimating adherence: Adult Meducation Readiness Ruler      NUTRITION DIAGNOSIS:   Inadequate  energy intake   Inadequate  intake of grains  Inadequate  intake of fruits  Inadequate  intake of vegetables  Inadequate  intake of milk/milk products  Inadequate  intake of meat, poultry, fish, eggs, beans, nut products    Method for estimating nutritional adequacy: 2015-2020 DRI Guidelines     Clinical Diagnosis:   Excessive Carbohydrate Intake as related to nutrition-knowledge deficit, cravings for sugary foods as evidenced by most recent hbA1c of 7.0% and self-reported high intake of processed carbohydrates.            NUTRITION INTERVENTIONS/PLAN OF CARE    Nutrition Counseling     Nutrition Education     Behavior therapy strategies of self-monitoring food intake, physical  activity and goal setting.    Coordination of care via Mail and Medical Record    Client agreed to follow interventions:  Yes       Nutrition-Related Medication Management: Nutrition-related complimentary/alternative medicine      Nutrition Goals:  1. Start to wake up 15 minutes early to eat breakfast to increase overall calorie intake.  2. Increasing calories between breakfast and dinner by including a protein shake, granola bars, mixed nuts, peanut butter sandwich.  3. Balanced meals/the plate method:  1/2 plate vegetables  1/4 plate lean protein  1/4 plate whole-grain carbohydraate  4. Healthier snack ideas (protein/fiber-rich)  -protein bars (Pure Protein OR [facility] Protein)- no more than 5g added sugar with at least 10g protein  -mixed nuts/peanuts  -various fruits with peanut butter  -Muscle Milk  -Premier Protein Shake  5. Incorporate more whole-grains into daily intake  -whole-wheat bread  -brown rice  -whole-grain cereals  6. Consistent meal schedule-try to avoid skipping meals/eating every 3/4 hours.      Patient Education:  Individualized nutrition counseling and education provided on:   Macronutrient Pairing  The Plate Method  Protein Sources  Meal Schedule    Materials Provided:  Patient instructions: nutrition-related recommendations   Handouts supporting dietary recommendations: n/a    Additional Materials:   Written nutrition tips , Online resources         NUTRITION MONITORING AND EVALUATION:  Follow-up appointment: telephone/video in 4 weeks (September 6th at 245pm)    Goals for next appointment:  1. Start to wake up 15 minutes early to eat breakfast to increase overall calorie intake.  2. Increasing calories between breakfast and dinner by including a protein shake, granola bars, mixed nuts, peanut butter sandwich.  3. Balanced meals/the plate method:  1/2 plate vegetables  1/4 plate lean protein  1/4 plate whole-grain carbohydraate  4. Healthier snack ideas (protein/fiber-rich)  -protein bars (Pure Protein OR [facility] Protein)- no more than 5g added sugar with at least 10g protein  -mixed nuts/peanuts  -various fruits with peanut butter  -Muscle Milk  -Premier Protein Shake  5. Incorporate more whole-grains into daily intake  -whole-wheat bread  -brown rice  -whole-grain cereals  6. Consistent meal schedule-try to avoid skipping meals/eating every 3/4 hours.    Number of RD Visits: 7 of 14 visits    Length of visit was:  15 minutes      **Pt has Medicare as insurance. It does not meet criteria for billing services for this nutritional service. Therefore it will be covered under the [facility]**          The patient reports they are currently: at home. I spent 15 minutes on the phone with the patient on the date of service. I spent an additional 3 minutes on pre- and post-visit activities on the date of service.     The patient was physically located in [place] or a state in which I am permitted to provide care. The patient and/or parent/guardian understood that s/he may incur co-pays and cost sharing, and agreed to the telemedicine visit. The visit was reasonable and appropriate under the circumstances given the patient's presentation at the time.    The patient and/or parent/guardian has been advised of the potential risks and limitations of this mode of treatment (including, but not limited to, the absence of in-person examination) and has agreed to be treated using telemedicine. The patient's/patient's family's questions regarding telemedicine have been answered.  If the visit was completed in an ambulatory setting, the patient and/or parent/guardian has also been advised to contact their provider???s office for worsening conditions, and seek emergency medical treatment and/or call 911 if the patient deems either necessary.      Signed:  Unk Pinto  02/05/2022 3:03 PM

## 2022-02-05 NOTE — Unmapped (Signed)
Nutrition Goals:  1. Start to wake up 15 minutes early to eat breakfast to increase overall calorie intake.  2. Increasing calories between breakfast and dinner by including a protein shake, granola bars, mixed nuts, peanut butter sandwich.  3. Balanced meals/the plate method:  1/2 plate vegetables  1/4 plate lean protein  1/4 plate whole-grain carbohydraate  4. Healthier snack ideas (protein/fiber-rich)  -protein bars (Pure Protein OR [facility] Protein)- no more than 5g added sugar with at least 10g protein  -mixed nuts/peanuts  -various fruits with peanut butter  -Muscle Milk  -Premier Protein Shake  5. Incorporate more whole-grains into daily intake  -whole-wheat bread  -brown rice  -whole-grain cereals  6. Consistent meal schedule-try to avoid skipping meals/eating every 3/4 hours.

## 2022-02-15 NOTE — Unmapped (Signed)
[facility] Refill Coordination Note    Specialty Medication(s) to be Shipped:   Inflammatory Disorders: Dupixent    Other medication(s) to be shipped: No additional medications requested for fill at this time     Timothy Tapia, DOB: 1965-12-16  Phone: [phone] (home) [phone] (work)      All above HIPAA information was verified with patient.     Was a Nurse, learning disability used for this call? No    Completed refill call assessment today to schedule patient's medication shipment from the [facility] [phone]).  All relevant notes have been reviewed.     Specialty medication(s) and dose(s) confirmed: Regimen is correct and unchanged.   Changes to medications: Anthonymichael reports no changes at this time.  Changes to insurance: No  New side effects reported not previously addressed with a pharmacist or physician: None reported  Questions for the pharmacist: No    Confirmed patient received a Conservation officer, historic buildings and a Surveyor, mining with first shipment. The patient will receive a drug information handout for each medication shipped and additional FDA Medication Guides as required.       DISEASE/MEDICATION-SPECIFIC INFORMATION        For patients on injectable medications: Patient currently has 1 doses left.  Next injection is scheduled for 02/21/2022.    SPECIALTY MEDICATION ADHERENCE     Medication Adherence    Patient reported X missed doses in the last month: 0  Specialty Medication: Dupixent  Patient is on additional specialty medications: No  Any gaps in refill history greater than 2 weeks in the last 3 months: no  Demonstrates understanding of importance of adherence: yes  Informant: patient  Reliability of informant: reliable              Confirmed plan for next specialty medication refill: delivery by pharmacy  Refills needed for supportive medications: not needed              Were doses missed due to medication being on hold? No    Dupixent 300/2 mg/ml: 7 days of medicine on hand        REFERRAL TO PHARMACIST     Referral to the pharmacist: Not needed      [facility]     Shipping address confirmed in Epic.     Delivery Scheduled: Yes, Expected medication delivery date: 02/20/2022.     Medication will be delivered via Same Day Courier to the prescription address in Epic WAM.    Valere Dross   [facility] Specialty Technician

## 2022-02-17 MED ORDER — ENALAPRIL MALEATE 20 MG TABLET
ORAL_TABLET | 2 refills | 0 days
Start: 2022-02-17 — End: ?

## 2022-02-18 MED ORDER — ENALAPRIL MALEATE 20 MG TABLET
ORAL_TABLET | 2 refills | 0 days | Status: CP
Start: 2022-02-18 — End: ?

## 2022-02-18 NOTE — Unmapped (Signed)
Medication Requested:VASOTEC       Future Appointments   Date Time Provider Department Center   03/18/2022  1:00 PM Minerva Fester, MD [facility]     Per Provider Note: On ACEi plus Ca++ bloocker.     Standing order protocol requirements met?: Yes    Sent to: Pharmacy per protocol    Days Supply Given: 30 days  Number of Refills: 1

## 2022-02-19 NOTE — Unmapped (Signed)
Referral Services Note     Duration of Intervention: 10 minutes    TYPE OF CONTACT: Phone    ASSESSMENT: Patient stated that he needed assistance with his copay for [facility]. Patient stated that his copay was $4.    INTERVENTION:  SW provided active listening, validation, empathic feedback, unconditional positive regard, and congruence within the relationship.      PLAN:  SW will follow up with patient at a later date.     Charna Neeb, MSW  [facility] [employer] [facility]

## 2022-02-20 MED FILL — DUPIXENT 300 MG/2 ML SUBCUTANEOUS PEN INJECTOR: SUBCUTANEOUS | 28 days supply | Qty: 8 | Fill #3

## 2022-02-21 ENCOUNTER — Ambulatory Visit: Admit: 2022-02-21 | Discharge: 2022-02-22 | Payer: MEDICARE

## 2022-02-22 MED ORDER — PANTOPRAZOLE 20 MG TABLET,DELAYED RELEASE
ORAL_TABLET | Freq: Every day | ORAL | 3 refills | 90 days | Status: CP
Start: 2022-02-22 — End: ?

## 2022-02-22 NOTE — Unmapped (Signed)
Medication Requested: protonix      Future Appointments   Date Time Provider Department Center   03/18/2022  1:00 PM Minerva Fester, MD [facility]     Per Provider Note: On PPI     Standing order protocol requirements met?: Yes    Sent to: Pharmacy per protocol    Days Supply Given: 30 days  Number of Refills: 0

## 2022-02-27 MED FILL — MECLIZINE 25 MG TABLET: ORAL | 10 days supply | Qty: 30 | Fill #1

## 2022-03-01 DIAGNOSIS — R6889 Other general symptoms and signs: Principal | ICD-10-CM

## 2022-03-01 MED ORDER — FLUTICASONE PROPIONATE 50 MCG/ACTUATION NASAL SPRAY,SUSPENSION
Freq: Every day | NASAL | 0 refills | 120 days
Start: 2022-03-01 — End: 2023-03-01

## 2022-03-06 MED ORDER — FLUTICASONE PROPIONATE 50 MCG/ACTUATION NASAL SPRAY,SUSPENSION
Freq: Every day | NASAL | 0 refills | 120 days | Status: CP
Start: 2022-03-06 — End: 2023-03-06

## 2022-03-18 ENCOUNTER — Ambulatory Visit: Admit: 2022-03-18 | Discharge: 2022-03-19 | Payer: MEDICARE

## 2022-03-18 ENCOUNTER — Ambulatory Visit
Admit: 2022-03-18 | Discharge: 2022-03-19 | Payer: MEDICARE | Attending: Infectious Disease | Primary: Infectious Disease

## 2022-03-18 DIAGNOSIS — B20 Human immunodeficiency virus [HIV] disease: Principal | ICD-10-CM

## 2022-03-18 DIAGNOSIS — E119 Type 2 diabetes mellitus without complications: Principal | ICD-10-CM

## 2022-03-18 DIAGNOSIS — Z48816 Encounter for surgical aftercare following surgery on the genitourinary system: Principal | ICD-10-CM

## 2022-03-18 LAB — CBC W/ AUTO DIFF
BASOPHILS ABSOLUTE COUNT: 0.1 10*9/L (ref 0.0–0.1)
BASOPHILS RELATIVE PERCENT: 0.8 %
EOSINOPHILS ABSOLUTE COUNT: 0.1 10*9/L (ref 0.0–0.5)
EOSINOPHILS RELATIVE PERCENT: 0.8 %
HEMATOCRIT: 42 % (ref 39.0–48.0)
HEMOGLOBIN: 14 g/dL (ref 12.9–16.5)
LYMPHOCYTES ABSOLUTE COUNT: 2.1 10*9/L (ref 1.1–3.6)
LYMPHOCYTES RELATIVE PERCENT: 24.5 %
MEAN CORPUSCULAR HEMOGLOBIN CONC: 33.4 g/dL (ref 32.0–36.0)
MEAN CORPUSCULAR HEMOGLOBIN: 29.3 pg (ref 25.9–32.4)
MEAN CORPUSCULAR VOLUME: 87.8 fL (ref 77.6–95.7)
MEAN PLATELET VOLUME: 8.1 fL (ref 6.8–10.7)
MONOCYTES ABSOLUTE COUNT: 0.5 10*9/L (ref 0.3–0.8)
MONOCYTES RELATIVE PERCENT: 5.9 %
NEUTROPHILS ABSOLUTE COUNT: 5.7 10*9/L (ref 1.8–7.8)
NEUTROPHILS RELATIVE PERCENT: 68 %
NUCLEATED RED BLOOD CELLS: 0 /100{WBCs} (ref <=4)
PLATELET COUNT: 324 10*9/L (ref 150–450)
RED BLOOD CELL COUNT: 4.78 10*12/L (ref 4.26–5.60)
RED CELL DISTRIBUTION WIDTH: 15.9 % — ABNORMAL HIGH (ref 12.2–15.2)
WBC ADJUSTED: 8.4 10*9/L (ref 3.6–11.2)

## 2022-03-18 LAB — COMPREHENSIVE METABOLIC PANEL
ALBUMIN: 4 g/dL (ref 3.4–5.0)
ALKALINE PHOSPHATASE: 143 U/L — ABNORMAL HIGH (ref 46–116)
ALT (SGPT): 12 U/L (ref 10–49)
ANION GAP: 8 mmol/L (ref 5–14)
AST (SGOT): 14 U/L (ref <=34)
BILIRUBIN TOTAL: 0.3 mg/dL (ref 0.3–1.2)
BLOOD UREA NITROGEN: <5 mg/dL — ABNORMAL LOW (ref 9–23)
CALCIUM: 9.6 mg/dL (ref 8.7–10.4)
CHLORIDE: 105 mmol/L (ref 98–107)
CO2: 26.2 mmol/L (ref 20.0–31.0)
CREATININE: 0.73 mg/dL
EGFR CKD-EPI (2021) MALE: >90 mL/min/{1.73_m2} (ref >=60)
GLUCOSE RANDOM: 101 mg/dL (ref 70–179)
POTASSIUM: 3.9 mmol/L (ref 3.4–4.8)
PROTEIN TOTAL: 7.1 g/dL (ref 5.7–8.2)
SODIUM: 139 mmol/L (ref 135–145)

## 2022-03-18 LAB — LIPASE: LIPASE: 48 U/L (ref 12–53)

## 2022-03-18 LAB — HEMOGLOBIN A1C
ESTIMATED AVERAGE GLUCOSE: 126 mg/dL
HEMOGLOBIN A1C: 6 % — ABNORMAL HIGH (ref 4.8–5.6)

## 2022-03-18 MED ORDER — TADALAFIL 5 MG TABLET
ORAL_TABLET | Freq: Every day | ORAL | 3 refills | 30 days | Status: CP | PRN
Start: 2022-03-18 — End: ?
  Filled 2022-03-29: qty 30, 30d supply, fill #0

## 2022-03-18 MED ORDER — PIOGLITAZONE 15 MG TABLET
ORAL_TABLET | Freq: Every day | ORAL | 3 refills | 90 days | Status: CP
Start: 2022-03-18 — End: 2022-03-18

## 2022-03-18 MED ORDER — CETIRIZINE 10 MG TABLET
ORAL_TABLET | Freq: Every day | ORAL | 11 refills | 30.00000 days | Status: CP
Start: 2022-03-18 — End: 2022-03-18
  Filled 2022-03-18: qty 30, 30d supply, fill #0

## 2022-03-18 NOTE — Unmapped (Signed)
PCP:  Lynnea Ferrier, MD    03/18/2022    This is a RETURN visit for this 56 y.o. male with the following diagnoses:    Patient Active Problem List    Diagnosis Date Noted    Human immunodeficiency virus (HIV) disease (CMS-HCC) 02/04/1995    Chronic bilateral low back pain 11/09/2019    COPD (chronic obstructive pulmonary disease) (CMS-HCC) 05/27/2017    Hypokalemia 05/27/2017    GERD (gastroesophageal reflux disease) 05/27/2017    Pancreatic pseudocyst 07/06/2015    GI bleeding 07/06/2015    ETOH abuse 07/06/2015    DVT (deep venous thrombosis), IMV thrombus 10/07/13 provoked per hematology (33mo therapy) 10/25/2013    Colitis 10/12/2013    Pancreatitis 02/15/2013    Tobacco use disorder 02/15/2013    HTN (hypertension) 02/15/2013    Barrett esophagus 02/15/2013    Asthma 02/15/2013    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  Swears he never misses a dose.   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  [facility] seen [facility]   Intraarticular corticosteroids had helped but no longer.  Now with neck and right shoulder pain.   Gabapentin 900 mg at bedtime is helping he says. Will try to escalate to 900 mg BID slowly. Watch for daytime somnolence.   Check MRI cervical spine  May need Neurosurgery consult  [facility] involved    Hx Pancreatitis - Now quiescent   Old MRI shown that prior pancreatic pseudocyst resolved  On Creon     Barrett's Espohagitis  On PPI  Has some dysphagia  Will ask GI to see and consider repeat EGD given Barretts and symptoms     Smoking   Smoking still  Says trying to stop     Mental Health  See above  On Remeron 30 mg at bedtime      Health Maintenance   Immunizations:  HBV immune. HCV antibody positive but HCV viral load negative so not infected.   C19 fully vaccinated and boosted w bivalent.   Refuses flu shot.   Cancer Screening:   Colonoscopy in 2017. Multiple polyps. Repeat.   Barrett's so EGDs  HTN: Controlled today. Will watch. On ACEi plus Ca++ bloocker.   Derm: Dermatology seeing for Atopic like dermatitis. On Dupixent.     Will follow up in 10-12 months.       Chief Complaint: Routine HIV follow-up    HPI:  Mr. Timothy Tapia returns for a routine appointment. See detailed A/P.     HIV-wise: Mostly stable CD4 and HIV RNA. Reports good adherence to ART.      ROS:   No fever chills, sweats, nausea, vomiting, diarrhea, rash, headache, joint pain. Neg for epigastric pain. Pos for LBP, dysphagia    All other systems are negative.    ALLERGIES:  Sulfa (sulfonamide antibiotics) and Sulfur    MEDICATIONS:  Current Outpatient Medications   Medication Sig Dispense Refill    amLODIPine (NORVASC) 5 MG tablet TAKE ONE TABLET BY MOUTH DAILY 30 tablet 11    betamethasone, augmented, (DIPROLENE) 0.05 % ointment Apply twice daily to active areas until smooth. 150 g 5    CREON 24,000-76,000 -120,000 unit CpDR delayed release capsule TAKE 1 CAPSULE BY MOUTH THREE TIMES DAILY WITH A MEAL 90 capsule 5    dupilumab (DUPIXENT PEN) 300 mg/2 mL PnIj Inject the contents of 1 pen (300 mg total) under the skin once a week. 8 mL 6    enalapril (VASOTEC) 20 MG tablet TAKE ONE TABLET BY MOUTH EVERY DAY 90 tablet 2    gabapentin (NEURONTIN) 300 MG capsule Take 3 capsules (900 mg total) by mouth two (2) times a day. 270 capsule 6    halobetasol (ULTRAVATE) 0.05 % ointment APPLY TO AFFECTED AREA TWICE A DAY AS NEEDED AVOID FACE AND FOLDS 30 g 1    hydrOXYzine (ATARAX) 25 MG tablet Take 1 tablet (25 mg total) by mouth at bedtime. Prn pruritus 30 tablet 0    mirtazapine (REMERON) 30 MG tablet TAKE ONE TABLET BY MOUTH EVERY DAY 30 tablet 6    pantoprazole (PROTONIX) 20 MG tablet TAKE ONE TABLET BY MOUTH EVERY DAY 90 tablet 3    PREZCOBIX 800-150 mg-mg tablet TAKE ONE TABLET BY MOUTH DAILY 30 tablet 6    TIVICAY 50 mg TABLET TAKE ONE TABLET BY MOUTH DAILY 30 tablet 6    triamcinolone (KENALOG) 0.1 % ointment Apply to itchy spots 2 times daily. For elbows at night time, apply medicine and then cover with saran wrap. 454 g 0    VENTOLIN HFA 90 mcg/actuation inhaler INHALE 2 PUFFS UP TO FOUR TIMES DAILY AS NEEDED 18 g 11    cetirizine (ZYRTEC) 10 MG tablet Take 1 tablet (10 mg total) by mouth daily. 30 tablet 11    empty container Misc Use as directed to dispose of Dupixent pens. 1 each 2    pioglitazone (ACTOS) 15 MG tablet Take 1 tablet (15 mg total) by mouth daily. 90 tablet 3    tadalafiL (CIALIS) 5 MG tablet Take 1 tablet (5 mg total) by mouth daily as needed for erectile dysfunction. 30 tablet 3     Current Facility-Administered Medications   Medication Dose Route Frequency Provider Last Rate Last Admin    triamcinolone acetonide (KENALOG-40) injection 40 mg  40 mg Intra-articular Once Evaristo Bury, PA         PE:  A&A. NAD. Chest CTA, Heart RRR. EXT w/o edema    DATA:    Results for orders placed or performed in visit on 03/18/22   Comprehensive Metabolic Panel   Result Value Ref Range    Sodium 139 135 - 145 mmol/L    Potassium 3.9 3.4 - 4.8 mmol/L    Chloride 105 98 - 107 mmol/L    CO2 26.2 20.0 - 31.0 mmol/L Anion Gap 8 5 - 14 mmol/L    BUN <5 (L)  9 - 23 mg/dL    Creatinine 2.59 5.63 - 1.10 mg/dL    eGFR CKD-EPI (8756) Male >90 >=60 mL/min/1.26m2    Glucose 101 70 - 179 mg/dL    Calcium 9.6 8.7 - 43.3 mg/dL    Albumin 4.0 3.4 - 5.0 g/dL    Total Protein 7.1 5.7 - 8.2 g/dL    Total Bilirubin 0.3 0.3 - 1.2 mg/dL    AST 14 <=29 U/L    ALT 12 10 - 49 U/L    Alkaline Phosphatase 143 (H) 46 - 116 U/L   Hemoglobin A1c   Result Value Ref Range    Hemoglobin A1C 6.0 (H) 4.8 - 5.6 %    Estimated Average Glucose 126 mg/dL   CBC w/ Differential   Result Value Ref Range    WBC 8.4 3.6 - 11.2 10*9/L    RBC 4.78 4.26 - 5.60 10*12/L    HGB 14.0 12.9 - 16.5 g/dL    HCT 51.8 84.1 - 66.0 %    MCV 87.8 77.6 - 95.7 fL    MCH 29.3 25.9 - 32.4 pg    MCHC 33.4 32.0 - 36.0 g/dL    RDW 63.0 (H) 16.0 - 15.2 %    MPV 8.1 6.8 - 10.7 fL    Platelet 324 150 - 450 10*9/L    nRBC 0 <=4 /100 WBCs    Neutrophils % 68.0 %    Lymphocytes % 24.5 %    Monocytes % 5.9 %    Eosinophils % 0.8 %    Basophils % 0.8 %    Absolute Neutrophils 5.7 1.8 - 7.8 10*9/L    Absolute Lymphocytes 2.1 1.1 - 3.6 10*9/L    Absolute Monocytes 0.5 0.3 - 0.8 10*9/L    Absolute Eosinophils 0.1 0.0 - 0.5 10*9/L    Absolute Basophils 0.1 0.0 - 0.1 10*9/L     *Note: Due to a large number of results and/or encounters for the requested time period, some results have not been displayed. A complete set of results can be found in Results Review.        Creatinine   Date Value Ref Range Status   03/18/2022 0.73 0.60 - 1.10 mg/dL Final   10/93/2355 7.32 0.60 - 1.10 mg/dL Final   20/25/4270 6.23 0.60 - 1.10 mg/dL Final   76/28/3151 7.61 0.60 - 1.10 mg/dL Final   60/73/7106 2.69 0.76 - 1.27 mg/dL Final   48/54/6270 3.50 0.70 - 1.30 mg/dL Final   09/38/1829 9.37 0.70 - 1.30 mg/dL Final   16/96/7893 8.10 0.70 - 1.30 mg/dL Final       Triglycerides   Date Value Ref Range Status   06/18/2021 163 (H) 0 - 150 mg/dL Final   17/51/0258 527 1 - 149 mg/dL Final     HDL   Date Value Ref Range Status 06/18/2021 42 40 - 60 mg/dL Final   78/24/2353 79 (H) 40 - 59 mg/dL Final     LDL Calculated   Date Value Ref Range Status   06/18/2021 115 (H) 40 - 99 mg/dL Final     Comment:     NHLBI Recommended Ranges, LDL Cholesterol, for Adults (20+yrs) (ATPIII), mg/dL  Optimal              <614  Near Optimal        100-129  Borderline High     130-159  High  160-189  Very High            >=190  NHLBI Recommended Ranges, LDL Cholesterol, for Children (2-19 yrs), mg/dL  Desirable            <161  Borderline High     110-129  High                 >=130       LDL Cholesterol, Calculated   Date Value Ref Range Status   02/15/2013 100 mg/dL Final     Comment:     :  ADULTS (20 years or older)  Optimal         <100  Near Optimal    100-129  Borderline High 130-159  High            160-189  Very High       >=190  CHILDREN (2-19 years)  Desirable       <110  Borderline High 110-129  High            >/= 130       IMAGING STUDIES:  None

## 2022-03-19 LAB — HIV RNA, QUANTITATIVE, PCR
HIV RNA LOG10: 1.45 {Log_copies}/mL — ABNORMAL HIGH (ref <0.00)
HIV RNA QNT RSLT: DETECTED — AB
HIV RNA: 28 {copies}/mL — ABNORMAL HIGH (ref <0)

## 2022-03-20 NOTE — Unmapped (Signed)
[facility] Refill Coordination Note    Specialty Medication(s) to be Shipped:   Inflammatory Disorders: Dupixent    Other medication(s) to be shipped: No additional medications requested for fill at this time     Timothy Tapia, DOB: August 02, 1965  Phone: [phone] (home) [phone] (work)      All above HIPAA information was verified with patient.     Was a Nurse, learning disability used for this call? No    Completed refill call assessment today to schedule patient's medication shipment from the [facility] [phone]).  All relevant notes have been reviewed.     Specialty medication(s) and dose(s) confirmed: Regimen is correct and unchanged.   Changes to medications: Ariyan reports no changes at this time.  Changes to insurance: No  New side effects reported not previously addressed with a pharmacist or physician: None reported  Questions for the pharmacist: No    Confirmed patient received a Conservation officer, historic buildings and a Surveyor, mining with first shipment. The patient will receive a drug information handout for each medication shipped and additional FDA Medication Guides as required.       DISEASE/MEDICATION-SPECIFIC INFORMATION        For patients on injectable medications: Patient currently has 1 doses left.  Next injection is scheduled for 9/21.    SPECIALTY MEDICATION ADHERENCE     Medication Adherence    Patient reported X missed doses in the last month: 0  Specialty Medication: Dupixent  Patient is on additional specialty medications: No  Patient is on more than two specialty medications: No  Any gaps in refill history greater than 2 weeks in the last 3 months: no  Demonstrates understanding of importance of adherence: yes  Informant: patient  Reliability of informant: reliable              Confirmed plan for next specialty medication refill: delivery by pharmacy  Refills needed for supportive medications: not needed              Were doses missed due to medication being on hold? No    Dupixent 300mg /26ml: Patient has 7 days of medication on hand    REFERRAL TO PHARMACIST     Referral to the pharmacist: Not needed      [facility]     Shipping address confirmed in Epic.     Delivery Scheduled: Yes, Expected medication delivery date: 9/26.     Medication will be delivered via Same Day Courier to the prescription address in Epic WAM.    Olga Millers   [facility] Specialty Technician

## 2022-03-26 MED FILL — DUPIXENT 300 MG/2 ML SUBCUTANEOUS PEN INJECTOR: SUBCUTANEOUS | 28 days supply | Qty: 8 | Fill #4

## 2022-03-26 NOTE — Unmapped (Addendum)
Left brief message with call back number.     ----- Message from Franki Cabot sent at 03/26/2022  9:20 AM EDT -----  Incoming Call  Caller: [name]  Best callback number: [phone]  Reason for call: Question about medication that he said Dr. Servando Snare was going to give him for his diabetes

## 2022-04-09 DIAGNOSIS — Z006 Encounter for examination for normal comparison and control in clinical research program: Principal | ICD-10-CM

## 2022-04-10 DIAGNOSIS — B20 Human immunodeficiency virus [HIV] disease: Principal | ICD-10-CM

## 2022-04-10 MED ORDER — MIRTAZAPINE 30 MG TABLET
ORAL_TABLET | 1 refills | 0 days | Status: CP
Start: 2022-04-10 — End: ?

## 2022-04-10 MED ORDER — PREZCOBIX 800 MG-150 MG TABLET
ORAL_TABLET | 1 refills | 0 days | Status: CP
Start: 2022-04-10 — End: ?

## 2022-04-10 MED ORDER — TIVICAY 50 MG TABLET
ORAL_TABLET | 1 refills | 0 days | Status: CP
Start: 2022-04-10 — End: ?

## 2022-04-10 NOTE — Unmapped (Signed)
Medication Requested: Dwana Melena      Future Appointments   Date Time Provider Department Center   04/11/2022  9:00 AM Copperthite, Keely Alexa C [facility]C [facility]   04/11/2022  1:20 PM Copperthite, Keely Alexa C [facility]C [facility]   04/22/2022  4:15 PM Mayford Knife Audree Camel, PA DERMMRKT [facility]   05/20/2022  2:00 PM Minerva Fester, MD UNCINFDISET [facility]   05/30/2022  8:30 AM Otis Dials, FNP UNCUROSVCET [facility]     Per Provider Note: On Prezcobix + Dolutegravir , On Remeron 30 mg at bedtime     Standing order protocol requirements met?: Yes    Sent to: Pharmacy per protocol    Days Supply Given: 30 days  Number of Refills: 1

## 2022-04-11 ENCOUNTER — Institutional Professional Consult (permissible substitution): Admit: 2022-04-11 | Discharge: 2022-04-12

## 2022-04-11 DIAGNOSIS — Z006 Encounter for examination for normal comparison and control in clinical research program: Principal | ICD-10-CM

## 2022-04-11 NOTE — Unmapped (Signed)
Patient arrived for study protocol [id number], alert and ambulatory.   Pt met with study coordinator, Clarita Crane. Weight, height and vital signs obtained. Vitals stable - see flowsheet.   Kit labs drawn per order.   Nourishment provided to patient.  One hour observation window today.  Vitals stable - see flowsheet.  Pain level 0 out of 10 throughout the visit.   Patient met with study coordinator, Clarita Crane.  Patient discharged without complaint, voice or noted.

## 2022-04-11 NOTE — Unmapped (Signed)
Timothy Tapia presents for their lumbar puncture (LP) study visit.    During the consent process I explained in detail the LP procedure and risks.      The following risks, and ways to minimize them, were reviewed and discussed:    Backache, shooting pain down the leg(s) during the procedure, bleeding, headache, persist headache lasting > 5 days not responding to NSAIDs, infection, CSF leak, and brain herniation.    The subject had the opportunity to ask questions and have them answered to their satisfaction prior to the LP.  Procedure consent signed by participant today.     Participant was placed in a seated position on exam bed with feet propped upon a step stool.  He was prepped and draped in the usual sterile fashion. L3-L4 space located using bilateral iliac crests as landmarks. Approximately 1% Lidocaine without epinephrine was used to anesthetize the area. A 20G spinal needle was introduced into the arachnoid space and when the stylet was removed, successful flow of CSF was obtained. Approximately . of CSF was collected into 4 separate sterile tubes. The stylet was inserted, and the spinal needle was removed after adequate fluid obtained. Blood loss was minimal. Tubes 1 & 2 were sent to [facility] for processing, Tubes 3 & 4 were taken to [facility] by the study coordinator.  Betadine washed off participant???s back and bandage placed over insertion site.  Subject tolerated the procedure well with no complications observed and no complaints. Spontaneous movement of bilateral extremities was observed after the procedure. Participant remained in lying position for a 60-minute observation period.  Written and verbal post-LP instructions were provided which included study coordinator and emergency contact numbers. Study coordinator will follow-up with a phone call in 1-2 days to assess for adverse events.

## 2022-04-18 NOTE — Unmapped (Signed)
[facility] Refill Coordination Note    Specialty Medication(s) to be Shipped:   Inflammatory Disorders: Dupixent    Other medication(s) to be shipped: No additional medications requested for fill at this time     Timothy Tapia, DOB: 18-Mar-1966  Phone: [phone] (home) [phone] (work)      All above HIPAA information was verified with patient.     Was a Nurse, learning disability used for this call? No    Completed refill call assessment today to schedule patient's medication shipment from the [facility] [phone]).  All relevant notes have been reviewed.     Specialty medication(s) and dose(s) confirmed: Regimen is correct and unchanged.   Changes to medications: [name] reports no changes at this time.  Changes to insurance: No  New side effects reported not previously addressed with a pharmacist or physician: None reported  Questions for the pharmacist: No    Confirmed patient received a Conservation officer, historic buildings and a Surveyor, mining with first shipment. The patient will receive a drug information handout for each medication shipped and additional FDA Medication Guides as required.       DISEASE/MEDICATION-SPECIFIC INFORMATION        For patients on injectable medications: Patient currently has 1 doses left.  Next injection is scheduled for 04/24/22.    SPECIALTY MEDICATION ADHERENCE     Medication Adherence    Patient reported X missed doses in the last month: 0  Specialty Medication: Dupixent  Patient is on additional specialty medications: No                                Were doses missed due to medication being on hold? No    Dupixent 300mg /63ml: Patient has 7 days of medication on hand    REFERRAL TO PHARMACIST     Referral to the pharmacist: Not needed      [facility]     Shipping address confirmed in Epic.     Delivery Scheduled: Yes, Expected medication delivery date: 04/24/22.     Medication will be delivered via Same Day Courier to the prescription address in Epic WAM.    [name]   [facility] Specialty Technician

## 2022-04-22 ENCOUNTER — Ambulatory Visit
Admit: 2022-04-22 | Discharge: 2022-04-23 | Payer: MEDICARE | Attending: Physician Assistant | Primary: Physician Assistant

## 2022-04-22 DIAGNOSIS — L309 Dermatitis, unspecified: Principal | ICD-10-CM

## 2022-04-22 MED ORDER — DUPILUMAB 300 MG/2 ML SUBCUTANEOUS PEN INJECTOR
SUBCUTANEOUS | 5 refills | 0.00000 days | Status: CP
Start: 2022-04-22 — End: ?
  Filled 2022-06-21: qty 8, 28d supply, fill #0

## 2022-04-22 MED ORDER — BETAMETHASONE, AUGMENTED 0.05 % TOPICAL OINTMENT
5 refills | 0.00000 days | Status: CP
Start: 2022-04-22 — End: ?

## 2022-04-22 MED ORDER — BETAMETHASONE DIPROPIONATE 0.05 % TOPICAL OINTMENT
Freq: Two times a day (BID) | TOPICAL | 0 refills | 0.00000 days | Status: CN
Start: 2022-04-22 — End: 2023-04-22

## 2022-04-22 NOTE — Unmapped (Signed)
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 Biopsy 03/2020 with Epidermal acanthosis with focal spongiosis and prominent papillary dermal fibrosis.  - Began Dupixent on 05/2020 and has responded well; previously discussed opt of JAK inhibitor -- some literature suggests JAK inhibitor use in HIV patients to reduce inflammation; pt has hx of DVT so not a candidate for JAK inhibitor though  - Continue dupilumab 300 mg/2 mL PnIj; Inject 300 mg under the skin once a week. Refills sent today.   - Continue betamethasone 0.05% ointment BID to active areas until smooth. Reviewed side effects of topical corticosteroids including atrophy, striae and dyschromia; however, discussed the current light areas are PIH and secondary to eczema and not the medications. Refills sent today.   - Encouraged to apply Eucerin daily to moisturize    The patient was advised to call for an appointment should any new, changing, or symptomatic lesions develop.     RTC: Return in about 6 months (around 10/22/2022) for eczema f/u. or sooner as needed   _________________________________________________________________      Chief Complaint     Chief Complaint   Patient presents with    Eczema     Most recent flare up was about month ago but there has been improvement        HPI     Timothy Tapia is a 56 y.o. male who presents as a returning patient (last seen by Lottie Dawson, PA on 10/11/2021) to [facility] for follow up of atopic-like dermatitis. At last visit, patient was to continue Dupixent 300mg  weekly and betamethasone 0.05% ointment for atopic-like dermatitis. He was also to start clotrimazole 10mg  5 times daily and Magic Mouthwash (nystatin/diphenhydramine/mylanta) Oral Mixture 10mL every 6 hours as needed.     Atopic-like dermatitis  - reports his flares comes and goes on his bilateral legs, hands and elbows. His most recent flare occurred a month ago with the plaques still present on his body.   - endorse pruritus   - he notes improvement to his symptoms since being on the Dupixent. He used to inject the medication 4 days in a row.   - patient is unsure of the topical he is using. He notes the tube is white and yellow.   - denies treating with betamethasone.     The patient denies any other new or changing lesions or areas of concern.     Pertinent Past Medical History     No history of skin cancer    HIV(+)    Family History:   Negative for melanoma    Past Medical History, Family History, Social History, Medication List, Allergies, and Problem List were reviewed in the rooming section of Epic.     ROS: Other than symptoms mentioned in the HPI, no fevers, chills, or other skin complaints    Physical Examination     GENERAL: Well-appearing male in no acute distress, resting comfortably.  NEURO: Alert and oriented, answers questions appropriately  PSYCH: Normal mood and affect  SKIN: Examination of the chest, back, bilateral upper extremities and bilateral lower extremities was performed  - hyperpigmented papules and plaques of the hands, forearms and lower legs with lichenification     All  areas not commented on are within normal limits or unremarkable    Scribe's Attestation: Sharene Butters, PA-C obtained and performed the history, physical exam and medical decision making elements that were entered into the chart.  Signed by Reece Leader, Scribe, on April 22, 2022 at 3:53 PM    ----------------------------------------------------------------------------------------------------------------------  April 22, 2022 4:20 PM. Documentation assistance provided by the Scribe. I was present during the time the encounter was recorded. The information recorded by the Scribe was done at my direction and has been reviewed and validated by me.  ------------------------------------------------------------------------------------------------------------------      (Approved Template 03/13/2020)

## 2022-04-22 NOTE — Unmapped (Addendum)
For your eczema   - Apply the betamethasone ointment twice daily to the active areas until smooth. Repeat as needed.   - Continue Dupixent 300mg . Inject ONCE WEEKLY    We sent refills of your medications today.

## 2022-04-24 MED FILL — DUPIXENT 300 MG/2 ML SUBCUTANEOUS PEN INJECTOR: SUBCUTANEOUS | 28 days supply | Qty: 8 | Fill #5

## 2022-05-06 NOTE — Unmapped (Signed)
Called to complete a RW renewal app. Patient answered a few questions on the assessment and stated they would rather come in to finish the assessment and bring income. Told patient to let the front desk know when they arrive.    Rhetta Mura  ID Clinic Benefits Counselor  Time of Intervention-6 mins

## 2022-05-10 NOTE — Unmapped (Signed)
04/11/2022 study lab results entered into lab flowsheet; I[facility] [id number], Visit 2 -- progress note

## 2022-05-16 ENCOUNTER — Ambulatory Visit: Admit: 2022-05-16 | Discharge: 2022-05-17 | Payer: MEDICARE | Attending: Family | Primary: Family

## 2022-05-16 DIAGNOSIS — R42 Dizziness and giddiness: Principal | ICD-10-CM

## 2022-05-16 NOTE — Unmapped (Addendum)
Timothy Tapia called stating he has been very dizzy - last night and this morning. Has a history of dizziness. Has taken some Anitvert 25 mg this morning but it is not helping. Wants to be seen today, appt made for   3 pm.       ----- Message from Franki Cabot sent at 05/16/2022  9:39 AM EST -----  Incoming Call  Caller: [name]  Best callback number: [phone]  Reason for call: Needs to be seen immediatley wants to come to walk in        Thank you!

## 2022-05-16 NOTE — Unmapped (Signed)
INFECT[facility] DISEASES CLINIC  [address]  [place], [place]  [address]  P [phone]  F [phone]     Primary care provider: Minerva Fester, MD    Assessment/Plan:      HIV (dx'd ***, nadir CD4 *** / ***% in ***)  - {ID Clinic - MDM 2021 (Optional):73327}  Overall ***. Current regimen: {ID Clinic ARV Selection ZHYQ:65784}  Misses doses of ARVs {ID - Frequency:68239}    Med access {ID Clinic Rx Access:60826}  CD4 count {ID Clinic - CD4 Count & Prophylaxis:77705}  Discussed {ID Clinic Discussion Topics:60825}    Lab Results   Component Value Date    ACD4 616 08/13/2021    CD4 44 08/13/2021    HIVRS Detected (A) 03/18/2022    HIVCP <40 04/11/2022       {ID Clinic - ONGE:95284}  {ID Clinic - ARV XLKG:40102}  {ID Clinic - Adherence:61194} ARV adherence      ***  - {ID Clinic - MDM 2021 (Optional):73327}  ***  ***    Sexual health & secondary prevention    Lab Results   Component Value Date    RPR Nonreactive 02/16/2018    CTNAA Negative 08/29/2016    CTNAA Negative 04/17/2016    CTNAA Negative 04/17/2016    GCNAA Negative 08/29/2016    GCNAA Negative 04/17/2016    GCNAA Negative 04/17/2016    SPECTYPE Swab 08/29/2016    SPECTYPE Urine 04/17/2016    SPECTYPE Swab 04/17/2016    SPECSOURCE Throat-[place] Only 08/29/2016    SPECSOURCE Urine 04/17/2016    SPECSOURCE Throat-[place] Only 04/17/2016     GC/CT NAATs -- {Blank single:19197::obtained today,not being checked routinely for this patient,needed but deferred to future visit}  RPR -- {Blank single:19197::NR *** - repeat 1Y,for screening obtained today,for treatment follow-up obtained today,not being checked routinely for this patient,needed but deferred to future visit}    Immunization History   Administered Date(s) Administered    COVID-19 VACC,MRNA,(PFIZER)(PF) 11/18/2019, 11/18/2019, 12/09/2019, 12/09/2019, 06/12/2020    INFLUENZA TIV (TRI) PF (IM) 04/27/2007, 05/10/2008, 04/03/2009, 04/02/2010, 04/19/2011, 03/30/2012    Influenza Vaccine routine follow up with Dr. Servando Snare    Sexual health & secondary prevention    Lab Results   Component Value Date    RPR Nonreactive 02/16/2018    CTNAA Negative 08/29/2016    CTNAA Negative 04/17/2016    CTNAA Negative 04/17/2016    GCNAA Negative 08/29/2016    GCNAA Negative 04/17/2016    GCNAA Negative 04/17/2016    SPECTYPE Swab 08/29/2016    SPECTYPE Urine 04/17/2016    SPECTYPE Swab 04/17/2016    SPECSOURCE Throat-[place] Only 08/29/2016    SPECSOURCE Urine 04/17/2016    SPECSOURCE Throat-[place] Only 04/17/2016     GC/CT NAATs -- needed but deferred to future visit  RPR -- needed but deferred to future visit    Immunization History   Administered Date(s) Administered    COVID-19 VACC,MRNA,(PFIZER)(PF) 11/18/2019, 11/18/2019, 12/09/2019, 12/09/2019, 06/12/2020    INFLUENZA TIV (TRI) PF (IM) 04/27/2007, 05/10/2008, 04/03/2009, 04/02/2010, 04/19/2011, 03/30/2012    Influenza Vaccine Quad (IIV4 PF) 31mo+ injectable 04/02/2013, 05/03/2014, 03/20/2015    PNEUMOCOCCAL POLYSACCHARIDE 23-VALENT 08/16/2002, 08/16/2002, 11/30/2007    PPD Test 04/27/2007, 04/26/2008, 04/03/2009, 07/20/2010    Pneumococcal Conjugate 13-Valent 07/27/2012    SHINGRIX-ZOSTER VACCINE (HZV), RECOMBINANT,SUB-UNIT,ADJUVANTED IM 10/07/2018    TD(TDVAX),ADSORBED,2LF(IM)(PF) 12/25/2015    TdaP 03/17/2006, 03/17/2006     Immunizations ordered today: none    I personally spent 50 minutes face-to-face and non-face-to-face  in the care of this patient, which includes all pre, intra, and post visit time on the date of service.  All documented time was specific to the E/M visit and does not include any procedures that may have been performed.      Disposition  Return to clinic  05/20/22  or sooner if needed.    Varney Daily, FNP-BC  [facility] at [facility]  [address], [place], [place] [address]    Phone: [phone]   Fax: [phone]          Subjective:      Chief Complaint   HIV followup    HPI  Urgent visit for Timothy Tapia, a 57 release capsule TAKE 1 CAPSULE BY MOUTH THREE TIMES DAILY WITH A MEAL    darunavir-cobicistat (PREZCOBIX) 800-150 mg-mg tablet TAKE ONE TABLET BY MOUTH DAILY    dolutegravir (TIVICAY) 50 mg TABLET TAKE ONE TABLET BY MOUTH DAILY    dupilumab (DUPIXENT PEN) 300 mg/2 mL PnIj Inject the contents of 1 pen (300 mg total) under the skin once a week.    dupilumab 300 mg/2 mL PnIj Inject the contents of 1 pen (300 mg total) under the skin every seven (7) days.    empty container Misc Use as directed to dispose of Dupixent pens.    enalapril (VASOTEC) 20 MG tablet TAKE ONE TABLET BY MOUTH EVERY DAY    gabapentin (NEURONTIN) 300 MG capsule Take 3 capsules (900 mg total) by mouth two (2) times a day.    halobetasol (ULTRAVATE) 0.05 % ointment APPLY TO AFFECTED AREA TWICE A DAY AS NEEDED AVOID FACE AND FOLDS    hydrOXYzine (ATARAX) 25 MG tablet Take 1 tablet (25 mg total) by mouth at bedtime. Prn pruritus    mirtazapine (REMERON) 30 MG tablet TAKE ONE TABLET BY MOUTH EVERY DAY    pantoprazole (PROTONIX) 20 MG tablet TAKE ONE TABLET BY MOUTH EVERY DAY    triamcinolone (KENALOG) 0.1 % ointment Apply to itchy spots 2 times daily. For elbows at night time, apply medicine and then cover with saran wrap.    VENTOLIN HFA 90 mcg/actuation inhaler INHALE 2 PUFFS UP TO FOUR TIMES DAILY AS NEEDED     Current Facility-Administered Medications on File Prior to Visit   Medication    triamcinolone acetonide (KENALOG-40) injection 40 mg       Allergies   Allergen Reactions    Sulfa (Sulfonamide Antibiotics) Hives    Sulfur Hives       Social History  Social History     Tobacco Use    Smoking status: Every Day     Packs/day: 0.25     Years: 27.00     Additional pack years: 0.00     Total pack years: 6.75     Types: Cigarettes     Start date: 06/28/1993     Passive exposure: Current    Smokeless tobacco: Never    Tobacco comments:     1 pack lasts 4 days; referred to [facility] Quitline; Taking nicorette gum   Substance Use Topics    Alcohol use: Not Currently     Alcohol/week: 3.0 standard drinks of alcohol     Types: 3 Cans of beer per week     Comment: Na       Review of Systems  As per HPI. Remainder of 10 systems reviewed, negative.        Objective:      Temp 37.2 ??C (99 ??  F) (Oral)  - Ht 162.6 cm (5' 4.02)  - Wt 70.3 kg (155 lb)  - BMI 26.59 kg/m??     Const {CBH Blank Multiple:32997::mildly ill-appearing,looks well,attentive, alert, appropriate}   Eyes sclerae anicteric, noninjected OU   ENT {INF PE ZOX:0960454098}   Lymph {INF PE LYMPH:[phone]}   CV RRR. {Blank single:19197::SEM @2RICS .,SEM @ LLSB.,No murmurs.} No rub or gallop. S1/S2.   Resp {INF PE [phone]}   GI Soft. NTND. NABS.   GU {INF PE JX:9147829562}   Rectal {INF PE RECTAL:[phone]}   Skin {INF PE ZHYQ:6578469629}   MSK {INF PE BMW:4132440102}   Neuro {INF PE NEURO:8472382900}   Psych {Blank single:19197::Flat,Appropriate} affect. Eye contact good. Linear thoughts. Fluent speech.     Laboratory Data  Reviewed in Epic today, using Synopsis and Chart Review filters.    Lab Results   Component Value Date    CREATININE 0.73 03/18/2022    QFTTBGOLD Negative 12/25/2015    QTBG NEGATIVE 09/23/2011    HEPCAB Nonreactive 03/23/2019    CHOL 190 06/18/2021    HDL 42 06/18/2021    LDL 115 (H) 06/18/2021    NONHDL 148 (H) 06/18/2021    TRIG 163 (H) 06/18/2021    A1C 6.0 (H) 03/18/2022    FINALDX  04/13/2020     Right lateral thigh, punch  -Epidermal acanthosis with focal spongiosis and prominent papillary dermal fibrosis. (See comment.) Epic today, using Synopsis and Chart Review filters.    Lab Results   Component Value Date    CREATININE 0.73 03/18/2022    QFTTBGOLD Negative 12/25/2015    QTBG NEGATIVE 09/23/2011    HEPCAB Nonreactive 03/23/2019    CHOL 190 06/18/2021    HDL 42 06/18/2021    LDL 115 (H) 06/18/2021    NONHDL 148 (H) 06/18/2021    TRIG 163 (H) 06/18/2021    A1C 6.0 (H) 03/18/2022    FINALDX  04/13/2020     Right lateral thigh, punch  -Epidermal acanthosis with focal spongiosis and prominent papillary dermal fibrosis. (See comment.)

## 2022-05-17 NOTE — Unmapped (Signed)
Referral Services Note     Duration of Intervention: 15 minutes    TYPE OF CONTACT: Epic Chat with Provider(s): Clinical Nursing Staff, Admin    ASSESSMENT: Nursing staff stated that patient needed assistance with transportation.     INTERVENTION:  SW informed admin staff to assist patient with transportation to return home.     Admin staff booked patient a [employer] to return home.    PLAN: Patient received transportation home. SW will follow up with patient at a later date regarding transportation services.     Kortny Lirette, MSW  [place] [facility]

## 2022-05-17 NOTE — Unmapped (Signed)
Name: Edgardo Turlington  Date: 05/16/2022  Address: [address]  [place] [address]   [place] of Residence:  [place]  Phone: [phone]     Started assessment with patient options: in clinic     Is this the same address for mailing? Yes  If No, Mailing Address is:     Housing Financial controller and Medicare Part C    Tax Filing Status  I did not file taxes     Employment Status  Employed Part Time     Income  Salary/Wages and Social Security (Retirement/Survivor's/Disability)    If no or low income, how are you meeting your basic needs?  Food Stamps/EBT    List Tax Household Members including relationship to you:   N/A    Someone in my household receives: Not Applicable (for household members)  Specify who: N/A    Medication Access/Barriers: None.    Do you have a current diagnosis for Hepatitis C?  Lab Results   Component Value Date    HEPCAB Nonreactive 03/23/2019       Have you used tobacco products four or more times per week in the last six months?  Yes    Teacher, adult education  Patient has affordable insurance through [employer], [place], and or Employment and is not eligible.    Patient given ACA education if they qualified based on answers to questions above.     MyChart  Do you have an active MyChart account? Yes     If MyChart is not set up, informed patient on how to set up MyChart N/A    Patient was informed of the following programs;   N/A    The following applications/handouts were given to patient:   N/A    The following forms were also started with the patient:   N/A    Juanell Fairly application status: Incomplete; patient needs to send proof of income (2 paystubs) and SSI Letter    Patient is applying for [employer] on Charges Only     Additional Comments:       [name] Asper,  Benefits & Eligibility Coordinator  Time of Intervention: 5 minutes

## 2022-05-20 ENCOUNTER — Ambulatory Visit
Admit: 2022-05-20 | Discharge: 2022-05-20 | Payer: MEDICARE | Attending: Infectious Disease | Primary: Infectious Disease

## 2022-05-20 ENCOUNTER — Ambulatory Visit: Admit: 2022-05-20 | Discharge: 2022-05-20 | Payer: MEDICARE

## 2022-05-20 DIAGNOSIS — B20 Human immunodeficiency virus [HIV] disease: Principal | ICD-10-CM

## 2022-05-20 DIAGNOSIS — E74819 Disorders of glucose transport, unspecified (CMS-HCC): Principal | ICD-10-CM

## 2022-05-20 LAB — HEMOGLOBIN A1C
ESTIMATED AVERAGE GLUCOSE: 120 mg/dL
HEMOGLOBIN A1C: 5.8 % — ABNORMAL HIGH (ref 4.8–5.6)

## 2022-05-20 LAB — HIV RNA, QUANTITATIVE, PCR
HIV RNA QNT RSLT: DETECTED — AB
HIV RNA: <20 {copies}/mL — ABNORMAL HIGH (ref <0)

## 2022-05-20 MED ORDER — BECONASE AQ 42 MCG (0.042 %) NASAL SPRAY
Freq: Two times a day (BID) | NASAL | 3 refills | 0.00000 days | Status: CP
Start: 2022-05-20 — End: 2022-05-20
  Filled 2022-05-28: qty 25, 30d supply, fill #0

## 2022-05-20 NOTE — Unmapped (Signed)
Pt left clinic appt before speaking with Benefits.    Madinah Cathcart-Rowe  Benefits Counselor  Time of Intervention: 2 mins

## 2022-05-20 NOTE — Unmapped (Signed)
PCP:  Minerva Fester, MD    05/20/2022    This is a RETURN visit for this 56 y.o. male with the following diagnoses:    Patient Active Problem List    Diagnosis Date Noted    Human immunodeficiency virus (HIV) disease (CMS-HCC) 02/04/1995    Chronic bilateral low back pain 11/09/2019    COPD (chronic obstructive pulmonary disease) (CMS-HCC) 05/27/2017    Hypokalemia 05/27/2017    GERD (gastroesophageal reflux disease) 05/27/2017    Pancreatic pseudocyst 07/06/2015    GI bleeding 07/06/2015    ETOH abuse 07/06/2015    DVT (deep venous thrombosis), IMV thrombus 10/07/13 provoked per hematology (72mo therapy) 10/25/2013    Colitis 10/12/2013    Pancreatitis 02/15/2013    Tobacco use disorder 02/15/2013    HTN (hypertension) 02/15/2013    Barrett esophagus 02/15/2013    Asthma 02/15/2013    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  Swears he never misses a dose.   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  Is seen [facility] Intraarticular corticosteroids had helped but no longer.  Now with neck and right shoulder pain.   Gabapentin 900 mg at bedtime is helping he says. Will try to escalate to 900 mg BID slowly. Watch for daytime somnolence.   Check MRI cervical spine  May need Neurosurgery consult  Spine Clinic involved    Hx Pancreatitis - Now quiescent   Old MRI shown that prior pancreatic pseudocyst resolved  On Creon     Barrett's Espohagitis  On PPI  Has some dysphagia  Will ask GI to see and consider repeat EGD given Barretts and symptoms     Smoking   Smoking still  Says trying to stop     Mental Health  See above  On Remeron 30 mg at bedtime      Health Maintenance   Immunizations:  HBV immune. HCV antibody positive but HCV viral load negative so not infected.   C19 fully vaccinated and boosted w bivalent.   Refuses flu shot.   Cancer Screening:   Colonoscopy in 2017. Multiple polyps. Repeat.   Barrett's so EGDs  HTN: Controlled today. Will watch. On ACEi plus Ca++ bloocker.   Derm: Dermatology seeing for Atopic like dermatitis. On Dupixent.   Sinus congestion: Beconase nasal spray - less interaction with Pezcobix.  Will follow up in 10-12 months.       Chief Complaint: Routine HIV follow-up    HPI:  Mr. Timothy Tapia returns for a routine appointment. See detailed A/P.     HIV-wise: Mostly stable CD4 and HIV RNA. Reports good adherence to ART.      ROS:   No fever chills, sweats, nausea, vomiting, diarrhea, rash, headache, joint pain. Neg for epigastric pain. Pos for LBP, nasal stuffiness    All other systems are negative.    ALLERGIES:  Sulfa (sulfonamide antibiotics) and Sulfur    MEDICATIONS:  Current Outpatient Medications   Medication Sig Dispense Refill    amLODIPine (NORVASC) 5 MG tablet TAKE ONE TABLET BY MOUTH DAILY 30 tablet 11    betamethasone, augmented, (DIPROLENE) 0.05 % ointment Apply twice daily to active areas until smooth. 100 g 5    cetirizine (ZYRTEC) 10 MG tablet Take 1 tablet (10 mg total) by mouth daily. 30 tablet 11    CREON 24,000-76,000 -120,000 unit CpDR delayed release capsule TAKE 1 CAPSULE BY MOUTH THREE TIMES DAILY WITH A MEAL 90 capsule 5    darunavir-cobicistat (PREZCOBIX) 800-150 mg-mg tablet TAKE ONE TABLET BY MOUTH DAILY 30 tablet 1    dolutegravir (TIVICAY) 50 mg TABLET TAKE ONE TABLET BY MOUTH DAILY 30 tablet 1    dupilumab (DUPIXENT PEN) 300 mg/2 mL PnIj Inject the contents of 1 pen (300 mg total) under the skin once a week. 8 mL 6    dupilumab 300 mg/2 mL PnIj Inject the contents of 1 pen (300 mg total) under the skin every seven (7) days. 8 mL 5    empty container Misc Use as directed to dispose of Dupixent pens. 1 each 2    enalapril (VASOTEC) 20 MG tablet TAKE ONE TABLET BY MOUTH EVERY DAY 90 tablet 2    gabapentin (NEURONTIN) 300 MG capsule Take 3 capsules (900 mg total) by mouth two (2) times a day. 270 capsule 6    halobetasol (ULTRAVATE) 0.05 % ointment APPLY TO AFFECTED AREA TWICE A DAY AS NEEDED AVOID FACE AND FOLDS 30 g 1    hydrOXYzine (ATARAX) 25 MG tablet Take 1 tablet (25 mg total) by mouth at bedtime. Prn pruritus 30 tablet 0    meclizine (ANTIVERT) 25 mg tablet Take 1 tablet (25 mg total) by mouth Three (3) times a day as needed for dizziness.      mirtazapine (REMERON) 30 MG tablet TAKE ONE TABLET BY MOUTH EVERY DAY 30 tablet 1    pantoprazole (PROTONIX) 20 MG tablet TAKE ONE TABLET BY MOUTH EVERY DAY 90 tablet 3    triamcinolone (KENALOG) 0.1 % ointment Apply to itchy spots 2 times daily. For elbows at night time, apply medicine and then cover with saran wrap. 454 g 0    VENTOLIN HFA 90 mcg/actuation inhaler INHALE 2 PUFFS UP TO FOUR TIMES DAILY AS NEEDED 18 g 11    beclomethasone (BECONASE AQ) 42 mcg (0.042 %) nasal spray 2 sprays into each nostril two (2) times a day. Dose is for each nostril. 25 g 3     Current Facility-Administered Medications   Medication Dose Route Frequency Provider Last Rate Last Admin    triamcinolone acetonide (KENALOG-40) injection 40 mg  40 mg Intra-articular Once Evaristo Bury, PA         PE:  A&A. NAD. Chest CTA, Heart RRR. EXT w/o edema    DATA:    Results for orders placed or performed  in visit on 05/16/22   RAPID INFLUENZA/RSV/COVID PCR    Specimen: Nasopharyngeal Swab   Result Value Ref Range    SARS-CoV-2 PCR Negative Negative    Influenza A Negative Negative    Influenza B Negative Negative    RSV Negative Negative     *Note: Due to a large number of results and/or encounters for the requested time period, some results have not been displayed. A complete set of results can be found in Results Review.        Creatinine   Date Value Ref Range Status   03/18/2022 0.73 0.60 - 1.10 mg/dL Final   09/81/1914 7.82 0.60 - 1.10 mg/dL Final   95/62/1308 6.57 0.60 - 1.10 mg/dL Final   84/69/6295 2.84 0.60 - 1.10 mg/dL Final   13/24/4010 2.72 0.76 - 1.27 mg/dL Final   53/66/4403 4.74 0.70 - 1.30 mg/dL Final   25/95/6387 5.64 0.70 - 1.30 mg/dL Final   33/29/5188 4.16 0.70 - 1.30 mg/dL Final       Triglycerides   Date Value Ref Range Status   06/18/2021 163 (H) 0 - 150 mg/dL Final   60/63/0160 109 1 - 149 mg/dL Final     HDL   Date Value Ref Range Status   06/18/2021 42 40 - 60 mg/dL Final   32/35/5732 79 (H) 40 - 59 mg/dL Final     LDL Calculated   Date Value Ref Range Status   06/18/2021 115 (H) 40 - 99 mg/dL Final     Comment:     NHLBI Recommended Ranges, LDL Cholesterol, for Adults (20+yrs) (ATPIII), mg/dL  Optimal              <202  Near Optimal        100-129  Borderline High     130-159  High                160-189  Very High            >=190  NHLBI Recommended Ranges, LDL Cholesterol, for Children (2-19 yrs), mg/dL  Desirable            <542  Borderline High     110-129  High                 >=130       LDL Cholesterol, Calculated   Date Value Ref Range Status   02/15/2013 100 mg/dL Final     Comment:     :  ADULTS (20 years or older)  Optimal         <100  Near Optimal    100-129  Borderline High 130-159  High            160-189  Very High >=190  CHILDREN (2-19 years)  Desirable       <110  Borderline High 110-129  High            >/= 130       IMAGING STUDIES:  None

## 2022-05-21 NOTE — Unmapped (Signed)
PROMIS Tablet Screening  Completed Date: 05/16/2022     SW reviewed self-administered screening.    Patient had a PHQ-9 score of 1  indicating None-Minimal depression.   Pt denies SI.   Pt scored 0 on AUDIT/AUDIT-C indicating Not-at-risk alcohol consumption  Pt  denies substance use in past 3 months.  Pt denies concerns for IPV.    Pt seen by provider for regular ID visit.  Pt was not seen in person by Social Work during this visit.            Timothy Tapia, [employer]

## 2022-05-24 DIAGNOSIS — K863 Pseudocyst of pancreas: Principal | ICD-10-CM

## 2022-05-24 DIAGNOSIS — B20 Human immunodeficiency virus [HIV] disease: Principal | ICD-10-CM

## 2022-05-24 MED ORDER — CREON 24,000-76,000-120,000 UNIT CAPSULE,DELAYED RELEASE
ORAL_CAPSULE | 5 refills | 0 days
Start: 2022-05-24 — End: ?

## 2022-05-24 MED ORDER — PREZCOBIX 800 MG-150 MG TABLET
ORAL_TABLET | 1 refills | 0 days
Start: 2022-05-24 — End: ?

## 2022-05-24 MED ORDER — MIRTAZAPINE 30 MG TABLET
ORAL_TABLET | 1 refills | 0 days
Start: 2022-05-24 — End: ?

## 2022-05-24 MED ORDER — TIVICAY 50 MG TABLET
ORAL_TABLET | 1 refills | 0 days
Start: 2022-05-24 — End: ?

## 2022-05-27 NOTE — Unmapped (Signed)
ERROR

## 2022-05-28 MED ORDER — PREZCOBIX 800 MG-150 MG TABLET
ORAL_TABLET | 9 refills | 0 days | Status: CP
Start: 2022-05-28 — End: ?

## 2022-05-28 MED ORDER — TIVICAY 50 MG TABLET
ORAL_TABLET | 9 refills | 0 days | Status: CP
Start: 2022-05-28 — End: ?

## 2022-05-28 MED ORDER — MIRTAZAPINE 30 MG TABLET
ORAL_TABLET | 9 refills | 0 days | Status: CP
Start: 2022-05-28 — End: ?

## 2022-05-28 MED ORDER — CREON 24,000-76,000-120,000 UNIT CAPSULE,DELAYED RELEASE
ORAL_CAPSULE | 9 refills | 0 days | Status: CP
Start: 2022-05-28 — End: ?

## 2022-05-28 NOTE — Unmapped (Signed)
Medication Requested: Creon, darunavir-cobicistat, dolutegravir, and mirtazapine.    Last visit: 05/20/2022    Future Appointments   Date Time Provider Department Center   05/30/2022  8:30 AM Otis Dials, FNP [facility]     Per Provider Note:   On Prezcobix + Dolutegravir   On Creon   On Remeron 30 mg at bedtime     Standing order protocol requirements met?: Yes    Sent to: Pharmacy per protocol    Days Supply Given: 30 days  Number of Refills: 9

## 2022-05-30 ENCOUNTER — Ambulatory Visit: Admit: 2022-05-30 | Discharge: 2022-05-31 | Payer: MEDICARE

## 2022-05-30 DIAGNOSIS — Z48816 Encounter for surgical aftercare following surgery on the genitourinary system: Principal | ICD-10-CM

## 2022-05-30 DIAGNOSIS — N471 Phimosis: Principal | ICD-10-CM

## 2022-05-30 DIAGNOSIS — Z789 Other specified health status: Principal | ICD-10-CM

## 2022-05-30 MED FILL — DUPIXENT 300 MG/2 ML SUBCUTANEOUS PEN INJECTOR: SUBCUTANEOUS | 28 days supply | Qty: 8 | Fill #6

## 2022-05-30 NOTE — Unmapped (Signed)
[facility] Refill Coordination Note    Specialty Medication(s) to be Shipped:   Inflammatory Disorders: Dupixent    Other medication(s) to be shipped: No additional medications requested for fill at this time     Timothy Tapia, DOB: 07/13/1965  Phone: [phone] (home) [phone] (work)      All above HIPAA information was verified with patient.     Was a Nurse, learning disability used for this call? No    Completed refill call assessment today to schedule patient's medication shipment from the [facility] [phone]).  All relevant notes have been reviewed.     Specialty medication(s) and dose(s) confirmed: Regimen is correct and unchanged.   Changes to medications: Shamari reports no changes at this time.  Changes to insurance: No  New side effects reported not previously addressed with a pharmacist or physician: None reported  Questions for the pharmacist: No    Confirmed patient received a Conservation officer, historic buildings and a Surveyor, mining with first shipment. The patient will receive a drug information handout for each medication shipped and additional FDA Medication Guides as required.       DISEASE/MEDICATION-SPECIFIC INFORMATION        For patients on injectable medications: Patient currently has 0 doses left.  Next injection is scheduled for 05/30/22.    SPECIALTY MEDICATION ADHERENCE     Medication Adherence    Patient reported X missed doses in the last month: 0  Specialty Medication: dupixent  Patient is on additional specialty medications: No  Informant: patient                          Were doses missed due to medication being on hold? No    Dupixent 300 mg/39ml: 0 days of medicine on hand       REFERRAL TO PHARMACIST     Referral to the pharmacist: Not needed      [facility]     Shipping address confirmed in Epic.     Delivery Scheduled: Yes, Expected medication delivery date: 05/30/22.     Medication will be delivered via Same Day Courier to the prescription address in Epic WAM.    Jasper Loser   [facility] Specialty Technician

## 2022-05-30 NOTE — Unmapped (Signed)
Assessment:  56 y.o. uncircumcised male with history of HIV, HTN, COPD, GERD, prior ETOH/drug abuse, prior DVT not on blood thinners.    Desires circumcision due to issues with phimosis/pain with erections.  Has had tears of prepuce in the past.    Today we discussed potential risks associated with circumcision, including but not limited to:  risk of bleeding, infection, need for re-operation, and/or poor cosmesis. There is also risk of penile discomfort and or numbness/decreased sensation.  Additionally, there are always risks associated with undergoing anytime of surgery that requires anesthesia such as (but not limited to) blood clot, heart attack, stroke, difficulty waking up from anesthesia, and even potential for death.     After discussing with patient he opts to proceed with surgery.    Plan:  Case request for circumcision        Urology Surgery Scheduling Checklist    Timing: Next available  Attending: Dr. Aundria Rud or any other available attending  Urology Pre-op:  No  Pre Anesthesia (Precare):  Yes - Scheduler place order when surgery confirmed  Urine:  No urine  Testing Plan:  No additional testing  Blood thinners:  None  Cardiac/Anticoagulation Clearance: Not indicated  Post Op Appointment(s): APP Benign in 3-4 weeks  Special requests: None        Referring Physician:   Minerva Fester, MD  [address]  [facility]  [id number]  [place],  [place] [address]    PCP:   Minerva Fester, MD    Chief Complaint   Patient presents with    New pt     Doing well, Desires circumcision       Subjective:    HPI:   56 y.o. male with history of HIV, HTN, COPD, GERD, prior ETOH/drug abuse, prior DVT not on blood thinners seen in consultation at the request of Minerva Fester, MD for evaluation. The patient has had concerns including New pt (Doing well, Desires circumcision).    Mentions that he is having pain and difficulty retracting foreskin when erect, and has made sex painful/uncomfortable.  Reports intermittent episodes in which his prepuce tears due to tightness and has caused bleeding.   Denies episodes of balanitis.  Denies prior episodes of paraphimosis.    Mentions he is not on any blood thinners.  Does not have a cardiologist denies CAD.  Denies any prior issues with anesthesia.         PMH:  Past Medical History:   Diagnosis Date    Acute pancreatitis     Barrett esophagus     Black stool 07/08/2017    Bronchitis     Colon polyp     Eczema     Gastroesophageal reflux disease     HIV disease (CMS-HCC)     Hypertension     Lack of access to transportation     Pancreatic pseudocyst     Thrombus     IMV thrombus r/t colitis & pancreatitis 10/07/13    Visual impairment        PSH:  Past Surgical History:   Procedure Laterality Date    esophogus surgery      PR COLONOSCOPY W/BIOPSY SINGLE/MULTIPLE  10/11/2013    Procedure: COLONOSCOPY, FLEXIBLE, PROXIMAL TO SPLENIC FLEXURE; WITH BIOPSY, SINGLE OR MULTIPLE;  Surgeon: Teodoro Spray, MD;  [facility];  Service: Gastroenterology    PR COLONOSCOPY W/BIOPSY SINGLE/MULTIPLE N/A 09/29/2014    Procedure: COLONOSCOPY, FLEXIBLE, PROXIMAL  TO SPLENIC FLEXURE; WITH BIOPSY, SINGLE OR MULTIPLE;  Surgeon: Bronson Curb, MD;  Location: [facility]EDURES [facility];  Service: Gastroenterology    PR COLONOSCOPY W/BIOPSY SINGLE/MULTIPLE N/A 09/19/2015    Procedure: COLONOSCOPY, FLEXIBLE, PROXIMAL TO SPLENIC FLEXURE; WITH BIOPSY, SINGLE OR MULTIPLE;  Surgeon: Mayford Knife, MD;  Location: [facility];  Service: Gastroenterology    PR COLSC FLX W/RMVL OF TUMOR POLYP LESION SNARE TQ N/A 09/19/2015    Procedure: COLONOSCOPY FLEX; W/REMOV TUMOR/LES BY SNARE;  Surgeon: Mayford Knife, MD;  [facility]: [facility];  Service: Gastroenterology    PR ENDOSCOPIC US EXAM, ESOPH N/A 08/10/2015    Procedure: UGI ENDOSCOPY; WITH ENDOSCOPIC ULTRASOUND EXAMINATION LIMITED TO THE ESOPHAGUS;  Surgeon: Vonda Antigua, MD;  Location: [facility];  Service: Gastroenterology    PR UPGI ENDOSCOPY,FN NEEDLE BX,GUIDED N/A 09/22/2014    Procedure: UGI W/TRANSENDOSCOPIC US-GUIDE INTRA/TRANSMURAL NEEDLE ASP/BX (INCL EXAM ESOPHAGUS, STOMACH, DOUDENUM/JEJ);  Surgeon: Ned Grace, MD;  [facility]: [facility];  Service: Gastroenterology    PR UPPER GI ENDOSCOPY,BIOPSY N/A 09/19/2015    Procedure: UGI ENDOSCOPY; WITH BIOPSY, SINGLE OR MULTIPLE;  Surgeon: Mayford Knife, MD;  [facility]: [facility];  Service: Gastroenterology    PR UPPER GI ENDOSCOPY,BIOPSY N/A 07/14/2017    Procedure: UGI ENDOSCOPY; WITH BIOPSY, SINGLE OR MULTIPLE;  Surgeon: Alfred Levins, MD;  Location: [facility];  Service: Gastroenterology       Medications:  Current Outpatient Medications   Medication Sig Dispense Refill    amLODIPine (NORVASC) 5 MG tablet TAKE ONE TABLET BY MOUTH DAILY 30 tablet 11    beclomethasone (BECONASE AQ) 42 mcg (0.042 %) nasal spray Place 2 sprays into each nostril two (2) times a day. 25 g 3    betamethasone, augmented, (DIPROLENE) 0.05 % ointment Apply twice daily to active areas until smooth. 100 g 5    cetirizine (ZYRTEC) 10 MG tablet Take 1 tablet (10 mg total) by mouth daily. 30 tablet 11    CREON 24,000-76,000 -120,000 unit CpDR delayed release capsule TAKE 1 CAPSULE BY MOUTH THREE TIMES DAILY WITH A MEAL 90 capsule 9    dupilumab (DUPIXENT PEN) 300 mg/2 mL PnIj Inject the contents of 1 pen (300 mg total) under the skin once a week. 8 mL 6    dupilumab 300 mg/2 mL PnIj Inject the contents of 1 pen (300 mg total) under the skin every seven (7) days. 8 mL 5    empty container Misc Use as directed to dispose of Dupixent pens. 1 each 2    enalapril (VASOTEC) 20 MG tablet TAKE ONE TABLET BY MOUTH EVERY DAY 90 tablet 2    gabapentin (NEURONTIN) 300 MG capsule Take 3 capsules (900 mg total) by mouth two (2) times a day. 270 capsule 6    halobetasol (ULTRAVATE) 0.05 % ointment APPLY TO AFFECTED AREA TWICE A DAY AS NEEDED AVOID FACE AND FOLDS 30 g 1    mirtazapine (REMERON) 30 MG tablet TAKE ONE TABLET BY MOUTH EVERY DAY 30 tablet 9    pantoprazole (PROTONIX) 20 MG tablet TAKE ONE TABLET BY MOUTH EVERY DAY 90 tablet 3    PREZCOBIX 800-150 mg-mg tablet TAKE ONE TABLET BY MOUTH DAILY 30 tablet 9    TIVICAY 50 mg TABLET TAKE ONE TABLET BY MOUTH DAILY 30 tablet 9    VENTOLIN HFA 90 mcg/actuation inhaler INHALE 2 PUFFS UP TO FOUR TIMES DAILY AS  NEEDED 18 g 11    hydrOXYzine (ATARAX) 25 MG tablet Take 1 tablet (25 mg total) by mouth at bedtime. Prn pruritus (Patient not taking: Reported on 05/30/2022) 30 tablet 0    meclizine (ANTIVERT) 25 mg tablet Take 1 tablet (25 mg total) by mouth Three (3) times a day as needed for dizziness. (Patient not taking: Reported on 05/30/2022)      triamcinolone (KENALOG) 0.1 % ointment Apply to itchy spots 2 times daily. For elbows at night time, apply medicine and then cover with saran wrap. (Patient not taking: Reported on 05/30/2022) 454 g 0     Current Facility-Administered Medications   Medication Dose Route Frequency Provider Last Rate Last Admin    triamcinolone acetonide (KENALOG-40) injection 40 mg  40 mg Intra-articular Once Evaristo Bury, PA           Allergies:  Sulfa (sulfonamide antibiotics) and Sulfur     Social History: works at [employer].  Patient  reports that he has been smoking cigarettes. He started smoking about 28 years ago. He has a 6.75 pack-year smoking history. He has been exposed to tobacco smoke. He has never used smokeless tobacco. He reports that he does not currently use alcohol after a past usage of about 3.0 standard drinks of alcohol per week. He reports that he does not currently use drugs after having used the following drugs: Marijuana.     Family History:  The patient's family history includes COPD in his father; Diabetes in his mother; Hypertension in his mother.     ROS:   A comprehensive 10-system review was negative, except as noted in HPI.    Physical Exam:  BP 130/80 (BP Site: L Arm, BP Position: Sitting, BP Cuff Size: Large)  - Pulse 76  - Resp 18  - Ht 165.1 cm (5' 5)  - Wt 71 kg (156 lb 9.6 oz)  - BMI 26.06 kg/m??     General: well developed, well nourished, no acute distress  HEENT: PERLA, EOM intact, normocephalic, atraumatic  Chest: symmetrical  Lungs: non-labored breathing  Heart: normal rhythm, no JVD  Abdomen: no tenderness, no masses or hernias, no palpable organomegaly  GU: uncircumcised, mild phimosis, able to retract foreskin but noted to have tight prepuce when retracted. Testes descended bilaterally. No masses/edema.  Extremities: no deformities, no edema, no cyanosis      Chemistry        Component Value Date/Time    NA 139 03/18/2022 1313    NA 142 05/03/2014 1032    K 3.9 03/18/2022 1313    K 3.9 08/30/2014 1020    CL 105 03/18/2022 1313    CL 104 05/03/2014 1032    CO2 26.2 03/18/2022 1313    CO2 27 05/03/2014 1032    BUN <5 (L) 03/18/2022 1313    BUN 9 05/13/2019 0803    CREATININE 0.73 03/18/2022 1313    CREATININE 0.84 05/13/2019 0803    GLU 101 03/18/2022 1313        Component Value Date/Time    CALCIUM 9.6 03/18/2022 1313    CALCIUM 8.6 05/03/2014 1032    ALKPHOS 143 (H) 03/18/2022 1313    ALKPHOS 85 05/03/2014 1032    AST 14 03/18/2022 1313    AST 14 05/13/2019 0803    ALT 12 03/18/2022 1313    ALT 10 05/13/2019 0803    BILITOT 0.3 03/18/2022 1313    BILITOT 0.6 05/03/2014 1032

## 2022-05-30 NOTE — Unmapped (Addendum)
Circumcision removes the skin that covers the head of the penis. This is called the foreskin. Your doctor will push the foreskin from the head of the penis. Then your doctor will trim the foreskin and sew down the edges. There are a number of ways to do this. You will have some small stitches. They will dissolve on their own. You will likely go home the same day as the surgery.      There is risk of bleeding, infection, and need for re-operation. There is a risk of poor cosmesis. Risk of swelling and pain.  There is a risk of penile discomfort and or numbness/decreased sensation.  Additionally, there are always risks associated with undergoing anytime of surgery that requires anesthesia such as (but not limited to) blood clot, heart attack, stroke, difficulty waking up from anesthesia, and even potential for death.

## 2022-06-18 NOTE — Unmapped (Signed)
Timothy Tapia reports his Dupixent continues to work well. He also likes his new topical medication and has found it more effective than others in the past.     [facility] Clinical Assessment & Refill Coordination Note    Kadeem Mckissic, DOB: 1965-10-28  Phone: [phone] (home) [phone] (work)    All above HIPAA information was verified with patient.     Was a Nurse, learning disability used for this call? No    Specialty Medication(s):   Inflammatory Disorders: Dupixent     Current Outpatient Medications   Medication Sig Dispense Refill    amLODIPine (NORVASC) 5 MG tablet TAKE ONE TABLET BY MOUTH DAILY 30 tablet 11    beclomethasone (BECONASE AQ) 42 mcg (0.042 %) nasal spray Place 2 sprays into each nostril two (2) times a day. 25 g 3    betamethasone, augmented, (DIPROLENE) 0.05 % ointment Apply twice daily to active areas until smooth. 100 g 5    cetirizine (ZYRTEC) 10 MG tablet Take 1 tablet (10 mg total) by mouth daily. 30 tablet 11    CREON 24,000-76,000 -120,000 unit CpDR delayed release capsule TAKE 1 CAPSULE BY MOUTH THREE TIMES DAILY WITH A MEAL 90 capsule 9    dupilumab (DUPIXENT PEN) 300 mg/2 mL PnIj Inject the contents of 1 pen (300 mg total) under the skin once a week. 8 mL 6    dupilumab 300 mg/2 mL PnIj Inject the contents of 1 pen (300 mg total) under the skin every seven (7) days. 8 mL 5    empty container Misc Use as directed to dispose of Dupixent pens. 1 each 2    enalapril (VASOTEC) 20 MG tablet TAKE ONE TABLET BY MOUTH EVERY DAY 90 tablet 2    gabapentin (NEURONTIN) 300 MG capsule Take 3 capsules (900 mg total) by mouth two (2) times a day. 270 capsule 6    halobetasol (ULTRAVATE) 0.05 % ointment APPLY TO AFFECTED AREA TWICE A DAY AS NEEDED AVOID FACE AND FOLDS 30 g 1    hydrOXYzine (ATARAX) 25 MG tablet Take 1 tablet (25 mg total) by mouth at bedtime. Prn pruritus (Patient not taking: Reported on 05/30/2022) 30 tablet 0    meclizine (ANTIVERT) 25 mg tablet Take 1 tablet (25 mg total) by mouth Three (3) times a day as needed for dizziness. (Patient not taking: Reported on 05/30/2022)      mirtazapine (REMERON) 30 MG tablet TAKE ONE TABLET BY MOUTH EVERY DAY 30 tablet 9    pantoprazole (PROTONIX) 20 MG tablet TAKE ONE TABLET BY MOUTH EVERY DAY 90 tablet 3    PREZCOBIX 800-150 mg-mg tablet TAKE ONE TABLET BY MOUTH DAILY 30 tablet 9    TIVICAY 50 mg TABLET TAKE ONE TABLET BY MOUTH DAILY 30 tablet 9    triamcinolone (KENALOG) 0.1 % ointment Apply to itchy spots 2 times daily. For elbows at night time, apply medicine and then cover with saran wrap. (Patient not taking: Reported on 05/30/2022) 454 g 0    VENTOLIN HFA 90 mcg/actuation inhaler INHALE 2 PUFFS UP TO FOUR TIMES DAILY AS NEEDED 18 g 11     Current Facility-Administered Medications   Medication Dose Route Frequency Provider Last Rate Last Admin    triamcinolone acetonide (KENALOG-40) injection 40 mg  40 mg Intra-articular Once Evaristo Bury, PA            Changes to medications: Imon reports no changes at this time.    Allergies   Allergen  Reactions    Sulfa (Sulfonamide Antibiotics) Hives    Sulfur Hives       Changes to allergies: No    SPECIALTY MEDICATION ADHERENCE     Dupixent - 0 left  Medication Adherence    Patient reported X missed doses in the last month: 0  Specialty Medication: Dupixent                            Specialty medication(s) dose(s) confirmed: Regimen is correct and unchanged.     Are there any concerns with adherence? No    Adherence counseling provided? Not needed    CLINICAL MANAGEMENT AND INTERVENTION      Clinical Benefit Assessment:    Do you feel the medicine is effective or helping your condition? Yes    Clinical Benefit counseling provided? Not needed    Adverse Effects Assessment:    Are you experiencing any side effects? No    Are you experiencing difficulty administering your medicine? No    Quality of Life Assessment:    Quality of Life    Rheumatology  Oncology  Dermatology  1. What impact has your specialty medication had on the symptoms of your skin condition (i.e. itchiness, soreness, stinging)?: Some  2. What impact has your specialty medication had on your comfort level with your skin?: Some  Cystic Fibrosis          How many days over the past month did your AD  keep you from your normal activities? For example, brushing your teeth or getting up in the morning. 0    Have you discussed this with your provider? Not needed    Acute Infection Status:    Acute infections noted within Epic:  No active infections  Patient reported infection: None    Therapy Appropriateness:    Is therapy appropriate and patient progressing towards therapeutic goals? Yes, therapy is appropriate and should be continued    DISEASE/MEDICATION-SPECIFIC INFORMATION      For patients on injectable medications: Patient currently has 0 doses left.  Next injection is scheduled for Thurs, 12/28.    Chronic Inflammatory Diseases: Have you experienced any flares in the last month? No  Has this been reported to your provider? No    PATIENT SPECIFIC NEEDS     Does the patient have any physical, cognitive, or cultural barriers? No    Is the patient high risk? No    Did the patient require a clinical intervention? No    Does the patient require physician intervention or other additional services (i.e., nutrition, smoking cessation, social work)? No    SOCIAL DETERMINANTS OF HEALTH     At the [facility], we have learned that life circumstances - like trouble affording food, housing, utilities, or transportation can affect the health of many of our patients.   That is why we wanted to ask: are you currently experiencing any life circumstances that are negatively impacting your health and/or quality of life? Patient declined to answer    Social Determinants of Health     Financial Resource Strain: Low Risk  (08/23/2021)    Overall Financial Resource Strain (CARDIA)     Difficulty of Paying Living Expenses: Not hard at all Internet Connectivity: Not on file   Food Insecurity: Food Insecurity Present (08/23/2021)    Hunger Vital Sign     Worried About Running Out of Food in the Last Year: Sometimes true  Ran Out of Food in the Last Year: Sometimes true   Tobacco Use: High Risk (05/30/2022)    Patient History     Smoking Tobacco Use: Every Day     Smokeless Tobacco Use: Never     Passive Exposure: Current   Housing/Utilities: Low Risk  (08/23/2021)    Housing/Utilities     Within the past 12 months, have you ever stayed: outside, in a car, in a tent, in an overnight shelter, or temporarily in someone else's home (i.e. couch-surfing)?: No     Are you worried about losing your housing?: No     Within the past 12 months, have you been unable to get utilities (heat, electricity) when it was really needed?: No   Alcohol Use: Not At Risk (08/23/2021)    Alcohol Use     How often do you have a drink containing alcohol?: Never     How many drinks containing alcohol do you have on a typical day when you are drinking?: 1 - 2     How often do you have 5 or more drinks on one occasion?: Never   Transportation Needs: No Transportation Needs (08/23/2021)    PRAPARE - Transportation     Lack of Transportation (Medical): No     Lack of Transportation (Non-Medical): No   Substance Use: Low Risk  (08/23/2021)    Substance Use     Taken prescription drugs for non-medical reasons: Never     Taken illegal drugs: Never     Patient indicated they have taken drugs in the past year for non-medical reasons: Yes, [positive answer(s)]: Not on file   Health Literacy: Low Risk  (08/23/2021)    Health Literacy     : Never   Physical Activity: Sufficiently Active (08/23/2021)    Exercise Vital Sign     Days of Exercise per Week: 5 days     Minutes of Exercise per Session: 30 min   Interpersonal Safety: Not on file   Stress: No Stress Concern Present (08/23/2021)    [employer] of Occupational Health - Occupational Stress Questionnaire     Feeling of Stress : Not at all   Intimate Partner Violence: Not At Risk (08/23/2021)    Humiliation, Afraid, Rape, and Kick questionnaire     Fear of Current or Ex-Partner: No     Emotionally Abused: No     Physically Abused: No     Sexually Abused: No   Depression: Not at risk (03/18/2022)    PHQ-2     PHQ-2 Score: 0   Social Connections: Moderately Isolated (08/23/2021)    Social Connection and Isolation Panel [NHANES]     Frequency of Communication with Friends and Family: More than three times a week     Frequency of Social Gatherings with Friends and Family: More than three times a week     Attends Religious Services: More than 4 times per year     Active Member of [employer] or Organizations: No     Attends Banker Meetings: Never     Marital Status: Separated       Would you be willing to receive help with any of the needs that you have identified today? Not applicable       SHIPPING     Specialty Medication(s) to be Shipped:   Inflammatory Disorders: Dupixent    Other medication(s) to be shipped: No additional medications requested for fill at this time     Changes to  insurance: No    Delivery Scheduled: Yes, Expected medication delivery date: 12/22.     Medication will be delivered via Same Day Courier to the confirmed prescription address in [facility].    The patient will receive a drug information handout for each medication shipped and additional FDA Medication Guides as required.  Verified that patient has previously received a Conservation officer, historic buildings and a Surveyor, mining.    The patient or caregiver noted above participated in the development of this care plan and knows that they can request review of or adjustments to the care plan at any time.      All of the patient's questions and concerns have been addressed.    Lanney Gins   [facility] Specialty Pharmacist

## 2022-06-24 ENCOUNTER — Emergency Department
Admit: 2022-06-24 | Discharge: 2022-06-24 | Disposition: A | Discharge status: Home / Self Care | Payer: MEDICARE | Attending: Student in an Organized Health Care Education/Training Program

## 2022-06-24 ENCOUNTER — Ambulatory Visit
Admit: 2022-06-24 | Discharge: 2022-06-24 | Disposition: A | Discharge status: Home / Self Care | Payer: MEDICARE | Attending: Student in an Organized Health Care Education/Training Program

## 2022-06-24 DIAGNOSIS — S92355A Nondisplaced fracture of fifth metatarsal bone, left foot, initial encounter for closed fracture: Principal | ICD-10-CM

## 2022-06-24 MED ORDER — OXYCODONE 5 MG TABLET
ORAL_TABLET | ORAL | 0 refills | 2 days | Status: CP | PRN
Start: 2022-06-24 — End: 2022-06-24

## 2022-06-24 MED ADMIN — oxyCODONE (ROXICODONE) immediate release tablet 5 mg: 5 mg | ORAL | @ 21:00:00 | Stop: 2022-06-24

## 2022-06-24 NOTE — Unmapped (Signed)
[facility] Emergency Department Provider Note      ED Course, Assessment and Plan     Initial Clinical Impression:    June 24, 2022 3:35 PM   Timothy Tapia is a 56 y.o. male hx of HIV, HTN, GERD, COPD, presents to the emergency room with left foot pain as described below. On exam, Vital signs stable.  Overall well-appearing.  Normal cardiopulmonary exam.  Normal abdominal exam.  Normal neurologic exam.  Exam remarkable for no tenderness to palpation of the medial or lateral malleoli, no tenderness with squeeze of the distal tibia-fibula syndesmosis, patient has been ambulatory.    Pulse 100  - SpO2 96%     Differential diagnosis includes Jones fracture versus dancers fracture versus other metatarsal fracture    Will obtain x-rays. Will treat patient with inventions as needed.    Discussion of Management with other Physicians, QHP, or Appropriate Source: Discussion with other professionals: Consultant - Ortho  Independent Interpretation of Studies: Independent interpretation: X-ray(s) - patient has a left fifth metatarsal midshaft fracture  External Records Reviewed: I have reviewed recent and relevant previous record, including: Outpatient notes - 05/20/2022 office visit with infectious disease for patient's HIV  Escalation of Care, Consideration of Admission/Observation/Transfer: Escalation of Care, Consideration of Admission/Observation/Transfer: However, patient was determined to be appropriate for outpatient management.  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 Patient placed in a hard sole shoe and given Ortho referral          _____________________________________________________________________    The case was discussed with attending physician who is in agreement with the above assessment and plan    Dictation software was used while making this note. Please excuse any errors made with dictation software.    Additional Medical Decision Making     I have reviewed the vital signs and the nursing notes. Labs and radiology results that were available during my care of the patient were independently reviewed by me and considered in my medical decision making.     I independently visualized the EKG tracing if performed  I independently visualized the radiology images if performed  I reviewed the patient's prior medical records if available.  Additional history obtained from family if available    History     CHIEF COMPLAINT:   Chief Complaint   Patient presents with    Foot Injury       HPI: Timothy Tapia is a 56 y.o. male hx of HIV, HTN, GERD, COPD, presents to the emergency room with left foot pain    Patient stated he was walking down the steps at church yesterday, missed the last step and twisted his left ankle.  Reports that he felt like the bone snapped on the outside of his foot.  States that he is having more difficulty walking and pain and presented to the emergency room for evaluation.    Patient denies falling, hitting his head, or injuring any other part of his body.  Denies being on any anticoagulation.  Denies history of long-term steroid use.    PAST MEDICAL HISTORY/PAST SURGICAL HISTORY:   Past  Medical History:   Diagnosis Date    Acute pancreatitis     Barrett esophagus     Black stool 07/08/2017    Bronchitis     Colon polyp     Eczema     Gastroesophageal reflux disease     HIV disease (CMS-HCC)     Hypertension     Lack of access to transportation     Pancreatic pseudocyst     Thrombus     IMV thrombus r/t colitis & pancreatitis 10/07/13    Visual impairment        Past Surgical History:   Procedure Laterality Date    esophogus surgery      PR COLONOSCOPY W/BIOPSY SINGLE/MULTIPLE  10/11/2013    Procedure: COLONOSCOPY, FLEXIBLE, PROXIMAL TO SPLENIC FLEXURE; WITH BIOPSY, SINGLE OR MULTIPLE;  Surgeon: Teodoro Spray, MD;  [facility];  Service: Gastroenterology    PR COLONOSCOPY W/BIOPSY SINGLE/MULTIPLE N/A 09/29/2014    Procedure: COLONOSCOPY, FLEXIBLE, PROXIMAL TO SPLENIC FLEXURE; WITH BIOPSY, SINGLE OR MULTIPLE;  Surgeon: Bronson Curb, MD;  Location: [facility];  Service: Gastroenterology    PR COLONOSCOPY W/BIOPSY SINGLE/MULTIPLE N/A 09/19/2015    Procedure: COLONOSCOPY, FLEXIBLE, PROXIMAL TO SPLENIC FLEXURE; WITH BIOPSY, SINGLE OR MULTIPLE;  Surgeon: Mayford Knife, MD;  [facility];  Service: Gastroenterology    PR COLSC FLX W/RMVL OF TUMOR POLYP LESION SNARE TQ N/A 09/19/2015    Procedure: COLONOSCOPY FLEX; W/REMOV TUMOR/LES BY SNARE;  Surgeon: Mayford Knife, MD;  Location: [facility];  Service: Gastroenterology    PR ENDOSCOPIC US EXAM, ESOPH N/A 08/10/2015    Procedure: UGI ENDOSCOPY; WITH ENDOSCOPIC ULTRASOUND EXAMINATION LIMITED TO THE ESOPHAGUS;  Surgeon: Vonda Antigua, MD;  [facility];  Service: Gastroenterology    PR UPGI ENDOSCOPY,FN NEEDLE BX,GUIDED N/A 09/22/2014    Procedure: UGI W/TRANSENDOSCOPIC US-GUIDE INTRA/TRANSMURAL NEEDLE ASP/BX (INCL EXAM ESOPHAGUS, STOMACH, DOUDENUM/JEJ);  Surgeon: Ned Grace, MD;  Location: [facility];  Service: Gastroenterology    PR UPPER GI ENDOSCOPY,BIOPSY N/A 09/19/2015    Procedure: UGI ENDOSCOPY; WITH BIOPSY, SINGLE OR MULTIPLE;  Surgeon: Mayford Knife, MD;  [facility];  Service: Gastroenterology    PR UPPER GI ENDOSCOPY,BIOPSY N/A 07/14/2017    Procedure: UGI ENDOSCOPY; WITH BIOPSY, SINGLE OR MULTIPLE;  Surgeon: Alfred Levins, MD; Location: [facility];  Service: Gastroenterology       MEDICATIONS:     Current Facility-Administered Medications:     triamcinolone acetonide (KENALOG-40) injection 40 mg, 40 mg, Intra-articular, Once, Evaristo Bury, PA    Current Outpatient Medications:     amLODIPine (NORVASC) 5 MG tablet, TAKE ONE TABLET BY MOUTH DAILY, Disp: 30 tablet, Rfl: 11    beclomethasone (BECONASE AQ) 42 mcg (0.042 %) nasal spray, Place 2 sprays into each nostril two (2) times a day., Disp: 25 g, Rfl: 3    betamethasone, augmented, (DIPROLENE) 0.05 % ointment, Apply twice daily to active areas until smooth., Disp: 100 g, Rfl: 5    cetirizine (ZYRTEC) 10 MG tablet, Take 1 tablet (10 mg total) by mouth daily., Disp: 30 tablet, Rfl: 11    CREON 24,000-76,000 -120,000 unit CpDR delayed release capsule, TAKE 1 CAPSULE BY MOUTH THREE TIMES DAILY WITH A MEAL, Disp: 90 capsule, Rfl: 9    dupilumab (DUPIXENT PEN) 300 mg/2 mL PnIj, Inject the contents of 1 pen (300 mg total) under the  skin once a week., Disp: 8 mL, Rfl: 6    dupilumab 300 mg/2 mL PnIj, Inject the contents of 1 pen (300 mg total) under the skin every seven (7) days., Disp: 8 mL, Rfl: 5    empty container Misc, Use as directed to dispose of Dupixent pens., Disp: 1 each, Rfl: 2    enalapril (VASOTEC) 20 MG tablet, TAKE ONE TABLET BY MOUTH EVERY DAY, Disp: 90 tablet, Rfl: 2    gabapentin (NEURONTIN) 300 MG capsule, Take 3 capsules (900 mg total) by mouth two (2) times a day., Disp: 270 capsule, Rfl: 6    halobetasol (ULTRAVATE) 0.05 % ointment, APPLY TO AFFECTED AREA TWICE A DAY AS NEEDED AVOID FACE AND FOLDS, Disp: 30 g, Rfl: 1    hydrOXYzine (ATARAX) 25 MG tablet, Take 1 tablet (25 mg total) by mouth at bedtime. Prn pruritus (Patient not taking: Reported on 05/30/2022), Disp: 30 tablet, Rfl: 0    meclizine (ANTIVERT) 25 mg tablet, Take 1 tablet (25 mg total) by mouth Three (3) times a day as needed for dizziness. (Patient not taking: Reported on 05/30/2022), Disp: , Rfl:     mirtazapine (REMERON) 30 MG tablet, TAKE ONE TABLET BY MOUTH EVERY DAY, Disp: 30 tablet, Rfl: 9    pantoprazole (PROTONIX) 20 MG tablet, TAKE ONE TABLET BY MOUTH EVERY DAY, Disp: 90 tablet, Rfl: 3    PREZCOBIX 800-150 mg-mg tablet, TAKE ONE TABLET BY MOUTH DAILY, Disp: 30 tablet, Rfl: 9    TIVICAY 50 mg TABLET, TAKE ONE TABLET BY MOUTH DAILY, Disp: 30 tablet, Rfl: 9    triamcinolone (KENALOG) 0.1 % ointment, Apply to itchy spots 2 times daily. For elbows at night time, apply medicine and then cover with saran wrap. (Patient not taking: Reported on 05/30/2022), Disp: 454 g, Rfl: 0    VENTOLIN HFA 90 mcg/actuation inhaler, INHALE 2 PUFFS UP TO FOUR TIMES DAILY AS NEEDED, Disp: 18 g, Rfl: 11    ALLERGIES:   Sulfa (sulfonamide antibiotics) and Sulfur    SOCIAL HISTORY:   Social History     Tobacco Use    Smoking status: Every Day     Current packs/day: 0.25     Average packs/day: 0.3 packs/day for 29.0 years (7.2 ttl pk-yrs)     Types: Cigarettes     Start date: 06/28/1993     Passive exposure: Current    Smokeless tobacco: Never    Tobacco comments:     1 pack lasts 4 days; referred to [facility]; Taking nicorette gum   Substance Use Topics    Alcohol use: Not Currently     Alcohol/week: 3.0 standard drinks of alcohol     Types: 3 Cans of beer per week     Comment: Na       FAMILY HISTORY:  Family History   Problem Relation Age of Onset    Hypertension Mother     Diabetes Mother     COPD Father     Pancreatic cancer Neg Hx     Pancreatitis Neg Hx     Melanoma Neg Hx     Basal cell carcinoma Neg Hx     Squamous cell carcinoma Neg Hx         Physical Exam     VITAL SIGNS:    Pulse 100  - SpO2 96%     Constitutional: Alert and oriented. Well appearing and in no distress.  Eyes: Conjunctivae are normal.  ENT  Head: Normocephalic and atraumatic.       Nose: No congestion.       Mouth/Throat: Mucous membranes are moist.       Neck: No stridor.  Cardiovascular: Normal rate, regular rhythm. 2+ radial pulses equal bilaterally. <2 second cap refill.  Respiratory: Normal respiratory effort. Breath sounds are normal.  Gastrointestinal: Soft and nontender.   Genitourinary: No suprapubic tenderness  Musculoskeletal: Normal range of motion in all extremities.  Patient has ecchymosis and edema to the left lateral midfoot with tenderness from the styloid process to the base of the metatarsal.  No tenderness to the medial malleoli or lateral malleoli.  No pain or tenderness with squeeze along the distal aspect of the leg.  DP pulses intact  Neurologic: Normal speech and language. No gross focal neurologic deficits are appreciated.  Skin: Skin is warm, dry. No rash noted.  Psychiatric: Mood and affect are normal. Speech and behavior are normal.         Radiology     No orders to display     No results found.        Pertinent labs & imaging results that were available during my care of the patient were reviewed by me and considered in my medical decision making (see chart for details).    Please note- This chart has been created using [employer]. Chart creation errors have been sought, but may not always be located and such creation errors, especially pronoun confusion, do NOT reflect on the standard of medical care.       Rico Junker, MD  Resident  06/25/22 [phone]

## 2022-06-24 NOTE — Unmapped (Signed)
Pt here with L foot pain, reported twisted it yesterday and reports swelling now. Pt ambulated to NF

## 2022-07-02 ENCOUNTER — Ambulatory Visit: Admit: 2022-07-02 | Discharge: 2022-07-03 | Payer: MEDICARE

## 2022-07-02 NOTE — Unmapped (Signed)
ORTHOPAEDIC NOTE     Timothy Oatis L. Carlous Olivares, PA-C        Timothy Tapia    MRN: [id number]  DOB: [birth year]    Date of visit: 07/02/2022    Clinic location: [facility]     ASSESSMENT:     Displaced left fifth metatarsal shaft fracture sustained on 06/23/2022     PLAN:     Patient understands that he has displaced fracture but should do well with nonoperative treatment  He will continue his postoperative shoe weightbearing as tolerated  -Advised OTC analgesic PRN pain  -Discussed treatment options and patient was amenable to the above plan and was instructed to call and be seen if there is any increasing pain or concerns.     Follow up: 5 weeks with Asa Lente, 3 views left foot weightbearing       Chief Complaint:     Left foot injury     SUBJECTIVE:     HPI: Timothy Tapia is a  57 y.o. with a PMHx T2DM, HIV, HTN, GERD, COPD presenting to Clinic for evaluation of left foot injury sustained on 06/23/2022 when he was descending stairs to the church missed a step twisted his foot.  He presented to the emergency department on 06/24/2022 and comminuted fifth metatarsal shaft fracture was noted.  He was placed in a postop shoe weightbearing       Allergies  Allergies   Allergen Reactions    Sulfa (Sulfonamide Antibiotics) Hives    Sulfur Hives     Past Medical History  Past Medical History:   Diagnosis Date    Acute pancreatitis     Barrett esophagus     Black stool 07/08/2017    Bronchitis     Colon polyp     Eczema     Gastroesophageal reflux disease     HIV disease (CMS-HCC)     Hypertension     Lack of access to transportation     Pancreatic pseudocyst     Thrombus     IMV thrombus r/t colitis & pancreatitis 10/07/13    Visual impairment         PHYSICAL EXAM:     MSK: Left foot  Inspection: Mild edema without erythema or ecchymosis, skin intact  Palpation: Tenderness along the fifth metatarsal shaft  ROM: Full range of motion of foot and ankle  Strength: Full strength each plane of range of motion of foot and ankle  normal sensation LLE  Dorsalis pedal pulses easily palpable      Imaging   Three views of the left WB Foot independently reviewed and interpreted by myself show displaced fifth metatarsal shaft fracture. No other obvious fractures, lucencies, dislocations, or acute abnormalities.    MEDICAL DECISION MAKING (level of service defined by 2/3 elements)     Number/Complexity of Problems Addressed 1 acute, uncomplicated illness or injury (99203/99213)   Amount/Complexity of Data to be Reviewed/Analyzed Independent interpretation of a test performed by another physician/other qualified health care professional (99204/99214)   Risk of Complications/Morbidity/Mortality of Management Closed Fracture Treatment WITHOUT Manipulation (99204/99214)   DME ORDER:  Dx:  ,                   cc:  Minerva Fester, MD  *Patient note was created using Dragon Dictation sotware. Errors in syntax or grammar may not have been identified and edited on initial review.

## 2022-07-07 MED ORDER — GABAPENTIN 300 MG CAPSULE
ORAL_CAPSULE | 6 refills | 0 days
Start: 2022-07-07 — End: ?

## 2022-07-08 MED ORDER — GABAPENTIN 300 MG CAPSULE
ORAL_CAPSULE | 1 refills | 0 days | Status: CP
Start: 2022-07-08 — End: ?

## 2022-07-08 NOTE — Unmapped (Signed)
Medication Requested: Gabapentin       Future Appointments   Date Time Provider Department Center   08/06/2022  9:00 AM Ancil Linsey, NP [facility]   08/07/2022 10:00 AM [place] [id number]     Per Provider Note: Gabapentin 900 mg at bedtime is helping he says. Will try to escalate to 900 mg BID slowly. Watch for daytime somnolence.     Standing order protocol requirements met?: Yes    Sent to: Pharmacy per protocol    Days Supply Given: 90 days  Number of Refills: 1

## 2022-07-16 NOTE — Unmapped (Signed)
[facility] Refill Coordination Note    Specialty Medication(s) to be Shipped:   Inflammatory Disorders: Dupixent    Other medication(s) to be shipped: No additional medications requested for fill at this time     Timothy Tapia, DOB: [birth year]  Phone: [phone] (home) [phone] (work)      All above HIPAA information was verified with patient.     Was a Nurse, learning disability used for this call? No    Completed refill call assessment today to schedule patient's medication shipment from the [facility] [phone]).  All relevant notes have been reviewed.     Specialty medication(s) and dose(s) confirmed: Regimen is correct and unchanged.   Changes to medications: [name] reports no changes at this time.  Changes to insurance: No  New side effects reported not previously addressed with a pharmacist or physician: None reported  Questions for the pharmacist: No    Confirmed patient received a Conservation officer, historic buildings and a Surveyor, mining with first shipment. The patient will receive a drug information handout for each medication shipped and additional FDA Medication Guides as required.       DISEASE/MEDICATION-SPECIFIC INFORMATION        For patients on injectable medications: Patient currently has 1 doses left.  Next injection is scheduled for 07/18/2022.    SPECIALTY MEDICATION ADHERENCE     Medication Adherence    Patient reported X missed doses in the last month: 0  Specialty Medication: Dupixent  Patient is on additional specialty medications: No                                Were doses missed due to medication being on hold? No    Dupixent 300/2 mg/ml: 2 days of medicine on hand       REFERRAL TO PHARMACIST     Referral to the pharmacist: Not needed      [facility]     Shipping address confirmed in Epic.     Delivery Scheduled: Yes, Expected medication delivery date: 07/25/2022.     Medication will be delivered via Same Day Courier to the prescription address in Epic WAM.    [name]   [facility] Specialty Technician

## 2022-07-25 MED FILL — DUPIXENT 300 MG/2 ML SUBCUTANEOUS PEN INJECTOR: SUBCUTANEOUS | 28 days supply | Qty: 8 | Fill #1

## 2022-08-01 DIAGNOSIS — A599 Trichomoniasis, unspecified: Principal | ICD-10-CM

## 2022-08-01 MED ORDER — METRONIDAZOLE 500 MG TABLET
ORAL_TABLET | Freq: Once | ORAL | 0 refills | 1 days | Status: CP
Start: 2022-08-01 — End: 2022-08-02
  Filled 2022-08-02: qty 4, 1d supply, fill #0

## 2022-08-01 NOTE — Unmapped (Signed)
Timothy Tapia  P [place] [facility] Clinical Staff  Caller: Unspecified (Today,  2:06 PM)  Incoming Call  Caller: [name]  Best callback number: [phone]  Reason for call: Patient called stating that his girlfriend has Trichomoniasis and wanted to see if it could be passed to him.      Returned call. States understanding that he would need a prescription. Would like it sent to [facility] pharmacy. Will let provider know.

## 2022-08-06 ENCOUNTER — Ambulatory Visit: Admit: 2022-08-06 | Discharge: 2022-08-07 | Payer: MEDICARE

## 2022-08-06 NOTE — Unmapped (Signed)
Orthopaedic Foot and Ankle Division  Encounter Provider: Ancil Linsey, NP  Date of Service: 08/06/2022 Last encounter Orthopaedics: Visit date not found   Last encounter this provider: Visit date not found      Notes:         Primary Care Provider: Minerva Fester, MD  Referring Provider: Rolene Course [facility]    ICD-10-CM   1. Nondisplaced fracture of fifth metatarsal bone, left foot, initial encounter for closed fracture  S92.355A    Orthopaedic notes: HTN. hx DVT, COPD, HIV  MR-L 03/2021: L5-S1 foram. stenosis    Plan for L5-S1 ALIF    Physical Function CAT Score: 56.8 (08/06/22)  Pain Interference CAT Score: 56 (08/06/22)  Depression CAT Score: (not recorded)  Sleep CAT Score: (not recorded)  [email or web address]     Timothy Tapia is a 57 y.o. male       Assessment: Healing left fifth metatarsal fracture sustained 06/23/2022        PLAN:     Patient may continue in his usual shoes.  Callus plantar to the third metatarsal head was debrided using 15 blade.  Patient tolerated this well.  More comfortable after debridement.  We discussed using a pumice stone for home care.  Encouraged patient to reach out with further questions or concerns. Follow-up with me as needed    Requested Prescriptions      No prescriptions requested or ordered in this encounter      Orders Placed This Encounter   Procedures    XR Foot 3 Or More Views Left       History:  (Data reviewed and verified by encounter provider.)  Chief Complaint   Patient presents with    Left Foot - Fracture, Follow-up     HPI:  57 y.o. male who reports that his foot is feeling much better.  States that he was wearing the cam boot but it resulted in a callus on the plantar surface of his foot.  For this reason he transitioned back into his usual shoes.  He no longer having any pain in his foot other than from the callus.  Pain Assessment: No/denies pain  Pain Location: Toe (Comment which one) (fifth)  Pain Orientation: Left    Medical History Past Medical History:   Diagnosis Date    Acute pancreatitis     Barrett esophagus     Black stool 07/08/2017    Bronchitis     Colon polyp     Eczema     Gastroesophageal reflux disease     HIV disease (CMS-HCC)     Hypertension     Lack of access to transportation     Pancreatic pseudocyst     Thrombus     IMV thrombus r/t colitis & pancreatitis 10/07/13    Visual impairment       Surgical History Past Surgical History:   Procedure Laterality Date    esophogus surgery      PR COLONOSCOPY W/BIOPSY SINGLE/MULTIPLE  10/11/2013    Procedure: COLONOSCOPY, FLEXIBLE, PROXIMAL TO SPLENIC FLEXURE; WITH BIOPSY, SINGLE OR MULTIPLE;  Surgeon: Teodoro Spray, MD;  [facility]: [facility];  Service: Gastroenterology    PR COLONOSCOPY W/BIOPSY SINGLE/MULTIPLE N/A 09/29/2014    Procedure: COLONOSCOPY, FLEXIBLE, PROXIMAL TO SPLENIC FLEXURE; WITH BIOPSY, SINGLE OR MULTIPLE;  Surgeon: Bronson Curb, MD;  Location: [facility];  Service: Gastroenterology    PR COLONOSCOPY W/BIOPSY SINGLE/MULTIPLE N/A 09/19/2015    Procedure: COLONOSCOPY, FLEXIBLE,  PROXIMAL TO SPLENIC FLEXURE; WITH BIOPSY, SINGLE OR MULTIPLE;  Surgeon: Mayford Knife, MD;  Location: GI [facility]URES [facility];  Service: Gastroenterology    PR COLSC FLX W/RMVL OF TUMOR POLYP LESION SNARE TQ N/A 09/19/2015    Procedure: COLONOSCOPY FLEX; W/REMOV TUMOR/LES BY SNARE;  Surgeon: Mayford Knife, MD;  Location: [facility]URES [facility];  Service: Gastroenterology    PR ENDOSCOPIC US EXAM, ESOPH N/A 08/10/2015    Procedure: UGI ENDOSCOPY; WITH ENDOSCOPIC ULTRASOUND EXAMINATION LIMITED TO THE ESOPHAGUS;  Surgeon: Vonda Antigua, MD;  Location: [facility];  Service: Gastroenterology    PR UPGI ENDOSCOPY,FN NEEDLE BX,GUIDED N/A 09/22/2014    Procedure: UGI W/TRANSENDOSCOPIC US-GUIDE INTRA/TRANSMURAL NEEDLE ASP/BX (INCL EXAM ESOPHAGUS, STOMACH, DOUDENUM/JEJ);  Surgeon: Ned Grace, MD;  Location: GI [facility]URES [facility];  Service: Gastroenterology    PR UPPER GI ENDOSCOPY,BIOPSY N/A 09/19/2015    Procedure: UGI ENDOSCOPY; WITH BIOPSY, SINGLE OR MULTIPLE;  Surgeon: Mayford Knife, MD;  Location: [facility];  Service: Gastroenterology    PR UPPER GI ENDOSCOPY,BIOPSY N/A 07/14/2017    Procedure: UGI ENDOSCOPY; WITH BIOPSY, SINGLE OR MULTIPLE;  Surgeon: Alfred Levins, MD;  Location: [facility];  Service: Gastroenterology      Allergies Sulfa (sulfonamide antibiotics) and Sulfur   Medications He has a current medication list which includes the following prescription(s): amlodipine, beconase aq, betamethasone (augmented), cetirizine, creon, dupilumab, empty container, enalapril, gabapentin, halobetasol, hydroxyzine, meclizine, mirtazapine, pantoprazole, prezcobix, tivicay, triamcinolone, and ventolin hfa, and the following Facility-Administered Medications: triamcinolone acetonide.   Family History His family history includes COPD in his father; Diabetes in his mother; Hypertension in his mother.   Social History He reports that he has been smoking cigarettes. He started smoking about 29 years ago. He has a 7.3 pack-year smoking history. He has been exposed to tobacco smoke. He has never used smokeless tobacco. He reports that he does not currently use alcohol after a past usage of about 3.0 standard drinks of alcohol per week. He reports that he does not currently use drugs after having used the following drugs: Marijuana.Home [address]  [place] [address]  Occupation:         Occupational History    Not on file     Social History     Social History Narrative    Works as a Nutritional therapist.            Exam:  The encounter diagnosis was Nondisplaced fracture of fifth metatarsal bone, left foot, initial encounter for closed fracture.   Estimated body mass index is 26.06 kg/m?? as calculated from the following:    Height as of 05/30/22: 165.1 cm (5' 5).    Weight as of 05/30/22: 71 kg (156 lb 9.6 oz).     Musculoskeletal   There is a tender callus plantar to the third metatarsal head.  Patient is nontender dorsally over the metatarsals.  He does not have pain with ankle motion.  Gait is even and steady       Pulses well-perfused distally, 2+ DP and posterior tibial pulses    Swelling no swelling        Test Results  The encounter diagnosis was Nondisplaced fracture of fifth metatarsal bone, left foot, initial encounter for closed fracture.  Lab Results   Component Value Date    A1C 5.8 (H) 05/20/2022       No results found for: VITD    Imaging  Orders Placed This Encounter   Procedures    XR Foot 3 Or More Views Left     Radiology studies were ordered and personally reviewed and interpreted by the encounter provider today..  Stable alignment, evidence of healing.        DME ORDER:  Dx:  ,

## 2022-08-06 NOTE — Unmapped (Signed)
Thank you for choosing [facility]!  We appreciate the opportunity to participate in your care.      If any questions or concerns arise after your visit, please do not hesitant to contact me by [facility] or by calling our clinical team at [phone]    Please let me know if I can be of assistance with this or other orthopaedic issues in the future.      Pumice stone for callus

## 2022-08-07 ENCOUNTER — Institutional Professional Consult (permissible substitution): Admit: 2022-08-07 | Discharge: 2022-08-08 | Payer: MEDICARE

## 2022-08-07 ENCOUNTER — Ambulatory Visit: Admit: 2022-08-07 | Discharge: 2022-08-08 | Payer: MEDICARE

## 2022-08-07 DIAGNOSIS — Z006 Encounter for examination for normal comparison and control in clinical research program: Principal | ICD-10-CM

## 2022-08-07 LAB — CARBOXYHEMOGLOBIN: CARBOXYHEMOGLOBIN: 6.1 % — ABNORMAL HIGH (ref <1.2)

## 2022-08-14 MED ORDER — ALBUTEROL SULFATE HFA 90 MCG/ACTUATION AEROSOL INHALER
11 refills | 0 days | Status: CP
Start: 2022-08-14 — End: ?

## 2022-08-14 NOTE — Unmapped (Signed)
Medication Requested: Ventolin HFA 90 mcg/actuation inhaler       Future Appointments   Date Time Provider Department Center   08/28/2022  2:40 PM [facility] RM 1 [facility] [place] [facility]   09/09/2022 11:00 AM Wohl, Rolene Course, MD [facility] [place]     Per Provider Note:     Standing order protocol requirements met?: Yes    Sent to: Pharmacy per protocol    Days Supply Given: 30 days  Number of Refills: 11

## 2022-08-16 NOTE — Unmapped (Signed)
[facility] Refill Coordination Note    Specialty Medication(s) to be Shipped:   Inflammatory Disorders: Dupixent    Other medication(s) to be shipped: No additional medications requested for fill at this time     Timothy Tapia, DOB: 17-Aug-1965  Phone: [phone] (home) [phone] (work)      All above HIPAA information was verified with patient.     Was a Nurse, learning disability used for this call? No    Completed refill call assessment today to schedule patient's medication shipment from the [facility] [phone]).  All relevant notes have been reviewed.     Specialty medication(s) and dose(s) confirmed: Regimen is correct and unchanged.   Changes to medications: Timothy Tapia reports no changes at this time.  Changes to insurance: No  New side effects reported not previously addressed with a pharmacist or physician: None reported  Questions for the pharmacist: No    Confirmed patient received a Conservation officer, historic buildings and a Surveyor, mining with first shipment. The patient will receive a drug information handout for each medication shipped and additional FDA Medication Guides as required.       DISEASE/MEDICATION-SPECIFIC INFORMATION        For patients on injectable medications: Patient currently has 1 doses left.  Next injection is scheduled for 2/21.    SPECIALTY MEDICATION ADHERENCE     Medication Adherence    Patient reported X missed doses in the last month: 0  Specialty Medication: Dupixent  Patient is on additional specialty medications: No  Informant: patient              Were doses missed due to medication being on hold? No    Dupixent 300/2 mg/ml: 5 days of medicine on hand       REFERRAL TO PHARMACIST     Referral to the pharmacist: Not needed      [facility]     Shipping address confirmed in Epic.     Patient was notified of new phone menu : Yes: .    Delivery Scheduled: Yes, Expected medication delivery date: 08/21/2022.     Medication will be delivered via Same Day Courier to the prescription address in Epic WAM.    [name]   [facility] Specialty Technician

## 2022-08-21 MED FILL — DUPIXENT 300 MG/2 ML SUBCUTANEOUS PEN INJECTOR: SUBCUTANEOUS | 28 days supply | Qty: 8 | Fill #2

## 2022-08-28 ENCOUNTER — Ambulatory Visit: Admit: 2022-08-28 | Discharge: 2022-08-29 | Payer: MEDICARE

## 2022-08-28 MED ORDER — ALBUTEROL SULFATE HFA 90 MCG/ACTUATION AEROSOL INHALER
0 refills | 0 days | Status: CP
Start: 2022-08-28 — End: ?

## 2022-09-05 DIAGNOSIS — N471 Phimosis: Principal | ICD-10-CM

## 2022-09-09 ENCOUNTER — Ambulatory Visit: Admit: 2022-09-09 | Discharge: 2022-09-09 | Payer: MEDICARE

## 2022-09-09 ENCOUNTER — Ambulatory Visit
Admit: 2022-09-09 | Discharge: 2022-09-09 | Payer: MEDICARE | Attending: Infectious Disease | Primary: Infectious Disease

## 2022-09-09 DIAGNOSIS — Z006 Encounter for examination for normal comparison and control in clinical research program: Principal | ICD-10-CM

## 2022-09-09 DIAGNOSIS — E785 Hyperlipidemia, unspecified: Principal | ICD-10-CM

## 2022-09-09 DIAGNOSIS — B20 Human immunodeficiency virus [HIV] disease: Principal | ICD-10-CM

## 2022-09-09 DIAGNOSIS — E119 Type 2 diabetes mellitus without complications: Principal | ICD-10-CM

## 2022-09-09 LAB — LIPID PANEL
CHOLESTEROL/HDL RATIO SCREEN: 4.3 (ref 1.0–4.5)
CHOLESTEROL: 184 mg/dL (ref <=200)
HDL CHOLESTEROL: 43 mg/dL (ref 40–60)
LDL CHOLESTEROL CALCULATED: 127 mg/dL — ABNORMAL HIGH (ref 40–99)
NON-HDL CHOLESTEROL: 141 mg/dL — ABNORMAL HIGH (ref 70–130)
TRIGLYCERIDES: 72 mg/dL (ref 0–150)
VLDL CHOLESTEROL CAL: 14.4 mg/dL (ref 12–47)

## 2022-09-09 LAB — HEMOGLOBIN A1C
ESTIMATED AVERAGE GLUCOSE: 120 mg/dL
HEMOGLOBIN A1C: 5.8 % — ABNORMAL HIGH (ref 4.8–5.6)

## 2022-09-09 LAB — HIV RNA, QUANTITATIVE, PCR
HIV RNA QNT RSLT: DETECTED — AB
HIV RNA: <20 {copies}/mL — ABNORMAL HIGH (ref <0)

## 2022-09-09 MED ORDER — SILDENAFIL 50 MG TABLET
ORAL_TABLET | Freq: Every day | ORAL | 0 refills | 10 days | Status: CP | PRN
Start: 2022-09-09 — End: 2022-10-09
  Filled 2022-09-09: qty 10, 10d supply, fill #0

## 2022-09-09 MED ORDER — ATORVASTATIN 20 MG TABLET
ORAL_TABLET | Freq: Every day | ORAL | 5 refills | 30 days | Status: CP
Start: 2022-09-09 — End: ?
  Filled 2022-09-09: qty 30, 30d supply, fill #0

## 2022-09-09 NOTE — Unmapped (Signed)
PCP:  Minerva Fester, MD    09/09/2022    This is a RETURN visit for this 57 y.o. male with the following diagnoses:    Patient Active Problem List    Diagnosis Date Noted    Human immunodeficiency virus (HIV) disease (CMS-HCC) 02/04/1995    Phimosis 05/30/2022    Uncircumcised male 05/30/2022    Chronic bilateral low back pain 11/09/2019    COPD (chronic obstructive pulmonary disease) (CMS-HCC) 05/27/2017    Hypokalemia 05/27/2017    GERD (gastroesophageal reflux disease) 05/27/2017    Pancreatic pseudocyst 07/06/2015    GI bleeding 07/06/2015    ETOH abuse 07/06/2015    DVT (deep venous thrombosis), IMV thrombus 10/07/13 provoked per hematology (14mo therapy) 10/25/2013    Colitis 10/12/2013    Pancreatitis 02/15/2013    Tobacco use disorder 02/15/2013    HTN (hypertension) 02/15/2013    Barrett esophagus 02/15/2013    Asthma 02/15/2013    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  Swears he never misses a dose.   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  Is seen [facility]   Intraarticular corticosteroids had helped but no longer.  Now with neck and right shoulder pain.   Gabapentin 900 mg at bedtime is helping he says. Will try to escalate to 900 mg BID slowly. Watch for daytime somnolence.   Check MRI cervical spine  May need Neurosurgery consult  Spine Clinic involved    Hx Pancreatitis - Now quiescent   Old MRI shown that prior pancreatic pseudocyst resolved  On Creon     Barrett's Espohagitis  On PPI  Has some dysphagia  Will ask GI to see and consider repeat EGD given Barretts and symptoms     Smoking   Smoking still  Says trying to stop     Mental Health  See above  On Remeron 30 mg at bedtime      Health Maintenance   Immunizations:  HBV immune. HCV antibody positive but HCV viral load negative so not infected.   C19 fully vaccinated and boosted w bivalent.   Refuses flu shot.   Cancer Screening:   Colonoscopy in 2017. Multiple polyps. Repeat.   Barrett's so EGDs  HTN: Controlled today. Will watch. On ACEi plus Ca++ bloocker.   Derm: Dermatology seeing for Atopic like dermatitis. On Dupixent.   Sinus congestion: Beconase nasal spray -  less interaction with Pezcobix.     Will follow up in 12 months.       Chief Complaint: Routine HIV follow-up    HPI:  Mr. Timothy Tapia returns for a routine appointment. See detailed A/P.     HIV-wise: Mostly stable CD4 and HIV RNA. Reports good adherence to ART.      ROS:   No fever chills, sweats, nausea, vomiting, diarrhea, rash, headache, joint pain. Neg for epigastric pain. Pos for LBP, nasal stuffiness    All other systems are negative.    ALLERGIES:  Sulfa (sulfonamide antibiotics) and Sulfur    MEDICATIONS:  Current Outpatient Medications   Medication Sig Dispense Refill    albuterol HFA 90 mcg/actuation inhaler Inhale 2 puffs by mouth prior to study CT scan 8.5 g 0    amLODIPine (NORVASC) 5 MG tablet TAKE ONE TABLET BY MOUTH DAILY 30 tablet 11    beclomethasone (BECONASE AQ) 42 mcg (0.042 %) nasal spray Place 2 sprays into each nostril two (2) times a day. 25 g 3    betamethasone, augmented, (DIPROLENE) 0.05 % ointment Apply twice daily to active areas until smooth. 100 g 5    cetirizine (ZYRTEC) 10 MG tablet Take 1 tablet (10 mg total) by mouth daily. 30 tablet 11    CREON 24,000-76,000 -120,000 unit CpDR delayed release capsule TAKE 1 CAPSULE BY MOUTH THREE TIMES DAILY WITH A MEAL 90 capsule 9    dupilumab 300 mg/2 mL PnIj Inject the contents of 1 pen (300 mg total) under the skin every seven (7) days. 8 mL 5    empty container Misc Use as directed to dispose of Dupixent pens. 1 each 2    enalapril (VASOTEC) 20 MG tablet TAKE ONE TABLET BY MOUTH EVERY DAY 90 tablet 2    gabapentin (NEURONTIN) 300 MG capsule TAKE THREE CAPSULES BY MOUTH TWICE DAILY 270 capsule 1    meclizine (ANTIVERT) 25 mg tablet Take 1 tablet (25 mg total) by mouth Three (3) times a day as needed for dizziness.      mirtazapine (REMERON) 30 MG tablet TAKE ONE TABLET BY MOUTH EVERY DAY 30 tablet 9    pantoprazole (PROTONIX) 20 MG tablet TAKE ONE TABLET BY MOUTH EVERY DAY 90 tablet 3    PREZCOBIX 800-150 mg-mg tablet TAKE ONE TABLET BY MOUTH DAILY 30 tablet 9    TIVICAY 50 mg TABLET TAKE ONE TABLET BY MOUTH DAILY 30 tablet 9    atorvastatin (LIPITOR) 20 MG tablet Take 1 tablet (20 mg total) by mouth daily. 30 tablet 5    halobetasol (ULTRAVATE) 0.05 % ointment APPLY TO AFFECTED AREA TWICE A DAY AS NEEDED AVOID FACE AND FOLDS (Patient not taking: Reported on 09/09/2022) 30 g 1    hydrOXYzine (ATARAX) 25 MG tablet Take 1 tablet (25 mg total) by mouth at bedtime. Prn pruritus (Patient not taking: Reported on 05/30/2022) 30 tablet 0    pioglitazone (ACTOS) 15 MG tablet Take 1 tablet (15 mg total) by mouth daily. (Patient not taking: Reported on 09/09/2022)      sildenafil (VIAGRA) 50 MG tablet Take 1 tablet (50 mg total) by mouth daily as needed for erectile dysfunction. 10 tablet 0    triamcinolone (KENALOG) 0.1 % ointment Apply to itchy spots 2 times daily. For elbows at night time, apply medicine and then cover with saran wrap. (Patient not taking: Reported on 05/30/2022) 454 g 0     Current Facility-Administered Medications   Medication Dose Route  Frequency Provider Last Rate Last Admin    triamcinolone acetonide (KENALOG-40) injection 40 mg  40 mg Intra-articular Once Evaristo Bury, PA         PE:  A&A. NAD. Chest CTA, Heart RRR. EXT w/o edema    DATA:    Results for orders placed or performed during the hospital encounter of 08/07/22   Spirometry Pre / Post Bronchodilator   Result Value Ref Range    FVC POST 3.79 2.62 - 4.34 L    FEV1 POST 2.95 2.08 - 3.46 L    FEV.75 POST 2.74 L    FEV1/FVC POST 77.85 67.88 - 89.66 %    FEF25-75% POST 2.49 1.29 - 4.28 L/s    ISOFEF25-75 POST 2.52 L/s    FIVC POST 2.71 2.62 - 4.34 L    FEF50% POST 3.52 1.87 - 6.22 L/s    FIF50% POST 4.89 L/s    PEF POST 10.13 (H) 4.90 - 9.34 L/s    PIF POST 4.95 3.66 - 10.78 L/s    FET POST 6.66 sec    FET100% POST 6.62 sec    ZOX/WRU04 post 71.98 %    EOTT POST 6.66 sec    VOL extrap post 0.11 L    Grade FVC A19 POST 1.00     Grade FEV1 A19 POST 1.00     FEV1Q POST 5.90      FEV1Q PRE 5.61      FVC PRE 3.78 2.62 - 4.34 L    FEV1 PRE 2.80 2.08 - 3.46 L    FEV.75 PRE 2.58 L    FEV1/FVC PRE 74.15 67.88 - 89.66 %    FEF25-75% PRE 2.03 1.29 - 4.28 L/s    ISOFEF25-75 PRE 2.03 L/s    FIVC PRE 3.63 2.62 - 4.34 L    FEF50% PRE 2.78 1.87 - 6.22 L/s    FIF50% PRED 4.48 L/s    PEF PRE 9.24 4.90 - 9.34 L/s    PIF PRE 4.93 3.66 - 10.78 L/s    FET PRE 6.45 sec    FET100% Change 6.41 sec    VWU/JWJ19 pre 61.99 %    EOTT PRE 6.45 sec    Vol extrap pre 0.11 L    Grade FVC A19 PRE 1.00     Grade FEV1 A19 PRE 1.00     FVC LLN 2.62     FVC Predicted 3.47     FVC PreZ-Score 0.59     FVC % Predicted PRE 109 % %    FVC PostZ-Score 0.60     FVC % Predicted POST 109 % %    FVC % Change 0 % %    FEV1 LLN 2.08     FEV1 Predicted 2.79     FEV1 PreZ-Score 0.04     FEV1 % Predicted PRE 101 % %    FEV1 PostZ-Score 0.38     FEV1 % Predicted POST 106 % %    FEV1 % Change 5 % %    FEV1/FVC LLN 68     FEV1/FVC Predicted 80     FEV1/FVC PreZ-Score -0.83     FEV1/FVC % Predicted PRE 93 % %    FEV1/FVC PostZ-Score -0.30     FEV1/FVC % Predicted POST 97 % %    FEV1/FVC % Change 5 % %    FEF25-75% LLN 1.29     FEF25-75% Predicted 2.57     FEF25-75% PreZ-Score -0.62     FEF25-75% % Predicted  PRE 79 % %    FEF25-75% PostZ-Score -0.09     FEF25-75% % Predicted POST 97 % %    FEF 25-75% % Change 18 % %    FVCIN LLN 2.62     FIVC Predicted 3.47     FVCIN PreZ-Score 0.30     FIVC % Predicted PRE 105 % %    FVCIN PostZ-Score -1.46     FIVC % Predicted POST 78 % %    FIVC % Change -26 % %    FEF50% LLN 1.87     FEF50% Predicted 4.04     FEF50% PreZ-Score -0.96     FEF50% % Predicted PRE 69 % %    FEF50% PostZ-Score -0.40     FEF50% % Predicted POST 87 % %    FEF50% % Change 18 % %    PEF LLN 4.90     PEF Predicted 7.12     PEF PreZ-Score 1.57     PEF % Predicted PRE 130 % %    PEF PostZ-Score 2.23     PEF % Predicted POST 142 % %    PEF % Change 12 % %    PIF LLN 3.66     PIF Predicted 7.22     PIF PreZ-Score -0.09     PIF % Predicted PRE 68 % %    PIF PostZ-Score -0.09     PIF % Predicted POST 68 % %    PIF % Change 0 % %    FVC PreZ-Score 0.59     FEV1 PreZ-Score 0.04     FEV1/FVC PreZ-Score -1     FVC PostZ-Score 0.60     FEV1 PostZ-Score 0.38     FEV1/FVC PostZ-Score -0     FVC %CHANGE -4.5 %    FEV1 %CHANGE 79.2 %     *Note: Due to a large number of results and/or encounters for the requested time period, some results have not been displayed. A complete set of results can be found in Results Review.        Creatinine   Date Value Ref Range Status   03/18/2022 0.73 0.60 - 1.10 mg/dL Final   16/04/9603 5.40 0.60 - 1.10 mg/dL Final   98/05/9146 8.29 0.60 - 1.10 mg/dL Final   56/21/3086 5.78 0.60 - 1.10 mg/dL Final   46/96/2952 8.41 0.76 - 1.27 mg/dL Final   32/44/0102 7.25 0.70 - 1.30 mg/dL Final   36/64/4034 7.42 0.70 - 1.30 mg/dL Final   59/56/3875 6.43 0.70 - 1.30 mg/dL Final       Triglycerides   Date Value Ref Range Status   06/18/2021 163 (H) 0 - 150 mg/dL Final   32/95/1884 166 1 - 149 mg/dL Final     HDL   Date Value Ref Range Status   06/18/2021 42 40 - 60 mg/dL Final   12/29/1599 79 (H) 40 - 59 mg/dL Final     LDL Calculated   Date Value Ref Range Status   06/18/2021 115 (H) 40 - 99 mg/dL Final     Comment:     NHLBI Recommended Ranges, LDL Cholesterol, for Adults (20+yrs) (ATPIII), mg/dL  Optimal              <093  Near Optimal        100-129  Borderline High     130-159  High                160-189  Very High            >=  190  NHLBI Recommended Ranges, LDL Cholesterol, for Children (2-19 yrs), mg/dL  Desirable            <161  Borderline High     110-129  High                 >=130       LDL Cholesterol, Calculated   Date Value Ref Range Status   02/15/2013 100 mg/dL Final     Comment:     :  ADULTS (20 years or older)  Optimal         <100  Near Optimal    100-129  Borderline High 130-159  High            160-189  Very High       >=190  CHILDREN (2-19 years)  Desirable       <110  Borderline High 110-129  High            >/= 130       IMAGING STUDIES:  None

## 2022-09-09 NOTE — Unmapped (Signed)
Name: Timothy Tapia  Date: 09/09/2022  Address: [address]  [place] [address]   [place] of Residence:  [place]  Phone: [phone]     Started assessment with patient options: in clinic     Is this the same address for mailing? Yes  If No, Mailing Address is:     Housing Financial controller and Medicare Part C    Tax Filing Status  I did not file taxes     Employment Status  Employed Part Time     Income  Salary/Wages and Social Security (Retirement/Survivor's/Disability)    If no or low income, how are you meeting your basic needs?  Food Stamps/EBT    List Tax Household Members including relationship to you:   N/a    Someone in my household receives: Not Applicable (for household members)  Specify who: n/a    Medication Access/Barriers: none    Do you have a current diagnosis for Hepatitis C?  Lab Results   Component Value Date    HEPCAB Nonreactive 03/23/2019       Have you used tobacco products four or more times per week in the last six months?  Yes    Teacher, adult education  Patient has affordable insurance through [employer], [place], and or Employment and is not eligible.    Patient given ACA education if they qualified based on answers to questions above.     MyChart  Do you have an active MyChart account? Yes     If MyChart is not set up, informed patient on how to set up MyChart N/A    Patient was informed of the following programs;   N/A    The following applications/handouts were given to patient:   N/A    The following forms were also started with the patient:   N/A    Juanell Fairly application status: Incomplete; patient needs to send 2 check stubs 2023 or 2024 ssi letter    Patient is applying for [employer] on Charges Only     Additional Comments: No issues accessing meds. Informed patient about [employer] issue. Will message patient the [facility] number to contact to see if their Medicaid will cover their appts after April 1st. Patient will bring income in.      Rhetta Mura  ID Clinic Benefits Counselor  Time of Intervention-42mins

## 2022-09-11 ENCOUNTER — Encounter: Admit: 2022-09-11 | Discharge: 2022-09-11 | Payer: MEDICARE

## 2022-09-11 ENCOUNTER — Ambulatory Visit: Admit: 2022-09-11 | Discharge: 2022-09-11 | Payer: MEDICARE

## 2022-09-11 MED ORDER — SENNOSIDES 8.6 MG TABLET
ORAL_TABLET | Freq: Every day | ORAL | 0 refills | 7.00000 days | Status: CP
Start: 2022-09-11 — End: 2022-09-18
  Filled 2022-09-11: qty 7, 7d supply, fill #0

## 2022-09-11 MED ORDER — DOCUSATE SODIUM 100 MG CAPSULE
ORAL_CAPSULE | Freq: Every day | ORAL | 0 refills | 7.00000 days | Status: CP
Start: 2022-09-11 — End: 2022-09-18
  Filled 2022-09-11: qty 7, 7d supply, fill #0

## 2022-09-11 MED ORDER — ACETAMINOPHEN 500 MG TABLET
ORAL_TABLET | Freq: Three times a day (TID) | ORAL | 0 refills | 7.00000 days | Status: CP
Start: 2022-09-11 — End: 2022-09-18
  Filled 2022-09-11: qty 42, 7d supply, fill #0

## 2022-09-11 MED ORDER — OXYCODONE 5 MG TABLET
ORAL_TABLET | ORAL | 0 refills | 2.00000 days | Status: CP | PRN
Start: 2022-09-11 — End: 2022-09-16
  Filled 2022-09-11: qty 10, 2d supply, fill #0

## 2022-09-11 MED ADMIN — Propofol (DIPRIVAN) injection: INTRAVENOUS | @ 15:00:00 | Stop: 2022-09-11

## 2022-09-11 MED ADMIN — midazolam (VERSED) injection: INTRAVENOUS | @ 15:00:00 | Stop: 2022-09-11

## 2022-09-11 MED ADMIN — lidocaine (XYLOCAINE) 20 mg/mL (2 %) injection: INTRAVENOUS | @ 15:00:00 | Stop: 2022-09-11

## 2022-09-11 MED ADMIN — ondansetron (ZOFRAN) injection: INTRAVENOUS | @ 16:00:00 | Stop: 2022-09-11

## 2022-09-11 MED ADMIN — ceFAZolin (ANCEF) IVPB 2 g in 50 ml dextrose (premix): 2 g | INTRAVENOUS | @ 15:00:00 | Stop: 2022-09-11

## 2022-09-11 MED ADMIN — fentaNYL (PF) (SUBLIMAZE) injection: INTRAVENOUS | @ 15:00:00 | Stop: 2022-09-11

## 2022-09-11 MED ADMIN — bupivacaine (PF) (MARCAINE) 0.5 % (5 mg/mL) injection (PF): @ 16:00:00 | Stop: 2022-09-11

## 2022-09-11 MED ADMIN — dexAMETHasone (DECADRON) 4 mg/mL injection: INTRAVENOUS | @ 15:00:00 | Stop: 2022-09-11

## 2022-09-11 MED ADMIN — sodium chloride irrigation (NS) 0.9 % irrigation solution: @ 16:00:00 | Stop: 2022-09-11

## 2022-09-11 MED ADMIN — lactated Ringers infusion: 10 mL/h | INTRAVENOUS | @ 15:00:00 | Stop: 2022-09-11

## 2022-09-11 MED ADMIN — fentaNYL (PF) (SUBLIMAZE) injection: INTRAVENOUS | @ 16:00:00 | Stop: 2022-09-11

## 2022-09-11 NOTE — Unmapped (Signed)
Brief Pre-operative History & Physical    Patient name: Timothy Tapia  CSN: [id number]  MRN: [id number]  Admit Date: 09/11/2022  Date of Surgery: 09/11/2022  Performing Service: Urology    Code Status: Prior      Assessment/Plan:      Timothy Tapia is a 57 y.o. male with phimosis, who presents for:  Procedure(s) (LRB):  CIRCUMCISION, SURGICAL EXCISION OTHER THAN CLAMP, DEVICE OR DORSAL SLIT; OLDER THAN 14 DAYS OF AGE (N/A).     Consent obtained in office is accurate. Risks, benefits, and alternatives to surgery were reviewed, and all questions were answered.    Proceed to the OR as planned.         History of Present Illness:    Timothy Tapia is a 57 y.o. male with phimosis. He was recently seen in clinic, where a detailed HPI can be found. He was noted to benefit from:  Procedure(s) (LRB):  CIRCUMCISION, SURGICAL EXCISION OTHER THAN CLAMP, DEVICE OR DORSAL SLIT; OLDER THAN 50 DAYS OF AGE (N/A).       Allergies  Sulfa (sulfonamide antibiotics) and Sulfur    Medications    Current Facility-Administered Medications   Medication Dose Route Frequency Provider Last Rate Last Admin    ceFAZolin (ANCEF) IVPB 2 g in 50 ml dextrose (premix)  2 g Intravenous For OR use Abu-Salha, Yousef M, MD        lactated Ringers infusion  10 mL/hr Intravenous Continuous Abu-Salha, Kathalene Frames, MD           Vital Signs  BP 137/82  - Pulse 71  - Temp 36.8 ??C (98.2 ??F) (Temporal)  - Resp 20  - SpO2 100%   Facility age limit for growth %iles is 20 years.  Facility age limit for growth %iles is 20 years..     Physical Exam  General: Well developed, appears stated age, in no acute distress  Mental status: Alert and oriented x3  Cardiovascular: Normal  Pulmonary: Symmetric chest rise, unlabored breathing  Relevant System for Surgery: Surgical site examination deferred to the OR    Labs and Studies:  Lab Results   Component Value Date    WBC 8.3 04/11/2022    HGB 13.5 04/11/2022    HCT 40.0 04/11/2022    PLT 334 04/11/2022       Lab Results Component Value Date    PT 13.5 (H) 09/14/2015    INR 1.22 09/14/2015    APTT 30.4 07/06/2015

## 2022-09-11 NOTE — Unmapped (Addendum)
UROLOGY FOLLOW UP REQUEST    Requesting physician/contact for questions: Roby Lofts, MD      Date(s) Needed: 4 weeks    Appt to be scheduled under: APPs Margaretmary Lombard)     Type of Appt: Follow-up    Need to contact patient: Yes please    Special Requests/Instructions: None    Orders placed for Special Requests: N/A      Thank you so much for your help!    Best,  Chanetta Marshall

## 2022-09-11 NOTE — Unmapped (Signed)
Procedure Date:  09/11/22  Attending Physician: Lincoln Brigham, MD  Assistants: Len Blalock Coley Kulikowski     Preoperative Diagnosis: Phimosis      Postoperative Diagnosis: Same    Procedure Performed: Circumcision    Estimated Blood Loss: Minimal    Specimens: penile foreskin    Complications: None    Drains: None    Disposition: Stable to PACU  Findings: Phimosis, successful circumcision    Indication: 57 yo male with phimosis here for surgery  Procedure in Detail:   The patient underwent anesthesia and a penile block was performed.    The patient's foreskin was retracted.  A fine-tip pen was used to demarcate the incision site on the shaft of the penis.  A #15 blade was used to make a circumferential incision.  Bovie electrocautery was used to maintain hemostasis.  The  prepuce was then marked with a fine-tip pen circumferentially at the mid glans.  A #15 blade was used to make an incision on the skin circumferentially.  Hemostats were placed on the prepuce, and it was excised using sharp dissection. Hemostasis was maintained using Bovie electrocautery.     The Dartos was approximated with 5-0 Vicryl suture in a quadrant fashion.  The skin was closed with a running horizontal mattress fashion 5-0 chromic.  Sterile dressing was applied to the penis.  The patient was awakened in the operating room and brought to recovery in good condition.    Dr Earnest Conroy was present and scrubbed for the entirety of the case.

## 2022-09-11 NOTE — Unmapped (Signed)
Brief Operative Note  (CSN: [id number])      Date of Surgery: 09/11/2022    Pre-op Diagnosis: phimosis    Post-op Diagnosis: Phimosis [N47.1]  Uncircumcised male [Z78.9]    Procedure(s):  CIRCUMCISION, SURGICAL EXCISION OTHER THAN CLAMP, DEVICE OR DORSAL SLIT; OLDER THAN 43 DAYS OF AGE: [address]161 (CPT??)  Note: Revisions to procedures should be made in chart - see Procedures activity.    Performing Service: Urology  Surgeons and Role:     * Aundria Rud, Allegra Grana, MD - Primary     * Jennette Dubin Len Blalock, MD - Resident - Assisting    Assistant: None    Findings: uncomplicated circumcision 09/11/2022    Anesthesia: General    Estimated Blood Loss: 5 mL    Complications: None    Specimens:   ID Type Source Tests Collected by Time Destination   1 : Foreskin Tissue Foreskin SURGICAL PATHOLOGY EXAM Lytle Butte, MD 09/11/2022 1145        Implants: * No implants in log *    Surgeon Notes: I was present and scrubbed for the entire procedure    Lincoln Brigham, MD   Date: 09/11/2022  Time: 12:22 PM

## 2022-09-11 NOTE — Unmapped (Signed)
[facility] Refill Coordination Note    Specialty Medication(s) to be Shipped:   Inflammatory Disorders: Dupixent    Other medication(s) to be shipped: No additional medications requested for fill at this time     Timothy Tapia, DOB: [birth year]  Phone: [phone] (home) [phone] (work)      All above HIPAA information was verified with patient.     Was a Nurse, learning disability used for this call? No    Completed refill call assessment today to schedule patient's medication shipment from the [facility] [phone]).  All relevant notes have been reviewed.     Specialty medication(s) and dose(s) confirmed: Regimen is correct and unchanged.   Changes to medications: Carnie reports no changes at this time.  Changes to insurance: No  New side effects reported not previously addressed with a pharmacist or physician: None reported  Questions for the pharmacist: No    Confirmed patient received a Conservation officer, historic buildings and a Surveyor, mining with first shipment. The patient will receive a drug information handout for each medication shipped and additional FDA Medication Guides as required.       DISEASE/MEDICATION-SPECIFIC INFORMATION        For patients on injectable medications: Patient currently has 1 doses left.  Next injection is scheduled for 3/20.    SPECIALTY MEDICATION ADHERENCE     Medication Adherence    Specialty Medication: Dupixent 300mg /64ml  Patient is on additional specialty medications: No  Informant: patient              Were doses missed due to medication being on hold? No    Dupixent 300/2 mg/ml: 7 days of medicine on hand       REFERRAL TO PHARMACIST     Referral to the pharmacist: Not needed      [facility]     Shipping address confirmed in Epic.     Patient was notified of new phone menu : Yes: .    Delivery Scheduled: Yes, Expected medication delivery date: 09/18/2022.     Medication will be delivered via Same Day Courier to the prescription address in Epic WAM.    Timothy Tapia   [facility] Specialty Technician

## 2022-09-17 DIAGNOSIS — Z006 Encounter for examination for normal comparison and control in clinical research program: Principal | ICD-10-CM

## 2022-09-18 MED FILL — DUPIXENT 300 MG/2 ML SUBCUTANEOUS PEN INJECTOR: SUBCUTANEOUS | 28 days supply | Qty: 8 | Fill #3

## 2022-09-19 ENCOUNTER — Institutional Professional Consult (permissible substitution): Admit: 2022-09-19 | Discharge: 2022-09-20

## 2022-09-19 ENCOUNTER — Ambulatory Visit: Admit: 2022-09-19 | Discharge: 2022-09-20

## 2022-09-19 DIAGNOSIS — Z006 Encounter for examination for normal comparison and control in clinical research program: Principal | ICD-10-CM

## 2022-09-19 LAB — CARBOXYHEMOGLOBIN: CARBOXYHEMOGLOBIN: 4 % — ABNORMAL HIGH (ref <1.2)

## 2022-09-19 MED ORDER — STUDY DEPTH-001 DOXYCYCLINE/PLACEBO 100 MG CAPSULE
PACK | Freq: Two times a day (BID) | ORAL | 0 refills | 100 days | Status: CP
Start: 2022-09-19 — End: ?

## 2022-09-21 NOTE — Unmapped (Signed)
Responded to pt call.     Pt s/p circumcision earlier this month.     Wondering if able to put soap on incision. I stated this was fine, and can let water run over incision.     Roby Lofts, MD  PGY3 Urologic Surgery  [facility]

## 2022-09-23 NOTE — Unmapped (Incomplete)
Referral Services Note     Duration of Intervention: *** minutes    TYPE OF CONTACT: {Contact Method:78761}    ASSESSMENT: ***    INTERVENTION:  ***    PLAN:  ***

## 2022-09-28 MED ORDER — GABAPENTIN 300 MG CAPSULE
ORAL_CAPSULE | 2 refills | 0 days
Start: 2022-09-28 — End: ?

## 2022-09-30 MED ORDER — GABAPENTIN 300 MG CAPSULE
ORAL_CAPSULE | 2 refills | 0 days | Status: CP
Start: 2022-09-30 — End: ?

## 2022-09-30 NOTE — Unmapped (Signed)
Medication Requested:Gabapentin - Rx needs updating send to provider for clarification on written instructions,       Future Appointments   Date Time Provider Department Center   10/08/2022  1:15 PM Arletha Grippe, [place] [facility]   10/15/2022  2:30 PM Otis Dials, FNP [facility]     Per Provider Note: Gabapentin 900 mg at bedtime is helping he says. Will try to escalate to 900 mg BID slowly. Watch for daytime somnolence.     Standing order protocol requirements met?: Yes    Sent to: Provider for signing    Days Supply Given: TBD by provider   Number of Refills: TBD by provider

## 2022-10-01 NOTE — Unmapped (Signed)
Referral Services Note     Duration of Intervention: 5 minutes    TYPE OF CONTACT: Phone    ASSESSMENT: Pt called for an update on payment for a research study. Pt did not recall which study and stated "I did a questionnaire and a blood draw"     INTERVENTION:  SW messaged research regarding the status of payment for the study pt participated in     PLAN:  Research will look into what study pt participated in as pt cannot recall.       Pattricia Boss Lorana Maffeo, [employer]

## 2022-10-04 DIAGNOSIS — K863 Pseudocyst of pancreas: Principal | ICD-10-CM

## 2022-10-04 MED ORDER — PANTOPRAZOLE 20 MG TABLET,DELAYED RELEASE
ORAL_TABLET | Freq: Every day | ORAL | 3 refills | 90 days
Start: 2022-10-04 — End: ?

## 2022-10-04 MED ORDER — CREON 24,000-76,000-120,000 UNIT CAPSULE,DELAYED RELEASE
ORAL_CAPSULE | 9 refills | 0 days
Start: 2022-10-04 — End: ?

## 2022-10-04 NOTE — Unmapped (Signed)
Medication Requested: Creon, Pantoprazole      Future Appointments   Date Time Provider Department Center   10/08/2022  1:15 PM Arletha Grippe, [place] [facility]   10/15/2022  2:30 PM Otis Dials, FNP [facility]     Per Provider Note:   pantoprazole (PROTONIX) 20 MG tablet TAKE ONE TABLET BY MOUTH EVERY DAY     CREON 24,000-76,000 -120,000 unit CpDR delayed release capsule TAKE 1 CAPSULE BY MOUTH THREE TIMES DAILY WITH A MEAL       Standing order protocol requirements met?: No    Sent to: Provider for signing    Days Supply Given: TBD by provider  Number of Refills: TBD by provider

## 2022-10-07 MED ORDER — PANTOPRAZOLE 20 MG TABLET,DELAYED RELEASE
ORAL_TABLET | Freq: Every day | ORAL | 3 refills | 90 days | Status: CP
Start: 2022-10-07 — End: ?

## 2022-10-07 MED ORDER — CREON 24,000-76,000-120,000 UNIT CAPSULE,DELAYED RELEASE
ORAL_CAPSULE | Freq: Three times a day (TID) | ORAL | 9 refills | 30 days | Status: CP
Start: 2022-10-07 — End: ?

## 2022-10-08 ENCOUNTER — Ambulatory Visit
Admit: 2022-10-08 | Discharge: 2022-10-09 | Payer: MEDICARE | Attending: Physician Assistant | Primary: Physician Assistant

## 2022-10-08 DIAGNOSIS — L309 Dermatitis, unspecified: Principal | ICD-10-CM

## 2022-10-08 MED ORDER — ENALAPRIL MALEATE 20 MG TABLET
ORAL_TABLET | Freq: Every day | ORAL | 2 refills | 90 days | Status: CN
Start: 2022-10-08 — End: ?

## 2022-10-08 MED ORDER — ATORVASTATIN 20 MG TABLET
ORAL_TABLET | Freq: Every day | ORAL | 5 refills | 30 days | Status: CN
Start: 2022-10-08 — End: ?

## 2022-10-08 MED ORDER — CLOBETASOL 0.05 % TOPICAL OINTMENT
Freq: Two times a day (BID) | TOPICAL | 5 refills | 0.00000 days | Status: CP
Start: 2022-10-08 — End: 2023-10-08
  Filled 2022-10-08: qty 60, 30d supply, fill #0

## 2022-10-08 MED ORDER — DUPILUMAB 300 MG/2 ML SUBCUTANEOUS PEN INJECTOR
SUBCUTANEOUS | 5 refills | 28.00000 days | Status: CP
Start: 2022-10-08 — End: ?
  Filled 2022-10-17: qty 8, 28d supply, fill #0

## 2022-10-08 NOTE — Unmapped (Addendum)
Follow up call:  Returned patients call. I asked if he had been taking anything for his allergies and he said vision. I told him he should maybe try zyertec or another over the counter allergy medication. To just check with pharmacist to make sure it has not interactions with his other medications.  ----------------------------------------------------------------------------------------------------------------------------    ----- Message from Franki Cabot sent at 10/08/2022 12:49 PM EDT -----  Incoming Call  Caller: [name]  Best callback number: [phone]  Reason for call: Isn't feeling well and has itchy and watery eyes and wants to know if you all can call in a prescription        Thank you!

## 2022-10-08 NOTE — Unmapped (Deleted)
-----   Message from Franki Cabot sent at 10/08/2022 12:49 PM EDT -----  Incoming Call  Caller: [name]  Best callback number: [phone]  Reason for call: Isn't feeling well and has itchy and watery eyes and wants to know if you all can call in a prescription        Thank you!

## 2022-10-08 NOTE — Unmapped (Signed)
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 Biopsy 03/2020 with Epidermal acanthosis with focal spongiosis and prominent papillary dermal fibrosis.  - Began Dupixent on 05/2020 and has responded well; previously discussed opt of JAK inhibitor -- some literature suggests JAK inhibitor use in HIV patients to reduce inflammation; pt has hx of DVT so not a candidate for JAK inhibitor though  - Continue dupilumab 300 mg/2 mL PnIj; Inject 300 mg under the skin once a week. Refills sent today.   - Hold betamethasone 0.05% ointment BID to active areas until smooth. Reviewed side effects of topical corticosteroids including atrophy, striae and dyschromia; however, discussed the current light areas are PIH and secondary to eczema and not the medications. Refills sent today.   - Encouraged to apply Eucerin daily to moisturize  - START clobetasol (TEMOVATE) 0.05 % ointment; Apply topically two (2) times a day. To active areas until smooth  - cotton gloves given to patient to wear after applying clobetasol      The patient was advised to call for an appointment should any new, changing, or symptomatic lesions develop.     RTC: Return in about 6 months (around 04/09/2023). or sooner as needed   _________________________________________________________________      Chief Complaint     Chief Complaint   Patient presents with    Eczema     HANDS-FLARE       HPI     Timothy Tapia is a 57 y.o. male who presents as a returning patient (last seen by Me on 04/22/2022) to Dermatology for follow up of atopic dermatitis. At last visit, patient was to continue dupilumab and betamethasone oint for atopic dermatitis.     Today, patient reports flaring today on the hands with itching.  Continues to inject dupixent every week and tolerating well.  Also applying betamethasone with mild improvement.     The patient denies any other new or changing lesions or areas of concern.     Pertinent Past Medical History     No history of skin cancer    Family History:   Negative for melanoma    Past Medical History, Family History, Social History, Medication List, Allergies, and Problem List were reviewed in the rooming section of Epic.     ROS: Other than symptoms mentioned in the HPI, no fevers, chills, or other skin complaints    Physical Examination     GENERAL: Well-appearing male in no acute distress, resting comfortably.  NEURO: Alert and oriented, answers questions appropriately  PSYCH: Normal mood and affect  SKIN: Examination of the lower legs and hands was performed  - hyperpigmented papules and plaques of the dorsal hands and lower legs with lichenification      All areas not commented on are within normal limits or unremarkable      (Approved Template 03/13/2020)

## 2022-10-09 MED ORDER — ENALAPRIL MALEATE 20 MG TABLET
ORAL_TABLET | Freq: Every day | ORAL | 2 refills | 90 days | Status: CP
Start: 2022-10-09 — End: ?

## 2022-10-09 MED ORDER — ATORVASTATIN 20 MG TABLET
ORAL_TABLET | Freq: Every day | ORAL | 5 refills | 30 days | Status: CP
Start: 2022-10-09 — End: ?
  Filled 2022-10-08: qty 30, 30d supply, fill #1

## 2022-10-09 NOTE — Unmapped (Addendum)
Medication Requested: LIPITOR,(VASOTEC      Future Appointments   Date Time Provider Department Center   10/15/2022  2:30 PM Otis Dials, FNP [facility]     Per Provider Note: HTN: Controlled today. Will watch. On ACEi plus Ca++ bloocker. Lipitor on current med list    Standing order protocol requirements met?: Yes    Sent to: Pharmacy per protocol    Days Supply Given: 30 days  Number of Refills: 4

## 2022-10-11 NOTE — Unmapped (Signed)
Fax received from [facility] pharmacy requesting clarification on  pancrelipase, Lip-Prot-Amyl, (CREON) 24,000-76,000 -120,000 unit CpDR delayed release capsule. Sig: Take 1 cap PO TID. Take 2 caps with meals, 1 cap with snack.

## 2022-10-15 ENCOUNTER — Ambulatory Visit: Admit: 2022-10-15 | Discharge: 2022-10-16 | Payer: MEDICARE

## 2022-10-15 DIAGNOSIS — Z09 Encounter for follow-up examination after completed treatment for conditions other than malignant neoplasm: Principal | ICD-10-CM

## 2022-10-15 NOTE — Unmapped (Signed)
[facility] Refill Coordination Note    Specialty Medication(s) to be Shipped:   Inflammatory Disorders: Dupixent    Other medication(s) to be shipped: No additional medications requested for fill at this time     Timothy Tapia, DOB: 03-14-1966  Phone: [phone] (home) [phone] (work)      All above HIPAA information was verified with patient.     Was a Nurse, learning disability used for this call? No    Completed refill call assessment today to schedule patient's medication shipment from the [facility] [phone]).  All relevant notes have been reviewed.     Specialty medication(s) and dose(s) confirmed: Regimen is correct and unchanged.   Changes to medications: Timothy Tapia reports no changes at this time.  Changes to insurance: No  New side effects reported not previously addressed with a pharmacist or physician: None reported  Questions for the pharmacist: No    Confirmed patient received a Conservation officer, historic buildings and a Surveyor, mining with first shipment. The patient will receive a drug information handout for each medication shipped and additional FDA Medication Guides as required.       DISEASE/MEDICATION-SPECIFIC INFORMATION        For patients on injectable medications: Patient currently has 1 doses left.  Next injection is scheduled for 04/18.    SPECIALTY MEDICATION ADHERENCE     Medication Adherence    Patient reported X missed doses in the last month: 0  Specialty Medication: DUPIXENT PEN 300 mg/2 mL Pnij (dupilumab)  Patient is on additional specialty medications: No  Patient is on more than two specialty medications: No  Any gaps in refill history greater than 2 weeks in the last 3 months: no  Demonstrates understanding of importance of adherence: yes  Informant: patient  Reliability of informant: reliable  Provider-estimated medication adherence level: good  Patient is at risk for Non-Adherence: No  Reasons for non-adherence: no problems identified  Confirmed plan for next specialty medication refill: delivery by pharmacy  Refills needed for supportive medications: not needed          Refill Coordination    Has the Patients' Contact Information Changed: No  Is the Shipping Address Different: No         Were doses missed due to medication being on hold? No    Dupixent  300/2 mg/ml: 7 days of medicine on hand       REFERRAL TO PHARMACIST     Referral to the pharmacist: Not needed      [facility]     Shipping address confirmed in Epic.       Delivery Scheduled: Yes, Expected medication delivery date: 04/18.     Medication will be delivered via Same Day Courier to the prescription address in Epic WAM.    Antonietta Barcelona   [facility] Specialty Technician

## 2022-10-15 NOTE — Unmapped (Signed)
Assessment:  57 y.o. uncircumcised male with history of phimosis HIV, HTN, COPD, GERD, prior ETOH/drug abuse, prior DVT not on blood thinners.    Now s/p circumcision on 09/11/22.  Doing well.  Surgical incision healed.    Today he reports concern over ED since surgery.  We discussed the various etiologies of ED including neurogenic, vascular disease, psychogenic, etc and that some progressive loss of function is normal with increasing age. We discussed the treatment ladder approach ranging from observation, oral PDE5i meds. Given that he denies issues with low libido or fatigue, would defer androgen evaluation.  After consideration of therapeutic options, the patient has elected to continue use of sildenafil prescribed by PCP.     Lastly, given his age, offered PSA screening, he declines, mentions he will have this done with his PCP.    Plan:  Follow up with urology as needed.              Referring Physician:   Minerva Fester, MD  [address]  [facility]  [id number]  [place],  [place] [address]    PCP:   Minerva Fester, MD    Chief Complaint   Patient presents with    Post op       Subjective:    HPI:   57 y.o. male with history of HIV, HTN, COPD, GERD, prior ETOH/drug abuse, prior DVT not on blood thinners seen in consultation at the request of Minerva Fester, MD for evaluation. The patient has had concerns including Post op.    Mentions that he is having pain and difficulty retracting foreskin when erect, and has made sex painful/uncomfortable.  Reports intermittent episodes in which his prepuce tears due to tightness and has caused bleeding.   Denies episodes of balanitis.  Denies prior episodes of paraphimosis.    Mentions he is not on any blood thinners.  Does not have a cardiologist denies CAD.  Denies any prior issues with anesthesia.     Interval 10/15/22:  Now s/p circumcision on 09/11/22.  Doing well.   Denies hematuria, dysuria, fever/chills, nausea/vomiting, edema, discharge.    Today he mentions he is concerned about ED.  Mentions he noted this worsened since surgery.  Has difficulty maintaining an erection.  Denies changes in medication.  Is in a stable/safe relationship.    Mentions his PCP prescribed him sildenafil, has used once with notable improvement.           PMH:  Past Medical History:   Diagnosis Date    Acute pancreatitis     Barrett esophagus     Black stool 07/08/2017    Bronchitis     Colon polyp     Eczema     Gastroesophageal reflux disease     HIV disease (CMS-HCC)     Hypertension     Lack of access to transportation     Pancreatic pseudocyst     Thrombus     IMV thrombus r/t colitis & pancreatitis 10/07/13    Uncircumcised male 05/30/2022    Visual impairment        PSH:  Past Surgical History:   Procedure Laterality Date    esophogus surgery      PR CIRCUMCISION,OTHR N/A 09/11/2022    Procedure: CIRCUMCISION, SURGICAL EXCISION OTHER THAN CLAMP, DEVICE OR DORSAL SLIT; OLDER THAN 86 DAYS OF AGE;  Surgeon: Lytle Butte, MD;  Location: MAIN OR [facility];  Service: Urology    PR  COLONOSCOPY W/BIOPSY SINGLE/MULTIPLE  10/11/2013    Procedure: COLONOSCOPY, FLEXIBLE, PROXIMAL TO SPLENIC FLEXURE; WITH BIOPSY, SINGLE OR MULTIPLE;  Surgeon: Teodoro Spray, MD;  Location: [facility];  Service: Gastroenterology    PR COLONOSCOPY W/BIOPSY SINGLE/MULTIPLE N/A 09/29/2014    Procedure: COLONOSCOPY, FLEXIBLE, PROXIMAL TO SPLENIC FLEXURE; WITH BIOPSY, SINGLE OR MULTIPLE;  Surgeon: Bronson Curb, MD;  Location: GI PROCEDURES [facility];  Service: Gastroenterology    PR COLONOSCOPY W/BIOPSY SINGLE/MULTIPLE N/A 09/19/2015    Procedure: COLONOSCOPY, FLEXIBLE, PROXIMAL TO SPLENIC FLEXURE; WITH BIOPSY, SINGLE OR MULTIPLE;  Surgeon: Mayford Knife, MD;  Location: GI PROCEDURES [facility];  Service: Gastroenterology    PR COLSC FLX W/RMVL OF TUMOR POLYP LESION SNARE TQ N/A 09/19/2015    Procedure: COLONOSCOPY FLEX; W/REMOV TUMOR/LES BY SNARE;  Surgeon: Mayford Knife, MD;  Location: [facility]URES [facility];  Service: Gastroenterology    PR ENDOSCOPIC US EXAM, ESOPH N/A 08/10/2015    Procedure: UGI ENDOSCOPY; WITH ENDOSCOPIC ULTRASOUND EXAMINATION LIMITED TO THE ESOPHAGUS;  Surgeon: Vonda Antigua, MD;  Location: GI PROCEDURES [facility];  Service: Gastroenterology    PR UPGI ENDOSCOPY,FN NEEDLE BX,GUIDED N/A 09/22/2014    Procedure: UGI W/TRANSENDOSCOPIC US-GUIDE INTRA/TRANSMURAL NEEDLE ASP/BX (INCL EXAM ESOPHAGUS, STOMACH, DOUDENUM/JEJ);  Surgeon: Ned Grace, MD;  Location: GI PROCEDURES [facility];  Service: Gastroenterology    PR UPPER GI ENDOSCOPY,BIOPSY N/A 09/19/2015    Procedure: UGI ENDOSCOPY; WITH BIOPSY, SINGLE OR MULTIPLE;  Surgeon: Mayford Knife, MD;  Location: GI [facility]URES [facility];  Service: Gastroenterology    PR UPPER GI ENDOSCOPY,BIOPSY N/A 07/14/2017    Procedure: UGI ENDOSCOPY; WITH BIOPSY, SINGLE OR MULTIPLE;  Surgeon: Alfred Levins, MD;  Location: [facility]EDURES [facility];  Service: Gastroenterology       Medications:  Current Outpatient Medications   Medication Sig Dispense Refill    albuterol HFA 90 mcg/actuation inhaler Inhale 2 puffs by mouth prior to study CT scan 8.5 g 0    amLODIPine (NORVASC) 5 MG tablet TAKE ONE TABLET BY MOUTH DAILY 30 tablet 11    atorvastatin (LIPITOR) 20 MG tablet Take 1 tablet (20 mg total) by mouth daily. 30 tablet 5    beclomethasone (BECONASE AQ) 42 mcg (0.042 %) nasal spray Place 2 sprays into each nostril two (2) times a day. 25 g 3    betamethasone, augmented, (DIPROLENE) 0.05 % ointment Apply twice daily to active areas until smooth. 100 g 5    cetirizine (ZYRTEC) 10 MG tablet Take 1 tablet (10 mg total) by mouth daily. 30 tablet 11    dupilumab 300 mg/2 mL PnIj Inject the contents of 1 pen (300 mg total) under the skin every seven (7) days. 8 mL 5    empty container Misc Use as directed to dispose of Dupixent pens. 1 each 2    enalapril (VASOTEC) 20 MG tablet Take 1 tablet (20 mg total) by mouth daily. 90 tablet 2    gabapentin (NEURONTIN) 300 MG capsule TAKE THREE CAPSULES BY MOUTH TWICE DAILY 270 capsule 2    mirtazapine (REMERON) 30 MG tablet TAKE ONE TABLET BY MOUTH EVERY DAY 30 tablet 9    pancrelipase, Lip-Prot-Amyl, (CREON) 24,000-76,000 -120,000 unit CpDR delayed release capsule Take 1 capsule by mouth Three (3) times a day with a meal. Take 2 caps with meals, 1 cap with snacks 90 capsule 9    pantoprazole (PROTONIX) 20 MG tablet Take 1 tablet (20 mg total) by mouth daily. 90  tablet 3    PREZCOBIX 800-150 mg-mg tablet TAKE ONE TABLET BY MOUTH DAILY 30 tablet 9    STUDY DEPTH doxycycline/placebo 100 mg capsule Take 1 capsule (100 mg total) by mouth two (2) times a day. Drink a full glass of water with each dose.  Do not open capsules.  Swallow whole. 1 kit 0    TIVICAY 50 mg TABLET TAKE ONE TABLET BY MOUTH DAILY 30 tablet 9    clobetasol (TEMOVATE) 0.05 % ointment Apply topically two (2) times a day to active areas until smooth (Patient not taking: Reported on 10/15/2022) 60 g 5    meclizine (ANTIVERT) 25 mg tablet Take 1 tablet (25 mg total) by mouth Three (3) times a day as needed for dizziness. (Patient not taking: Reported on 10/15/2022)       Current Facility-Administered Medications   Medication Dose Route Frequency Provider Last Rate Last Admin    triamcinolone acetonide (KENALOG-40) injection 40 mg  40 mg Intra-articular Once Evaristo Bury, PA           Allergies:  Sulfa (sulfonamide antibiotics) and Sulfur     Social History: works at [employer].  Patient  reports that he has been smoking cigarettes. He started smoking about 29 years ago. He has a 7.3 pack-year smoking history. He has been exposed to tobacco smoke. He has never used smokeless tobacco. He reports that he does not currently use alcohol after a past usage of about 3.0 standard drinks of alcohol per week. He reports that he does not currently use drugs after having used the following drugs: Marijuana. Family History:  The patient's family history includes COPD in his father; Diabetes in his mother; Hypertension in his mother.     ROS:   A comprehensive 10-system review was negative, except as noted in HPI.    Physical Exam:  BP 112/68 (BP Site: R Arm, BP Position: Sitting, BP Cuff Size: X-Large)  - Pulse 71  - Temp 37.1 ??C (98.7 ??F) (Temporal)  - Wt 68.3 kg (150 lb 8 oz)  - BMI 25.54 kg/m??     General: well developed, well nourished, no acute distress  HEENT: PERLA, EOM intact, normocephalic, atraumatic  Chest: symmetrical  Lungs: non-labored breathing  Heart: normal rhythm, no JVD  Abdomen: no tenderness, no masses or hernias, no palpable organomegaly  GU: circumcised, surgical incision fully approximated, c/d/I. Testes descended bilaterally. No masses/edema.  Extremities: no deformities, no edema, no cyanosis

## 2022-10-16 ENCOUNTER — Ambulatory Visit: Admit: 2022-10-16 | Discharge: 2022-10-16

## 2022-10-16 ENCOUNTER — Institutional Professional Consult (permissible substitution): Admit: 2022-10-16 | Discharge: 2022-10-16

## 2022-10-16 DIAGNOSIS — Z006 Encounter for examination for normal comparison and control in clinical research program: Principal | ICD-10-CM

## 2022-10-21 NOTE — Unmapped (Signed)
10/16/2022 study lab results entered into lab flowsheet; DEPTH -- progress note

## 2022-10-24 DIAGNOSIS — K863 Pseudocyst of pancreas: Principal | ICD-10-CM

## 2022-10-24 MED ORDER — CREON 24,000-76,000-120,000 UNIT CAPSULE,DELAYED RELEASE
ORAL_CAPSULE | Freq: Three times a day (TID) | ORAL | 9 refills | 35 days
Start: 2022-10-24 — End: ?

## 2022-10-25 NOTE — Unmapped (Signed)
Patient completed RW paperwork. Patient is eligible for RW B&C grant services and Caps on Charges. IPL= 188%;FPL= 188%. 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 Makes $10/hr working 30 hours weekly.    Rhetta Mura  [facility] Clinic Benefits Counselor  Time of Intervention-27mins

## 2022-10-28 MED ORDER — CREON 24,000-76,000-120,000 UNIT CAPSULE,DELAYED RELEASE
ORAL_CAPSULE | Freq: Three times a day (TID) | ORAL | 9 refills | 35 days | Status: CP
Start: 2022-10-28 — End: ?

## 2022-10-28 MED ORDER — SILDENAFIL 50 MG TABLET
ORAL_TABLET | Freq: Every day | ORAL | 0 refills | 10 days | Status: CP | PRN
Start: 2022-10-28 — End: 2022-11-27
  Filled 2022-10-30: qty 10, 10d supply, fill #0

## 2022-10-28 NOTE — Unmapped (Addendum)
-----   Message from Daleen Snook sent at 10/28/2022 10:16 AM EDT -----  The PAC has received an incoming call requesting medication refill/clarification/question:    Caller: [name]  Best callback number: [phone]    Type of request (refill, clarification, question): refill  Name of medication: sildenafil (VIAGRA) 50 MG table  Is there a preferred amount requested (e.g. 30 pills, 3 months worth - if no leave blank):   Desired pharmacy:   [facility]  [address], [place] [address]  Phone: [phone]  Fax: [phone]        Does caller request a callback from a nurse to discuss?:     Returned patient phone call patient requesting a refill of Sildenafil ( Viagra)   Medication Requested: Sildenafil       No future appointments. Recall 12/13/2022  Per Provider Note:  Medication is not specifically mentioned on provider note. Medication is listed under Current medication list     Standing order protocol requirements met?: Yes    Sent to: Pharmacy per protocol    Days Supply Given: 30 days  ( Ten tablets )  Number of Refills: 0

## 2022-10-30 NOTE — Unmapped (Signed)
[facility] Refill Coordination Note    Specialty Medication(s) to be Shipped:   Inflammatory Disorders: Dupixent    Other medication(s) to be shipped: No additional medications requested for fill at this time     Timothy Tapia, DOB: 06/25/1966  Phone: [phone] (home) [phone] (work)      All above HIPAA information was verified with patient. Patient gave new address; updated in [place].    Was a translator used for this call? No    Completed refill call assessment today to schedule patient's medication shipment from the [facility] [phone]).  All relevant notes have been reviewed.     Specialty medication(s) and dose(s) confirmed: Regimen is correct and unchanged.   Changes to medications: Adony reports no changes at this time.  Changes to insurance: No  New side effects reported not previously addressed with a pharmacist or physician: None reported  Questions for the pharmacist: No    Confirmed patient received a Conservation officer, historic buildings and a Surveyor, mining with first shipment. The patient will receive a drug information handout for each medication shipped and additional FDA Medication Guides as required.       DISEASE/MEDICATION-SPECIFIC INFORMATION        For patients on injectable medications: Patient currently has 2 doses left.  Next injection is scheduled for 05/1.    SPECIALTY MEDICATION ADHERENCE     Medication Adherence    Patient reported X missed doses in the last month: 0  Specialty Medication: dupilumab 300 mg/2 mL PnIj  Patient is on additional specialty medications: No  Informant: patient                Were doses missed due to medication being on hold? No    Dupixent  300/2 mg/ml: 7 days of medicine on hand       REFERRAL TO PHARMACIST     Referral to the pharmacist: Not needed      [facility]     Shipping address confirmed in Epic.       Delivery Scheduled: Yes, Expected medication delivery date: 05/8.     Medication will be delivered via Same Day Courier to the prescription address in Epic WAM.    [name]   [facility] Specialty Technician

## 2022-11-04 ENCOUNTER — Ambulatory Visit (LOCAL_COMMUNITY_HEALTH_CENTER): Payer: Self-pay

## 2022-11-04 DIAGNOSIS — Z111 Encounter for screening for respiratory tuberculosis: Secondary | ICD-10-CM

## 2022-11-06 MED FILL — DUPIXENT 300 MG/2 ML SUBCUTANEOUS PEN INJECTOR: SUBCUTANEOUS | 28 days supply | Qty: 8 | Fill #1

## 2022-11-07 ENCOUNTER — Ambulatory Visit (LOCAL_COMMUNITY_HEALTH_CENTER): Payer: Self-pay

## 2022-11-07 DIAGNOSIS — Z111 Encounter for screening for respiratory tuberculosis: Secondary | ICD-10-CM

## 2022-11-07 LAB — TB SKIN TEST
Induration: 0 mm
TB Skin Test: NEGATIVE

## 2022-11-07 NOTE — Unmapped (Signed)
PA approved  for Dupixent 300MG /2ML pen-injectors  via CMM.  Key: [id number]

## 2022-11-11 MED ORDER — ATORVASTATIN 20 MG TABLET
ORAL_TABLET | Freq: Every day | ORAL | 5 refills | 30 days | Status: CP
Start: 2022-11-11 — End: ?

## 2022-11-11 NOTE — Unmapped (Addendum)
-----   Message from Cassie Freer sent at 11/11/2022  9:48 AM EDT -----  Regarding: has been out of meds for 2 days  The [facility] has received an incoming call requesting medication refill/clarification/question:    Caller: Cail Stokley  Best callback number:  [phone]    Type of request (refill, clarification, question): refill (has been out for 2 days)  Name of medication: Atorvostatin    Desired pharmacy:   [facility]  [address] [place], [place], [place] [address] [address]  [phone]                                                 RN Follow Up     Medication Requested: Atorvastatin      Future Appointments   Date Time Provider Department Center   12/23/2022  2:30 PM Minerva Fester, MD [facility]     Per Provider Note: On the patient's current med list    Standing order protocol requirements met?: Yes    Sent to: Pharmacy per protocol    Days Supply Given: 30 days  Number of Refills: 5

## 2022-11-13 MED ORDER — ENALAPRIL MALEATE 20 MG TABLET
ORAL_TABLET | Freq: Every day | ORAL | 0 refills | 90 days | Status: CP
Start: 2022-11-13 — End: ?

## 2022-11-13 MED ORDER — AMLODIPINE 5 MG TABLET
ORAL_TABLET | Freq: Every day | ORAL | 0 refills | 30 days | Status: CP
Start: 2022-11-13 — End: ?

## 2022-11-13 MED ORDER — PANTOPRAZOLE 20 MG TABLET,DELAYED RELEASE
ORAL_TABLET | Freq: Every day | ORAL | 0 refills | 90 days | Status: CP
Start: 2022-11-13 — End: ?

## 2022-11-13 MED ORDER — TIVICAY 50 MG TABLET
ORAL_TABLET | Freq: Every day | ORAL | 0 refills | 30 days | Status: CP
Start: 2022-11-13 — End: ?

## 2022-11-13 NOTE — Unmapped (Addendum)
Follow-up call:  Returned patients call. He wants all his prescriptions sent to [facility]. He says he is almost out of all of his meds. I told him I would send the request in.           --- Message from Daleen Snook sent at 11/13/2022 12:35 PM EDT -----  Incoming Call  Caller: [name]  Best callback number: [phone]  Reason for call: patient is having some issues with his medications.         Thank you!

## 2022-11-13 NOTE — Unmapped (Signed)
Medication Requested: amlodipine atorvastatin dolutegravir enalapril       Future Appointments   Date Time Provider Department Center   12/23/2022  2:30 PM Minerva Fester, MD [facility]     Per Provider Note: On current med list    Standing order protocol requirements met?: Yes    Sent to: Pharmacy per protocol    Days Supply Given: 30 days  Number of Refills: 0

## 2022-11-15 MED ORDER — MIRTAZAPINE 30 MG TABLET
ORAL_TABLET | Freq: Every day | ORAL | 0 refills | 30 days | Status: CP
Start: 2022-11-15 — End: ?

## 2022-11-15 MED ORDER — CETIRIZINE 10 MG TABLET
ORAL_TABLET | Freq: Every day | ORAL | 0 refills | 30 days | Status: CP
Start: 2022-11-15 — End: ?

## 2022-11-16 NOTE — Unmapped (Signed)
Timothy Tapia requested mirtazapine and cetirizine be refilled to [facility] pharmacy given a change in his insurance. He reports being stable on mirtazapine for several years, which is also documented in Dr. Annabell Sabal most recent note. Medications prescribed at his request.      Vergie Living, MD  Fellow, Infectious Diseases  [employer] [facility]

## 2022-11-21 DIAGNOSIS — B20 Human immunodeficiency virus [HIV] disease: Principal | ICD-10-CM

## 2022-11-21 MED ORDER — PREZCOBIX 800 MG-150 MG TABLET
ORAL_TABLET | 9 refills | 0 days
Start: 2022-11-21 — End: ?

## 2022-11-22 MED ORDER — TIVICAY 50 MG TABLET
ORAL_TABLET | Freq: Every day | ORAL | 1 refills | 30 days | Status: CP
Start: 2022-11-22 — End: ?

## 2022-11-22 MED ORDER — PREZCOBIX 800 MG-150 MG TABLET
ORAL_TABLET | 1 refills | 0 days | Status: CP
Start: 2022-11-22 — End: ?

## 2022-11-22 NOTE — Unmapped (Signed)
Medication Requested: Prezcobix + Dolutegravir         Future Appointments   Date Time Provider Department Center   12/23/2022  2:30 PM Minerva Fester, MD [facility]     Per Provider Note: On Prezcobix + Dolutegravir       Standing order protocol requirements met?: Yes    Sent to: Pharmacy per protocol    Days Supply Given: 30 days  Number of Refills: 1

## 2022-11-22 NOTE — Unmapped (Signed)
[facility] Refill Coordination Note    Specialty Medication(s) to be Shipped:   Inflammatory Disorders: Dupixent    Other medication(s) to be shipped: No additional medications requested for fill at this time     Timothy Tapia, DOB: 03-Jun-1966  Phone: [phone] (home) [phone] (work)      All above HIPAA information was verified with patient. Patient gave new address; updated in [place].    Was a translator used for this call? No    Completed refill call assessment today to schedule patient's medication shipment from the [facility] [phone]).  All relevant notes have been reviewed.     Specialty medication(s) and dose(s) confirmed: Regimen is correct and unchanged.   Changes to medications: Timothy Tapia reports no changes at this time.  Changes to insurance: No  New side effects reported not previously addressed with a pharmacist or physician: None reported  Questions for the pharmacist: No    Confirmed patient received a Conservation officer, historic buildings and a Surveyor, mining with first shipment. The patient will receive a drug information handout for each medication shipped and additional FDA Medication Guides as required.       DISEASE/MEDICATION-SPECIFIC INFORMATION        For patients on injectable medications: Patient currently has 1 doses left.  Next injection is scheduled for 05/30.    SPECIALTY MEDICATION ADHERENCE     Medication Adherence    Specialty Medication: dupilumab 300 mg/2 mL PnIj                Were doses missed due to medication being on hold? No    Dupixent  300/2 mg/ml: 6 days of medicine on hand       REFERRAL TO PHARMACIST     Referral to the pharmacist: Not needed      [facility]     Shipping address confirmed in Epic.       Delivery Scheduled: Yes, Expected medication delivery date: 05/30.     Medication will be delivered via Same Day Courier to the prescription address in Epic WAM.    Timothy Tapia   [facility] Specialty Technician

## 2022-11-22 NOTE — Unmapped (Addendum)
Follow-up Call:  Called pt and let him know he has enough refills through 01/2023. He also sees Dr.Wohl in June. Pt verbalized understanding.  ---------------------------------------------------------------------------------------------------------------------------------------    ---- Message from Daleen Snook sent at 11/22/2022  8:37 AM EDT -----  The PAC has received an incoming call requesting medication refill/clarification/question:    Caller: Timothy Tapia callback number: [phone]    Type of request (refill, clarification, question): refill  Name of medication: PREZCOBIX 800-150 mg-mg tablet  Is there a preferred amount requested (e.g. 30 pills, 3 months worth - if no leave blank):   Desired pharmacy:   [facility] [address], [place] - [address]  [phone] [address] [place] [address]  Phone: [phone] Fax: [phone]        Does caller request a callback from a nurse to discuss?:

## 2022-11-25 DIAGNOSIS — I10 Essential (primary) hypertension: Principal | ICD-10-CM

## 2022-11-25 DIAGNOSIS — K2270 Barrett's esophagus without dysplasia: Principal | ICD-10-CM

## 2022-11-25 MED ORDER — AMLODIPINE 5 MG TABLET
ORAL_TABLET | Freq: Every day | ORAL | 0 refills | 30 days | Status: CP
Start: 2022-11-25 — End: ?

## 2022-11-25 MED ORDER — PANTOPRAZOLE 20 MG TABLET,DELAYED RELEASE
ORAL_TABLET | Freq: Every day | ORAL | 0 refills | 90 days | Status: CP
Start: 2022-11-25 — End: ?

## 2022-11-28 MED FILL — DUPIXENT 300 MG/2 ML SUBCUTANEOUS PEN INJECTOR: SUBCUTANEOUS | 28 days supply | Qty: 8 | Fill #2

## 2022-11-28 NOTE — Unmapped (Addendum)
Follow up call 2-  Spoke with pt again to make sure he did not receive anything in April. Sometimes to med may come in 3 bottles. He said no. I will follow up with mail order pharmacy.    -----------------------------------------------------------------------------------------------------------------------------------------      Follow -up call:  Spoke with pharmacy and the reason they can not refill medicine is because patient had 90 day supply filled in April. [facility]. I will reach back out to patient.  ------------------------------------------------------------------------------------------------------------------------------------------    ---- Message from Cassie Freer sent at 11/26/2022  3:09 PM EDT -----  Regarding: help with refill  Incoming Call  Caller: [name]  Best callback number: [phone]  Reason for call: Patient called about his   pantoprazole (PROTONIX)     It was filled by his mail order on 4/16 (an insurance claim was processed, [facility] said), but he never received the medication. We refilled it to go to [facility] in [place] yesterday,  but they can't refill it because it was filled by the mail order.          Thank you!

## 2022-11-28 NOTE — Unmapped (Addendum)
Follow up call to Pharmacy:  [facility] pharmacy. They sent the medicine out. It was confirmed delievered on 10/23/2022 to the patients [address] address. I called patient back. He said he just received the creon. I gave him the number to [facility]. He would need to speak to them about specifics of the problem.  -------------------------------------------------------------------------------------------------------------------------------------      -- Message from Cassie Freer sent at 11/26/2022  3:09 PM EDT -----  Regarding: help with refill  Incoming Call  Caller: [name]  Best callback number: [phone]  Reason for call: Patient called about his   pantoprazole (PROTONIX)     It was filled by his mail order on 4/16 (an insurance claim was processed, [facility] said), but he never received the medication. We refilled it to go to [facility] in [place] yesterday,  but they can't refill it because it was filled by the mail order.          Thank you!

## 2022-12-02 NOTE — Unmapped (Signed)
Patient came as a walk in. Patient wanted a prescription for their dermatology medication. Informed patient to contact their Dermatology office directly.       Rhetta Mura  ID Clinic Benefits Counselor  Time of Intervention-6 mins

## 2022-12-05 MED ORDER — SILDENAFIL 50 MG TABLET
ORAL_TABLET | Freq: Every day | ORAL | 0 refills | 10 days | Status: CP | PRN
Start: 2022-12-05 — End: 2023-01-04

## 2022-12-05 NOTE — Unmapped (Signed)
Medication Requested: sildenafil       Future Appointments   Date Time Provider Department Center   12/23/2022  9:00 AM Servando Snare, Rolene Course, MD [facility]     Per Provider Note: on current med list    Standing order protocol requirements met?: No    Sent to: Provider for signing    Days Supply Given: 0  Number of Refills: 0

## 2022-12-06 NOTE — Unmapped (Addendum)
Follow-up Call:  Called patient back and let him know I sent the refill request to Dr. Servando Snare yesterday.  -------------------------------------------------------------------------------------------------------------------    ----- Message from Unk Lightning sent at 12/06/2022 11:19 AM EDT -----  Regarding: Rx Refill needed  Incoming Call  Caller: Jerline Pain  Best callback number: There are no phone numbers on [phone]  Reason for call: Pt is needing Rx Sildenafil refilled at [facility] on [address] [place]        Thank you!

## 2022-12-09 MED ORDER — AMLODIPINE 5 MG TABLET
ORAL_TABLET | Freq: Every day | ORAL | 0 refills | 0 days
Start: 2022-12-09 — End: ?

## 2022-12-11 MED ORDER — AMLODIPINE 5 MG TABLET
ORAL_TABLET | Freq: Every day | ORAL | 0 refills | 30 days | Status: CP
Start: 2022-12-11 — End: ?

## 2022-12-11 NOTE — Unmapped (Signed)
Medication Requested: amLODIPine       Future Appointments   Date Time Provider Department Center   12/23/2022  9:00 AM Servando Snare, Rolene Course, MD [facility]     Per Provider Note:     amLODIPine (NORVASC) 5 MG tablet TAKE ONE TABLET BY MOUTH DAILY       Standing order protocol requirements met?: Yes    Sent to: Pharmacy per protocol    Days Supply Given: 30 days  Number of Refills: 0

## 2022-12-16 DIAGNOSIS — L309 Dermatitis, unspecified: Principal | ICD-10-CM

## 2022-12-16 MED ORDER — CETIRIZINE 10 MG TABLET
ORAL_TABLET | Freq: Every day | ORAL | 0 refills | 30 days | Status: CP
Start: 2022-12-16 — End: ?

## 2022-12-16 MED ORDER — MIRTAZAPINE 30 MG TABLET
ORAL_TABLET | Freq: Every day | ORAL | 0 refills | 30 days | Status: CP
Start: 2022-12-16 — End: ?

## 2022-12-16 MED ORDER — CLOBETASOL 0.05 % TOPICAL OINTMENT
Freq: Two times a day (BID) | TOPICAL | 5 refills | 0.00000 days | Status: CP
Start: 2022-12-16 — End: 2023-12-16

## 2022-12-16 NOTE — Unmapped (Signed)
08/07/2022 study lab results entered into lab flowsheet; DEPTH, Screen -- progress note

## 2022-12-16 NOTE — Unmapped (Addendum)
Follow-up Call:  Called pt back he needs his zyrtec and mirtazapine refilled. He says he is out tonight and needs it ASAP. I told him I would send those pharmacy.  --------------------------------------------------------------------------------------------------------    ----- Message from Paulita Cradle sent at 12/16/2022  9:06 AM EDT -----  Incoming Call  Caller: [name]  Best callback number: [phone]  Reason for call: Patient needs to talk to a nurse about a medication         Thank you!

## 2022-12-16 NOTE — Unmapped (Signed)
Patient LVM on nurse line asking for larger quantity of clobetasol ointment since the one tube does not last the whole month .

## 2022-12-16 NOTE — Unmapped (Signed)
Medication Requested: mirtazapine , ZYRTEC      Future Appointments   Date Time Provider Department Center   12/23/2022  9:00 AM Servando Snare, Rolene Course, MD [facility]     Per Provider Note: On Remeron 30 mg at bedtime , on current med list    Standing order protocol requirements met?: Yes    Sent to: Pharmacy per protocol    Days Supply Given: 30 days  Number of Refills: 0

## 2022-12-18 NOTE — Unmapped (Signed)
Timothy Tapia,  Return TC to [name]. He reports itching areas to both arms present since yesterday and seem to be spreading. He was out in the yard this past week mowing grass and thinks it may be poison ivy or a reaction to the grass. If he's able to, he'll upload a photo later and he  will apply OTC anti-itch cream. Should he apply clobetasol to the areas?  Thanks,  Lafonda Mosses

## 2022-12-19 NOTE — Unmapped (Signed)
TC to Timothy Tapia.  Let him know he can apply clobetasol ointment to the active areas until smooth and to let us know if they do not improve over the next week. He will try to send a photo today.

## 2022-12-20 NOTE — Unmapped (Signed)
[facility] Refill Coordination Note    Specialty Medication(s) to be Shipped:   Inflammatory Disorders: Dupixent    Other medication(s) to be shipped: No additional medications requested for fill at this time     Timothy Tapia, DOB: 08-28-1965  Phone: [phone] (home) [phone] (work)      All above HIPAA information was verified with patient.     Was a Nurse, learning disability used for this call? No    Completed refill call assessment today to schedule patient's medication shipment from the [facility] [phone]).  All relevant notes have been reviewed.     Specialty medication(s) and dose(s) confirmed: Regimen is correct and unchanged.   Changes to medications: Ryley reports no changes at this time.  Changes to insurance: No  New side effects reported not previously addressed with a pharmacist or physician: None reported  Questions for the pharmacist: No    Confirmed patient received a Conservation officer, historic buildings and a Surveyor, mining with first shipment. The patient will receive a drug information handout for each medication shipped and additional FDA Medication Guides as required.       DISEASE/MEDICATION-SPECIFIC INFORMATION        For patients on injectable medications: Patient currently has 1 doses left.  Next injection is scheduled for 12/25/22.    SPECIALTY MEDICATION ADHERENCE              Were doses missed due to medication being on hold? No    DUPIXENT PEN 300 mg/2 mL Pnij (dupilumab)  : 7 days of medicine on hand       REFERRAL TO PHARMACIST     Referral to the pharmacist: Not needed      [facility]     Shipping address confirmed in Epic.       Delivery Scheduled: Yes, Expected medication delivery date: 12/26/22.     Medication will be delivered via Same Day Courier to the prescription address in Epic WAM.    Sebron Mcmahill' W Danae Chen Shared [facility] Specialty Technician

## 2022-12-26 MED FILL — DUPIXENT 300 MG/2 ML SUBCUTANEOUS PEN INJECTOR: SUBCUTANEOUS | 28 days supply | Qty: 8 | Fill #3

## 2023-01-01 DIAGNOSIS — B20 Human immunodeficiency virus [HIV] disease: Principal | ICD-10-CM

## 2023-01-01 MED ORDER — TIVICAY 50 MG TABLET
ORAL_TABLET | Freq: Every day | ORAL | 1 refills | 0 days
Start: 2023-01-01 — End: ?

## 2023-01-01 MED ORDER — PREZCOBIX 800 MG-150 MG TABLET
ORAL_TABLET | 1 refills | 0 days
Start: 2023-01-01 — End: ?

## 2023-01-01 NOTE — Unmapped (Signed)
Medication Requested: On Prezcobix + Dolutegravir       Future Appointments   Date Time Provider Department Center   01/06/2023  9:00 AM Servando Snare, Rolene Course, MD [facility]     Per Provider Note: On Prezcobix + Dolutegravir     Standing order protocol requirements met?: No    Sent to: Provider for signing    Days Supply Given: TBD by provider  Number of Refills: TBD by provider

## 2023-01-06 ENCOUNTER — Ambulatory Visit
Admit: 2023-01-06 | Discharge: 2023-01-07 | Payer: MEDICARE | Attending: Infectious Disease | Primary: Infectious Disease

## 2023-01-06 DIAGNOSIS — K863 Pseudocyst of pancreas: Principal | ICD-10-CM

## 2023-01-06 DIAGNOSIS — I10 Essential (primary) hypertension: Principal | ICD-10-CM

## 2023-01-06 DIAGNOSIS — L309 Dermatitis, unspecified: Principal | ICD-10-CM

## 2023-01-06 DIAGNOSIS — J449 Chronic obstructive pulmonary disease, unspecified: Principal | ICD-10-CM

## 2023-01-06 DIAGNOSIS — E119 Type 2 diabetes mellitus without complications: Principal | ICD-10-CM

## 2023-01-06 DIAGNOSIS — B20 Human immunodeficiency virus [HIV] disease: Principal | ICD-10-CM

## 2023-01-06 DIAGNOSIS — Z125 Encounter for screening for malignant neoplasm of prostate: Principal | ICD-10-CM

## 2023-01-06 LAB — HIV RNA, QUANTITATIVE, PCR
HIV RNA QNT RSLT: DETECTED — AB
HIV RNA: <20 {copies}/mL — ABNORMAL HIGH (ref <0)

## 2023-01-06 LAB — HEMOGLOBIN A1C
ESTIMATED AVERAGE GLUCOSE: 183 mg/dL
HEMOGLOBIN A1C: 8 % — ABNORMAL HIGH (ref 4.8–5.6)

## 2023-01-06 LAB — PSA: PROSTATE SPECIFIC ANTIGEN: 0.58 ng/mL (ref 0.00–4.00)

## 2023-01-06 MED ORDER — PREZCOBIX 800 MG-150 MG TABLET
ORAL_TABLET | Freq: Every day | ORAL | 9 refills | 30.00000 days | Status: CP
Start: 2023-01-06 — End: 2023-01-06

## 2023-01-06 MED ORDER — ENALAPRIL MALEATE 20 MG TABLET
ORAL_TABLET | Freq: Every day | ORAL | 0 refills | 90 days | Status: CN
Start: 2023-01-06 — End: ?

## 2023-01-06 MED ORDER — MECLIZINE 25 MG TABLET
ORAL_TABLET | Freq: Three times a day (TID) | ORAL | 0 refills | 10.00000 days | Status: CP | PRN
Start: 2023-01-06 — End: 2023-01-06
  Filled 2023-01-06: qty 30, 10d supply, fill #0

## 2023-01-06 MED ORDER — BECONASE AQ 42 MCG (0.042 %) NASAL SPRAY
Freq: Two times a day (BID) | NASAL | 0 refills | 30 days | Status: CP
Start: 2023-01-06 — End: 2023-01-06

## 2023-01-06 MED ORDER — TIVICAY 50 MG TABLET
ORAL_TABLET | Freq: Every day | ORAL | 9 refills | 30.00000 days | Status: CP
Start: 2023-01-06 — End: 2023-01-06

## 2023-01-06 MED ORDER — GABAPENTIN 300 MG CAPSULE
ORAL_CAPSULE | 2 refills | 0 days | Status: CP
Start: 2023-01-06 — End: 2023-01-06

## 2023-01-06 MED ORDER — ATORVASTATIN 20 MG TABLET
ORAL_TABLET | Freq: Every day | ORAL | 9 refills | 30 days | Status: CP
Start: 2023-01-06 — End: 2023-01-06

## 2023-01-06 MED ORDER — SILDENAFIL 50 MG TABLET
ORAL_TABLET | Freq: Every day | ORAL | 0 refills | 10 days | Status: CP | PRN
Start: 2023-01-06 — End: 2023-02-05
  Filled 2023-01-06: qty 10, 10d supply, fill #0

## 2023-01-06 MED ORDER — MIRTAZAPINE 30 MG TABLET
ORAL_TABLET | Freq: Every day | ORAL | 9 refills | 30.00000 days | Status: CP
Start: 2023-01-06 — End: 2023-01-06

## 2023-01-06 MED ORDER — BETAMETHASONE, AUGMENTED 0.05 % TOPICAL OINTMENT
0 refills | 0.00000 days | Status: CP
Start: 2023-01-06 — End: 2023-01-06

## 2023-01-06 MED ORDER — AMLODIPINE 5 MG TABLET
ORAL_TABLET | Freq: Every day | ORAL | 9 refills | 30.00000 days | Status: CP
Start: 2023-01-06 — End: 2023-01-06

## 2023-01-06 MED ORDER — CETIRIZINE 10 MG TABLET
ORAL_TABLET | Freq: Every day | ORAL | 9 refills | 30.00000 days | Status: CP
Start: 2023-01-06 — End: 2023-01-06

## 2023-01-06 MED ORDER — CREON 24,000-76,000-120,000 UNIT CAPSULE,DELAYED RELEASE
ORAL_CAPSULE | Freq: Three times a day (TID) | ORAL | 9 refills | 35.00000 days | Status: CP
Start: 2023-01-06 — End: 2023-01-06

## 2023-01-06 NOTE — Unmapped (Signed)
Medication Requested: Viagra      Future Appointments   Date Time Provider Department Center   01/06/2023  9:00 AM Minerva Fester, MD [facility]     Per Provider Note: Not mentioned in March note.     Standing order protocol requirements met?: No    Sent to: Provider for signing    Days Supply Given: 30 days  Number of Refills: 0

## 2023-01-06 NOTE — Unmapped (Signed)
PCP:  Minerva Fester, MD    01/06/2023    This is a RETURN visit for this 57 y.o. male with the following diagnoses:    Patient Active Problem List    Diagnosis Date Noted    Human immunodeficiency virus (HIV) disease (CMS-HCC) 02/04/1995    Phimosis 05/30/2022    Uncircumcised male 05/30/2022    Chronic bilateral low back pain 11/09/2019    COPD (chronic obstructive pulmonary disease) (CMS-HCC) 05/27/2017    Hypokalemia 05/27/2017    GERD (gastroesophageal reflux disease) 05/27/2017    Pancreatic pseudocyst 07/06/2015    GI bleeding 07/06/2015    ETOH abuse 07/06/2015    DVT (deep venous thrombosis), IMV thrombus 10/07/13 provoked per hematology (42mo therapy) 10/25/2013    Colitis 10/12/2013    Pancreatitis 02/15/2013    Tobacco use disorder 02/15/2013    HTN (hypertension) 02/15/2013    Barrett esophagus 02/15/2013    Asthma 02/15/2013    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  Swears he never misses a dose.   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  Is seen [facility]   Intraarticular corticosteroids had helped but no longer.  Now with neck and right shoulder pain.   Gabapentin 900 mg at bedtime is helping he says. Will try to escalate to 900 mg BID slowly. Watch for daytime somnolence.   Check MRI cervical spine  May need Neurosurgery consult  Spine Clinic involved    Hx Pancreatitis - Now quiescent   Old MRI shown that prior pancreatic pseudocyst resolved  On Creon     Barrett's Espohagitis  On PPI  Has some dysphagia  Will ask GI to see and consider repeat EGD given Barretts and symptoms     Smoking   Smoking still about 1/3 ppd  Says trying to stop     Mental Health  See above  On Remeron 30 mg at bedtime      Health Maintenance   Immunizations:  HBV immune. HCV antibody positive but HCV viral load negative so not infected.   C19 fully vaccinated and boosted w bivalent.   Refuses flu shot.   Cancer Screening:   Colonoscopy in 2017. Multiple polyps. Repeat.   Barrett's so EGDs  PSA today  HTN: Controlled today. Will watch. On ACEi plus Ca++ blocker.   Derm: Dermatology seeing for Atopic like dermatitis. On Dupixent.   Sinus congestion: Beconase nasal spray - less interaction with Pezcobix.     Will follow  up in 10 months.       Chief Complaint: Routine HIV follow-up    HPI:  Mr. Timothy Tapia returns for a routine appointment. See detailed A/P.     HIV-wise: Mostly stable CD4 and HIV RNA. Reports good adherence to ART.      ROS:   No fever chills, sweats, nausea, vomiting, diarrhea, rash, headache, joint pain. Neg for epigastric pain. Pos for LBP, nasal stuffiness    All other systems are negative.    ALLERGIES:  Sulfa (sulfonamide antibiotics) and Sulfur    MEDICATIONS:  Current Outpatient Medications   Medication Sig Dispense Refill    albuterol HFA 90 mcg/actuation inhaler Inhale 2 puffs by mouth prior to study CT scan 8.5 g 0    clobetasol (TEMOVATE) 0.05 % ointment Apply topically two (2) times a day. To active areas until smooth 120 g 5    dupilumab 300 mg/2 mL PnIj Inject the contents of 1 pen (300 mg total) under the skin every seven (7) days. 8 mL 5    enalapril (VASOTEC) 20 MG tablet Take 1 tablet (20 mg total) by mouth daily. 90 tablet 0    pantoprazole (PROTONIX) 20 MG tablet Take 1 tablet (20 mg total) by mouth daily. 90 tablet 0    amlodipine (NORVASC) 5 MG tablet Take 1 tablet (5 mg total) by mouth daily. 30 tablet 0    atorvastatin (LIPITOR) 20 MG tablet Take 1 tablet (20 mg total) by mouth daily. 30 tablet 5    beclomethasone (BECONASE AQ) 42 mcg (0.042 %) nasal spray Place 2 sprays into each nostril two (2) times a day. 25 g 0    betamethasone, augmented, (DIPROLENE) 0.05 % ointment Apply twice daily to active areas until smooth. 100 g 0    cetirizine (ZYRTEC) 10 MG tablet Take 1 tablet (10 mg total) by mouth daily. 30 tablet 0    darunavir-cobicistat (PREZCOBIX) 800-150 mg-mg tablet Take 1 tablet by mouth daily. 30 tablet 1    dolutegravir (TIVICAY) 50 mg TABLET Take 1 tablet (50 mg total) by mouth daily. 30 tablet 1    empty container Misc Use as directed to dispose of Dupixent pens. 1 each 2    gabapentin (NEURONTIN) 300 MG capsule Take 3 tabs by mouth twice a day 270 capsule 2    meclizine (ANTIVERT) 25 mg tablet Take 1 tablet (25 mg total) by mouth Three (3) times a day as needed for dizziness. 30 tablet 0    mirtazapine (REMERON) 30 MG tablet Take 1 tablet (30 mg total) by mouth daily. 30 tablet 0    pancrelipase, Lip-Prot-Amyl, (CREON) 24,000-76,000 -120,000 unit CpDR delayed release capsule Take 2 capsules by mouth Three (3) times a day with a meal. Take 2 caps with meals, 1 cap with snacks 210 capsule 9    sildenafil (VIAGRA) 50 MG tablet Take 1 tablet (50 mg total) by mouth daily as needed for erectile dysfunction. 10 tablet 0     Current Facility-Administered Medications   Medication Dose Route Frequency Provider Last Rate Last Admin    triamcinolone acetonide (KENALOG-40) injection 40 mg  40 mg Intra-articular Once Evaristo Bury, PA         PE:  A&A. NAD. Chest CTA, Heart RRR. EXT w/o edema    DATA:    Results for orders placed or performed in visit on 10/16/22   CBC w/ Differential   Result Value Ref Range    WBC 7.0 10*9/L  RBC 4.61 10*12/L    HGB 13.3 g/dL    HCT 16.1 %    MCV 09.6 fL    MCH 28.9 pg    MCHC 33.4 g/dL    RDW 04.5 %    Platelet 369 10*9/L    Neutrophils % 59.0 %    Lymphocytes % 30.0 %    Monocytes % 8.0 %    Eosinophils % 2.0 %    Basophils % 1.0 %    Absolute Neutrophils 4.1 10*9/L    Absolute Lymphocytes 2.1 10*9/L    Absolute Monocytes 0.6 10*9/L    Absolute Eosinophils 0.2 10*9/L    Absolute Basophils 0.1 10*9/L    Immature Granulocytes 0     Immature Grans (Abs) 0.0    Hepatic Function Panel   Result Value Ref Range    Total Bilirubin 0.2 mg/dL    AST 14 U/L    ALT 16 U/L    Bilirubin, Direct 0.10 mg/dL    Alkaline Phosphatase 165 U/L    BUN 8 mg/dL    Creatinine 4.09 mg/dL    eGFR If Africn Am 811     CO2 20.0 mmol/L     *Note: Due to a large number of results and/or encounters for the requested time period, some results have not been displayed. A complete set of results can be found in Results Review.        Creatinine   Date Value Ref Range Status   10/16/2022 0.75 mg/dL Final   91/47/8295 6.21 mg/dL Final   30/86/5784 6.96 0.60 - 1.10 mg/dL Final   29/52/8413 2.44 0.60 - 1.10 mg/dL Final   07/03/7251 6.64 0.76 - 1.27 mg/dL Final   40/34/7425 9.56 0.70 - 1.30 mg/dL Final   38/75/6433 2.95 0.70 - 1.30 mg/dL Final   18/84/1660 6.30 0.70 - 1.30 mg/dL Final       Triglycerides   Date Value Ref Range Status   09/09/2022 72 0 - 150 mg/dL Final   16/07/930 355 1 - 149 mg/dL Final     HDL   Date Value Ref Range Status   09/09/2022 43 40 - 60 mg/dL Final   73/22/0254 79 (H) 40 - 59 mg/dL Final     LDL Calculated   Date Value Ref Range Status   09/09/2022 127 (H) 40 - 99 mg/dL Final     Comment:     NHLBI Recommended Ranges, LDL Cholesterol, for Adults (20+yrs) (ATPIII), mg/dL  Optimal              <270  Near Optimal        100-129  Borderline High     130-159  High 160-189  Very High            >=190  NHLBI Recommended Ranges, LDL Cholesterol, for Children (2-19 yrs), mg/dL  Desirable            <623  Borderline High     110-129  High                 >=130       LDL Cholesterol, Calculated   Date Value Ref Range Status   02/15/2013 100 mg/dL Final     Comment:     :  ADULTS (20 years or older)  Optimal         <100  Near Optimal    100-129  Borderline High 130-159  High  160-189  Very High       >=190  CHILDREN (2-19 years)  Desirable       <110  Borderline High 110-129  High            >/= 130       IMAGING STUDIES:  None

## 2023-01-07 DIAGNOSIS — L309 Dermatitis, unspecified: Principal | ICD-10-CM

## 2023-01-07 MED ORDER — FLUTICASONE PROPIONATE 50 MCG/ACTUATION NASAL SPRAY,SUSPENSION
9 refills | 0 days
Start: 2023-01-07 — End: ?

## 2023-01-15 NOTE — Unmapped (Addendum)
PA initiated for Clobetasol 0.05% ointment quantity limit request via CMM.  (Key: [id number])  PA Case ID #: [id number]    PA approved through 06/30/2023, waiting for faxed approval letter to be uploaded to the chart

## 2023-01-23 MED FILL — DUPIXENT 300 MG/2 ML SUBCUTANEOUS PEN INJECTOR: SUBCUTANEOUS | 28 days supply | Qty: 8 | Fill #4

## 2023-01-29 MED ORDER — ENALAPRIL MALEATE 20 MG TABLET
ORAL_TABLET | Freq: Every day | ORAL | 0 refills | 0 days
Start: 2023-01-29 — End: ?

## 2023-01-30 MED ORDER — ENALAPRIL MALEATE 20 MG TABLET
ORAL_TABLET | Freq: Every day | ORAL | 0 refills | 30 days | Status: CP
Start: 2023-01-30 — End: ?

## 2023-01-30 NOTE — Unmapped (Signed)
Medication Requested: enalapril       No future appointments.  Per Provider Note:     enalapril (VASOTEC) 20 MG tablet Take 1 tablet (20 mg total) by mouth daily.          Standing order protocol requirements met?: Yes    Sent to: Pharmacy per protocol    Days Supply Given: 30 days  Number of Refills: 0

## 2023-02-12 NOTE — Unmapped (Signed)
[facility] Refill Coordination Note    Specialty Medication(s) to be Shipped:   Inflammatory Disorders: Dupixent    Other medication(s) to be shipped: No additional medications requested for fill at this time     Timothy Tapia, DOB: 07/25/1965  Phone: [phone] (home) [phone] (work)      All above HIPAA information was verified with patient.     Was a Nurse, learning disability used for this call? No    Completed refill call assessment today to schedule patient's medication shipment from the [facility] [phone]).  All relevant notes have been reviewed.     Specialty medication(s) and dose(s) confirmed: Regimen is correct and unchanged.   Changes to medications: Cross reports no changes at this time.  Changes to insurance: No  New side effects reported not previously addressed with a pharmacist or physician: None reported  Questions for the pharmacist: No    Confirmed patient received a Conservation officer, historic buildings and a Surveyor, mining with first shipment. The patient will receive a drug information handout for each medication shipped and additional FDA Medication Guides as required.       DISEASE/MEDICATION-SPECIFIC INFORMATION        For patients on injectable medications: Patient currently has 2 doses left.  Next injection is scheduled for 02/13/23.    SPECIALTY MEDICATION ADHERENCE     Medication Adherence    Patient reported X missed doses in the last month: 0  Specialty Medication: dupilumab 300 mg/2 mL PnIj  Patient is on additional specialty medications: No  Informant: patient              Were doses missed due to medication being on hold? No    DUPIXENT PEN 300 mg/2 mL Pnij (dupilumab)  : 8 days of medicine on hand       REFERRAL TO PHARMACIST     Referral to the pharmacist: Not needed      [facility]     Shipping address confirmed in Epic.       Delivery Scheduled: Yes, Expected medication delivery date: 02/19/23.     Medication will be delivered via Same Day Courier to the prescription address in Epic WAM.    [name]   [facility] Specialty Technician

## 2023-02-18 NOTE — Unmapped (Signed)
Returned patient's phone call and attempted to complete their RW renewal, but patient states he prefers to be contacted next Tuesday, August 27 @ 10am. Benefits rescheduled the patient.        [name] Asper,  Benefits & Eligibility Coordinator  Time of Intervention: 2 minutes

## 2023-02-18 NOTE — Unmapped (Signed)
Attempted to reach patient for phone appointment to renew RW, but no answer. VM not set up.        [name] Asper,  Benefits & Eligibility Coordinator  Time of Intervention: 2 minutes

## 2023-02-19 MED FILL — DUPIXENT 300 MG/2 ML SUBCUTANEOUS PEN INJECTOR: SUBCUTANEOUS | 28 days supply | Qty: 8 | Fill #5

## 2023-02-23 MED ORDER — ENALAPRIL MALEATE 20 MG TABLET
ORAL_TABLET | Freq: Every day | ORAL | 0 refills | 0 days
Start: 2023-02-23 — End: ?

## 2023-02-24 MED ORDER — ENALAPRIL MALEATE 20 MG TABLET
ORAL_TABLET | Freq: Every day | ORAL | 0 refills | 30 days | Status: CP
Start: 2023-02-24 — End: ?

## 2023-02-24 NOTE — Unmapped (Signed)
Medication Requested: Enalapril 20 mg tab    Last visit: 01/06/2023    Future Appointments   Date Time Provider Department Center   02/25/2023 10:00 AM ID [place] BENEFITS COUNSELOR [facility]   03/31/2023  9:00 AM Servando Snare Rolene Course, MD [facility]     Per Provider Note:   HTN: Controlled today. Will watch. On ACEi plus Ca++ blocker.   Standing order protocol requirements met?: Yes    Sent to: Pharmacy per protocol    Days Supply Given: 30 days  Number of Refills: 0

## 2023-02-25 ENCOUNTER — Ambulatory Visit: Admit: 2023-02-25 | Discharge: 2023-02-26 | Payer: MEDICAID

## 2023-02-25 NOTE — Unmapped (Signed)
Name: Timothy Tapia  Date: 02/25/2023  Address: [address]  [place] [address]   [place] of Residence:  [facility]  Phone: [phone]     Started assessment with patient options: over the phone     Is this the same address for mailing? 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  Food Stamps/EBT    List Tax Household Members including relationship to you:   N/A    Someone in my household receives: Not Applicable (for household members)  Specify who: N/A    Medication Access/Barriers: none    Do you have a current diagnosis for Hepatitis C?  Lab Results   Component Value Date    HEPCAB Nonreactive 03/23/2019       Have you used tobacco products four or more times per week in the last six months?  Yes    Teacher, adult education  Patient has affordable insurance through [employer], [place], and or Employment and is not eligible.    Patient given ACA education if they qualified based on answers to questions above.     MyChart  Do you have an active MyChart account? Yes     If MyChart is not set up, informed patient on how to set up MyChart No    Patient was informed of the following programs;   N/A    The following applications/handouts were given to patient:   N/A    The following forms were also started with the patient:   N/A    Medicaid:  Approved      Juanell Fairly application status: Incomplete; patient needs to send 2 paystubs    Patient is applying for [employer] on Charges Only     Additional Comments: patient stated of bringing income docs to in clinic appt on 9/30.      Madinah Cathcart-Rowe  Benefits Counselor  Time of Intervention: 15 mins

## 2023-03-01 MED ORDER — CLOTRIMAZOLE 10 MG TROCHE
ORAL_TABLET | ORAL | 0 refills | 14 days | Status: CP
Start: 2023-03-01 — End: 2023-03-15

## 2023-03-02 NOTE — Unmapped (Signed)
Infectious Disease Telephone Encounter    Patient called discuss white material on his tongue and some dysphagia over the past day. Prior history of thrush. Patient recalls using clomitrazole troche with good relief of symptoms. Denies fever, chills, nausea, vomiting, or other systemic symptoms. Will prescribe 14d supply of clomitrazole troche 5 times a day.    Patient expressed understanding    ACTION ITEMS  Clomitrazole 10mg  troche 5 times a day for 14 day    Ardath Sax, MD PhD     [facility] Infectious Disease Fellow - PGY3  [address], [place], [place] [address]       [phone]

## 2023-03-12 NOTE — Unmapped (Addendum)
RN Follow Up call:  Called patient to follow up on tongue. He said it is much better and he has no concerns at this time.  __________________________________________________________        ----- Message from Clyde Canterbury, MD PhD sent at 03/11/2023  5:21 PM EDT -----  Sherald Barge,    I'm sorry - I'm on inpatient and haven't had the time to reach out to patient. I forwarded the message last week to Dr. Servando Snare since that's his patient - would you be able to reach out if him if there are any follow up questions?    Ardath Sax, MD PhD     [facility] Infectious Disease Fellow - PGY3     Pager: [phone]  ----- Message -----  From: Georgina Peer, RN  Sent: 03/06/2023   1:50 PM EDT  To: Waldron Labs, MD PhD      ----- Message -----  From: Cassie Freer  Sent: 03/06/2023   1:14 PM EDT  To: #    Incoming Call  Caller: [name]  Best callback number: [phone]  Reason for call: Patient called saying that he was prescribed prescription for his thrush. He said that if he drinks soda or alcohol then his tongue burns. He said the white spots are gone and he is still taking his meds. I told him to stop drinking that for now until he hears from [place].        Thank you!

## 2023-03-12 NOTE — Unmapped (Signed)
-----   Message from Clyde Canterbury, MD PhD sent at 03/11/2023  5:21 PM EDT -----  Sherald Barge,    I'm sorry - I'm on inpatient and haven't had the time to reach out to patient. I forwarded the message last week to Dr. Servando Snare since that's his patient - would you be able to reach out if him if there are any follow up questions?    Ardath Sax, MD PhD     [facility] Infectious Disease Fellow - PGY3     Pager: [phone]  ----- Message -----  From: Georgina Peer, RN  Sent: 03/06/2023   1:50 PM EDT  To: Waldron Labs, MD PhD      ----- Message -----  From: Cassie Freer  Sent: 03/06/2023   1:14 PM EDT  To: #    Incoming Call  Caller: [name]  Best callback number: [phone]  Reason for call: Patient called saying that he was prescribed prescription for his thrush. He said that if he drinks soda or alcohol then his tongue burns. He said the white spots are gone and he is still taking his meds. I told him to stop drinking that for now until he hears from [place].        Thank you!

## 2023-03-13 DIAGNOSIS — L309 Dermatitis, unspecified: Principal | ICD-10-CM

## 2023-03-13 MED ORDER — DUPIXENT 300 MG/2 ML SUBCUTANEOUS PEN INJECTOR
SUBCUTANEOUS | 5 refills | 28 days
Start: 2023-03-13 — End: ?

## 2023-03-14 MED ORDER — DUPIXENT 300 MG/2 ML SUBCUTANEOUS PEN INJECTOR
SUBCUTANEOUS | 5 refills | 28.00000 days | Status: CP
Start: 2023-03-14 — End: ?
  Filled 2023-03-26: qty 8, 28d supply, fill #0

## 2023-03-14 NOTE — Unmapped (Signed)
Called, left voicemail and sent MyChart message    Thanks  Toni Amend

## 2023-03-14 NOTE — Unmapped (Signed)
Refills requested for dupixent. Order pended below for review  LOV: 10/08/22 with no upcoming visit    Call center, please schedule appt. Pt was advised to follow up 77m, or around 04/09/23

## 2023-03-14 NOTE — Unmapped (Signed)
[facility] Refill Coordination Note    Specialty Medication(s) to be Shipped:   Inflammatory Disorders: Dupixent    Other medication(s) to be shipped: No additional medications requested for fill at this time     Quinston Janow, DOB: 1965-12-04  Phone: [phone] (home) [phone] (work)      All above HIPAA information was verified with patient.     Was a Nurse, learning disability used for this call? No    Completed refill call assessment today to schedule patient's medication shipment from the [facility] [phone]).  All relevant notes have been reviewed.     Specialty medication(s) and dose(s) confirmed: Regimen is correct and unchanged.   Changes to medications: Jamarquez reports no changes at this time.  Changes to insurance: No  New side effects reported not previously addressed with a pharmacist or physician: None reported  Questions for the pharmacist: No    Confirmed patient received a Conservation officer, historic buildings and a Surveyor, mining with first shipment. The patient will receive a drug information handout for each medication shipped and additional FDA Medication Guides as required.       DISEASE/MEDICATION-SPECIFIC INFORMATION        For patients on injectable medications: Patient currently has 1 doses left.  Next injection is scheduled for 03/20/23.    SPECIALTY MEDICATION ADHERENCE     Medication Adherence    Patient reported X missed doses in the last month: 0  Specialty Medication: DUPIXENT PEN 300 mg/2 mL  Patient is on additional specialty medications: No  Informant: patient              Were doses missed due to medication being on hold? No    DUPIXENT PEN 300 mg/2 mL Pnij (dupilumab)  : 6 days of medicine on hand       REFERRAL TO PHARMACIST     Referral to the pharmacist: Not needed      [facility]     Shipping address confirmed in Epic.       Delivery Scheduled: Yes, Expected medication delivery date: 03/20/23.     Medication will be delivered via Same Day Courier to the prescription address in Epic WAM.    [name]   [facility] Specialty Technician

## 2023-03-17 NOTE — Unmapped (Signed)
Medication Requested: Viagra      Future Appointments   Date Time Provider Department Center   04/14/2023 11:30 AM Servando Snare Rolene Course, MD [facility]     Per Provider Note: not noted on vist. No active order.    Standing order protocol requirements met?: no

## 2023-03-18 MED ORDER — ENALAPRIL MALEATE 20 MG TABLET
ORAL_TABLET | Freq: Every day | ORAL | 0 refills | 30 days | Status: CP
Start: 2023-03-18 — End: ?

## 2023-03-18 NOTE — Unmapped (Signed)
Medication Requested: Enalapril      Future Appointments   Date Time Provider Department Center   04/14/2023 11:30 AM Minerva Fester, MD [facility]     Per Provider Note: HTN: Controlled today. Will watch. On ACEi plus Ca++ blocker.     Standing order protocol requirements met?: Yes    Sent to: Pharmacy per protocol    Days Supply Given: 30 days  Number of Refills: 0

## 2023-03-20 MED ORDER — SILDENAFIL 100 MG TABLET
ORAL_TABLET | Freq: Once | ORAL | 5 refills | 0 days | Status: CP | PRN
Start: 2023-03-20 — End: ?

## 2023-03-27 DIAGNOSIS — K2270 Barrett's esophagus without dysplasia: Principal | ICD-10-CM

## 2023-03-27 MED ORDER — PANTOPRAZOLE 20 MG TABLET,DELAYED RELEASE
ORAL_TABLET | Freq: Every day | ORAL | 0 refills | 30 days | Status: CP
Start: 2023-03-27 — End: ?

## 2023-03-27 NOTE — Unmapped (Signed)
Medication Requested: pantoprazole      Future Appointments   Date Time Provider Department Center   04/14/2023 11:30 AM Minerva Fester, MD [facility]     Per Provider Note:     pantoprazole (PROTONIX) 20 MG tablet Take 1 tablet (20 mg total) by mouth daily.       Standing order protocol requirements met?: Yes    Sent to: Pharmacy per protocol    Days Supply Given: 30 days  Number of Refills: 0

## 2023-04-08 NOTE — Unmapped (Signed)
-----   Message from Nemacolin P sent at 04/07/2023  3:09 PM EDT -----  Incoming Call  Caller: [name]  Best callback number: [phone]  Reason for call: Patient wants to talk to someone about his dry mouth that he keeps getting at night        Thank you!

## 2023-04-12 NOTE — Unmapped (Signed)
[facility] Refill Coordination Note    Specialty Medication(s) to be Shipped:   Inflammatory Disorders: Dupixent    Other medication(s) to be shipped: No additional medications requested for fill at this time     Timothy Tapia, DOB: 05/08/1966  Phone: [phone] (home) [phone] (work)      All above HIPAA information was verified with patient.     Was a Nurse, learning disability used for this call? No    Completed refill call assessment today to schedule patient's medication shipment from the [facility] [phone]).  All relevant notes have been reviewed.     Specialty medication(s) and dose(s) confirmed: Regimen is correct and unchanged.   Changes to medications: Timothy Tapia reports no changes at this time.  Changes to insurance: No  New side effects reported not previously addressed with a pharmacist or physician: None reported  Questions for the pharmacist: No    Confirmed patient received a Conservation officer, historic buildings and a Surveyor, mining with first shipment. The patient will receive a drug information handout for each medication shipped and additional FDA Medication Guides as required.       DISEASE/MEDICATION-SPECIFIC INFORMATION        For patients on injectable medications: Patient currently has 1 doses left.  Next injection is scheduled for 04/17/23.    SPECIALTY MEDICATION ADHERENCE     Medication Adherence    Patient reported X missed doses in the last month: 0  Specialty Medication: dupilumab (DUPIXENT PEN) 300 mg/2 mL PnIj  Patient is on additional specialty medications: No  Informant: patient              Were doses missed due to medication being on hold? No    DUPIXENT PEN 300 mg/2 mL Pnij (dupilumab)  : 5 days of medicine on hand       REFERRAL TO PHARMACIST     Referral to the pharmacist: Not needed      [facility]     Shipping address confirmed in Epic.       Delivery Scheduled: Yes, Expected medication delivery date: 04/18/23.     Medication will be delivered via Same Day Courier to the prescription address in Epic WAM.    Timothy Tapia   [facility] Specialty Technician

## 2023-04-14 DIAGNOSIS — E119 Type 2 diabetes mellitus without complications: Principal | ICD-10-CM

## 2023-04-14 MED ORDER — ALBUTEROL SULFATE HFA 90 MCG/ACTUATION AEROSOL INHALER
0 refills | 0 days | Status: CP
Start: 2023-04-14 — End: ?

## 2023-04-14 NOTE — Unmapped (Incomplete)
Medication Requested: albuterol HFA 90 mcg/actuation inhaler     {Verify that visits pulled are office visits with provider, not nurse visits:75688}  Future Appointments   Date Time Provider Department Center   04/28/2023 10:00 AM Servando Snare, Rolene Course, MD [facility]     Per Provider Note:   {Copy and paste in requested medication listed in provider note UUVOZ:36644}  Standing order protocol requirements met?: {yesnosimple:52625}  {Only send to provider if standing order protocol not IHK:74259}  Sent to: {Provider or Pharmacy:82090}    Days Supply Given: {rxdayssupply:95293}  Number of Refills: {numberofrefills:95294}      {CARE PARTNERS - Please provider adequate number of refills to get to either next scheduled visit or next recall. For 90 day supply even if they have appt sooner than 90 days it is ok to authorize. Please be mindful of how many refills you are giving for 90 day supplies. For example, 1 fill plus 1 refill of 90 days is 6 months:75688}

## 2023-04-14 NOTE — Unmapped (Addendum)
RN Follow Up     RN explained to the patient that he was not due for labs for another 10 months. Pt. Reports having dry mouth for 3-4 days in a row. No matter how much water he drinks, nothing quenches his thirst. Since he is drinking so much, he also has an increase in urination. RN sent a message to the patient's provider via inbasket.       ----- Message from Orlene Erm, MD sent at 04/14/2023  1:51 PM EDT -----  Sorry. I do not know how to order labs at [facility]. Also he had labs done in July 2024. I do not need anything checked again. He should be seeing me 10 months after his last appt and not sooner. Thanks  DW  ----- Message -----  From: Franki Cabot  Sent: 04/14/2023  11:59 AM EDT  To: Minerva Fester, MD; #    Incoming Call  Caller: [name]  Best callback number: [phone]  Reason for call: Please send lab orders [facility] and please call him once orders have been sent.         Thank you!

## 2023-04-14 NOTE — Unmapped (Signed)
-----   Message from Nelson R sent at 04/14/2023  4:12 PM EDT -----  The PAC has received an incoming call requesting medication refill/clarification/question:    Caller: Daleen Snook callback number: [phone]    Type of request (refill, clarification, question): clarification  Name of medication: albuterol HFA 90 mcg/actuation inhaler  Is there a preferred amount requested (e.g. 30 pills, 3 months worth - if no leave blank):   Desired pharmacy:   [facility] [address], [place] - [address]  [phone] [place] [address]  Phone: [phone] Fax: [phone]        Does caller request a callback from a nurse to discuss?:

## 2023-04-15 NOTE — Unmapped (Deleted)
Medication Requested: ***    {Verify that visits pulled are office visits with provider, not nurse visits:75688}  No future appointments.  Per Provider Note:   {Copy and paste in requested medication listed in provider note above:75688}  Standing order protocol requirements met?: {yesnosimple:52625}  {Only send to provider if standing order protocol not UJW:11914}  Sent to: {Provider or Pharmacy:82090}    Days Supply Given: {rxdayssupply:95293}  Number of Refills: {numberofrefills:95294}      {CARE PARTNERS - Please provider adequate number of refills to get to either next scheduled visit or next recall. For 90 day supply even if they have appt sooner than 90 days it is ok to authorize. Please be mindful of how many refills you are giving for 90 day supplies. For example, 1 fill plus 1 refill of 90 days is 6 months:75688}

## 2023-04-15 NOTE — Unmapped (Signed)
-----   Message from Nelson R sent at 04/14/2023  4:12 PM EDT -----  The PAC has received an incoming call requesting medication refill/clarification/question:    Caller: Daleen Snook callback number: [phone]    Type of request (refill, clarification, question): clarification  Name of medication: albuterol HFA 90 mcg/actuation inhaler  Is there a preferred amount requested (e.g. 30 pills, 3 months worth - if no leave blank):   Desired pharmacy:   [facility] [address], [place] - [address]  [phone] [place] [address]  Phone: [phone] Fax: [phone]        Does caller request a callback from a nurse to discuss?:

## 2023-04-17 NOTE — Unmapped (Addendum)
Spoke to pharmacy, need clarification on inhaler instructions.          ----- Message from Linden R sent at 04/16/2023  3:14 PM EDT -----  The PAC has received an incoming clinical call:    Caller name: Annice Pih   If not the patient, relationship to the patient.[facility]  Best callback number: [phone]  Describe the reason for the call: Clarification about medication albuterol HFA 90 mcg/actuation inhaler   Was an appointment offered as a placeholder?      Thanks!

## 2023-04-18 MED FILL — DUPIXENT 300 MG/2 ML SUBCUTANEOUS PEN INJECTOR: SUBCUTANEOUS | 28 days supply | Qty: 8 | Fill #1

## 2023-04-18 NOTE — Unmapped (Signed)
This is a follow up to the encounter on 10/14. The patient's provider responded to the inbasket message about his symptoms of dry mouth,increased thirst, and urination. Provider put in lab orders for a Hemoglobin A1C and a CMP. The patient request to have the lab orders sent to [facility]. Labs were faxed, and confirmation was received. Pt. Was notified and told that he can get his labs drawn at [facility], any time after 7 to 5 pm. A copy of the labs and fax confirmation were scanned into the media tab.

## 2023-04-21 ENCOUNTER — Other Ambulatory Visit
Admission: RE | Admit: 2023-04-21 | Discharge: 2023-04-21 | Disposition: A | Discharge status: Home / Self Care | Payer: Medicare HMO | Source: Ambulatory Visit | Attending: Specialist | Admitting: Specialist

## 2023-04-21 DIAGNOSIS — E119 Type 2 diabetes mellitus without complications: Secondary | ICD-10-CM | POA: Insufficient documentation

## 2023-04-21 LAB — COMPREHENSIVE METABOLIC PANEL
ALT: 15 U/L (ref 0–44)
AST: 16 U/L (ref 15–41)
Albumin: 3.6 g/dL (ref 3.5–5.0)
Alkaline Phosphatase: 148 U/L — ABNORMAL HIGH (ref 38–126)
Anion gap: 10 (ref 5–15)
BUN: 11 mg/dL (ref 6–20)
CO2: 23 mmol/L (ref 22–32)
Calcium: 8.7 mg/dL — ABNORMAL LOW (ref 8.9–10.3)
Chloride: 96 mmol/L — ABNORMAL LOW (ref 98–111)
Creatinine, Ser: 0.84 mg/dL (ref 0.61–1.24)
GFR, Estimated: >60 mL/min (ref >60)
Glucose, Bld: 389 mg/dL — ABNORMAL HIGH (ref 70–99)
Potassium: 3.9 mmol/L (ref 3.5–5.1)
Sodium: 129 mmol/L — ABNORMAL LOW (ref 135–145)
Total Bilirubin: 0.5 mg/dL (ref 0.3–1.2)
Total Protein: 6.9 g/dL (ref 6.5–8.1)

## 2023-04-21 LAB — HEMOGLOBIN A1C
Hgb A1c MFr Bld: 11.8 % — ABNORMAL HIGH (ref 4.8–5.6)
Mean Plasma Glucose: 291.96 mg/dL

## 2023-04-25 DIAGNOSIS — K2270 Barrett's esophagus without dysplasia: Principal | ICD-10-CM

## 2023-04-25 MED ORDER — PANTOPRAZOLE 20 MG TABLET,DELAYED RELEASE
ORAL_TABLET | Freq: Every day | ORAL | 1 refills | 30 days | Status: CP
Start: 2023-04-25 — End: ?

## 2023-04-25 MED ORDER — ENALAPRIL MALEATE 20 MG TABLET
ORAL_TABLET | Freq: Every day | ORAL | 1 refills | 30 days | Status: CP
Start: 2023-04-25 — End: ?

## 2023-04-25 NOTE — Unmapped (Signed)
Medication Requested: pantoprazole (PROTONIX) 20 MG tablet ,enalapril (VASOTEC) 20 MG tablet       No future appointments.  Per Provider Note: pantoprazole (PROTONIX) 20 MG tablet, enalapril (VASOTEC) 20 MG tablet     Standing order protocol requirements met?: Yes    Sent to: Pharmacy per protocol    Days Supply Given: 30 days  Number of Refills: 1

## 2023-04-29 NOTE — Unmapped (Addendum)
RN Follow Up     Results from [facility] are scanned into the media tab. A message was sent to the provider requesting that these results be reviewed. Pt. was updated.       ----- Message from Karalee Height sent at 04/29/2023 11:45 AM EDT -----  Incoming Call  Caller: [name]  Best callback number: [phone]  Reason for call: Patient would like to know if his test results have came in yet        Thank you!

## 2023-05-01 MED ORDER — ATORVASTATIN 20 MG TABLET
ORAL_TABLET | Freq: Every day | ORAL | 1 refills | 90 days | Status: CP
Start: 2023-05-01 — End: ?

## 2023-05-01 NOTE — Unmapped (Signed)
Medication Requested: atorvastatin      No future appointments.Recall in for 10/30/2023 with Dr. Servando Snare  Per Provider Note: this is prescribed by Dr. Servando Snare    Standing order protocol requirements met?: Yes    Sent to: Pharmacy per protocol    Days Supply Given: 90 days  Number of Refills: 1

## 2023-05-02 DIAGNOSIS — K860 Alcohol-induced chronic pancreatitis: Principal | ICD-10-CM

## 2023-05-02 DIAGNOSIS — E119 Type 2 diabetes mellitus without complications: Principal | ICD-10-CM

## 2023-05-08 NOTE — Unmapped (Addendum)
RN Follow Up     Pt. States that the soonest he can get into Endocrine was 08/05/2023.  Pt. Wanted to make Dr. Servando Snare aware. RN forwarded message via inbasket.         ----- Message from Karalee Height sent at 05/08/2023  1:08 PM EST -----  Incoming Call  Caller: [name]  Best callback number: [phone]  Reason for call: Needs a call back about his blood sugar        Thank you!

## 2023-05-09 NOTE — Unmapped (Signed)
Timothy Tapia reports he continues to have some flaring on hands/backs of legs, but overall still finds Dupixent and his steroid cream helpful.    I encouraged him to schedule a followup.     [facility] Clinical Assessment & Refill Coordination Note    Timothy Tapia, DOB: [birth year]  Phone: [phone] (home) [phone] (work)    All above HIPAA information was verified with patient.     Was a Nurse, learning disability used for this call? No    Specialty Medication(s):   Inflammatory Disorders: Dupixent     Current Outpatient Medications   Medication Sig Dispense Refill    albuterol HFA 90 mcg/actuation inhaler Inhale 2 puffs by mouth prior to study CT scan 8.5 g 0    amlodipine (NORVASC) 5 MG tablet Take 1 tablet (5 mg total) by mouth daily. 30 tablet 9    atorvastatin (LIPITOR) 20 MG tablet Take 1 tablet by mouth once daily 90 tablet 1    beclomethasone (BECONASE AQ) 42 mcg (0.042 %) nasal spray Place 2 sprays into each nostril two (2) times a day. 25 g 9    betamethasone, augmented, (DIPROLENE) 0.05 % ointment Apply twice daily to active areas until smooth. 100 g 9    cetirizine (ZYRTEC) 10 MG tablet Take 1 tablet (10 mg total) by mouth daily. 30 tablet 9    clobetasol (TEMOVATE) 0.05 % ointment Apply topically two (2) times a day. To active areas until smooth 120 g 5    darunavir-cobicistat (PREZCOBIX) 800-150 mg-mg tablet TAKE ONE TABLET BY MOUTH DAILY 30 tablet 11    darunavir-cobicistat (PREZCOBIX) 800-150 mg-mg tablet Take 1 tablet by mouth daily. 30 tablet 9    dolutegravir (TIVICAY) 50 mg TABLET TAKE ONE TABLET BY MOUTH DAILY 30 tablet 11    dolutegravir (TIVICAY) 50 mg TABLET Take 1 tablet (50 mg total) by mouth daily. 30 tablet 9    dupilumab (DUPIXENT PEN) 300 mg/2 mL PnIj Inject the contents of 1 pen (300 mg total) under the skin every seven (7) days. 8 mL 5    empty container Misc Use as directed to dispose of Dupixent pens. 1 each 2    enalapril (VASOTEC) 20 MG tablet TAKE ONE TABLET BY MOUTH EVERY DAY 30 tablet 1    gabapentin (NEURONTIN) 300 MG capsule Take 3 tabs by mouth twice a day 270 capsule 9    meclizine (ANTIVERT) 25 mg tablet Take 1 tablet (25 mg total) by mouth Three (3) times a day as needed for dizziness. 30 tablet 5    mirtazapine (REMERON) 30 MG tablet Take 1 tablet (30 mg total) by mouth daily. 30 tablet 9    pancrelipase, Lip-Prot-Amyl, (CREON) 24,000-76,000 -120,000 unit CpDR delayed release capsule Take 2 capsules by mouth Three (3) times a day with a meal. Take 2 caps with meals, 1 cap with snacks 210 capsule 9    pantoprazole (PROTONIX) 20 MG tablet TAKE ONE TABLET BY MOUTH EVERY DAY 30 tablet 1    sildenafil (VIAGRA) 100 MG tablet Take 1 tablet (100 mg total) by mouth once as needed for erectile dysfunction. Take one half to one whole tablet 30 minutes prior to sex. 10 tablet 5     Current Facility-Administered Medications   Medication Dose Route Frequency Provider Last Rate Last Admin    triamcinolone acetonide (KENALOG-40) injection 40 mg  40 mg Intra-articular Once Evaristo B[place], [place]            Changes to  medications: [name] reports no changes at this time.    Allergies   Allergen Reactions    Sulfa (Sulfonamide Antibiotics) Hives    Sulfur Hives       Changes to allergies: No    SPECIALTY MEDICATION ADHERENCE     Dupixent - 1 left  Medication Adherence    Patient reported X missed doses in the last month: 0  Specialty Medication: Dupixent          Specialty medication(s) dose(s) confirmed: Regimen is correct and unchanged.     Are there any concerns with adherence? No    Adherence counseling provided? Not needed    CLINICAL MANAGEMENT AND INTERVENTION      Clinical Benefit Assessment:    Do you feel the medicine is effective or helping your condition? Yes    Clinical Benefit counseling provided? Not needed    Adverse Effects Assessment:    Are you experiencing any side effects? No    Are you experiencing difficulty administering your medicine? No    Quality of Life Assessment:    Quality of Life    Rheumatology  Oncology  Dermatology  1. What impact has your specialty medication had on the symptoms of your skin condition (i.e. itchiness, soreness, stinging)?: Some  2. What impact has your specialty medication had on your comfort level with your skin?: Some  Cystic Fibrosis          How many days over the past month did your AD  keep you from your normal activities? For example, brushing your teeth or getting up in the morning. Patient declined to answer    Have you discussed this with your provider? Not needed    Acute Infection Status:    Acute infections noted within Epic:  No active infections  Patient reported infection: None    Therapy Appropriateness:    Is therapy appropriate based on current medication list, adverse reactions, adherence, clinical benefit and progress toward achieving therapeutic goals? Yes, therapy is appropriate and should be continued     DISEASE/MEDICATION-SPECIFIC INFORMATION      For patients on injectable medications: Patient currently has 1 doses left.  Next injection is scheduled for 11/14 (Takes weekly).    Chronic Inflammatory Diseases: Have you experienced any flares in the last month? Yes, still flares some, overall greatly improved from baseline  Has this been reported to your provider? Yes, but due for an upcoming visit. Encouraged him to schedule.    PATIENT SPECIFIC NEEDS     Does the patient have any physical, cognitive, or cultural barriers? No    Is the patient high risk? No    Did the patient require a clinical intervention? No    Does the patient require physician intervention or other additional services (i.e., nutrition, smoking cessation, social work)? No    SOCIAL DETERMINANTS OF HEALTH     At the [facility], we have learned that life circumstances - like trouble affording food, housing, utilities, or transportation can affect the health of many of our patients.   That is why we wanted to ask: are you currently experiencing any life circumstances that are negatively impacting your health and/or quality of life? Patient declined to answer    Social Determinants of Health     Food Insecurity: Food Insecurity Present (08/23/2021)    Hunger Vital Sign     Worried About Running Out of Food in the Last Year: Sometimes true     Ran Out of Food  in the Last Year: Sometimes true   Internet Connectivity: Not on file   Housing/Utilities: Low Risk  (08/23/2021)    Housing/Utilities     Within the past 12 months, have you ever stayed: outside, in a car, in a tent, in an overnight shelter, or temporarily in someone else's home (i.e. couch-surfing)?: No     Are you worried about losing your housing?: No     Within the past 12 months, have you been unable to get utilities (heat, electricity) when it was really needed?: No   Tobacco Use: High Risk (01/06/2023)    Patient History     Smoking Tobacco Use: Every Day     Smokeless Tobacco Use: Never     Passive Exposure: Current   Transportation Needs: No Transportation Needs (08/23/2021)    PRAPARE - Transportation     Lack of Transportation (Medical): No     Lack of Transportation (Non-Medical): No   Alcohol Use: Not At Risk (08/23/2021)    Alcohol Use     How often do you have a drink containing alcohol?: Never     How many drinks containing alcohol do you have on a typical day when you are drinking?: 1 - 2     How often do you have 5 or more drinks on one occasion?: Never   Interpersonal Safety: Unknown (05/09/2023)    Interpersonal Safety     Unsafe Where You Currently Live: Not on file     Physically Hurt by Anyone: Not on file     Abused by Anyone: Not on file   Physical Activity: Sufficiently Active (08/23/2021)    Exercise Vital Sign     Days of Exercise per Week: 5 days     Minutes of Exercise per Session: 30 min   Intimate Partner Violence: Not At Risk (08/23/2021)    Humiliation, Afraid, Rape, and Kick questionnaire     Fear of Current or Ex-Partner: No     Emotionally Abused: No Physically Abused: No     Sexually Abused: No   Stress: No Stress Concern Present (08/23/2021)    [employer] of Occupational Health - Occupational Stress Questionnaire     Feeling of Stress : Not at all   Substance Use: Not on file (05/09/2023)   Social Connections: Moderately Isolated (08/23/2021)    Social Connection and Isolation Panel [NHANES]     Frequency of Communication with Friends and Family: More than three times a week     Frequency of Social Gatherings with Friends and Family: More than three times a week     Attends Religious Services: More than 4 times per year     Active Member of [employer] or Organizations: No     Attends Banker Meetings: Never     Marital Status: Separated   Physicist, medical Strain: Low Risk  (08/23/2021)    Overall Financial Resource Strain (CARDIA)     Difficulty of Paying Living Expenses: Not hard at all   Depression: Not at risk (03/18/2022)    PHQ-2     PHQ-2 Score: 0   Health Literacy: Low Risk  (08/23/2021)    Health Literacy     : Never       Would you be willing to receive help with any of the needs that you have identified today? Not applicable       SHIPPING     Specialty Medication(s) to be Shipped:   Inflammatory Disorders: Dupixent    Other  medication(s) to be shipped: No additional medications requested for fill at this time     Changes to insurance: No    Delivery Scheduled: Yes, Expected medication delivery date: 11/15.     Medication will be delivered via Same Day Courier to the confirmed prescription address in [facility]    The patient will receive a drug information handout for each medication shipped and additional FDA Medication Guides as required.  Verified that patient has previously received a Conservation officer, historic buildings and a Surveyor, mining.    The patient or caregiver noted above participated in the development of this care plan and knows that they can request review of or adjustments to the care plan at any time.      All of the patient's questions and concerns have been addressed.    [facility] Specialty Pharmacist

## 2023-05-12 MED ORDER — CLOTRIMAZOLE 10 MG TROCHE
ORAL_TABLET | ORAL | 3 refills | 14.00000 days | Status: CP
Start: 2023-05-12 — End: 2023-05-12

## 2023-05-12 NOTE — Unmapped (Addendum)
3rd Follow up Call-  Called [name] back and let him know Dr. Servando Snare thinks it is probably thrush and is calling him in medication.    2nd-  Pt called back. He says it burns his tongue when he drinks soft drinks, Spicy food. He says tea is only thing that doesn't burn it. I told him I would speak to Dr. Servando Snare and see what his suggestion would be.      Follow-up Call:  Tried to call patient back. LVM that I was returning his call.  --------------------------------------------------------------------------------------------------------    ----- Message from [place] R sent at 05/12/2023 10:27 AM EST -----  Incoming Call  Caller: [name]  Best callback number: [phone]  Reason for call: dry mouth and irritation to his tongue         Thank you!

## 2023-05-13 MED ORDER — CLOTRIMAZOLE 10 MG TROCHE
ORAL_TABLET | ORAL | 3 refills | 14 days | Status: CP
Start: 2023-05-13 — End: ?

## 2023-05-13 NOTE — Unmapped (Signed)
Called patient and let him know I resent meds to the pharmacy he preferred. He said he had picked it up.

## 2023-05-14 MED ORDER — ALBUTEROL SULFATE HFA 90 MCG/ACTUATION AEROSOL INHALER
5 refills | 0 days
Start: 2023-05-14 — End: ?

## 2023-05-16 MED FILL — DUPIXENT 300 MG/2 ML SUBCUTANEOUS PEN INJECTOR: SUBCUTANEOUS | 28 days supply | Qty: 8 | Fill #2

## 2023-06-12 MED FILL — DUPIXENT 300 MG/2 ML SUBCUTANEOUS PEN INJECTOR: SUBCUTANEOUS | 28 days supply | Qty: 8 | Fill #3

## 2023-06-12 NOTE — Unmapped (Signed)
[facility] Refill Coordination Note    Specialty Medication(s) to be Shipped:   Inflammatory Disorders: Dupixent    Other medication(s) to be shipped: No additional medications requested for fill at this time     Timothy Tapia, DOB: [birth year]  Phone: [phone] (home) [phone] (work)      All above HIPAA information was verified with patient.     Was a Nurse, learning disability used for this call? No    Completed refill call assessment today to schedule patient's medication shipment from the [facility] [phone]).  All relevant notes have been reviewed.     Specialty medication(s) and dose(s) confirmed: Regimen is correct and unchanged.   Changes to medications: Timothy Tapia reports no changes at this time.  Changes to insurance: No  New side effects reported not previously addressed with a pharmacist or physician: None reported  Questions for the pharmacist: No    Confirmed patient received a Conservation officer, historic buildings and a Surveyor, mining with first shipment. The patient will receive a drug information handout for each medication shipped and additional FDA Medication Guides as required.       DISEASE/MEDICATION-SPECIFIC INFORMATION        For patients on injectable medications: Patient currently has 1 doses left.  Next injection is scheduled for 06/12/23.    SPECIALTY MEDICATION ADHERENCE     Medication Adherence    Patient reported X missed doses in the last month: 0  Specialty Medication: dupilumab (DUPIXENT PEN) 300 mg/2 mL PnIj  Patient is on additional specialty medications: No  Informant: patient              Were doses missed due to medication being on hold? No    DUPIXENT PEN 300 mg/2 mL Pnij (dupilumab)  : 1 days of medicine on hand       REFERRAL TO PHARMACIST     Referral to the pharmacist: Not needed      [facility]     Shipping address confirmed in Epic.       Delivery Scheduled: Yes, Expected medication delivery date: 06/13/23.     Medication will be delivered via Same Day Courier to the prescription address in Epic WAM.    [name]   [facility] Specialty Technician

## 2023-06-16 NOTE — Unmapped (Signed)
Timothy Tapia wrote a My Chart message c/o dry throat and swallowing issues. Returned call. Stated the Mycelex troches prescribed for his dry mouth were not helping. Wanted to know if Dr.Wohl needed to put in for an xray of some sort. Told [name] I would let Dr.Wohl know and get back to him.   Asked if pt could see Medicine - appt made for 12/17 with the same day clinic with [name]'s approval for 9am.

## 2023-06-17 ENCOUNTER — Ambulatory Visit: Admit: 2023-06-17 | Discharge: 2023-06-18 | Payer: MEDICAID

## 2023-06-17 DIAGNOSIS — E1165 Type 2 diabetes mellitus with hyperglycemia: Principal | ICD-10-CM

## 2023-06-17 DIAGNOSIS — R09A2 Globus sensation: Principal | ICD-10-CM

## 2023-06-17 DIAGNOSIS — R1310 Dysphagia, unspecified: Principal | ICD-10-CM

## 2023-06-17 DIAGNOSIS — R682 Dry mouth, unspecified: Principal | ICD-10-CM

## 2023-06-17 MED ORDER — LANCETS
0 refills | 0.00 days | Status: CP
Start: 2023-06-17 — End: ?

## 2023-06-17 MED ORDER — METFORMIN ER 500 MG TABLET,EXTENDED RELEASE 24 HR
ORAL_TABLET | Freq: Every day | ORAL | 3 refills | 0.00 days | Status: CP
Start: 2023-06-17 — End: 2024-06-16
  Filled 2023-06-17: qty 120, 35d supply, fill #0

## 2023-06-17 MED ORDER — BLOOD-GLUCOSE METER KIT WRAPPER
11 refills | 0.00 days | Status: CP
Start: 2023-06-17 — End: 2024-06-16

## 2023-06-17 MED ORDER — BLOOD GLUCOSE TEST STRIPS
ORAL_STRIP | 0 refills | 0.00 days | Status: CP
Start: 2023-06-17 — End: ?

## 2023-06-17 NOTE — Unmapped (Addendum)
It was great seeing you today.     Here's a summary of what we discussed today during your visit:  Check A1c today  Start metformin XR for diabetes  Will refer you for an upper endoscopy (EGD) and a swallow study to evaluate your difficulty swallowing. Call [facility] to schedule your endoscopy at [phone]  Consider using Biotene mouth spray or sugar-free lozenges for dry mouth  Consider stopping meclizine and cetirizine (Zyrtec) as these medications can worsen dry mouth    Domingo Dimes, MD, PharmD  [facility] Internal Medicine

## 2023-06-17 NOTE — Unmapped (Signed)
INTERNAL MEDICINE ADVANCED SAME DAY CLINIC NOTE     06/17/2023    PCP: Minerva Fester, MD     Assessment and Plan    Timothy Tapia was seen today for follow-up.    Diagnoses and all orders for this visit:    Dysphagia, unspecified type  Globus sensation  Presents with 1 month of sensation of something being stuck in his throat whenever he eats or drinks, happens with both liquids and solids. Occasionally has regurgitation of chewed food. No throat pain, voice changes, weight loss, fever, chills, history of thyroid issues. Was prescribed clotrimazole troches by his PCP which he has been taking for a few weeks without improvement. History of GERD on pantoprazole daily and history of allergies on zyrtec daily. History of gastric antrum mass s/p resection, esophageal perforation s/p surgical repair, and ?Barrett's esophagus?, with last endoscopy in 2019. Feel GERD and  allergies is less likely the cause given he has been taking pantoprazole and zyrtec daily. Given history of possible barrett's, gastric antrum mass, and HIV, concern for malignancy. Also consider esophageal stricture given history of esophageal surgery and achalasia given symptoms with both solids and liquids. Will start with EGD and swallow study. Can also consider laryngoscopy, esophageal manometry.  -     FL Water Soluble Contrast Swallow; Future  -     Upper Endoscopy; Future    Type 2 diabetes mellitus with hyperglycemia, without long-term current use of insulin (CMS-HCC)  A1c elevated at 8% on 01/06/23. Patient reports dry mouth, feeling thirsty all the time, frequent urination. Will repeat A1c today. He was referred to endocrinology by his PCP but doesn't have an appointment until February. He reports GI upset with taking metformin a few years ago, also felt like he got sick when he tried Actos. Discussed trying metformin ER which is better tolerated and he was agreeable.  -     Hemoglobin A1c  -     metFORMIN (GLUCOPHAGE-XR) 500 MG 24 hr tablet; Take 1 tablet (500 mg) by mouth daily with breakfast for 7 days, THEN 2 tablets (1,000 mg total) daily with breakfast for 7 days, THEN 2 tablets (1,000 mg total) twice daily with breakfast and dinner thereafter.  -     blood-glucose meter kit; Use as directed to test blood sugar.  Disp. blood glucose meter kit preferred by patient's insurance. Dx: Diabetes, E11.9  -     blood sugar diagnostic (GLUCOSE BLOOD) Strp; Check blood sugar as directed once a day and for symptoms of high or low blood sugar.  -     lancets Misc; Check blood sugar as directed once a day and for symptoms of high or low blood sugar.    Dry mouth - White patches on tongue  Dry mouth for about 1 month, not improved with clotrimazole troches prescribed by PCP  for thrush. Has been taking meclizine PRN for dizziness and daily zyrtec which can be contributing. Also suspect his uncontrolled diabetes is contributing. On exam does have some white areas on tongue, unable to be removed with tongue depressor. Unclear if this is just food residue, thrush, or other etiology like hairy leukoplakia given history of HIV.  - Decrease zyrtec and meclizine use as able  - starting metformin as above  - consider biopsy of white patches on tongue if it persists    Follow up as scheduled or sooner as needed.    Patient was seen and discussed with Dr. Jon Billings who is in agreement with the  assessment and plan as outlined above.       Subjective    Problem List:  Patient Active Problem List   Diagnosis    Chorioretinal scar    Human immunodeficiency virus (HIV) disease (CMS-HCC)    Pancreatitis    Tobacco use disorder    HTN (hypertension)    Barrett esophagus    Asthma    Colitis    DVT (deep venous thrombosis), IMV thrombus 10/07/13 provoked per hematology (71mo therapy)    Pancreatic pseudocyst    GI bleeding    ETOH abuse    COPD (chronic obstructive pulmonary disease) (CMS-HCC)    Hypokalemia    GERD (gastroesophageal reflux disease)    Chronic bilateral low back pain Phimosis    Uncircumcised male    Type 2 diabetes mellitus with hyperglycemia, without long-term current use of insulin (CMS-HCC)     HPI  Timothy Tapia is a 57 y.o. year old male with the above problem list who presents to the same day clinic for   Chief Complaint   Patient presents with    Follow-up     Feels like something in throat. Patient stated he had surgery years ago on his esophagus.       Dry mouth for a month  Feels like something is stuck in his throat. Feels like he is being strangled when he eats. Happens all the time with eating and drinking  Had an esophageal surgery - said he had holes in his esophagus  No voice changes, no fever or chills  History of GERD, takes pantoprazole 20 mg daily  Has a history of allergies, takes zyrtec 10 mg daily  No alcohol  Smokes 1 pack every 3 days    Meds and allergies were reviewed in Epic    ROS: 10 point ROS was performed and is otherwise negative other than mentioned in the HPI    Objective  PE:  Vitals:    06/17/23 0854   BP: 122/59   Pulse: 77   Resp: 18   Temp: 36.4 ??C (97.5 ??F)   SpO2: 97%     General: well-appearing in NAD  Eyes: EOMI, sclera clear, PERRL  ENT: OP clear w/o erythema or exudate. Areas of whiteness on tongue not able to be scraped off with tongue depressor. No palpable masses or tenderness to palpation over throat/thyroid  CV: regular, no murmurs  Resp: CTAB, no wheezes or crackles, normal WOB  GI: soft, NTND, NABS  MSK: full ROM, no deformity noted  Skin: clean and dry, no rashes or lesions noted  Ext: no cyanosis/clubbing/edema  Neuro: alert, follows commands. CN II-XII grossly intact    Procedure: None  See procedure note from this encounter    _______________________________________________________________________________________      Same Day Metric Tracker:    Same Day Metric Tracker:  Referral source for today's visit:  Infectious Diseases    Referral to other outpatient urgent services? N/A    Did today's visit result in referral to ED or direct admission? No

## 2023-06-18 NOTE — Unmapped (Signed)
Addended by: Aaron Mose on: 06/18/2023 01:10 PM     Modules accepted: Orders

## 2023-06-18 NOTE — Unmapped (Signed)
Timothy Tapia called stating he had been to the [facility] and they set him up with getting a glucose monitor. He went to the pharmacy where they showed him how to work the monitor. He checked his sugar - it was 400 and wanted to know what he should do.   Instructed him to start his metformin and eat as usual.  He could check his sugar again to see which direction it was going but unless he was feeling bad - ie Dizzy, confused, feeling shaky ect. He should be ok. Stated he felt normal now.   Encouraged to write down his results with the time of day even though his monitor may keep track to see how his sugar level changes.   Encouraged to call back if he should have any questions. Stated understanding.

## 2023-06-18 NOTE — Unmapped (Signed)
Changed barium swallow study to external referral for [facility] per patient request.

## 2023-06-18 NOTE — Unmapped (Signed)
Addended by: Aaron Mose on: 06/18/2023 11:42 AM     Modules accepted: Orders

## 2023-06-19 LAB — HEMOGLOBIN A1C: HEMOGLOBIN A1C: >14 % — ABNORMAL HIGH (ref 4.8–5.6)

## 2023-06-19 NOTE — Unmapped (Addendum)
1st attempt sent patient well text message to schedule [facility] Patient care visit , mc - 12/19         ----- Message from Domingo Dimes, MD sent at 06/19/2023 11:11 AM EST -----  Regarding: Please schedule  Hello,    Would you please help get this patient scheduled to establish care? A1c came back  >14%. Appointment with endocrinology not until 08/05/23. Would like this addressed ASAP.    Thank you,  Domingo Dimes, MD, PharmD  [facility] Internal Medicine, PGY-2

## 2023-06-19 NOTE — Unmapped (Signed)
 I saw and evaluated the patient, participating in the key portions of the service.  I reviewed the resident???s note.  I agree with the resident???s findings and plan. Andres Shad, MD

## 2023-06-20 DIAGNOSIS — E1165 Type 2 diabetes mellitus with hyperglycemia: Principal | ICD-10-CM

## 2023-06-20 NOTE — Unmapped (Signed)
Called Mr. Shertzer given his concerns about his hyperglycemia. He reports he has not been able to get his glucose below 180. He started metformin ER 500 mg and has gone up to taking it twice a day and is tolerating it well. We discussed that he could take both pills at once in the morning. Sugars have been in the 240s today. He is checking his sugars ever few hours. We discussed avoiding white carbohydrates and eating fruits with protein, like eggs or yogurt. We also discussed that he can check his sugars daily for now or when he is feeling badly but that he doesn't need to keep checking every few hours. As long as he is feeling okay and his sugars aren't staying quite high (such as >400), he is okay to wait for follow up with Arby Barrette on 12/24.    We discussed that he will likely need to be started on insulin at his upcoming visit with Arby Barrette. His brother has diabetes so they have been talking about this possibility and he is ready to start insulin if he needs it. He will need to check his sugars more once he is on insulin. He would prefer to get his sugars under control as quickly as possible.    The patient reports they are physically located in [place] and is currently: at home. I conducted a phone visit.  I spent 10 minutes on the phone call with the patient on the date of service .       Maeola Sarah, MD  [facility] Internal Medicine at [facility]

## 2023-06-20 NOTE — Unmapped (Signed)
Internal Medicine Dwana Melena, [place])  06/24/2023 at 10:00 AM

## 2023-06-20 NOTE — Unmapped (Signed)
2nd attempt mychart message sent to patient -Est care appt    Timothy Tapia

## 2023-06-20 NOTE — Unmapped (Signed)
[name] called pac staff. His blood sugar was 218. I called Him back and spoke with him. He says he has not been able to get blood sugar down under 189 over the last few days. I told him he needed to call internal medicine, they are the ones who prescribed metformin this past time. I provided him with the phone number. Pt verbalized understanding.

## 2023-06-24 ENCOUNTER — Ambulatory Visit: Admit: 2023-06-24 | Payer: MEDICAID

## 2023-06-26 DIAGNOSIS — K2270 Barrett's esophagus without dysplasia: Principal | ICD-10-CM

## 2023-06-26 DIAGNOSIS — I10 Essential (primary) hypertension: Principal | ICD-10-CM

## 2023-06-26 MED ORDER — ENALAPRIL MALEATE 20 MG TABLET
ORAL_TABLET | Freq: Every day | ORAL | 1 refills | 90.00 days | Status: CP
Start: 2023-06-26 — End: ?

## 2023-06-26 MED ORDER — PANTOPRAZOLE 20 MG TABLET,DELAYED RELEASE
ORAL_TABLET | Freq: Every day | ORAL | 1 refills | 90.00 days | Status: CP
Start: 2023-06-26 — End: ?

## 2023-06-26 NOTE — Unmapped (Signed)
Medication Requested: Timothy Tapia      Future Appointments   Date Time Provider Department Center   07/17/2023  9:30 AM Kathrin Penner, MD [facility]   08/05/2023  9:00 AM Tamala Fothergill, DO [facility]   11/03/2023  9:00 AM Servando Snare Rolene Course, MD [facility]     Per Provider Note: in med list    Standing order protocol requirements met?: Yes    Sent to: Pharmacy per protocol    Days Supply Given: 90 days  Number of Refills: 1

## 2023-07-14 NOTE — Unmapped (Signed)
[facility] Refill Coordination Note    Specialty Medication(s) to be Shipped:   Inflammatory Disorders: Dupixent    Other medication(s) to be shipped: No additional medications requested for fill at this time     Timothy Tapia, DOB: [birth year]  Phone: [phone] (home) [phone] (work)      All above HIPAA information was verified with patient.     Was a Nurse, learning disability used for this call? No    Completed refill call assessment today to schedule patient's medication shipment from the [facility] [phone]).  All relevant notes have been reviewed.     Specialty medication(s) and dose(s) confirmed: Regimen is correct and unchanged.   Changes to medications: Timothy Tapia reports starting the following medications: metFORMIN (GLUCOPHAGE-XR) 500 MG 24 hr tablet  2 tablets (1,000 mg total) daily  Changes to insurance: No  New side effects reported not previously addressed with a pharmacist or physician: None reported  Questions for the pharmacist: No    Confirmed patient received a Conservation officer, historic buildings and a Surveyor, mining with first shipment. The patient will receive a drug information handout for each medication shipped and additional FDA Medication Guides as required.       DISEASE/MEDICATION-SPECIFIC INFORMATION        For patients on injectable medications: Patient currently has 1 doses left.  Next injection is scheduled for 07/15/22.    SPECIALTY MEDICATION ADHERENCE     Medication Adherence    Patient reported X missed doses in the last month: 0  Specialty Medication: dupilumab (DUPIXENT PEN) 300 mg/2 mL PnIj [  Patient is on additional specialty medications: No  Informant: patient              Were doses missed due to medication being on hold? No    DUPIXENT PEN 300 mg/2 mL Pnij (dupilumab)  : 2 days of medicine on hand       REFERRAL TO PHARMACIST     Referral to the pharmacist: Not needed      [facility]     Shipping address confirmed in Epic.       Delivery Scheduled: Yes, Expected medication delivery date: 07/17/22.     Medication will be delivered via Same Day Courier to the prescription address in Epic WAM.    Timothy Tapia   [facility] Specialty Technician

## 2023-07-15 NOTE — Unmapped (Signed)
-----   Message from [place] P sent at 07/15/2023  8:16 AM EST -----  Incoming Call  Caller: [name]  Best callback number: [phone]  Reason for call: Please call patient regarding issues        Thank you!

## 2023-07-18 MED FILL — DUPIXENT 300 MG/2 ML SUBCUTANEOUS PEN INJECTOR: SUBCUTANEOUS | 28 days supply | Qty: 8 | Fill #4

## 2023-07-28 NOTE — Unmapped (Addendum)
RN Follow up call:  Called moe back and gave him this recommendation from Dr. Servando Snare. He sad he was going to continue to try and take it until appt next week.    --------------------------------------------------------------------------------------------    ----- Message from Orlene Erm, MD sent at 07/28/2023 11:44 AM EST -----  I see an appt with Endo Feb 4. He can stop the metformin if making him sick.    DW  ----- Message -----  From: Georgina Peer, RN  Sent: 07/28/2023   9:51 AM EST  To: Minerva Fester, MD    Just let me know what to tell him  ----- Message -----  From: Daleen Snook  Sent: 07/28/2023   9:13 AM EST  To: #    Incoming Call  Caller: Timothy Tapia  Best callback number: [phone]  Reason for call: patient states that metformin is causing him to have bad gas, it is also causing him a great amount of pain.        Thank you!

## 2023-07-28 NOTE — Unmapped (Addendum)
RN Follow-up Call:  Returned patients call. Gave him Dr. Vilma Prader message below. He voiced understanding. I let him know his endocrine appt is soon on 2/4 and they would be taking over diabetes care.    -------------------------------------------------------------------------------------------      ----- Message from Orlene Erm, MD sent at 07/28/2023 11:43 AM EST -----  Hi. His diabetes is poorly controlled and he does not tolerate metformin. This rises above the [facility] level. He needs to see Internal Medicine if not seeing Endocrine any time soon.    DW  ----- Message -----  From: Georgina Peer, RN  Sent: 07/28/2023   9:51 AM EST  To: Minerva Fester, MD    Just let me know what to tell him  ----- Message -----  From: Daleen Snook  Sent: 07/28/2023   9:13 AM EST  To: #    Incoming Call  Caller: [name]  Best callback number: [phone]  Reason for call: patient states that metformin is causing him to have bad gas, it is also causing him a great amount of pain.        Thank you!

## 2023-08-05 ENCOUNTER — Ambulatory Visit
Admit: 2023-08-05 | Discharge: 2023-08-06 | Payer: MEDICAID | Attending: Student in an Organized Health Care Education/Training Program | Primary: Student in an Organized Health Care Education/Training Program

## 2023-08-05 DIAGNOSIS — E119 Type 2 diabetes mellitus without complications: Principal | ICD-10-CM

## 2023-08-05 DIAGNOSIS — K860 Alcohol-induced chronic pancreatitis: Principal | ICD-10-CM

## 2023-08-05 LAB — ALBUMIN / CREATININE URINE RATIO
ALBUMIN QUANT URINE: <0.3 mg/dL
CREATININE, URINE: 21.1 mg/dL

## 2023-08-05 LAB — C-PEPTIDE: C-PEPTIDE: 1.62 ng/mL (ref 0.48–5.05)

## 2023-08-05 LAB — GLUCOSE, RANDOM: GLUCOSE RANDOM: 127 mg/dL (ref 70–179)

## 2023-08-05 NOTE — Unmapped (Signed)
[employer], in Careers information officer.  BG done today.  PP: fasting  BG: 121 mg/dL

## 2023-08-05 NOTE — Unmapped (Signed)
It was great seeing you in clinic today! Please see below for an overview of what we discussed:     - Continue taking Metformin at 500mg  (1 pill) nightly.     - Based on your lab tests, will consider adding a once per day medication Jardiance to support your blood glucose.     - Decrease your intake of sodas, juices, sweet tea to help reduce your blood sugar.     If you have labs drawn, you will be notified through the MyChart Portal when they have been reviewed. You will be contacted to schedule appointments for any referrals placed.     If you have any questions or concerns before your next appointment, do not hesitate to reach out via the MyChart Portal.

## 2023-08-05 NOTE — Unmapped (Signed)
[facility] DIABETES AND ENDOCRINOLOGY [place]  [address]  [place] [address]   [place] [address]  Phone: [phone]  Fax: [phone]    Endocrine Clinic Visit     Assessment/Plan:   Timothy Tapia is a 58 y.o. 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  With their age, complications and current regimen an appropriate A1c goal is <7.5%.  They are currently exceeding this goal.     Patient with at least 4 year history of T2DM. Previously well controlled on Metformin monotherapy. Recently experienced abrupt worsening of glucose control.      Given his history this acute change may suggest development of insulin deficiency related to pancreatitis or cocaine related hyperglycemia.  However, based on POC testing and resolution of polyuria/polydipsia he appears to have improved significantly back to prior baseline over recent weeks. He attributes this to change in SSB intake.     - C-peptide evaluation preserved today. Based on this and his POC testing, will benefit from optimization of non-insulin agents.   - Start Jardiance 10mg  once daily.  Reviewed risks and benefits with patient today including risk of GU infection.   - Continue Metformin at 500mg  once daily. Currently unable to tolerate further increased dosing.   - Will avoid incretin therapies at this time given his weight and pancreatitis history.     Monitoring for diabetes complications:  Renal: None. Albuminuria screen today negative.   Retinal: None.  Pending dilated exam tomorrow locally.   Neurological: Yes, attributed to chronic back pain, managed on Gabapentin. Foot exam UTD with PCP.   CVD: None.    BP at goal on current regimen including ACEi   Lipids: Managed on Atorvastatin 20mg  daily. Monitor at next visit.   FIB-4 Calculation: 0.54 at 10/16/2022 12:00 AM    Problem List Items Addressed This Visit       Pancreatitis    Type 2 diabetes mellitus without complication, without long-term current use of insulin (CMS-HCC) - Primary    Relevant Orders    C-peptide (Completed)    Microalbumin / creatinine urine ratio (Completed)    Glucose, Random (Completed)    Mixed hyperlipidemia    Essential (primary) hypertension     Return in about 2 months (around 10/03/2023).    Orders Placed This Encounter   Procedures    C-peptide    Microalbumin / creatinine urine ratio    Glucose, Random     CC: Type 2 DM.    PCP: Minerva Fester, MD    Referring provider: Rolene Course Wohl    Tamala Fothergill, DO  [facility]    Subjective:    Timothy Tapia is a 58 y.o. 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     Diagnosed:   - Patient unclear regarding initial diagnosis, but diagnosed at least 2021. Well controlled for several years.   - History notable for multiple episodes of acute pancreatitis related to alcohol use ([id number]). Denies resulting diabetes but has required chronic creon use.   - Abrupt worsening of hyperglycemia at the end of 2024 for unclear reasons. Denies substance use during this time. Challenges with regular soda intake but has stopped in recent months.     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 No known ulcers or foot wounds.   No known retinopathy or nephropathy.     Current diabetes regimen:  - Metformin 500mg  daily. Previously on 1g BID, unable to tolerate due to GI side effect.     Previous treatments:   - Actos documented. Intolerant for unknown reason.     Meter downloaded and reviewed, including trends, logbook, and daily data as displayed in [place] application. Highlight/snapshot view.    62% TIR.  Average readings per day 1.2   Recent fasting 120-150, 121 in clinic today.  180-300 postprandial in evenings.      Diet:   - Reports challenges with sweet cravings since being sober. High use of regular soda in 2024, has switched to low or zero sugar alternatives in the new year.     Exercise   - Limited by chronic back pain.     Social History:   - Current smoker, 0.3 ppd.   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     Lab Results   Component Value Date    A1C >14.0 (H) 06/17/2023    A1C 8.0 (H) 01/06/2023    A1C 5.8 (H) 09/09/2022    A1C 5.8 (H) 05/20/2022    A1C 6.0 (H) 03/18/2022       Wt Readings from Last 6 Encounters:   06/17/23 72.1 kg (159 lb)   01/06/23 72.1 kg (159 lb)   10/16/22 68.9 kg (151 lb 14.4 oz)   10/15/22 68.3 kg (150 lb 8 oz)   09/19/22 68.1 kg (150 lb 2.1 oz)   09/09/22 69.6 kg (153 lb 6.4 oz)     Past Medical History:   Diagnosis Date    Acute pancreatitis     Barrett esophagus     Black stool 07/08/2017    Bronchitis     Colon polyp     Eczema     Gastroesophageal reflux disease     HIV disease (CMS-HCC)     Hypertension     Lack of access to transportation     Pancreatic pseudocyst     Thrombus     IMV thrombus r/t colitis & pancreatitis 10/07/13    Uncircumcised male 05/30/2022    Visual impairment        Current Outpatient Medications   Medication Sig Dispense Refill    albuterol HFA 90 mcg/actuation inhaler Inhale 2 puffs by mouth prior to study CT scan 8.5 g 0    amlodipine (NORVASC) 5 MG tablet Take 1 tablet (5 mg total) by mouth daily. 30 tablet 9    atorvastatin (LIPITOR) 20 MG tablet Take 1 tablet by mouth once daily 90 tablet 1    beclomethasone (BECONASE AQ) 42 mcg (0.042 %) nasal spray Place 2 sprays into each nostril two (2) times a day. 25 g 9    betamethasone, augmented, (DIPROLENE) 0.05 % ointment Apply twice daily to active areas until smooth. 100 g 9    blood sugar diagnostic (GLUCOSE BLOOD) Strp Check blood sugar as directed once a day and for symptoms of high or low blood sugar. 100 strip 0    blood-glucose meter kit Use as directed to test blood sugar.  Disp. blood glucose meter kit preferred by patient's insurance. Dx: Diabetes, E11.9 1 each 11    cetirizine (ZYRTEC) 10 MG tablet Take 1 tablet (10 mg total) by mouth daily. 30 tablet 9    clobetasol (TEMOVATE) 0.05 %  ointment Apply topically two (2) times a day. To active areas until smooth 120 g 5    clotrimazole (MYCELEX) 10 mg troche Take 1 tablet (10 mg total) by mouth five (5) times a day. 70 tablet 3    darunavir-cobicistat (PREZCOBIX) 800-150 mg-mg tablet TAKE ONE TABLET BY MOUTH DAILY 30 tablet 11    darunavir-cobicistat (PREZCOBIX) 800-150 mg-mg tablet Take 1 tablet by mouth daily. 30 tablet 9    dolutegravir (TIVICAY) 50 mg TABLET TAKE ONE TABLET BY MOUTH DAILY 30 tablet 11    dolutegravir (TIVICAY) 50 mg TABLET Take 1 tablet (50 mg total) by mouth daily. 30 tablet 9    dupilumab (DUPIXENT PEN) 300 mg/2 mL PnIj Inject the contents of 1 pen (300 mg total) under the skin every seven (7) days. 8 mL 5    empty container Misc Use as directed to dispose of Dupixent pens. 1 each 2    enalapril (VASOTEC) 20 MG tablet TAKE ONE TABLET BY MOUTH EVERY DAY 90 tablet 1    gabapentin (NEURONTIN) 300 MG capsule Take 3 tabs by mouth twice a day 270 capsule 9    lancets Misc Check blood sugar as directed once a day and for symptoms of high or low blood sugar. 100 each 0    meclizine (ANTIVERT) 25 mg tablet Take 1 tablet (25 mg total) by mouth Three (3) times a day as needed for dizziness. 30 tablet 5    metFORMIN (GLUCOPHAGE-XR) 500 MG 24 hr tablet Take 1 tablet (500 mg) by mouth daily with breakfast for 7 days, THEN 2 tablets (1,000 mg total) daily with breakfast for 7 days, THEN 2 tablets (1,000 mg total) twice daily with breakfast and dinner thereafter. 120 tablet 3    mirtazapine (REMERON) 30 MG tablet Take 1 tablet (30 mg total) by mouth daily. 30 tablet 9    pancrelipase, Lip-Prot-Amyl, (CREON) 24,000-76,000 -120,000 unit CpDR delayed release capsule Take 2 capsules by mouth Three (3) times a day with a meal. Take 2 caps with meals, 1 cap with snacks 210 capsule 9    pantoprazole (PROTONIX) 20 MG tablet TAKE ONE TABLET BY MOUTH EVERY DAY 90 tablet 1    sildenafil (VIAGRA) 100 MG tablet Take 1 tablet (100 mg total) by mouth once as needed for erectile dysfunction. Take one half to one whole tablet 30 minutes prior to sex. 10 tablet 5     Current Facility-Administered Medications   Medication Dose Route Frequency Provider Last Rate Last Admin    triamcinolone acetonide (KENALOG-40) injection 40 mg  40 mg Intra-articular Once Evaristo Bury, [place]           Allergies   Allergen Reactions    Sulfa (Sulfonamide Antibiotics) Hives    Sulfur Hives       Family History   Problem Relation Age of Onset    Hypertension Mother     Diabetes Mother     COPD Father     Pancreatic cancer Neg Hx     Pancreatitis Neg Hx     Melanoma Neg Hx     Basal cell carcinoma Neg Hx     Squamous cell carcinoma Neg Hx        Social History     Tobacco Use    Smoking status: Every Day     Current packs/day: 0.25     Average packs/day: 0.3 packs/day for 30.1 years (7.5 ttl pk-yrs)     Types: Cigarettes     Start  date: 06/28/1993     Passive exposure: Current    Smokeless tobacco: Never    Tobacco comments:     1 pack lasts 4 days; referred to [facility]; Taking nicorette gum   Vaping Use    Vaping status: Never Used   Substance Use Topics    Alcohol use: Not Currently    Drug use: Not Currently     Types: Marijuana     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       Review of Systems  No chest pain SOB, DOE, swelling, paresthesias, vision changes, or skin/feet problems. Remainder of 12-point review of systems was negative except for pertinent items noted in the HPI.     Objective:        PHYSICAL EXAM:   BP 119/63  - Pulse 66  - Wt 65.8 kg (145 lb)  - BMI 24.13 kg/m??   Gen: The patient is awake, alert, conversant and in no apparent distress  Eyes: PERRL, EOMI, sclera non injected, no lid lag or stare  ENT: Normocephalic, atraumatic; mucous membranes moist, oropharynx clear without erythema or exudate.   Neck: No cervical lymphadenopathy  Cardiovascular: Normal rate, regular rhythm. No edema in LE, extremities warm and well perfused.   Resp: Clear to auscultation bilaterally.   Skin: Warm and dry without apparent lesions  Psychiatric: Mood normal, affect appropriate     Lab Review    Lab Results   Component Value Date    NA 139 03/18/2022    K 3.9 03/18/2022    BUN 8 10/16/2022    CREATININE 0.75 10/16/2022    EGFR >90 03/18/2022    CALCIUM 9.6 03/18/2022    ALBUMIN 4.0 03/18/2022    PHOS 4.1 01/24/2014       Lab Results   Component Value Date    CHOL 184 09/09/2022     Lab Results   Component Value Date    LDL 127 (H) 09/09/2022     Lab Results   Component Value Date    HDL 43 09/09/2022     Lab Results   Component Value Date    TRIG 72 09/09/2022     Lab Results   Component Value Date    ALT 16 10/16/2022       Lab Results   Component Value Date    CREATUR 99.3 12/10/2021     Lab Results   Component Value Date    Albumin Quantitative, Urine <0.3 12/10/2021    Albumin/Creatinine Ratio  12/10/2021      Comment:      Unable to calculate.     No results found for: LABCREA, MICROALBQTUR, MSHCGMOM, MALBCRERAT  No results found for: Rayetta Pigg, PCRATIOUR    Lab Results   Component Value Date    VITAMINB12 337 06/20/2017       No results found for: VITDTOTAL    Lab Results   Component Value Date    TSH 0.949 11/13/2021       Radiology    Other Medical Data  Reviewed and summarized records in EPIC/Media and via CareEverywhere x 2 year(s) for purposes of today's visit.    Visit duration:  I personally spent 45 minutes face-to-face and non-face-to-face in the care of this patient, which includes all pre, intra, and post visit time on the date of service.

## 2023-08-06 MED ORDER — EMPAGLIFLOZIN 10 MG TABLET
ORAL_TABLET | Freq: Every day | ORAL | 3 refills | 30.00 days | Status: CP
Start: 2023-08-06 — End: 2024-08-05

## 2023-08-07 ENCOUNTER — Ambulatory Visit: Admit: 2023-08-07 | Payer: MEDICAID

## 2023-08-07 NOTE — Unmapped (Unsigned)
Internal Medicine Initial Visit        Assessment/Plan:     Timothy Tapia presents today to establish care.    {TIP - HCC- RAFF Pilot- Clinical Documentation Specialist Recommendations-  No specialty comments available.   This text will self delete upon signing note:[id number]}       No diagnosis found.    ***    ***    ***    Staffed with Dr. ***, {seen/discussed:75519}    No follow-ups on file.        Chief Complaint:      Timothy Tapia is a 58 y.o. male who presents to Establish Care for ***      Subjective:     HPI  ***      The past medical history, surgical history, family history, social history, medications and allergies were reviewed in Epic.     Review of Systems    The balance of 10 systems was reviewed and unremarkable except as stated above.        Objective:     There were no vitals taken for this visit.     General: ***  HEENT: ***  Eyes: ***  Cardiovascular: ***  Pulmonary: ***  Gastrointestinal: ***  Musculoskeletal: ***  Skin: ***  Back: ***  ***      Records Review ***    PHQ-9 Score:     GAD-7 Score:         Medication adherence and barriers to the treatment plan have been addressed. Opportunities to optimize healthy behaviors have been discussed. Patient / caregiver voiced understanding.

## 2023-08-11 NOTE — Unmapped (Signed)
[facility] Refill Coordination Note    Specialty Medication(s) to be Shipped:   Inflammatory Disorders: Dupixent    Other medication(s) to be shipped: No additional medications requested for fill at this time     Timothy Tapia, DOB: 1966/02/27  Phone: [phone] (home) [phone] (work)      All above HIPAA information was verified with patient.     Was a Nurse, learning disability used for this call? No    Completed refill call assessment today to schedule patient's medication shipment from the [facility] [phone]).  All relevant notes have been reviewed.     Specialty medication(s) and dose(s) confirmed: Regimen is correct and unchanged.   Changes to medications: Timothy Tapia reports no changes at this time.  Changes to insurance: No  New side effects reported not previously addressed with a pharmacist or physician: None reported  Questions for the pharmacist: No    Confirmed patient received a Conservation officer, historic buildings and a Surveyor, mining with first shipment. The patient will receive a drug information handout for each medication shipped and additional FDA Medication Guides as required.       DISEASE/MEDICATION-SPECIFIC INFORMATION        For patients on injectable medications: Patient currently has 1 doses left.  Next injection is scheduled for 2/13.    SPECIALTY MEDICATION ADHERENCE     Medication Adherence    Patient reported X missed doses in the last month: 0  Specialty Medication: dupilumab (DUPIXENT PEN) 300 mg/2 mL PnIj  Patient is on additional specialty medications: No  Informant: patient              Were doses missed due to medication being on hold? No    DUPIXENT PEN 300 mg/2 mL Pnij (dupilumab)  : 2 days of medicine on hand       REFERRAL TO PHARMACIST     Referral to the pharmacist: Not needed      [facility]     Shipping address confirmed in Epic.       Delivery Scheduled: Yes, Expected medication delivery date: 2/14.     Medication will be delivered via Same Day Courier to the prescription address in Epic WAM.    [name]   [facility] Specialty Technician

## 2023-08-15 MED FILL — DUPIXENT 300 MG/2 ML SUBCUTANEOUS PEN INJECTOR: SUBCUTANEOUS | 28 days supply | Qty: 8 | Fill #5

## 2023-08-21 MED ORDER — MECLIZINE 25 MG TABLET
ORAL_TABLET | 5 refills | 0.00 days
Start: 2023-08-21 — End: ?

## 2023-08-21 NOTE — Unmapped (Signed)
 Medication Requested: Lezlie Octave       Future Appointments   Date Time Provider Department Center   10/06/2023  9:20 AM Tamala Fothergill, DO [facility]   11/03/2023  9:00 AM Servando Snare Rolene Course, MD [facility]     Per Provider Note: did not see note    Standing order protocol requirements met?: No    Sent to: Provider for signing    Days Supply Given: 0  Number of Refills: 0

## 2023-08-24 ENCOUNTER — Other Ambulatory Visit: Payer: Self-pay

## 2023-08-24 ENCOUNTER — Emergency Department: Payer: Medicare HMO

## 2023-08-24 ENCOUNTER — Emergency Department
Admission: EM | Admit: 2023-08-24 | Discharge: 2023-08-24 | Disposition: A | Discharge status: Home / Self Care | Payer: Medicare HMO | Attending: Emergency Medicine | Admitting: Emergency Medicine

## 2023-08-24 DIAGNOSIS — J449 Chronic obstructive pulmonary disease, unspecified: Secondary | ICD-10-CM | POA: Diagnosis not present

## 2023-08-24 DIAGNOSIS — Z21 Asymptomatic human immunodeficiency virus [HIV] infection status: Secondary | ICD-10-CM | POA: Insufficient documentation

## 2023-08-24 DIAGNOSIS — M79642 Pain in left hand: Secondary | ICD-10-CM | POA: Diagnosis present

## 2023-08-24 DIAGNOSIS — I10 Essential (primary) hypertension: Secondary | ICD-10-CM | POA: Diagnosis not present

## 2023-08-24 MED ORDER — KETOROLAC TROMETHAMINE 15 MG/ML IJ SOLN
15.0000 mg | Freq: Once | INTRAMUSCULAR | Status: AC
  Administered 2023-08-24: 15 mg via INTRAMUSCULAR
  Filled 2023-08-24: qty 1

## 2023-08-24 MED ORDER — DEXAMETHASONE SODIUM PHOSPHATE 10 MG/ML IJ SOLN
10.0000 mg | Freq: Once | INTRAMUSCULAR | Status: AC
  Administered 2023-08-24: 10 mg via INTRAMUSCULAR
  Filled 2023-08-24: qty 1

## 2023-08-24 NOTE — Unmapped (Addendum)
 Infectious Disease Telephone Encounter    ID General Consult pager pinged at August 24, 2023 3:23 PM for callback. It was noted by Operator this was the 2nd Page, although no prior pages were received on 2/23 regarding Mr Weed.    Attempted to call patient at 3:45 PM without response. Left patient message to call operator back in the case of any emergencies, otherwise to contact the ID Clinic office during regular clinic hours. Dr. Servando Snare, patient's ID provider, will be notified of phone call.    Ardath Sax, MD PhD     [facility] Infectious Disease Fellow - PGY3  [address], [place], [place] [address]       [phone]

## 2023-08-24 NOTE — Unmapped (Signed)
 Infectious Diseases Telephone Note    Received a page that patient was returning a missed call-presumably from Dr. Vella Redhead. Patient reporting pain in his left hand for the past three days that has not improved with Tylenol. He reports he is barely able to move his left hand and is concerned for gout, but reports he has never had gout before. On review of Dr. Annabell Sabal last note the patient does not have hx of gout or other left hand complaints. Encouraged patient to go to urgent care for evaluation as it is difficult to rule out cellulitis or septic arthritis without a physical exam.    Judithann Sauger, MD  Infectious Diseases Fellow

## 2023-08-24 NOTE — Discharge Instructions (Addendum)
 Your evaluated in the ED for left hand pain.  Your x-ray is normal.  We have placed you in a splint and you are encouraged to stay in the splint for 1 week.  Get plenty of rest.  Apply ice to the affected area.  Keep hand elevated above heart level is much as possible.  If symptoms do not improve in 1 week please follow-up with orthopedics.   Alternate taking Tylenol and ibuprofen for pain as needed.

## 2023-08-24 NOTE — ED Provider Notes (Signed)
 [facility] Emergency Department Provider Note     Event Date/Time   First MD Initiated Contact with Patient 08/24/23 1827     (approximate)   History   Hand Pain (Left)   HPI  Mason Martin is a 58 y.o. male with a past medical history of HTN, TIA, HIV, COPD and GERD presents to the ED for evaluation of left hand pain x 1 day.  Patient denies injury.  Patient describes the pain as sharp, constant and non radiating on the palm aspect of his hand. He states this morning the pain got worse.  He has tried Tylenol with no relief.     Physical Exam   Triage Vital Signs: ED Triage Vitals [08/24/23 1727]  Encounter Vitals Group     BP 128/77     Systolic BP Percentile      Diastolic BP Percentile      Pulse Rate 88     Resp 16     Temp 98 F (36.7 C)     Temp Source Oral     SpO2 98 %     Weight      Height 5\' 4"  (1.626 m)     Head Circumference      Peak Flow      Pain Score 10     Pain Loc      Pain Education      Exclude from Growth Chart     Most recent vital signs: Vitals:   08/24/23 1727  BP: 128/77  Pulse: 88  Resp: 16  Temp: 98 F (36.7 C)  SpO2: 98%    General Awake, no distress.  HEENT NCAT. PERRL. EOMI. No rhinorrhea. Mucous membranes are moist.  CV:  Good peripheral perfusion.  RESP:  Normal effort.  ABD:  No distention.  Other:  Left hand reveals no visible deformity.  Tenderness to palpation with over dorsal and ventral aspect.  Neurovascular status intact all throughout.  Brisk capillary refills.  Full range of motion in all digits. Eczema noted on knuckles bilaterally.    ED Results / Procedures / Treatments   Labs (all labs ordered are listed, but only abnormal results are displayed) Labs Reviewed - No data to display  RADIOLOGY  I personally viewed and evaluated these images as part of my medical decision making, as well as reviewing the written report by the radiologist.  ED Provider Interpretation: No  acute bony abnormality noted.  DG Hand Complete Left Result Date: 08/24/2023 CLINICAL DATA:  Atraumatic left hand pain x1 day. EXAM: LEFT HAND - COMPLETE 3+ VIEW COMPARISON:  None Available. FINDINGS: There is no evidence of acute fracture. Mild, likely chronic subluxation of the first metacarpophalangeal joint is noted. There is no evidence of significant arthropathy or other focal bone abnormality. Soft tissues are unremarkable. IMPRESSION: Mild, likely chronic subluxation of the first metacarpophalangeal joint. Electronically Signed   By: Aram Candela M.D.   On: 08/24/2023 17:45    PROCEDURES:  Critical Care performed: No  Procedures   MEDICATIONS ORDERED IN ED: Medications  ketorolac (TORADOL) 15 MG/ML injection 15 mg (15 mg Intramuscular Given 08/24/23 1858)  dexamethasone (DECADRON) injection 10 mg (10 mg Intramuscular Given 08/24/23 1857)     IMPRESSION / MDM / ASSESSMENT AND PLAN / ED COURSE  I reviewed the triage vital signs and the nursing notes.  58 y.o. male presents to the emergency department for evaluation and treatment of left hand pain. See HPI for further details.   Differential diagnosis includes, but is not limited to fracture, dislocation, arthritis, tendinitis, carpal tunnel  Patient's presentation is most consistent with acute complicated illness / injury requiring diagnostic workup.  Patient is alert and oriented. He is hemodynamically stable. Physical exam findings are benign. Xray of left hand is reassuring. Overlying skin is not red or warm to indicate a skin infection. RICE therapy education provided. ED management with toradol and decadron. Wrist brace applied. Encouraged NSAID use at home as needed for pain and close follow up with orthopedics if symptoms do no improve in 1 week. ED return precautions discussed. The patient is in stable condition for discharge home at this time.   FINAL CLINICAL IMPRESSION(S) / ED  DIAGNOSES   Final diagnoses:  Left hand pain   Rx / DC Orders   ED Discharge Orders     None      Note:  This document was prepared using Dragon voice recognition software and may include unintentional dictation errors.    Romeo Apple, Javana Schey A, PA-C 08/24/23 1909    Merwyn Katos, MD 08/25/23 [phone]

## 2023-08-24 NOTE — ED Triage Notes (Signed)
 Pt to ed from home via POV for hand pain in left hand x 1 day. Pt denies any injuries or trauma. Pt is caox4, in no acute distress and ambulatory in triage.

## 2023-08-25 MED ORDER — MECLIZINE 25 MG TABLET
ORAL_TABLET | 5 refills | 0.00 days | Status: CP
Start: 2023-08-25 — End: ?

## 2023-08-26 NOTE — Unmapped (Addendum)
 RN follow-up:  Called patient. He wanted an appointment because his had has been hurting > he went to the ER because it was so bad. They xrayed it and said it was not broken. He was not happy b/c they did not do blood work. I spoke with PA Lahoma Rocker. He said I could open up spot for him on Thursday. Pt voiced understanding.  ----------------------------------------------------------------------------------------------    ---- Message from Timothy Tapia sent at 08/26/2023 10:29 AM EST -----  Incoming Call  Caller: Timothy Tapia  Best callback number: [phone]  Reason for call: Would like a call regarding his hand and him being seen at [facility]. I offered to make an appointment for him to see Dr. Servando Snare but he said he would like for a nurse to call him        Thank you!

## 2023-08-26 NOTE — Unmapped (Deleted)
-----   Message from Allisonia P sent at 08/26/2023 10:29 AM EST -----  Incoming Call  Caller: [name]  Best callback number: [phone]  Reason for call: Would like a call regarding his hand and him being seen at [facility]. I offered to make an appointment for him to see Dr. Servando Snare but he said he would like for a nurse to call him        Thank you!

## 2023-08-28 ENCOUNTER — Ambulatory Visit: Admit: 2023-08-28 | Discharge: 2023-08-29 | Payer: MEDICAID

## 2023-08-28 DIAGNOSIS — B20 Human immunodeficiency virus [HIV] disease: Principal | ICD-10-CM

## 2023-08-28 DIAGNOSIS — M79642 Pain in left hand: Principal | ICD-10-CM

## 2023-08-28 NOTE — Unmapped (Signed)
 Assessment/Plan:      Timothy Tapia, a 58 y.o. male seen today for urgent evaluation of left hand pain.     Plan:    Left hand pain  - 5 day history of left hand pain since Saturday. Seen in the ER 08/24/23. X-ray without any acute findings. He was given a wrist brace and advised RICE. Overall symptoms improving, now with just mild irritation when he doesn't wear his brace. Exam reassuring without any erythema, tenderness, decreased strength.  - Suspect he has carpal tunnel syndrome vs tendinitis.  - Advised to continue to use wrist brace for the next week and to avoid repetitive motions or heavy lifting with left hand (he works as Teacher, English as a foreign language).  - Rest, ice, tylenol (avoids NSAIDs due to history of GERD).   - If symptoms fail to resolve within 1 week, advised he reach out to me and I will refer to Orthopedics.     HIV  Engaged in care with Dr. Orlene Erm  Fills ART via Medicaid.   Lab Results   Component Value Date    ACD4 616 08/13/2021    HIVCP <20 (H) 01/06/2023    HIVRS Detected (A) 01/06/2023     Continue current therapy.  No labs today.  Encouraged continued excellent ARV adherence.    Immunization History   Administered Date(s) Administered    COVID-19 VACC,MRNA,(PFIZER)(PF) 11/18/2019, 11/18/2019, 12/09/2019, 12/09/2019, 06/12/2020    INFLUENZA TIV (TRI) PF (IM)(HISTORICAL) 04/27/2007, 05/10/2008, 04/03/2009, 04/02/2010, 04/19/2011, 03/30/2012    Influenza Vaccine Quad(IM)6 MO-Adult(PF) 04/02/2013, 05/03/2014, 03/20/2015    PNEUMOCOCCAL POLYSACCHARIDE 23-VALENT 08/16/2002, 08/16/2002, 11/30/2007    PPD Test 04/27/2007, 04/26/2008, 04/03/2009, 07/20/2010    Pneumococcal Conjugate 13-Valent 07/27/2012    SHINGRIX-ZOSTER VACCINE (HZV),RECOMBINANT,ADJUVANTED(IM) 10/07/2018    TD(TDVAX),ADSORBED,2LF(IM)(PF) 12/25/2015    TdaP 03/17/2006, 03/17/2006     Disposition  Return to clinic as scheduled with Dr. Servando Snare in May, or sooner if needed.    Lahoma Rocker, PA-C  [facility]   [address]  [place], [place].  [address]  Phone: [phone]   Fax: [phone]     I personally spent 18 minutes face-to-face and non-face-to-face in the care of this patient, which includes all pre, intra, and post visit time on the date of service.  All documented time was specific to the E/M visit and does not include any procedures that may have been performed.         Subjective:      Chief Complaint   Left hand pain.    HPI  Urgent visit for Timothy Tapia, a 58 y.o. male presenting for left hand pain.  He is a 58 yo man with a history of HIV, HTN, TIA, COPD, GERD.    On Saturday, he began having left hand pain. It started suddenly. He denies any falls, trauma to hand. 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  He was seen in the ED on 2/23 due to left hand pain.  X-ray without any acute fracture. Mild, chronic subluxation of firt metacarpophalangeal joint.   He was given ketorolac and dexamethasone IM in the ER. Advised to use NSAIDs.  He is wearing a wrist brace daily and reports the pain has improved. He took it off yesterday, and notes today a little irritation of his left palm. Denies any tingling, weakness, rash. He's using tylenol as well for pain.  -  He takes Prezcobix and Tivicay.     Past Medical History:   Diagnosis Date    Acute pancreatitis     Barrett esophagus     Black stool 07/08/2017    Bronchitis     Colon polyp     Eczema     Gastroesophageal reflux disease     HIV disease (CMS-HCC)     Hypertension     Lack of access to transportation     Pancreatic pseudocyst     Thrombus     IMV thrombus r/t colitis & pancreatitis 10/07/13    Uncircumcised male 05/30/2022    Visual impairment      Medications and Allergies   Reviewed and updated today. See bottom of this visit's encounter summary for details.  Current Outpatient Medications on File Prior to Visit   Medication Sig    albuterol HFA 90 mcg/actuation inhaler Inhale 2 puffs by mouth prior to study CT scan    amlodipine (NORVASC) 5 MG tablet Take 1 tablet (5 mg total) by mouth daily.    atorvastatin (LIPITOR) 20 MG tablet Take 1 tablet by mouth once daily    betamethasone, augmented, (DIPROLENE) 0.05 % ointment Apply twice daily to active areas until smooth.    blood sugar diagnostic (GLUCOSE BLOOD) Strp Check blood sugar as directed once a day and for symptoms of high or low blood sugar.    blood-glucose meter kit Use as directed to test blood sugar.  Disp. blood glucose meter kit preferred by patient's insurance. Dx: Diabetes, E11.9    cetirizine (ZYRTEC) 10 MG tablet Take 1 tablet (10 mg total) by mouth daily.    clobetasol (TEMOVATE) 0.05 % ointment Apply topically two (2) times a day. To active areas until smooth    clotrimazole (MYCELEX) 10 mg troche Take 1 tablet (10 mg total) by mouth five (5) times a day.    darunavir-cobicistat (PREZCOBIX) 800-150 mg-mg tablet TAKE ONE TABLET BY MOUTH DAILY    darunavir-cobicistat (PREZCOBIX) 800-150 mg-mg tablet Take 1 tablet by mouth daily.    dolutegravir (TIVICAY) 50 mg TABLET TAKE ONE TABLET BY MOUTH DAILY    dolutegravir (TIVICAY) 50 mg TABLET Take 1 tablet (50 mg total) by mouth daily.    dupilumab (DUPIXENT PEN) 300 mg/2 mL PnIj Inject the contents of 1 pen (300 mg total) under the skin every seven (7) days.    empagliflozin (JARDIANCE) 10 mg tablet Take 1 tablet (10 mg total) by mouth daily.    empty container Misc Use as directed to dispose of Dupixent pens.    enalapril (VASOTEC) 20 MG tablet TAKE ONE TABLET BY MOUTH EVERY DAY    gabapentin (NEURONTIN) 300 MG capsule Take 3 tabs by mouth twice a day    lancets Misc Check blood sugar as directed once a day and for symptoms of high or low blood sugar.    meclizine (ANTIVERT) 25 mg tablet TAKE ONE TABLET BY MOUTH THREE TIMES DAILY AS NEEDED FOR dizziness.    metFORMIN (GLUCOPHAGE-XR) 500 MG 24 hr tablet Take 1 tablet (500 mg) by mouth daily with breakfast for 7 days, THEN 2 tablets (1,000 mg total) daily with breakfast for 7 days, THEN 2 tablets (1,000 mg total) twice daily with breakfast and dinner thereafter.    mirtazapine (REMERON) 30 MG tablet Take 1 tablet (30 mg total) by mouth daily.    pancrelipase, Lip-Prot-Amyl, (CREON) 24,000-76,000 -120,000 unit CpDR delayed release capsule Take 2 capsules by mouth Three (3) times a day  with a meal. Take 2 caps with meals, 1 cap with snacks    pantoprazole (PROTONIX) 20 MG tablet TAKE ONE TABLET BY MOUTH EVERY DAY    sildenafil (VIAGRA) 100 MG tablet Take 1 tablet (100 mg total) by mouth once as needed for erectile dysfunction. Take one half to one whole tablet 30 minutes prior to sex.    beclomethasone (BECONASE AQ) 42 mcg (0.042 %) nasal spray Place 2 sprays into each nostril two (2) times a day.     Current Facility-Administered Medications on File Prior to Visit   Medication    triamcinolone acetonide (KENALOG-40) injection 40 mg       Allergies   Allergen Reactions    Sulfa (Sulfonamide Antibiotics) Hives    Sulfur Hives       Social History  Social History     Tobacco Use    Smoking status: Every Day     Current packs/day: 0.25     Average packs/day: 0.3 packs/day for 30.2 years (7.5 ttl pk-yrs)     Types: Cigarettes     Start date: 06/28/1993     Passive exposure: Current    Smokeless tobacco: Never    Tobacco comments:     1 pack lasts 4 days; referred to [facility] Quit[facility]; Taking nicorette gum   Substance Use Topics    Alcohol use: Not Currently       Review of Systems  As per HPI. Remainder of 10 systems reviewed, negative.        Objective:      BP 106/65 (BP Site: L Arm, BP Position: Sitting, BP Cuff Size: Large)  - Pulse 71  - Temp 36.2 ??C (97.1 ??F)  - Wt 67.9 kg (149 lb 12.8 oz)  - BMI 24.93 kg/m??     Physical Exam  Vitals reviewed.   Constitutional:       Appearance: Normal appearance.   Cardiovascular:      Rate and Rhythm: Normal rate and regular rhythm.   Pulmonary:      Effort: Pulmonary effort is normal.      Breath sounds: Normal breath sounds.   Musculoskeletal:      Right hand: Normal.      Left hand: No tenderness or bony tenderness. Swelling: very mild swelling of thenar eminence.Normal range of motion. Normal strength. Normal sensation. There is no disruption of two-point discrimination. Normal capillary refill. Normal pulse.   Neurological:      Mental Status: He is alert.   Psychiatric:         Mood and Affect: Mood normal.         Behavior: Behavior normal.         Thought Content: Thought content normal.         Judgment: Judgment normal.         Laboratory Data  Reviewed in Epic today, using Synopsis and Chart Review filters.    Lab Results   Component Value Date    CREATININE 0.75 10/16/2022    QFTTBGOLD Negative 12/25/2015    QTBG NEGATIVE 09/23/2011    HEPCAB Nonreactive 03/23/2019    CHOL 184 09/09/2022    HDL 43 09/09/2022    LDL 127 (H) 09/09/2022    NONHDL 141 (H) 09/09/2022    TRIG 72 09/09/2022    PSA 0.58 01/06/2023    A1C >14.0 (H) 06/17/2023    FINALDX  09/11/2022     A: Foreskin, circumcision  - Benign skin with mild subepithelial chronic inflammation  -  No evidence of malignancy     This electronic signature is attestation that the pathologist personally reviewed the submitted material(s) and the final diagnosis reflects that evaluation.       XR Hand 3 Or More Views Left  Order: [id number]  Narrative    CLINICAL DATA:  Atraumatic left hand pain x1 day.    EXAM:  LEFT HAND - COMPLETE 3+ VIEW    COMPARISON:  None Available.    FINDINGS:  There is no evidence of acute fracture. Mild, likely chronic  subluxation of the first metacarpophalangeal joint is noted. There  is no evidence of significant arthropathy or other focal bone  abnormality. Soft tissues are unremarkable.    IMPRESSION:  Mild, likely chronic subluxation of the first metacarpophalangeal  joint.      Electronically Signed    By: Aram Candela M.D.    On: 08/24/2023 17:45

## 2023-08-28 NOTE — Unmapped (Signed)
 irritation in rt hand statuary past Saturday xray's done no finding pt reports unable to make a fist.

## 2023-09-05 NOTE — Unmapped (Signed)
 Referral Services Note     Duration of Intervention: 15 minutes    TYPE OF CONTACT: Inbasket with [facility] Team/Nursing:    ASSESSMENT: SW was contacted by the [facility] team regarding care coordination. SW was informed that the patient is now diabetic and he wanted to know where he could have his toe nails cut.      INTERVENTION:  SW informed the [facility] team member that it appears that the patient may need a referral to a  podiatrist. SW also informed the staff member that the patient's RW renewal was incomplete. As of 09/05/23, the patient is not eligible for any funds through RW.Marland Kitchen     PLAN:  Patient will be contacted by the Nursing staff for further assistance with the referral process.     ADDITIONAL INFORMATION: Patient has Medicare and Medicaid. If the patient has a transportation barrier Medicaid will not transport him outside of [facility]. The client is not eligible to receive Juanell Fairly services as of : 09/05/2023    Sofie Rower, LCSW-A  [facility] Social Work

## 2023-09-05 NOTE — Unmapped (Signed)
 Patient called because he wanted to know if  [facility] is still in network with [facility]. Confirmed that they are.    Name: Timothy Tapia  Date: 09/05/2023  Address: [address]  [place] [address]   [place] of Residence:  [facility]  Phone: [phone]     Started assessment with patient options: in clinic    Is this the same address for mailing? Yes  If no, Mailing Address is:     What is your preferred method of contact? Phone Call    Is there anyone that you would want to add as your personal contact? No; if yes, please use SmartPhrase RWPersonalContactInfo to gather their contacts information.    N/A    Housing Status  Stable/Permanent; if so, what is their housing type: Renting and living - room, house, or apartment    Insurance  Medicaid and Medicare Part C    Marital Status  Single    Tax Press photographer Status  Single    Employment Status  Employed Part Time     Income  Salary/Wages and Social Security (Retirement/Survivor's/Disability)    If no or low income, how are you meeting your basic needs?  Food Stamps/EBT    List Tax Household Members including relationship to you:   N/A    Someone in my household receives: Not Applicable (for household members)  Specify who: N/A    Do you or anyone in your home have income adjustments? No    If yes, which adjustments do you have? N/A      Medication Access/Barriers: no, insured    Do you have a current diagnosis for Hepatitis C?  Lab Results   Component Value Date    HEPCAB Nonreactive 03/23/2019       Federal Marketplace Eligibility Assessment  Patient has affordable insurance through [employer], [place], and or Employment and is not eligible.    Patient given ACA education if they qualified based on answers to questions above.     MyChart  Do you have an active MyChart account? Yes     If MyChart is not set up, informed patient on how to set up MyChart N/A    Patient was informed of the following programs;   N/A    The following applications/handouts were given to patient: N/A    The following forms were also started with the patient:   N/A    Medicaid:  N/A      Ryan White/HMAP application status: Incomplete; patient needs to send proof of income (ssi letter and 2 paystubs)    Patient is applying for [employer] on Charges Only     Additional Comments: Patient states he will bring in his income in person.    ____________________________________________________________________    The patient provides verbal consent and agrees to the following:    RW Consent  I agree to notify the interviewer within 30 days about any changes to my address, financial resources, expenses, family situation, or health insurance coverage that may impact my eligibility for Department payment programs. I certify that the information I have provided is accurate and complete to the best of my knowledge. I understand that information provided may be verified by a state reviewer, and I agree to submit any necessary financial records required for this review. I also understand that my employer may be asked to verify information concerning my income.    Caps on Charges Consent  The [employer] Program requires that both insured and uninsured individuals  be charged no more than a specified maximum amount in a calendar year, based on their individual income.           [name] Asper,  Benefits & Eligibility Coordinator  Time of Intervention: 5 minutes

## 2023-09-10 DIAGNOSIS — L309 Dermatitis, unspecified: Principal | ICD-10-CM

## 2023-09-10 MED ORDER — DUPIXENT 300 MG/2 ML SUBCUTANEOUS PEN INJECTOR
SUBCUTANEOUS | 2 refills | 28.00 days | Status: CP
Start: 2023-09-10 — End: ?
  Filled 2023-09-15: qty 8, 28d supply, fill #0

## 2023-09-10 NOTE — Unmapped (Signed)
 Refill for Dupixent, pt also needs a follow up appt, attached call center

## 2023-09-10 NOTE — Unmapped (Signed)
 [facility] Refill Coordination Note    Specialty Medication(s) to be Shipped:   Inflammatory Disorders: Dupixent    Other medication(s) to be shipped: No additional medications requested for fill at this time     Timothy Tapia, DOB: [birth year]  Phone: [phone] (home) [phone] (work)      All above HIPAA information was verified with patient.     Was a Nurse, learning disability used for this call? No    Completed refill call assessment today to schedule patient's medication shipment from the [facility]  [phone]).  All relevant notes have been reviewed.     Specialty medication(s) and dose(s) confirmed: Regimen is correct and unchanged.   Changes to medications: Darryle reports starting the following medications: empagliflozin (JARDIANCE) 10 mg tablet   Changes to insurance: No  New side effects reported not previously addressed with a pharmacist or physician: None reported  Questions for the pharmacist: No    Confirmed patient received a Conservation officer, historic buildings and a Surveyor, mining with first shipment. The patient will receive a drug information handout for each medication shipped and additional FDA Medication Guides as required.       DISEASE/MEDICATION-SPECIFIC INFORMATION        For patients on injectable medications: Patient currently has 1 doses left.  Next injection is scheduled for 3/13.    SPECIALTY MEDICATION ADHERENCE     Medication Adherence    Patient reported X missed doses in the last month: 0  Specialty Medication: dupilumab (DUPIXENT PEN) 300 mg/2 mL PnIj  Patient is on additional specialty medications: No  Informant: patient              Were doses missed due to medication being on hold? No    dupilumab (DUPIXENT PEN) 300 mg/2 mL PnIj : 1 doses of medicine on hand       REFERRAL TO PHARMACIST     Referral to the pharmacist: Not needed      [facility]     Shipping address confirmed in Epic.     Cost and Payment:  Unable to determine copay at this time as there are no refills remaining on prescription. Last copayment was $0.    Delivery Scheduled: Yes, Expected medication delivery date: 3/17.  However, Rx request for refills was sent to the provider as there are none remaining.     Medication will be delivered via Same Day Courier to the prescription address in Epic WAM.    [facility]

## 2023-09-10 NOTE — Unmapped (Signed)
 Called, no answer  Text message sent    Thanks  Toni Amend

## 2023-09-15 DIAGNOSIS — E1165 Type 2 diabetes mellitus with hyperglycemia: Principal | ICD-10-CM

## 2023-09-15 MED ORDER — ACCU-CHEK GUIDE TEST STRIPS
0 refills | 0.00 days
Start: 2023-09-15 — End: ?

## 2023-09-16 DIAGNOSIS — E1165 Type 2 diabetes mellitus with hyperglycemia: Principal | ICD-10-CM

## 2023-09-16 MED ORDER — ACCU-CHEK GUIDE TEST STRIPS
0 refills | 0.00 days
Start: 2023-09-16 — End: ?

## 2023-09-17 DIAGNOSIS — E1165 Type 2 diabetes mellitus with hyperglycemia: Principal | ICD-10-CM

## 2023-09-17 MED ORDER — BLOOD GLUCOSE TEST STRIPS
ORAL_STRIP | 0 refills | 0.00 days | Status: CP
Start: 2023-09-17 — End: ?

## 2023-09-17 NOTE — Unmapped (Signed)
 Test strip refill request sent per protocol

## 2023-09-19 DIAGNOSIS — E1165 Type 2 diabetes mellitus with hyperglycemia: Principal | ICD-10-CM

## 2023-09-19 MED ORDER — BLOOD GLUCOSE TEST STRIPS
ORAL_STRIP | 3 refills | 0.00 days | Status: CP
Start: 2023-09-19 — End: ?

## 2023-09-23 DIAGNOSIS — E1165 Type 2 diabetes mellitus with hyperglycemia: Principal | ICD-10-CM

## 2023-09-23 MED ORDER — LANCETS
0 refills | 0 days
Start: 2023-09-23 — End: ?

## 2023-09-25 DIAGNOSIS — E1165 Type 2 diabetes mellitus with hyperglycemia: Principal | ICD-10-CM

## 2023-09-25 MED ORDER — LANCETS
0 refills | 0 days
Start: 2023-09-25 — End: ?

## 2023-09-25 NOTE — Unmapped (Signed)
-----   Message from McCurtain F sent at 09/25/2023 11:30 AM EDT -----  The PAC has received an incoming call requesting medication refill/clarification/question:    Caller: Timothy Tapia    Type of request (refill, clarification, question): refill  Name of medication: lancets  (He can't get internal medicine to return his calls about filling this)    Desired pharmacy:   [facility] [address] ([address]), [place] - [address]  [address] [place] ([place]) [place] [address]  Phone: [phone] Fax: [phone]

## 2023-10-04 DIAGNOSIS — K863 Pseudocyst of pancreas: Principal | ICD-10-CM

## 2023-10-04 DIAGNOSIS — I10 Essential (primary) hypertension: Principal | ICD-10-CM

## 2023-10-04 MED ORDER — MIRTAZAPINE 30 MG TABLET
ORAL_TABLET | Freq: Every day | ORAL | 9 refills | 0.00 days
Start: 2023-10-04 — End: ?

## 2023-10-04 MED ORDER — AMLODIPINE 5 MG TABLET
ORAL_TABLET | Freq: Every day | ORAL | 9 refills | 0.00 days
Start: 2023-10-04 — End: ?

## 2023-10-04 MED ORDER — CREON 24,000-76,000-120,000 UNIT CAPSULE,DELAYED RELEASE
ORAL_CAPSULE | 9 refills | 0.00 days
Start: 2023-10-04 — End: ?

## 2023-10-06 ENCOUNTER — Ambulatory Visit
Admit: 2023-10-06 | Attending: Student in an Organized Health Care Education/Training Program | Primary: Student in an Organized Health Care Education/Training Program

## 2023-10-06 MED ORDER — AMLODIPINE 5 MG TABLET
ORAL_TABLET | Freq: Every day | ORAL | 9 refills | 30.00 days | Status: CP
Start: 2023-10-06 — End: ?

## 2023-10-06 MED ORDER — MIRTAZAPINE 30 MG TABLET
ORAL_TABLET | Freq: Every day | ORAL | 9 refills | 30.00 days | Status: CP
Start: 2023-10-06 — End: ?

## 2023-10-06 MED ORDER — CREON 24,000-76,000-120,000 UNIT CAPSULE,DELAYED RELEASE
ORAL_CAPSULE | 9 refills | 0.00 days | Status: CP
Start: 2023-10-06 — End: ?

## 2023-10-06 NOTE — Unmapped (Unsigned)
 [facility] [address]  [address]  [place] [address]  Phone: [phone]  Fax: [phone]    Assessment/Plan:     Problem List Items Addressed This Visit    None      Timothy Tapia is a 58 y.o. male who presents for f/u of Type 2 diabetes mellitus. With their age, complications and current regimen an appropriate A1c goal is ***. They are currently *** this goal.     Assessment & Plan      Monitoring for diabetes complications:  Renal: Last albuminuria screen and serum creatinine check  Retinal: Last dilated eye exam  Neurological: Last foot exam   CVD:   BP    Lipids   Fib-4     No follow-ups on file.    PCP: Marie Shone, MD    CC: Follow up Type 2 Diabetes    Subjective:    Timothy Tapia is a 58 y.o. male with PMH HTN, HIV on ART, substance use disorder, acute pancreatitis who presents for f/u of Type 2 diabetes mellitus.  Last seen 2 months ago.    Diabetes History:   - Patient unclear regarding initial diagnosis, but diagnosed at least 2021. Well controlled for several years.   - History notable for multiple episodes of acute pancreatitis related to alcohol use (2952,8413). Denies resulting diabetes but has required chronic creon use.   - Abrupt worsening of hyperglycemia at the end of 2024 for unclear reasons. Denies substance use during this time. Challenges with regular soda intake but has stopped in recent months.      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 No known ulcers or foot wounds.   No known retinopathy or nephropathy.      Diet:   - Reports challenges with sweet cravings since being sober. High use of regular soda in 2024, has switched to low or zero sugar alternatives in the new year.      Exercise   - Limited by chronic back pain.      Social History:   - Current smoker, 0.3 ppd.   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     Interim history notable for ***.  History of Present Illness      Currently taking ***    Glucose data downloaded and reviewed as displayed in media and below. ***     Lab Results   Component Value Date    A1C >14.0 (H) 06/17/2023    A1C 8.0 (H) 01/06/2023    A1C 5.8 (H) 09/09/2022    A1C 5.8 (H) 05/20/2022       Wt Readings from Last 3 Encounters:   08/28/23 67.9 kg (149 lb 12.8 oz)   08/05/23 65.8 kg (145 lb)   06/17/23 72.1 kg (159 lb)       Past Medical History:   Diagnosis Date    Acute pancreatitis     Barrett esophagus     Black stool 07/08/2017    Bronchitis     Colon polyp     Eczema     Gastroesophageal reflux disease     HIV disease     Hypertension     Lack of access to transportation     Pancreatic pseudocyst     Thrombus     IMV thrombus r/t colitis & pancreatitis 10/07/13  Uncircumcised male 05/30/2022    Visual impairment         The patient's past medical history, medications, and drug allergies were reviewed and, as needed, updated as part of this visit.    Review of Systems  ***No chest pain, SOB, DOE, swelling, vision changes, or paresthesias. No feet concerns. No new skin problems.      Objective:        There were no vitals taken for this visit.    General appearance:  Alert, pleasant, in no distress    Eyes:  No stare or lid edema. Sclera anicteric.       Neck:  supple,  trachea midline, no cervical adenopathy   Lung: No increased work of breathing, clear to auscultation bilaterally   Heart:  regular rate and rhythm   Extremities: No LE edema   Skin: warm and dry, no rashes, ***sites clean   Pulses: 2+ and symmetric.   Neuro: No focal deficits, no hand tremors, gait stable   FEET Updated ***. Ulcers/sores ***, monofilament ***      Lab Review    Lab Results   Component Value Date    NA 139 03/18/2022    K 3.9 03/18/2022    CL 105 03/18/2022    CO2 20.0 10/16/2022    BUN 8 10/16/2022    CREATININE 0.75 10/16/2022    GFRNONAA 99 05/13/2019    GFRAA 115 05/13/2019    CALCIUM 9.6 03/18/2022    ALBUMIN 4.0 03/18/2022    PHOS 4.1 01/24/2014       Lab Results   Component Value Date    CHOL 184 09/09/2022     Lab Results   Component Value Date    LDL 127 (H) 09/09/2022     Lab Results   Component Value Date    HDL 43 09/09/2022     Lab Results   Component Value Date    TRIG 72 09/09/2022     Lab Results   Component Value Date    ALT 16 10/16/2022       Lab Results   Component Value Date    CREATUR 21.1 08/05/2023     Lab Results   Component Value Date    Albumin Quantitative, Urine <0.3 08/05/2023    Albumin/Creatinine Ratio  08/05/2023      Comment:      Unable to calculate.     No results found for: LABCREA, MICROALBQTUR, MSHCGMOM, MALBCRERAT  No results found for: Ted Favor, PCRATIOUR    Lab Results   Component Value Date    TSH 0.949 11/13/2021         Radiology Review      Other Medical Data

## 2023-10-06 NOTE — Unmapped (Signed)
 Medication Requested: mirtazapine. Amlodipine, creon       Future Appointments   Date Time Provider Department Center   11/03/2023  9:00 AM Ole Berkeley, Ellison Ha, MD [id number]     Per Provider Note:   This medication is included in this patients clinical summary.    Standing order protocol requirements met?: No     Sent to: Provider for signing    Days Supply Given: TBD by provider   Number of Refills: TBD by provider

## 2023-10-09 NOTE — Unmapped (Signed)
 [facility] Refill Coordination Note    Specialty Medication(s) to be Shipped:   Inflammatory Disorders: Dupixent    Other medication(s) to be shipped: No additional medications requested for fill at this time     Timothy Tapia, DOB: March 04, 1966  Phone: [phone] (home) [phone] (work)      All above HIPAA information was verified with patient.     Was a Nurse, learning disability used for this call? No    Completed refill call assessment today to schedule patient's medication shipment from the [facility]  [phone]).  All relevant notes have been reviewed.     Specialty medication(s) and dose(s) confirmed: Regimen is correct and unchanged.   Changes to medications: Symir reports no changes at this time.  Changes to insurance: No  New side effects reported not previously addressed with a pharmacist or physician: None reported  Questions for the pharmacist: No    Confirmed patient received a Conservation officer, historic buildings and a Surveyor, mining with first shipment. The patient will receive a drug information handout for each medication shipped and additional FDA Medication Guides as required.       DISEASE/MEDICATION-SPECIFIC INFORMATION        For patients on injectable medications: Patient currently has 1 doses left.  Next injection is scheduled for 4/10.    SPECIALTY MEDICATION ADHERENCE     Medication Adherence    Patient reported X missed doses in the last month: 0  Specialty Medication: dupilumab (DUPIXENT PEN) 300 mg/2 mL PnIj  Patient is on additional specialty medications: No              Were doses missed due to medication being on hold? No    Dupixent 300/2 mg/ml: 1 doses of medicine on hand        REFERRAL TO PHARMACIST     Referral to the pharmacist: Not needed      [facility]     Shipping address confirmed in Epic.     Cost and Payment: Patient has a $0 copay, payment information is not required.    Delivery Scheduled: Yes, Expected medication delivery date: 10/14/23.     Medication will be delivered via Same Day Courier to the prescription address in Epic [place].    Canary Ceo   [facility]  Specialty Technician

## 2023-10-13 DIAGNOSIS — L309 Dermatitis, unspecified: Principal | ICD-10-CM

## 2023-10-14 MED FILL — DUPIXENT 300 MG/2 ML SUBCUTANEOUS PEN INJECTOR: SUBCUTANEOUS | 28 days supply | Qty: 8 | Fill #1

## 2023-10-19 ENCOUNTER — Other Ambulatory Visit: Payer: Self-pay

## 2023-10-19 ENCOUNTER — Emergency Department: Admission: EM | Admit: 2023-10-19 | Discharge: 2023-10-19 | Disposition: A | Discharge status: Home / Self Care

## 2023-10-19 DIAGNOSIS — N3001 Acute cystitis with hematuria: Secondary | ICD-10-CM | POA: Diagnosis not present

## 2023-10-19 DIAGNOSIS — I10 Essential (primary) hypertension: Secondary | ICD-10-CM | POA: Insufficient documentation

## 2023-10-19 DIAGNOSIS — Z21 Asymptomatic human immunodeficiency virus [HIV] infection status: Secondary | ICD-10-CM | POA: Insufficient documentation

## 2023-10-19 DIAGNOSIS — J302 Other seasonal allergic rhinitis: Secondary | ICD-10-CM | POA: Insufficient documentation

## 2023-10-19 DIAGNOSIS — R300 Dysuria: Secondary | ICD-10-CM | POA: Diagnosis present

## 2023-10-19 HISTORY — DX: Type 2 diabetes mellitus without complications: E11.9

## 2023-10-19 LAB — URINALYSIS, ROUTINE W REFLEX MICROSCOPIC
Bilirubin Urine: NEGATIVE
Glucose, UA: NEGATIVE mg/dL
Ketones, ur: NEGATIVE mg/dL
Nitrite: NEGATIVE
Protein, ur: >=300 mg/dL — AB
RBC / HPF: >50 RBC/hpf (ref 0–5)
Specific Gravity, Urine: 1.022 (ref 1.005–1.030)
Squamous Epithelial / HPF: 0 /HPF (ref 0–5)
pH: 7 (ref 5.0–8.0)

## 2023-10-19 LAB — CHLAMYDIA/NGC RT PCR (ARMC ONLY)
Chlamydia Tr: NOT DETECTED
N gonorrhoeae: NOT DETECTED

## 2023-10-19 MED ORDER — CEPHALEXIN 500 MG PO CAPS: 500.0000 mg | ORAL_CAPSULE | Freq: Four times a day (QID) | ORAL | 0 refills | Status: DC

## 2023-10-19 MED ORDER — CEPHALEXIN 500 MG PO CAPS
500.0000 mg | ORAL_CAPSULE | Freq: Once | ORAL | Status: AC
  Administered 2023-10-19: 500 mg via ORAL
  Filled 2023-10-19: qty 1

## 2023-10-19 MED ORDER — CEPHALEXIN 500 MG PO CAPS: 500.0000 mg | ORAL_CAPSULE | Freq: Four times a day (QID) | ORAL | 0 refills | Status: AC

## 2023-10-19 NOTE — Discharge Instructions (Addendum)
 Take antibiotics as prescribed.  Your urinalysis is positive for a urinary tract infection.  Follow-up with your primary care as needed.  Pick up eyedrops over-the-counter with stated antihistamine for eye irritation.

## 2023-10-19 NOTE — ED Triage Notes (Signed)
 Pt to ED with wife for dysuria, frequency, urgency since yesterday. Also having allergies and runny eyes and has been taking zyrtec allegra etc. Denies hematuria. Urine is dark. Denies flank pain.

## 2023-10-19 NOTE — ED Notes (Signed)
 Pt discharge information reviewed. Pt understands importance for need of follow up care, and when to return if symptoms worsen.  Pt understands all information. All questions answered. Pt seen ambulating out of department with strong steady gait with family.

## 2023-10-19 NOTE — ED Provider Notes (Addendum)
 [facility] Emergency Department Provider Note     Event Date/Time   First MD Initiated Contact with Patient 10/19/23 1745     (approximate)   History   Dysuria   HPI  Mason Martin is a 58 y.o. male with a history of HTN and HIV presents to the ED for 1 day of dysuria, urgency and frequency.  He was advised by his ID doctor to come to the ED for urinalysis.  He declines abdominal pain, flank pain, nausea and fever.  Denies penile discharge and sexual activity.  Of note patient reports allergies and eye irritation.  He has been trying Zyrtec and Visine with minimal relief.    Physical Exam   Triage Vital Signs: ED Triage Vitals  Encounter Vitals Group     BP 10/19/23 1647 132/76     Systolic BP Percentile --      Diastolic BP Percentile --      Pulse Rate 10/19/23 1647 92     Resp 10/19/23 1647 20     Temp 10/19/23 1647 98.5 F (36.9 C)     Temp Source 10/19/23 1647 Oral     SpO2 10/19/23 1647 100 %     Weight 10/19/23 1651 158 lb (71.7 kg)     Height 10/19/23 1651 5\' 4"  (1.626 m)     Head Circumference --      Peak Flow --      Pain Score 10/19/23 1648 0     Pain Loc --      Pain Education --      Exclude from Growth Chart --     Most recent vital signs: Vitals:   10/19/23 1647 10/19/23 1953  BP: 132/76   Pulse: 92 81  Resp: 20   Temp: 98.5 F (36.9 C)   SpO2: 100% 96%    General Awake, no distress.  HEENT NCAT. EOMI. Tearing eyes, injected sclera bilaterally.  No drainage. CV:  Good peripheral perfusion.  RESP:  Normal effort.  ABD:  No distention.  Soft, nontender.  Negative CVA tenderness bilaterally.  ED Results / Procedures / Treatments   Labs (all labs ordered are listed, but only abnormal results are displayed) Labs Reviewed  URINALYSIS, ROUTINE W REFLEX MICROSCOPIC - Abnormal; Notable for the following components:      Result Value   Color, Urine YELLOW (*)    APPearance CLOUDY (*)    Hgb urine dipstick LARGE  (*)    Protein, ur >=300 (*)    Leukocytes,Ua MODERATE (*)    Bacteria, UA RARE (*)    All other components within normal limits  CHLAMYDIA/NGC RT PCR ([facility] ONLY)            URINE CULTURE   No results found.  PROCEDURES:  Critical Care performed: No  Procedures   MEDICATIONS ORDERED IN ED: Medications  cephALEXin  (KEFLEX ) capsule 500 mg (500 mg Oral Given 10/19/23 1951)     IMPRESSION / MDM / ASSESSMENT AND PLAN / ED COURSE  I reviewed the triage vital signs and the nursing notes.                              Clinical Course as of 10/19/23 2127  Sun Oct 19, 2023  1844 Mickiel AlbanyAaron Aas): MODERATE [MH]    Clinical Course User Index [MH] Alvaro Augusta A, PA-C   58 y.o. male presents to the emergency department for evaluation  and treatment of urinary frequency. See HPI for further details.   Differential diagnosis includes, but is not limited to UTI, pyelonephritis, allergies  Patient's presentation is most consistent with acute complicated illness / injury requiring diagnostic workup.  Patient is alert and oriented.  He is hemodynamically stable and afebrile.  No red flag signs.  Urinalysis is consistent with a UTI.  First dose of Keflex  provided in the ED. urine culture pending.  Encouraged to follow-up with primary care for ongoing symptoms.  Patient is stable condition for discharge home.  ED return precautions discussed   FINAL CLINICAL IMPRESSION(S) / ED DIAGNOSES   Final diagnoses:  Acute cystitis with hematuria  Seasonal allergies   Rx / DC Orders   ED Discharge Orders          Ordered    cephALEXin  (KEFLEX ) 500 MG capsule  4 times daily,   Status:  Discontinued        10/19/23 1944    cephALEXin  (KEFLEX ) 500 MG capsule  4 times daily        10/19/23 1955           Note:  This document was prepared using Dragon voice recognition software and may include unintentional dictation errors.    Phyllis Breeze, Evie Croston A, PA-C 10/19/23 2128    Phyllis Breeze, Mecca Barga  A, PA-C 10/19/23 2128    Collis Deaner, MD 10/20/23 [phone]

## 2023-10-22 LAB — URINE CULTURE: Culture: >=100000 — AB

## 2023-10-28 NOTE — Unmapped (Signed)
 Incoming fax from [employer]. Pt has received a temporary supply of Dupixent. Letter scanned into media tab. Pt due for a follow up appointment. Message sent to call center for scheduling

## 2023-10-30 NOTE — Unmapped (Signed)
 Received fax stating PA for DUpixent has been approved and is good until 04/23/2024. Approval letter to be scanned into patients chart. Pt was informed via MyChart.

## 2023-11-02 NOTE — Unmapped (Addendum)
 For your dermatitis:     Inject the contents of 1 Dupilumab pen (300 mg) under the skin every week.    Apply Opzelura 1.5% cream twice a day to active areas on the weekdays.     Apply clobetasol 0.05% ointment twice a day to active areas on the weekends.      Eventually may switch to Opzelura alone,

## 2023-11-02 NOTE — Unmapped (Signed)
 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 Biopsy 03/2020 with Epidermal acanthosis with focal spongiosis and prominent papillary dermal fibrosis. Previously discussed opt of JAK inhibitor, but pt has hx of DVT so not a candidate for JAK inhibitor  - Pictures obtained today for clinical monitoring   - Diagnosis, treatment options, prognosis, risk/ benefit, and side effects of treatment were discussed with the patient.      - Start Opzelura 1.5% cream twice daily to active areas on the weekdays.   - Continue clobetasol (TEMOVATE) 0.05 % ointment; Apply topically two (2) times a day. To active areas until smooth on the weekends. Previously provided cotton gloves to wear after applying clobetasol. Refilled today.   - Continue dupilumab 300 mg/2 mL PnIj; Inject 300 mg under the skin once a week. SER including increased risk of infection.   - Encouraged to apply Eucerin daily to moisturize    Allergic contact dermatitis  - Diagnosis, treatment options, prognosis, risk/ benefit, and side effects of treatment were discussed with the patient.    - Continue clobetasol (TEMOVATE) 0.05 % ointment; Apply topically two (2) times a day. To active areas until smooth.  - Advised patient to wear PPE at work to avoid triggers    The patient was advised to call for an appointment should any new, changing, or symptomatic lesions develop.     RTC: Return in about 1 year (around 11/02/2024) for follow up of eczema. or sooner as needed   _________________________________________________________________      Chief Complaint     Chief Complaint   Patient presents with    Follow-up     Pt reports improvement with medications but still flaring on arms, legs and hands       HPI     Timothy Tapia is a 58 y.o. male who presents as a returning patient (last seen by Lucrecia Sables, PA on 10/08/2022) to Dermatology for follow up of atopic-like dermatitis. At last visit, patient was to continue dupilumab 300 mg every week, hold betamethasone 0.05% ointment, and start clobetasol 0.05% ointment for his dermatitis.     Today, patient reports improvement of his eczema since last visit. Notes flares on his lower legs, feet, hands, Currently taking dupixent and applying clobetasol with some improvement. Denies history of parasitic infections, eye issues, cold sores.     He also reports a rash on his left forearm. Notes that it appeared after a work incident. Currently treating with neosporin with little improvement.     The patient denies any other new or changing lesions or areas of concern.     Pertinent Past Medical History     No history of skin cancer    Family History:   Negative for melanoma    Past Medical History, Family History, Social History, Medication List, Allergies, and Problem List were reviewed in the rooming section of Epic.     ROS: Other than symptoms mentioned in the HPI, no fevers, chills, or other skin complaints    Physical Examination     GENERAL: Well-appearing male in no acute distress, resting comfortably.  NEURO: Alert and oriented, answers questions appropriately  PSYCH: Normal mood and affect  SKIN: Examination of the bilateral upper extremities, bilateral lower extremities, hands, and feet was performed  - hyperpigmented papules on bilateral calves and ankles  - slightly scaly and atrophic skin on dorsal hands  - erythematous scaly plaques with minor fissuring on bilateral ventral forearms         All areas not commented on are within normal limits or unremarkable    Scribe's Attestation: Ladene Pickle. Hulda Mage, PA-C obtained and performed the history, physical exam and medical decision making elements that were entered into the chart. Signed by Audrey Leash, Scribe, on Nov 03, 2023 at 11:18 AM.    ???Nov 06, 2023 9:49 PM. Documentation assistance provided by the Scribe. I was present during the time the encounter was recorded. The information recorded by the Scribe was done at my direction and has been reviewed and validated by me.???         (Approved Template 03/13/2020)

## 2023-11-03 ENCOUNTER — Ambulatory Visit: Admit: 2023-11-03 | Discharge: 2023-11-03 | Payer: Medicare (Managed Care) | Attending: Medical | Primary: Medical

## 2023-11-03 ENCOUNTER — Ambulatory Visit
Admit: 2023-11-03 | Discharge: 2023-11-03 | Payer: Medicare (Managed Care) | Attending: Infectious Disease | Primary: Infectious Disease

## 2023-11-03 DIAGNOSIS — Z87438 Personal history of other diseases of male genital organs: Principal | ICD-10-CM

## 2023-11-03 DIAGNOSIS — D509 Iron deficiency anemia, unspecified: Principal | ICD-10-CM

## 2023-11-03 DIAGNOSIS — L239 Allergic contact dermatitis, unspecified cause: Principal | ICD-10-CM

## 2023-11-03 DIAGNOSIS — Z113 Encounter for screening for infections with a predominantly sexual mode of transmission: Principal | ICD-10-CM

## 2023-11-03 DIAGNOSIS — E1165 Type 2 diabetes mellitus with hyperglycemia: Principal | ICD-10-CM

## 2023-11-03 DIAGNOSIS — Z9189 Other specified personal risk factors, not elsewhere classified: Principal | ICD-10-CM

## 2023-11-03 DIAGNOSIS — L309 Dermatitis, unspecified: Principal | ICD-10-CM

## 2023-11-03 DIAGNOSIS — B20 Human immunodeficiency virus [HIV] disease: Principal | ICD-10-CM

## 2023-11-03 LAB — COMPREHENSIVE METABOLIC PANEL
ALBUMIN: 3.4 g/dL (ref 3.4–5.0)
ALKALINE PHOSPHATASE: 115 U/L (ref 46–116)
ALT (SGPT): 15 U/L (ref 10–49)
ANION GAP: 11 mmol/L (ref 5–14)
AST (SGOT): 14 U/L (ref <=34)
BILIRUBIN TOTAL: 0.2 mg/dL — ABNORMAL LOW (ref 0.3–1.2)
BLOOD UREA NITROGEN: 7 mg/dL — ABNORMAL LOW (ref 9–23)
BUN / CREAT RATIO: 10
CALCIUM: 9.3 mg/dL (ref 8.7–10.4)
CHLORIDE: 107 mmol/L (ref 98–107)
CO2: 23.3 mmol/L (ref 20.0–31.0)
CREATININE: 0.67 mg/dL — ABNORMAL LOW (ref 0.73–1.18)
EGFR CKD-EPI (2021) MALE: >90 mL/min/1.73m2 (ref >=60)
GLUCOSE RANDOM: 124 mg/dL (ref 70–179)
POTASSIUM: 4.1 mmol/L (ref 3.4–4.8)
PROTEIN TOTAL: 7 g/dL (ref 5.7–8.2)
SODIUM: 141 mmol/L (ref 135–145)

## 2023-11-03 LAB — LYMPH MARKER LIMITED,FLOW
ABSOLUTE CD3 CNT: 1120 {cells}/uL (ref 915–3400)
ABSOLUTE CD4 CNT: 736 {cells}/uL (ref 510–2320)
ABSOLUTE CD8 CNT: 368 {cells}/uL (ref 180–1520)
CD3% (T CELLS): 70 % (ref 61–86)
CD4% (T HELPER): 46 % (ref 34–58)
CD4:CD8 RATIO: 2 (ref 0.9–4.8)
CD8% T SUPPRESR: 23 % (ref 12–38)

## 2023-11-03 LAB — CBC W/ AUTO DIFF
BASOPHILS ABSOLUTE COUNT: 0.1 10*9/L (ref 0.0–0.1)
BASOPHILS RELATIVE PERCENT: 0.5 %
EOSINOPHILS ABSOLUTE COUNT: 0.2 10*9/L (ref 0.0–0.5)
EOSINOPHILS RELATIVE PERCENT: 1.4 %
HEMATOCRIT: 31 % — ABNORMAL LOW (ref 39.0–48.0)
HEMOGLOBIN: 10 g/dL — ABNORMAL LOW (ref 12.9–16.5)
LYMPHOCYTES ABSOLUTE COUNT: 1.6 10*9/L (ref 1.1–3.6)
LYMPHOCYTES RELATIVE PERCENT: 13.7 %
MEAN CORPUSCULAR HEMOGLOBIN CONC: 32.1 g/dL (ref 32.0–36.0)
MEAN CORPUSCULAR HEMOGLOBIN: 23.1 pg — ABNORMAL LOW (ref 25.9–32.4)
MEAN CORPUSCULAR VOLUME: 71.9 fL — ABNORMAL LOW (ref 77.6–95.7)
MEAN PLATELET VOLUME: 6.8 fL (ref 6.8–10.7)
MONOCYTES ABSOLUTE COUNT: 0.7 10*9/L (ref 0.3–0.8)
MONOCYTES RELATIVE PERCENT: 6 %
NEUTROPHILS ABSOLUTE COUNT: 9.2 10*9/L — ABNORMAL HIGH (ref 1.8–7.8)
NEUTROPHILS RELATIVE PERCENT: 78.4 %
PLATELET COUNT: 875 10*9/L — ABNORMAL HIGH (ref 150–450)
RED BLOOD CELL COUNT: 4.3 10*12/L (ref 4.26–5.60)
RED CELL DISTRIBUTION WIDTH: 20 % — ABNORMAL HIGH (ref 12.2–15.2)
WBC ADJUSTED: 11.7 10*9/L — ABNORMAL HIGH (ref 3.6–11.2)

## 2023-11-03 LAB — URINALYSIS WITH MICROSCOPY WITH CULTURE REFLEX PERFORMABLE
BACTERIA: NONE SEEN /HPF
BILIRUBIN UA: NEGATIVE
BLOOD UA: NEGATIVE
GLUCOSE UA: NEGATIVE
KETONES UA: NEGATIVE
LEUKOCYTE ESTERASE UA: NEGATIVE
NITRITE UA: NEGATIVE
PH UA: 6 (ref 5.0–9.0)
PROTEIN UA: NEGATIVE
RBC UA: <1 /HPF (ref <=3)
SPECIFIC GRAVITY UA: 1.013 (ref 1.003–1.030)
SQUAMOUS EPITHELIAL: <1 /HPF (ref 0–5)
UROBILINOGEN UA: <2
WBC UA: 1 /HPF (ref <=2)

## 2023-11-03 LAB — HEMOGLOBIN A1C
ESTIMATED AVERAGE GLUCOSE: 131 mg/dL
HEMOGLOBIN A1C: 6.2 % — ABNORMAL HIGH (ref 4.8–5.6)

## 2023-11-03 MED ORDER — LANCETS
0 refills | 0.00000 days | Status: CP
Start: 2023-11-03 — End: 2023-11-03

## 2023-11-03 MED ORDER — OPZELURA 1.5 % TOPICAL CREAM
TOPICAL | 2 refills | 0.00000 days | Status: CP
Start: 2023-11-03 — End: ?
  Filled 2023-11-05: qty 60, 30d supply, fill #0

## 2023-11-03 MED ORDER — BLOOD GLUCOSE TEST STRIPS
ORAL_STRIP | ORAL | 6 refills | 0.00000 days | Status: CP
Start: 2023-11-03 — End: 2023-11-03

## 2023-11-03 MED ORDER — EMPAGLIFLOZIN 10 MG TABLET
ORAL_TABLET | Freq: Every day | ORAL | 11 refills | 30.00000 days | Status: CP
Start: 2023-11-03 — End: 2024-11-02

## 2023-11-03 MED ORDER — CLOBETASOL 0.05 % TOPICAL OINTMENT
Freq: Two times a day (BID) | TOPICAL | 5 refills | 0.00000 days | Status: CP
Start: 2023-11-03 — End: 2024-11-02

## 2023-11-03 MED ORDER — DUPIXENT 300 MG/2 ML SUBCUTANEOUS PEN INJECTOR
SUBCUTANEOUS | 11 refills | 28.00000 days | Status: CP
Start: 2023-11-03 — End: ?
  Filled 2023-11-10: qty 8, 28d supply, fill #0

## 2023-11-03 NOTE — Unmapped (Signed)
 PCP:  Marie Shone, MD    11/03/2023    This is a RETURN visit for this 58 y.o. male with the following diagnoses:    Patient Active Problem List    Diagnosis Date Noted    Human immunodeficiency virus (HIV) disease 02/04/1995    Left hand pain 08/28/2023    Type 2 diabetes mellitus without complication, without long-term current use of insulin 08/06/2023    Mixed hyperlipidemia 08/06/2023    Essential (primary) hypertension 08/06/2023    Type 2 diabetes mellitus with hyperglycemia, without long-term current use of insulin 06/17/2023    Phimosis 05/30/2022    Uncircumcised male 05/30/2022    Chronic bilateral low back pain 11/09/2019    COPD (chronic obstructive pulmonary disease) 05/27/2017    Hypokalemia 05/27/2017    GERD (gastroesophageal reflux disease) 05/27/2017    Pancreatic pseudocyst 07/06/2015    GI bleeding 07/06/2015    ETOH abuse 07/06/2015    DVT (deep venous thrombosis), IMV thrombus 10/07/13 provoked per hematology (51mo therapy) 10/25/2013    Colitis 10/12/2013    Pancreatitis 02/15/2013    Tobacco use disorder 02/15/2013    HTN (hypertension) 02/15/2013    Barrett esophagus 02/15/2013    Asthma 02/15/2013    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  Swears he never misses a dose.   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  PLAN:  Repeat labs today  Continue current ART. No concerning interactions. Lipids ok.    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 Same for Actos  Saw DM clinic and started Jardiance. Recommedned he restart metformin 500 but he dose not want to.  PLAN:  Recheck A1C today  Reschedule missed DM clinic appt    Back and neck pain  DJD on MRI  Worsening  MRI Sept 20, 2022   IMPRESSION: Interval progression of degenerative changes, causing severe neural foraminal narrowing at L5-S1 on the right at L5-S1 and moderate neural foraminal narrowing at L5-S1 on the left and L4-L5 bilaterally.  Is seen Spine Clinic   Intraarticular corticosteroids had helped but no longer.  Now with neck and right shoulder pain.   Gabapentin 900 mg at bedtime is helping he says. Will try to escalate to 900 mg BID slowly. Watch for daytime somnolence.   Check MRI cervical spine  May need Neurosurgery consult  Spine Clinic involved    Hx Pancreatitis - Now quiescent   Old MRI shown that prior pancreatic pseudocyst resolved  On Creon     Barrett's Espohagitis  On PPI  Has some dysphagia  Will ask GI to see and consider repeat EGD given Barretts and symptoms     Smoking   Smoking still about 1/3 ppd  Says trying to stop but always says that     Mental Health  See above  On Remeron 30 mg at bedtime   Recently married to a woman and she  is supportive    Sexual Health  Monogamous with wife  Says was diagnosed with epididymitis last week. On levoflox  PLAN:  Check UA w reflex cx  Urine for GC and Ct     Health Maintenance   Immunizations:  HBV immune. HCV antibody positive but HCV viral load negative so not infected.   C19 fully vaccinated and boosted w bivalent.   Refuses flu shot.   Cancer Screening:   Colonoscopy in 2017. Multiple polyps. Repeat.   Barrett's so EGDs  PSA was low last visit  HTN: Controlled today. On ACEi plus Ca++ blocker.   Derm: Dermatology seeing for Atopic like dermatitis. On Dupixent.   Sinus congestion: Beconase nasal spray - less interaction with Pezcobix.     Will follow up in 12 months.       Chief Complaint: Routine HIV follow-up    HPI:  Timothy Tapia returns for a routine appointment. See detailed A/P.     HIV-wise: Mostly stable CD4 and HIV RNA. Reports good adherence to ART.      ROS:   No fever chills, sweats, nausea, vomiting, diarrhea, rash, headache, joint pain. Neg for epigastric pain. Pos nasal stuffiness    All other systems are negative.    ALLERGIES:  Sulfa (sulfonamide antibiotics) and Sulfur    MEDICATIONS:  Current Outpatient Medications   Medication Sig Dispense Refill    albuterol HFA 90 mcg/actuation inhaler Inhale 2 puffs by mouth prior to study CT scan 8.5 g 0    amlodipine (NORVASC) 5 MG tablet TAKE ONE TABLET BY MOUTH DAILY. 30 tablet 9    atorvastatin (LIPITOR) 20 MG tablet Take 1 tablet by mouth once daily 90 tablet 1    beclomethasone (BECONASE AQ) 42 mcg (0.042 %) nasal spray Place 2 sprays into each nostril two (2) times a day. 25 g 9    betamethasone, augmented, (DIPROLENE) 0.05 % ointment Apply twice daily to active areas until smooth. 100 g 9    blood sugar diagnostic (GLUCOSE BLOOD) Strp Check blood sugar as directed once a day and for symptoms of high or low blood sugar. 100 strip 6    blood-glucose meter kit Use as directed to test blood sugar.  Disp. blood glucose meter kit preferred by patient's insurance. Dx: Diabetes, E11.9 1 each 11    cetirizine (ZYRTEC) 10 MG tablet Take 1 tablet (10 mg total) by mouth daily. 30 tablet 9    clobetasol (TEMOVATE) 0.05 % ointment Apply topically two (2) times a day. To active areas until smooth 120 g 5    clotrimazole (MYCELEX) 10 mg troche Take 1 tablet (10 mg total) by mouth five (5) times a day. 70 tablet 3    CREON 24,000-76,000 -120,000 unit CpDR delayed release capsule TAKE TWO CAPSULES BY MOUTH THREE TIMES DAILY WITH MEALS AND ONE CAPSULE with snacks. 210 capsule 9    darunavir-cobicistat (PREZCOBIX) 800-150 mg-mg tablet Take 1 tablet by mouth daily. 30 tablet 9    dolutegravir (TIVICAY) 50 mg TABLET TAKE ONE TABLET BY MOUTH DAILY 30 tablet 11    dolutegravir (TIVICAY) 50 mg TABLET Take 1 tablet (50 mg total) by mouth daily. 30 tablet 9    dupilumab (DUPIXENT PEN) 300 mg/2 mL pen injector Inject the contents of 1 pen (300 mg total) under the skin every seven (7) days. 8 mL 2    empagliflozin (JARDIANCE) 10 mg tablet Take 1 tablet (10 mg total) by mouth daily. 30 tablet 3  empty container Misc Use as directed to dispose of Dupixent pens. 1 each 2    enalapril (VASOTEC) 20 MG tablet TAKE ONE TABLET BY MOUTH EVERY DAY 90 tablet 1    gabapentin (NEURONTIN) 300 MG capsule Take 3 tabs by mouth twice a day 270 capsule 9    lancets Misc Check blood sugar as directed once a day and for symptoms of high or low blood sugar. 100 each 6    meclizine (ANTIVERT) 25 mg tablet TAKE ONE TABLET BY MOUTH THREE TIMES DAILY AS NEEDED FOR dizziness. 30 tablet 5    mirtazapine (REMERON) 30 MG tablet TAKE ONE TABLET BY MOUTH DAILY. 30 tablet 9    pantoprazole (PROTONIX) 20 MG tablet TAKE ONE TABLET BY MOUTH EVERY DAY 90 tablet 1    sildenafil (VIAGRA) 100 MG tablet Take 1 tablet (100 mg total) by mouth once as needed for erectile dysfunction. Take one half to one whole tablet 30 minutes prior to sex. 10 tablet 5     Current Facility-Administered Medications   Medication Dose Route Frequency Provider Last Rate Last Admin    triamcinolone acetonide (KENALOG-40) injection 40 mg  40 mg Intra-articular Once [place], Courtney Jean, PA         PE:  A&A. NAD>  No thrush.       DATA:    Results for orders placed or performed in visit on 08/05/23   C-peptide   Result Value Ref Range    C-Peptide 1.62 0.48 - 5.05 ng/mL   Microalbumin / creatinine urine ratio   Result Value Ref Range    Creat U 21.1 Undefined mg/dL    Albumin Quantitative, Urine <0.3 Undefined mg/dL    Albumin/Creatinine Ratio     Glucose, Random   Result Value Ref Range    Glucose 127 70 - 179 mg/dL   POCT Glucose   Result Value Ref Range    Glucose, POC 121 70 - 179 mg/dL     *Note: Due to a large number of results and/or encounters for the requested time period, some results have not been displayed. A complete set of results can be found in Results Review.        Creatinine   Date Value Ref Range Status   10/16/2022 0.75 mg/dL Final   96/29/5284 1.32 mg/dL Final   44/07/270 5.36 0.60 - 1.10 mg/dL Final   64/40/3474 2.59 0.60 - 1.10 mg/dL Final   56/38/7564 3.32 0.76 - 1.27 mg/dL Final   95/18/8416 6.06 0.70 - 1.30 mg/dL Final   30/16/0109 3.23 0.70 - 1.30 mg/dL Final   55/73/2202 5.42 0.70 - 1.30 mg/dL Final       Triglycerides   Date Value Ref Range Status   09/09/2022 72 0 - 150 mg/dL Final   70/62/3762 831 1 - 149 mg/dL Final     HDL   Date Value Ref Range Status   02/15/2013 79 (H) 40 - 59 mg/dL Final     Cholesterol, HDL   Date Value Ref Range Status   09/09/2022 43 40 - 60 mg/dL Final     LDL Cholesterol, Calculated   Date Value Ref Range Status   02/15/2013 100 mg/dL Final     Comment:     :  ADULTS (20 years or older)  Optimal         <100  Near Optimal    100-129  Borderline High 130-159  High  160-189  Very High       >=190  CHILDREN (2-19 years)  Desirable       <110  Borderline High 110-129  High            >/= 130     Cholesterol, LDL, Calculated   Date Value Ref Range Status   09/09/2022 127 (H) 40 - 99 mg/dL Final     Comment:     NHLBI Recommended Ranges, LDL Cholesterol, for Adults (20+yrs) (ATPIII), mg/dL  Optimal              <347  Near Optimal        100-129  Borderline High     130-159  High                160-189  Very High            >=190  NHLBI Recommended Ranges, LDL Cholesterol, for Children (2-19 yrs), mg/dL  Desirable            <425  Borderline High     110-129  High                 >=130         IMAGING STUDIES:  None

## 2023-11-03 NOTE — Unmapped (Signed)
 Attempted to meet with patient during in clinic appointment but they had to go to another appointment.  Called patient and reminded them that they have an incomplete application that expires on 05/07. Patient is still missing proof of income (2 paystubs and ssi letter). Patient asked to be emailed benefits fax number.  Emailed patient.          Cynthea Drier,  Benefits & Eligibility Coordinator  Time of Intervention: 2 minutes

## 2023-11-04 LAB — HIV RNA, QUANTITATIVE, PCR
HIV RNA QNT RSLT: DETECTED — AB
HIV RNA: <20 {copies}/mL — ABNORMAL HIGH (ref <0)

## 2023-11-05 LAB — LIPASE: LIPASE: 32 U/L (ref 12–53)

## 2023-11-06 ENCOUNTER — Ambulatory Visit: Admit: 2023-11-06 | Discharge: 2023-11-07 | Payer: Medicare (Managed Care)

## 2023-11-06 DIAGNOSIS — D500 Iron deficiency anemia secondary to blood loss (chronic): Principal | ICD-10-CM

## 2023-11-06 LAB — HAPTOGLOBIN: HAPTOGLOBIN: 363 mg/dL — ABNORMAL HIGH (ref 30–200)

## 2023-11-06 LAB — IRON PANEL
IRON SATURATION: 3 % — ABNORMAL LOW (ref 20–55)
IRON: 13 ug/dL — ABNORMAL LOW (ref 65–175)
TOTAL IRON BINDING CAPACITY: 412 ug/dL (ref 250–425)

## 2023-11-06 LAB — CBC W/ AUTO DIFF
BASOPHILS ABSOLUTE COUNT: 0.1 10*9/L (ref 0.0–0.1)
BASOPHILS RELATIVE PERCENT: 1.2 %
EOSINOPHILS ABSOLUTE COUNT: 0.1 10*9/L (ref 0.0–0.5)
EOSINOPHILS RELATIVE PERCENT: 1.2 %
HEMATOCRIT: 30.1 % — ABNORMAL LOW (ref 39.0–48.0)
HEMOGLOBIN: 9.8 g/dL — ABNORMAL LOW (ref 12.9–16.5)
LYMPHOCYTES ABSOLUTE COUNT: 1.9 10*9/L (ref 1.1–3.6)
LYMPHOCYTES RELATIVE PERCENT: 19.5 %
MEAN CORPUSCULAR HEMOGLOBIN CONC: 32.7 g/dL (ref 32.0–36.0)
MEAN CORPUSCULAR HEMOGLOBIN: 23.4 pg — ABNORMAL LOW (ref 25.9–32.4)
MEAN CORPUSCULAR VOLUME: 71.4 fL — ABNORMAL LOW (ref 77.6–95.7)
MEAN PLATELET VOLUME: 6.9 fL (ref 6.8–10.7)
MONOCYTES ABSOLUTE COUNT: 0.4 10*9/L (ref 0.3–0.8)
MONOCYTES RELATIVE PERCENT: 4.2 %
NEUTROPHILS ABSOLUTE COUNT: 7.3 10*9/L (ref 1.8–7.8)
NEUTROPHILS RELATIVE PERCENT: 73.9 %
PLATELET COUNT: 823 10*9/L — ABNORMAL HIGH (ref 150–450)
RED BLOOD CELL COUNT: 4.21 10*12/L — ABNORMAL LOW (ref 4.26–5.60)
RED CELL DISTRIBUTION WIDTH: 20.1 % — ABNORMAL HIGH (ref 12.2–15.2)
WBC ADJUSTED: 9.9 10*9/L (ref 3.6–11.2)

## 2023-11-06 LAB — SLIDE REVIEW

## 2023-11-06 LAB — LACTATE DEHYDROGENASE: LACTATE DEHYDROGENASE: 180 U/L (ref 120–246)

## 2023-11-06 MED ORDER — CETIRIZINE 10 MG TABLET
ORAL_TABLET | Freq: Every day | ORAL | 9 refills | 0.00000 days
Start: 2023-11-06 — End: ?

## 2023-11-06 MED ORDER — PEG 3350-ELECTROLYTES 236 GRAM-22.74 GRAM-6.74 GRAM-5.86 GRAM SOLUTION
0 refills | 0.00000 days | Status: CP
Start: 2023-11-06 — End: ?

## 2023-11-06 NOTE — Unmapped (Signed)
 Medication Requested: zyrtec       Future Appointments   Date Time Provider Department Center   11/06/2023  3:00 PM [facility]   04/05/2024 10:15 AM Diedra Fowler, DO [facility]     Per Provider Note:   This medication is included in this patients clinical summary.    Standing order protocol requirements met?: No     Sent to: Provider for signing    Days Supply Given: TBD by provider   Number of Refills: TBD by provider

## 2023-11-06 NOTE — Unmapped (Signed)
 [facility] Specialty and Home Delivery Pharmacy Refill Coordination Note    Specialty Medication(s) to be Shipped:   Inflammatory Disorders: Dupixent    Other medication(s) to be shipped: No additional medications requested for fill at this time     Timothy Tapia, DOB: 11/22/1965  Phone: [phone] (home) [phone] (work)      All above HIPAA information was verified with patient.     Was a Nurse, learning disability used for this call? No    Completed refill call assessment today to schedule patient's medication shipment from the [facility] and [facility] Delivery Pharmacy  [phone]).  All relevant notes have been reviewed.     Specialty medication(s) and dose(s) confirmed: Regimen is correct and unchanged.   Changes to medications: Timothy Tapia reports no changes at this time.  Changes to insurance: No  New side effects reported not previously addressed with a pharmacist or physician: None reported  Questions for the pharmacist: No    Confirmed patient received a Conservation officer, historic buildings and a Surveyor, mining with first shipment. The patient will receive a drug information handout for each medication shipped and additional FDA Medication Guides as required.       DISEASE/MEDICATION-SPECIFIC INFORMATION        For patients on injectable medications: Patient currently has 1 doses left.  Next injection is scheduled for 5/8.    SPECIALTY MEDICATION ADHERENCE     Medication Adherence    Patient reported X missed doses in the last month: 0  Specialty Medication: dupilumab (DUPIXENT PEN) 300 mg/2 mL pen injector  Patient is on additional specialty medications: No  Informant: patient              Were doses missed due to medication being on hold? No    Dupixent 300/2 mg/ml: 1 doses of medicine on hand        REFERRAL TO PHARMACIST     Referral to the pharmacist: Not needed      [facility]     Shipping address confirmed in Epic.     Cost and Payment: Patient has a $0 copay, payment information is not required.    Delivery Scheduled: Yes, Expected medication delivery date: 11/10/23.     Medication will be delivered via Same Day Courier to the prescription address in Epic WAM.    Timothy Tapia Specialty and [facility]

## 2023-11-06 NOTE — Unmapped (Signed)
 INFECTIOUS DISEASES CLINIC DOCUMENTATION NOTE    Routine labs obtained at [facility] visit Nov 03 2023 revealed new microcytic anemia. Called Timothy Tapia and he states he had dark stools recently but has resolved. No symptoms of anemia.     He has a history of Barrett's Esophagus and had a traumatic EGD with esophageal perforation ~10 years ago.   No epigastric pain. He does state that liquids will occasionally get stuck when he drinks.     I have ordered repeat CBC/diff, iron panel and hemolysis labs and he will get these drawn tomorrow.   Order placed for colonoscopy and number given so he can call.   Based on this may refer to GI for consideration of EGD or imaging of upper GI tract.    Marie Shone, MD  Attending Physician, Division of Infectious Diseases

## 2023-11-06 NOTE — Unmapped (Signed)
 Colonoscopy  Procedure #1     Procedure #2   [id number]  MRN     Endoscopist     Is the patient's health insurance [employer], Aet[place], [place] [facility]), UHC Med [place], [employer], or Cigna?     Urgent procedure     Are you pregnant?     Are you in the process of scheduling or awaiting results of a heart ultrasound, stress test, or catheterization to evaluate new or worsening chest pain, dizziness, or shortness of breath?     Do you take: Plavix (clopidogrel), Coumadin (warfarin), Lovenox (enoxaparin), Pradaxa (dabigatran), Effient (prasugrel), Xarelto (rivaroxaban), Eliquis (apixaban), Pletal (cilostazol), or Brilinta (ticagrelor)?          Did ordering provider indicate how long to hold this medication in the order comments?          Which of the above medications are you taking?          What is the name of the medical practice that manages this medication?          What is the name of the medical provider who manages this medication?     Do you have hemophilia, von Willebrand disease, or low platelets?     Do you have a pacemaker or implanted cardiac defibrillator?     Has a [place] [facility] provider specified the location(s)?     Which location(s) did the [facility] provider specify?          [facility]          [place]          [facility]     Do you see a liver specialist for chronic liver disease?     Is the procedure indication for variceal screening?     Is procedure indication for variceal banding (this does NOT include variceal screening)?     Have you had a heart attack, stroke or heart stent placement within the past 6 months?     Month of event     Year of event (ONLY ENTER LAST 2 DIGITS)        5  Height (feet)   4  Height (inches)   144  Weight (pounds)   24.7  BMI          Did the ordering provider specify a bowel prep?          What bowel prep was specified?     Do you have an ostomy (bag on your stomach that collects your stool)?          Is it an ileostomy?          Is it a colostomy? Patient doesn't know.     Do you have chronic kidney disease?     Do you have chronic constipation or have you had poor quality bowel preps for past colonoscopies?     Do you have Crohn's disease or ulcerative colitis?     Have you had weight loss surgery?          When you walk around your house or grocery store, do you have to stop and rest due to shortness of breath, chest pain, or light-headedness?     Do you ever use supplemental oxygen?     Have you been hospitalized for cirrhosis of the liver or heart failure in the last 12 months?     Have you been treated for mouth or throat cancer with radiation or surgery?  Have you been told that it is difficult for doctors to insert a breathing tube in you during anesthesia?     Have you had a heart or lung transplant?          Are you on dialysis?     Do you have cirrhosis of the liver?     Do you have myasthenia gravis?     Is the patient a prisoner?   ################# ## ###################################################################################################################   MRN:  [id number]   Anticoag Review  No   Nurse Triage  No   GI clinic consult  No   Procedure(s):  Colonoscopy     0   Endoscopist:  0   Urgent:  No   Prep:  Nulytely Prep                  --------------------------- --- ----------------------------------------------------------------------------------------------------------------------------------------------------------------------------   [facility] Locations:  [facility]     [facility]     [place]        Requested Locations:              ################# ## ###################################################################################################################

## 2023-11-10 MED ORDER — CETIRIZINE 10 MG TABLET
ORAL_TABLET | Freq: Every day | ORAL | 9 refills | 30.00000 days | Status: CP
Start: 2023-11-10 — End: ?

## 2023-11-13 NOTE — Unmapped (Signed)
 Spoke with Timothy Tapia regarding upcoming GI procedure on 11/17/23 at [facility] location at 1500 with an arrival time of 1400.     Informed patient prep instructions were sent via MyChart on 11/06/23. Confirmed instructions were received, reviewed and understood. No questions at this time. Number provided for future questions if they arise.     Verified prep medications ordered/received.    Reviewed information regarding holding Enalapril and Jardiance the day of procedure (until after procedure). All other approved medications should be taken at least two hours prior to appointment time.      Reviewed low fiber diet should start three days prior to procedure. Reviewed liquid diet requirement the entire day prior to procedure and nothing at all 2 hours prior to procedure.    Reviewed driver requirement.    No further questions at this time.

## 2023-11-17 ENCOUNTER — Encounter
Admit: 2023-11-17 | Discharge: 2023-11-17 | Payer: Medicare (Managed Care) | Attending: Student in an Organized Health Care Education/Training Program | Primary: Student in an Organized Health Care Education/Training Program

## 2023-11-17 ENCOUNTER — Inpatient Hospital Stay: Admit: 2023-11-17 | Discharge: 2023-11-17 | Payer: Medicare (Managed Care)

## 2023-11-17 MED ADMIN — propofol (DIPRIVAN) infusion 10 mg/mL: INTRAVENOUS | @ 20:00:00 | Stop: 2023-11-17

## 2023-11-17 MED ADMIN — lidocaine (PF) (XYLOCAINE-MPF) 20 mg/mL (2 %) injection: INTRAVENOUS | @ 20:00:00 | Stop: 2023-11-17

## 2023-11-17 MED ADMIN — Propofol (DIPRIVAN) injection: INTRAVENOUS | @ 20:00:00 | Stop: 2023-11-17

## 2023-12-01 DIAGNOSIS — K219 Gastro-esophageal reflux disease without esophagitis: Principal | ICD-10-CM

## 2023-12-01 DIAGNOSIS — K2270 Barrett's esophagus without dysplasia: Principal | ICD-10-CM

## 2023-12-01 DIAGNOSIS — D500 Iron deficiency anemia secondary to blood loss (chronic): Principal | ICD-10-CM

## 2023-12-01 MED ORDER — FERROUS FUMARATE 325 MG (106 MG IRON) TABLET
ORAL_TABLET | Freq: Every day | ORAL | 3 refills | 0.00000 days | Status: CP
Start: 2023-12-01 — End: ?

## 2023-12-01 NOTE — Unmapped (Signed)
 [place] INFECTIOUS DISEASES DOCUMENTATION NOTE    Iron deficiency anemia confirmed. Colonoscopy negative for source. Will pursue EGD. Has history of perforated esophagus many years ago during EGD. History of Barrett's and GERD. On PPI.     Mr Timothy Tapia regarding plan.    DW

## 2023-12-01 NOTE — Unmapped (Signed)
 Timothy Tapia called wanting to know if he should cancel his dentist appt to have a tooth pulled since his BS this morning is 224 and his iron level is low.   He had not taken his blood sugar medicine yet this morning.   Informed him that he should go ahead and take his Jardiance. His iron level issue would be up to the dentist. Stated understanding.

## 2023-12-02 NOTE — Unmapped (Signed)
 [facility] Specialty and Home Delivery Pharmacy Refill Coordination Note    Specialty Medication(s) to be Shipped:   Inflammatory Disorders: Dupixent    Other medication(s) to be shipped: No additional medications requested for fill at this time     Timothy Tapia, DOB: 05/26/1966  Phone: [phone] (home) [phone] (work)      All above HIPAA information was verified with patient.     Was a Nurse, learning disability used for this call? No    Completed refill call assessment today to schedule patient's medication shipment from the [facility]  [phone]).  All relevant notes have been reviewed.     Specialty medication(s) and dose(s) confirmed: Regimen is correct and unchanged.   Changes to medications: [name] reports no changes at this time.  Changes to insurance: No  New side effects reported not previously addressed with a pharmacist or physician: None reported  Questions for the pharmacist: No    Confirmed patient received a Conservation officer, historic buildings and a Surveyor, mining with first shipment. The patient will receive a drug information handout for each medication shipped and additional FDA Medication Guides as required.       DISEASE/MEDICATION-SPECIFIC INFORMATION        For patients on injectable medications: Patient currently has 1 doses left.  Next injection is scheduled for 6/5.    SPECIALTY MEDICATION ADHERENCE     Medication Adherence    Patient reported X missed doses in the last month: 0  Specialty Medication: dupilumab (DUPIXENT PEN) 300 mg/2 mL pen injector  Patient is on additional specialty medications: No  Informant: patient              Were doses missed due to medication being on hold? No    Dupixent 300/2 mg/ml: 1 doses of medicine on hand        REFERRAL TO PHARMACIST     Referral to the pharmacist: Not needed      [facility]     Shipping address confirmed in Epic.     Cost and Payment: Patient has a $0 copay, payment information is not required.    Delivery Scheduled: Yes, Expected medication delivery date: 12/04/23.     Medication will be delivered via Same Day Courier to the prescription address in Epic WAM.    Timothy Tapia Specialty and [facility]

## 2023-12-02 NOTE — Unmapped (Addendum)
 RN Follow-Up:  Called patient. He was wondering where Dr.Wohl sent the FE medication to. I told him it was at [facility]. Pt verbalized understanding.  ----------------------------------------------------------------------------------------------    ----- Message from Peggy Bowens sent at 12/02/2023  2:16 PM EDT -----  Incoming Call  Caller: Theotis Flake  Best callback number: [phone]  Reason for call: Patient would like a call back to know where his prescription is        Thank you!

## 2023-12-03 NOTE — Unmapped (Addendum)
 RN Follow-Up:    Returned pharmacys call. They wanted to know is they could change ferrous fumarate to ferrous Sulfate. I told them I would speak to doctor Ole Berkeley to confirm it is ok.  -----------------------------------------------------------------------------------------          ----- Message from Peggy Bowens sent at 12/02/2023  4:12 PM EDT -----  The [facility] has received an incoming clinical call:    Caller name: [facility]   If not the patient, relationship to the patient.  Best callback number: [phone]  Describe the reason for the call:Needs a call back to know if they can change the pt medication  Was an appointment offered as a placeholder?      Thanks!

## 2023-12-04 DIAGNOSIS — B20 Human immunodeficiency virus [HIV] disease: Principal | ICD-10-CM

## 2023-12-04 DIAGNOSIS — L309 Dermatitis, unspecified: Principal | ICD-10-CM

## 2023-12-04 DIAGNOSIS — I10 Essential (primary) hypertension: Principal | ICD-10-CM

## 2023-12-04 DIAGNOSIS — K2270 Barrett's esophagus without dysplasia: Principal | ICD-10-CM

## 2023-12-04 MED ORDER — TIVICAY 50 MG TABLET
ORAL_TABLET | Freq: Every day | ORAL | 3 refills | 90.00000 days | Status: CP
Start: 2023-12-04 — End: ?

## 2023-12-04 MED ORDER — BETAMETHASONE, AUGMENTED 0.05 % TOPICAL OINTMENT
TOPICAL | 5 refills | 0.00000 days | Status: CP
Start: 2023-12-04 — End: ?

## 2023-12-04 MED ORDER — PREZCOBIX 800 MG-150 MG TABLET
ORAL_TABLET | Freq: Every day | ORAL | 3 refills | 90.00000 days | Status: CP
Start: 2023-12-04 — End: ?

## 2023-12-04 MED ORDER — ENALAPRIL MALEATE 20 MG TABLET
ORAL_TABLET | Freq: Every day | ORAL | 3 refills | 90.00000 days | Status: CP
Start: 2023-12-04 — End: ?

## 2023-12-04 MED ORDER — PANTOPRAZOLE 20 MG TABLET,DELAYED RELEASE
ORAL_TABLET | Freq: Every day | ORAL | 3 refills | 90.00000 days | Status: CP
Start: 2023-12-04 — End: ?

## 2023-12-04 MED FILL — DUPIXENT 300 MG/2 ML SUBCUTANEOUS PEN INJECTOR: SUBCUTANEOUS | 28 days supply | Qty: 8 | Fill #1

## 2023-12-04 NOTE — Unmapped (Signed)
 Medication Requested:   pantoprazole (PROTONIX) 20 MG tablet   betamethasone, augmented, (DIPROLENE) 0.05 % ointment   enalapril (VASOTEC) 20 MG tablet   dolutegravir (TIVICAY) 50 mg TABLET   darunavir-cobicistat (PREZCOBIX) 800-150 mg-mg tablet       Future Appointments   Date Time Provider Department Center   02/23/2024 10:15 AM Diedra Fowler, DO [facility]   04/05/2024 10:15 AM Diedra Fowler, DO [facility]   11/08/2024  9:00 AM Ole Berkeley Ellison Ha, MD [facility]     Per Provider Note: in med list    Standing order protocol requirements met?: Yes    Sent to: Pharmacy per protocol    Days Supply Given: 90 days  Number of Refills: 3

## 2023-12-04 NOTE — Unmapped (Signed)
 EGD  Procedure #1     Procedure #2   [id number]  MRN   Generic   Endoscopist     Is the patient's health insurance A[employer]-Reach, Aetna-[place], [facility]), UHC Med [place], [employer], or Cigna?     Urgent procedure     Are you pregnant?     Are you in the process of scheduling or awaiting results of a heart ultrasound, stress test, or catheterization to evaluate new or worsening chest pain, dizziness, or shortness of breath?     Do you take: Plavix (clopidogrel), Coumadin (warfarin), Lovenox (enoxaparin), Pradaxa (dabigatran), Effient (prasugrel), Xarelto (rivaroxaban), Eliquis (apixaban), Pletal (cilostazol), or Brilinta (ticagrelor)?          Did ordering provider indicate how long to hold this medication in the order comments?          Which of the above medications are you taking?          What is the name of the medical practice that manages this medication?          What is the name of the medical provider who manages this medication?     Do you have hemophilia, von Willebrand disease, or low platelets?     Do you have a pacemaker or implanted cardiac defibrillator?     Has a [place] [facility] provider specified the location(s)?     Which location(s) did the [facility] GI provider specify?          [facility]          [place]          [facility]     Do you see a liver specialist for chronic liver disease?     Is the procedure indication for variceal screening?     Is procedure indication for variceal banding (this does NOT include variceal screening)?     Have you had a heart attack, stroke or heart stent placement within the past 6 months?     Month of event     Year of event (ONLY ENTER LAST 2 DIGITS)        5  Height (feet)   4  Height (inches)   145  Weight (pounds)   24.9  BMI          Did the ordering provider specify a bowel prep?          What bowel prep was specified?     Do you have an ostomy (bag on your stomach that collects your stool)?          Is it an ileostomy?          Is it a colostomy? Patient doesn't know.     Do you have chronic kidney disease?     Do you have chronic constipation or have you had poor quality bowel preps for past colonoscopies?     Do you have Crohn's disease or ulcerative colitis?     Have you had weight loss surgery?          When you walk around your house or grocery store, do you have to stop and rest due to shortness of breath, chest pain, or light-headedness?     Do you ever use supplemental oxygen?     Have you been hospitalized for cirrhosis of the liver or heart failure in the last 12 months?     Have you been treated for mouth or throat cancer with radiation or surgery?  Have you been told that it is difficult for doctors to insert a breathing tube in you during anesthesia?     Have you had a heart or lung transplant?          Are you on dialysis?     Do you have cirrhosis of the liver?     Do you have myasthenia gravis?     Is the patient a prisoner?   ################# ## ###################################################################################################################   MRN:  [id number]   Anticoag Review  No   Nurse Triage  No   GI clinic consult  No   Procedure(s):  EGD     0   Endoscopist:  Generic    Urgent:  No   Prep:                    --------------------------- --- ----------------------------------------------------------------------------------------------------------------------------------------------------------------------------   [facility] Locations:  [facility]     [facility]     [place]        Requested Locations:              ################# ## ###################################################################################################################

## 2023-12-04 NOTE — Unmapped (Signed)
 Spoke with patient and confirmed that they would like to start using [facility] for refills.

## 2023-12-09 ENCOUNTER — Encounter
Admit: 2023-12-09 | Discharge: 2023-12-09 | Payer: Medicare (Managed Care) | Attending: Student in an Organized Health Care Education/Training Program | Primary: Student in an Organized Health Care Education/Training Program

## 2023-12-09 ENCOUNTER — Inpatient Hospital Stay: Admit: 2023-12-09 | Discharge: 2023-12-09 | Payer: Medicare (Managed Care)

## 2023-12-09 MED ADMIN — propofol (DIPRIVAN) infusion 10 mg/mL: INTRAVENOUS | @ 16:00:00 | Stop: 2023-12-09

## 2023-12-09 MED ADMIN — Propofol (DIPRIVAN) injection: INTRAVENOUS | @ 16:00:00 | Stop: 2023-12-09

## 2023-12-09 MED ADMIN — sodium chloride (NS) 0.9 % infusion: 10 mL/h | INTRAVENOUS | @ 15:00:00 | Stop: 2023-12-09

## 2023-12-09 MED ADMIN — lidocaine (PF) (XYLOCAINE-MPF) 20 mg/mL (2 %) injection: INTRAVENOUS | @ 16:00:00 | Stop: 2023-12-09

## 2023-12-26 NOTE — Unmapped (Signed)
 [facility] Specialty and Home Delivery Pharmacy Refill Coordination Note    Specialty Medication(s) to be Shipped:   Inflammatory Disorders: Dupixent     Other medication(s) to be shipped: No additional medications requested for fill at this time     Timothy Tapia, DOB: 30-Jan-1966  Phone: [phone] (home) [phone] (work)      All above HIPAA information was verified with patient.     Was a Nurse, learning disability used for this call? No    Completed refill call assessment today to schedule patient's medication shipment from the [facility] and Home Delivery Pharmacy  [phone]).  All relevant notes have been reviewed.     Specialty medication(s) and dose(s) confirmed: Regimen is correct and unchanged.   Changes to medications: Timothy Tapia reports no changes at this time.  Changes to insurance: No  New side effects reported not previously addressed with a pharmacist or physician: None reported  Questions for the pharmacist: No    Confirmed patient received a Conservation officer, historic buildings and a Surveyor, mining with first shipment. The patient will receive a drug information handout for each medication shipped and additional FDA Medication Guides as required.       DISEASE/MEDICATION-SPECIFIC INFORMATION        For patients on injectable medications: Patient currently has 0 doses left.  Next injection is scheduled for 7/3.    SPECIALTY MEDICATION ADHERENCE     Medication Adherence    Patient reported X missed doses in the last month: 0  Specialty Medication: dupilumab  (DUPIXENT  PEN) 300 mg/2 mL pen injector  Patient is on additional specialty medications: No  Informant: patient              Were doses missed due to medication being on hold? No    Dupixent  300/2 mg/ml: 0 doses of medicine on hand        REFERRAL TO PHARMACIST     Referral to the pharmacist: Not needed      [facility]     Shipping address confirmed in Epic.     Cost and Payment: Patient has a $0 copay, payment information is not required.    Delivery Scheduled: Yes, Expected medication delivery date: 12/31/23.     Medication will be delivered via Same Day Courier to the prescription address in Epic WAM.    Timothy Tapia [facility] and [facility]

## 2023-12-31 MED FILL — DUPIXENT 300 MG/2 ML SUBCUTANEOUS PEN INJECTOR: SUBCUTANEOUS | 28 days supply | Qty: 8 | Fill #2

## 2024-01-07 MED ORDER — ATORVASTATIN 20 MG TABLET
ORAL_TABLET | Freq: Every day | ORAL | 9 refills | 30.00000 days | Status: CP
Start: 2024-01-07 — End: ?

## 2024-01-07 MED ORDER — GABAPENTIN 300 MG CAPSULE
ORAL_CAPSULE | Freq: Two times a day (BID) | ORAL | 9 refills | 45.00000 days | Status: CP
Start: 2024-01-07 — End: ?

## 2024-01-07 NOTE — Unmapped (Signed)
 Medication Requested: Atorvastatin  and Gabapentin       Future Appointments   Date Time Provider Department Center   02/23/2024 10:15 AM Fayette Ozell Fallow, DO [facility]   11/08/2024  9:00 AM Jinny Alm Seta, MD [facility]     Per Provider Note: atorvastatin  (LIPITOR) 20 MG tablet Take 1 tablet (20 mg total) by mouth daily.  Per Provider Note: Gabapentin  900 mg at bedtime is helping he says. Will try to escalate to 900 mg BID slowly. Watch for daytime somnolence.  Dedra BROCKS, CMA, called patient to verify pharmacy, per patient [facility] is correct.     Standing order protocol requirements met?: Yes    Sent to: Pharmacy per protocol    Days Supply Given: 30 days  Number of Refills: 9

## 2024-01-20 MED ORDER — MECLIZINE 25 MG TABLET
ORAL_TABLET | Freq: Three times a day (TID) | ORAL | 5 refills | 10.00000 days | Status: CP | PRN
Start: 2024-01-20 — End: ?

## 2024-01-20 NOTE — Unmapped (Signed)
 Medication Requested: meclizine  (ANTIVERT ) 25 mg tablet       Future Appointments   Date Time Provider Department Center   02/23/2024 10:15 AM Fayette Ozell Fallow, DO [facility]   11/08/2024  9:00 AM Jinny Alm Seta, MD [facility]     Per Provider Note: in med list    Standing order protocol requirements met?: No    Sent to: Provider for signing    Days Supply Given: TBD  Number of Refills: TBD

## 2024-01-23 NOTE — Unmapped (Signed)
 [facility] Specialty and Home Delivery Pharmacy Refill Coordination Note    Specialty Medication(s) to be Shipped:   Inflammatory Disorders: Dupixent     Other medication(s) to be shipped: No additional medications requested for fill at this time     Timothy Tapia, DOB: Aug 01, 1965  Phone: [phone] (home) [phone] (work)      All above HIPAA information was verified with patient.     Was a Nurse, learning disability used for this call? No    Completed refill call assessment today to schedule patient's medication shipment from the [facility] and Home Delivery Pharmacy  [phone]).  All relevant notes have been reviewed.     Specialty medication(s) and dose(s) confirmed: Regimen is correct and unchanged.   Changes to medications: Timothy Tapia reports no changes at this time.  Changes to insurance: No  New side effects reported not previously addressed with a pharmacist or physician: None reported  Questions for the pharmacist: No    Confirmed patient received a Conservation officer, historic buildings and a Surveyor, mining with first shipment. The patient will receive a drug information handout for each medication shipped and additional FDA Medication Guides as required.       DISEASE/MEDICATION-SPECIFIC INFORMATION        For patients on injectable medications: Patient currently has 0 doses left.  Next injection is scheduled for 7/31.    SPECIALTY MEDICATION ADHERENCE     Medication Adherence    Patient reported X missed doses in the last month: 0  Specialty Medication: dupilumab  (DUPIXENT  PEN) 300 mg/2 mL pen injector  Patient is on additional specialty medications: No  Informant: patient              Were doses missed due to medication being on hold? No    Dupixent  300/2 mg/ml: 0 doses of medicine on hand        REFERRAL TO PHARMACIST     Referral to the pharmacist: Not needed      [facility]     Shipping address confirmed in Epic.     Cost and Payment: Patient has a $0 copay, payment information is not required.    Delivery Scheduled: Yes, Expected medication delivery date: 01/27/24.     Medication will be delivered via Same Day Courier to the prescription address in Epic WAM.    Timothy Tapia Specialty and [facility]

## 2024-01-27 MED FILL — DUPIXENT 300 MG/2 ML SUBCUTANEOUS PEN INJECTOR: SUBCUTANEOUS | 28 days supply | Qty: 8 | Fill #3

## 2024-01-28 NOTE — Unmapped (Addendum)
 RN Follow-Up 2:  I called patient back and gave him Dr. Geraldene message. He wants patient to stop taking jardiance  and see if dizziness resolves. He does have appt  with  diabetes clinic 8/25. Pt verbalized understanding.        RN Follow-Up Call:  Called Patient back. He is on Jardiance  and he is having dizzy spells.   When he lays down in bed at night he feels like the bed is spinning. The spells are happening several times a day. He says his blood sugars are 120-140. He saw a commercial that said Jardiance  can cause dizziness. He is  very concerned.  ----------------------------------------------------------------------------------------------      ----- Message from Rosina SQUIBB sent at 01/28/2024  9:48 AM EDT -----  Incoming Call  Caller: Timothy Tapia  Best callback number: [phone]  Reason for call: Patient needs a call back in regards to side effects due to medication        Thank you!

## 2024-01-29 NOTE — Unmapped (Addendum)
 RN Follow-Up:  Pt called back and said when he got home from work last night that he checked his blood sugar and it was 350. He said he has gotten it down now,but that was most likely why he was so dizzy. He wanted Dr. Jinny to be updated.  ----------------------------------------------------------------------------------------------      ----- Message from [facility] R sent at 01/29/2024  9:12 AM EDT -----  Incoming Call  Caller: Timothy Tapia  Best callback number: [phone]  Reason for call: patient wants to speak to [facility] again about being dizzy.      Thank you!

## 2024-01-30 NOTE — Unmapped (Addendum)
 RN Follow-UP  Called patient back. He thinks his accu-check machine is not working properly. His sugars have been high. One time today he checked it and it was 477. So he retook it and it was 268. It is now lowest at 202. He would like Dr.Wohl to put in an order or RX for another one. I told him if his blood sugar remained high, to go to ER. Pt voiced understanding.  ----------------------------------------------------------------------------------------------    ----- Message from Rosina SQUIBB sent at 01/30/2024  2:58 PM EDT -----  Incoming Call  Caller: [name]  Best callback number: [phone]  Reason for call: Patient needs a call back ASAP he said it was important        Thank you!

## 2024-02-02 DIAGNOSIS — E1165 Type 2 diabetes mellitus with hyperglycemia: Principal | ICD-10-CM

## 2024-02-02 MED ORDER — BLOOD-GLUCOSE METER KIT WRAPPER
ORAL | 11 refills | 0.00000 days | Status: CP
Start: 2024-02-02 — End: 2025-02-01

## 2024-02-02 MED ORDER — BLOOD GLUCOSE TEST STRIPS
ORAL_STRIP | ORAL | 6 refills | 0.00000 days | Status: CP
Start: 2024-02-02 — End: ?

## 2024-02-02 NOTE — Unmapped (Signed)
 Called patient. He had called PAC staff to speak with me. His blood sugars are still ranging from 200 to 300's. Then it will drop in the 100's. I told him

## 2024-02-03 DIAGNOSIS — E1165 Type 2 diabetes mellitus with hyperglycemia: Principal | ICD-10-CM

## 2024-02-03 MED ORDER — BLOOD GLUCOSE TEST STRIPS
ORAL_STRIP | ORAL | 6 refills | 0.00000 days | Status: CP
Start: 2024-02-03 — End: ?

## 2024-02-03 MED ORDER — LANCETS
3 refills | 0.00000 days | Status: CP
Start: 2024-02-03 — End: ?

## 2024-02-04 ENCOUNTER — Emergency Department
Admit: 2024-02-04 | Discharge: 2024-02-05 | Disposition: A | Discharge status: Home / Self Care | Payer: Medicare (Managed Care) | Attending: Student in an Organized Health Care Education/Training Program

## 2024-02-04 LAB — HIGH SENSITIVITY TROPONIN I - SINGLE: HIGH SENSITIVITY TROPONIN I: <3 ng/L (ref <=53)

## 2024-02-04 LAB — CBC W/ AUTO DIFF
BASOPHILS ABSOLUTE COUNT: 0.2 10*9/L — ABNORMAL HIGH (ref 0.0–0.1)
BASOPHILS RELATIVE PERCENT: 1.6 %
EOSINOPHILS ABSOLUTE COUNT: 0.1 10*9/L (ref 0.0–0.5)
EOSINOPHILS RELATIVE PERCENT: 1.2 %
HEMATOCRIT: 39.3 % (ref 39.0–48.0)
HEMOGLOBIN: 12.8 g/dL — ABNORMAL LOW (ref 12.9–16.5)
LYMPHOCYTES ABSOLUTE COUNT: 1.7 10*9/L (ref 1.1–3.6)
LYMPHOCYTES RELATIVE PERCENT: 16.3 %
MEAN CORPUSCULAR HEMOGLOBIN CONC: 32.5 g/dL (ref 32.0–36.0)
MEAN CORPUSCULAR HEMOGLOBIN: 25.8 pg — ABNORMAL LOW (ref 25.9–32.4)
MEAN CORPUSCULAR VOLUME: 79.5 fL (ref 77.6–95.7)
MEAN PLATELET VOLUME: 8.5 fL (ref 6.8–10.7)
MONOCYTES ABSOLUTE COUNT: 0.6 10*9/L (ref 0.3–0.8)
MONOCYTES RELATIVE PERCENT: 5.5 %
NEUTROPHILS ABSOLUTE COUNT: 7.9 10*9/L — ABNORMAL HIGH (ref 1.8–7.8)
NEUTROPHILS RELATIVE PERCENT: 75.4 %
PLATELET COUNT: 282 10*9/L (ref 150–450)
RED BLOOD CELL COUNT: 4.95 10*12/L (ref 4.26–5.60)
RED CELL DISTRIBUTION WIDTH: 23.1 % — ABNORMAL HIGH (ref 12.2–15.2)
WBC ADJUSTED: 10.5 10*9/L (ref 3.6–11.2)

## 2024-02-04 LAB — URINALYSIS WITH MICROSCOPY
BACTERIA: NONE SEEN /HPF
BILIRUBIN UA: NEGATIVE
BLOOD UA: NEGATIVE
GLUCOSE UA: >1000 — AB
KETONES UA: NEGATIVE
LEUKOCYTE ESTERASE UA: NEGATIVE
NITRITE UA: NEGATIVE
PH UA: 5.5 (ref 5.0–9.0)
PROTEIN UA: NEGATIVE
RBC UA: <1 /HPF (ref <=3)
SPECIFIC GRAVITY UA: 1.031 — ABNORMAL HIGH (ref 1.003–1.030)
SQUAMOUS EPITHELIAL: <1 /HPF (ref 0–5)
UROBILINOGEN UA: <2
WBC UA: <1 /HPF (ref <=2)

## 2024-02-04 LAB — BASIC METABOLIC PANEL
ANION GAP: 11 mmol/L (ref 5–14)
BLOOD UREA NITROGEN: 19 mg/dL (ref 9–23)
BUN / CREAT RATIO: 27
CALCIUM: 8.9 mg/dL (ref 8.7–10.4)
CHLORIDE: 104 mmol/L (ref 98–107)
CO2: 23 mmol/L (ref 20.0–31.0)
CREATININE: 0.7 mg/dL — ABNORMAL LOW (ref 0.73–1.18)
EGFR CKD-EPI (2021) MALE: >90 mL/min/1.73m2 (ref >=60)
GLUCOSE RANDOM: 355 mg/dL — ABNORMAL HIGH (ref 70–179)
POTASSIUM: 4.2 mmol/L (ref 3.4–4.8)
SODIUM: 138 mmol/L (ref 135–145)

## 2024-02-04 LAB — MAGNESIUM: MAGNESIUM: 1.9 mg/dL (ref 1.6–2.6)

## 2024-02-04 MED ORDER — METFORMIN 500 MG TABLET
ORAL_TABLET | Freq: Every day | ORAL | 0 refills | 90.00000 days | Status: CP
Start: 2024-02-04 — End: 2024-05-04

## 2024-02-04 NOTE — Unmapped (Signed)
 Here for hyperglycemia, unable to control w/ regular meds (Jardiance ). Endorsing only polyuria and some dizziness. BGL 149 in triage.

## 2024-02-04 NOTE — Unmapped (Signed)
 Pt presents from home for c/o elevated BS. Reports BS has been in 300s daily. On Jardiance .

## 2024-02-04 NOTE — Unmapped (Signed)
 [place]  Emergency Department Provider Note     ED Clinical Impression     Final diagnoses:   Benign paroxysmal positional vertigo, unspecified laterality (Primary)   Closed displaced fracture of distal phalanx of toe of right foot      HPI, Physical Exam     HPI:  February 04, 2024 8:56 PM   Timothy Tapia is a 58 y.o. male with past medical history of HIV (CD4 736 on 11/03/23; on Prezcobix  and Dolutegravir ), HTN, COPD, asthma, GERD with barrett's esophagus, and T2DM presenting with hyperglycemia. The patient reports hyperglycemia and fluctuating blood sugar levels since stopping his Jardiance  (7 days ago on 01/28/24 per chart review). He notes that he stopped the medication after experiencing dizziness upon standing up. He also endorses recent polyuria, increased hunger, and dry mouth. He states that he was on 10 mg QD and is not currently on Metformin . Denies headache, ear fullness, fevers, chills, rhinorrhea, congestion, sore throat, chest pain, shortness of breath, abdominal pain, emesis, dysuria, hematuria, malodorous urine, diarrhea, hematochezia, or constipation.     The patient also reports R sided second toe pain. Denies recent falls with head strike.     Per recent [facility] ID telephone encounter notes, the patient was instructed to stop his Jardiance  7 days ago (on 01/28/24) by his provider to see if his room-spinning dizziness resolves. Since then, he has had hyperglycemia ranging between 202-477, which he initially attributed to his accu-check machine malfunctioning. The patient has an appointment with the [facility] on 02/23/24.     Physical Exam:   Constitutional: Alert and oriented. No acute distress.  HEENT: Normocephalic and atraumatic. Conjunctivae injected bilaterally. No congestion. Moist mucous membranes.  Intact extraocular movements, no nystagmus.  No dizziness at the time of evaluation.  Dix-Hallpike maneuver positive on right side  CV: Rate as above, regular rhythm. Normal and symmetric distal pulses. Brisk capillary refill. Normal skin turgor.  Pulm: Normal respiratory effort. Breath sounds are normal. There are no wheezing or crackles heard.  GI: Soft, non-distended, non-tender.  MSK: Non-tender with normal range of motion in all extremities.  Tenderness to palpation of the distal right second toe without any associated traumatic findings.  Intact motor and sensory.  Neuro: Normal speech and language. Patient is moving all extremities equally, face is symmetric at rest and with speech.  Skin: Skin is warm, dry and intact. No rash noted.    Vitals:    02/04/24 2144 02/04/24 2147 02/04/24 2148 02/05/24 0039   BP: 126/65 134/74 129/77 126/82   Pulse: 71 73 82 66   Resp: 20 20 18 16    Temp:    37.2 ??C (99 ??F)   TempSrc:    Oral   SpO2: 97% 98% 96% 99%       Medical Decision Making, ED Course     BP 126/82  - Pulse 66  - Temp 37.2 ??C (99 ??F) (Oral)  - Resp 16  - SpO2 99%     Initial Clinical Impression/DDX/Medical Decision Making    On initial assessment, this is a 58 year old male with history of type 2 diabetes on Jardiance  and consistently presents to the emergency department with concerns of blood glucose fluctuations this week associated with polyuria and sensations of dizziness when going from sitting to standing lasting for short period before resolving.  No fever, chills, other sick symptoms.  No abdominal pain.  Objectively patient is not tachypneic, afebrile, with stable blood pressure and heart rate.  He  does not appear significantly dry.  Dix-Hallpike maneuvers are positive right side.  Palpation of the patient's right toe without significant traumatic findings although some tenderness to palpation of the distal aspect of the phalange E the second digit on his right foot.  Orthostatics positive with position change contributing to lightheadedness and dizziness.  Differential diagnosis includes dehydration, electrolyte derangement, BPPV, vestibular neuritis.  Considerations for urinary tract infection, fracture of the second toe on the right side.  Low concern for DKA or HHS based on exam.  Considerations for the patient's polyuria be related to empagliflozin  related diuresis.  I have low concern for posterior circulation stroke given the patient is not persistently vertiginous and that is associated predominantly with position changes.  There is also considerations for hypovolemia as a result of the polyuria to contribute to the patient's lightheadedness on standing, such as orthostatic hypotension.  He will be appropriate for us  to administer IV fluid bolus, check basic laboratory studies, check urinalysis to ensure there is no signs of urinary tract infection associate with diuresis and obtain x-ray imaging of the patient's right foot with emphasis on the toe the patient is reporting symptoms and    Workup and further updates/updates to plan as per ED Course below:    ED Course:  ED Course as of 02/05/24 0251   Wed Feb 04, 2024   2051 Glucose, POC: 149   2126 Glucose, POC(!): 339   2213 CO2: 23.0   2214 No electrolyte derangement, no elevation in anion gap, no AKI   2214 hsTroponin I: <3   2214 Magnesium : 1.9   2214 CBC without leukocytosis.  There is a microcytic anemia to 12.8 improved from x3 months ago (9.8)   2214 Platelet: 282   2215 Urinalysis with glycosuria but no ketonuria, evidence of infection or hematuria   2313 +BPPV with dix halpike.   Metformin  250mg  once daily   Thu Feb 05, 2024   0002 Glucose, POC(!): 225   0016 IMPRESSION:  1.  Acute fracture of the second distal phalanx through the shaft and of the medial extra-articular aspect of the proximal end segment, minimally displaced.  2.  Surrounding soft tissue swelling.  3.  No joint malalignment.     9752 We discussed the plan for discharge with buddy taping, hard soled boot placement, podiatry follow-up in the setting of the patient's right phalangeal fracture.  For the patient's information regarding Epley maneuver to be completed at home ENT referral for potential vestibular rehab..  Will also be providing the patient a once daily 250 mg metformin  prescription which he can begin taking before he is able to follow-up with his endocrinologist and primary care doctor.  We discussed return precautions in detail with the patient verbalizing his understanding and endorsed this plan.  Patient was subsequently discharged in stable condition       Discussion of Management with other Physicians, QHP or Appropriate Source: If applicable, as documented in ED course above  Independent Interpretation of Studies: If applicable, documented in ED course above.  I have reviewed recent and relevant previous record, including: If applicable, inpatient/outpatient notes and prior studies, documented in Impression/MDM    Social Determinants that significantly affected care: None      ____________________________________________    The case was discussed with the attending physician, who is in agreement with the above assessment and plan.     Additional History Elements     Chief Complaint  Chief Complaint   Patient presents with  Elevated Blood Glucose with No Symptoms     Additional Historian(s): the patient's spouse    Past Medical History[1]    Past Surgical History[2]    Allergies  Sulfa (sulfonamide antibiotics) and Sulfur    Family History  Family History[3]    Social History  Short Social History[4]     Radiology     XR Toes Right   Final Result   1.  Acute fracture of the second distal phalanx through the shaft and of the medial extra-articular aspect of the proximal end segment, minimally displaced.   2.  Surrounding soft tissue swelling.   3.  No joint malalignment.          Pertinent labs & imaging results that were available during my care of the patient were independently interpreted by me and considered in my medical decision making (see chart for details).    Portions of this record have been created using Scientist, clinical (histocompatibility and immunogenetics). Dictation errors have been sought, but may not have been identified and corrected.    Documentation assistance was provided by Burton Picking, Scribe, on February 04, 2024 at 8:56 PM for Norman Edman, MD.  A scribe was used when documenting this visit. I agree with the above documentation. Signed by  Norman Edman, MD on  February 05, 2024 at 2:50 AM           [1]   Past Medical History:  Diagnosis Date    Acute pancreatitis (HHS-HCC)     Barrett esophagus     Black stool 07/08/2017    Bronchitis     Colon polyp     Diabetes mellitus        Eczema     Gastroesophageal reflux disease     HIV disease        Hypertension     Lack of access to transportation     Pancreatic pseudocyst (HHS-HCC)     Thrombus     IMV thrombus r/t colitis & pancreatitis 10/07/13    Uncircumcised male 05/30/2022    Visual impairment    [2]   Past Surgical History:  Procedure Laterality Date    esophogus surgery      PR CIRCUMCISION,OTHR N/A 09/11/2022    Procedure: CIRCUMCISION, SURGICAL EXCISION OTHER THAN CLAMP, DEVICE OR DORSAL SLIT; OLDER THAN 60 DAYS OF AGE;  Surgeon: Sharl Oliva Rush, MD;  Location: MAIN OR [facility];  Service: Urology    PR COLONOSCOPY W/BIOPSY SINGLE/MULTIPLE  10/11/2013    Procedure: COLONOSCOPY, FLEXIBLE, PROXIMAL TO SPLENIC FLEXURE; WITH BIOPSY, SINGLE OR MULTIPLE;  Surgeon: Luke LITTIE Notch, MD;  [facility]: [facility]URES [facility];  Service: Gastroenterology    PR COLONOSCOPY W/BIOPSY SINGLE/MULTIPLE N/A 09/29/2014    Procedure: COLONOSCOPY, FLEXIBLE, PROXIMAL TO SPLENIC FLEXURE; WITH BIOPSY, SINGLE OR MULTIPLE;  Surgeon: Arlean Obadiah Spitz, MD;  [facility];  Service: Gastroenterology    PR COLONOSCOPY W/BIOPSY SINGLE/MULTIPLE N/A 09/19/2015    Procedure: COLONOSCOPY, FLEXIBLE, PROXIMAL TO SPLENIC FLEXURE; WITH BIOPSY, SINGLE OR MULTIPLE;  Surgeon: Chauncey Glendia Pfeiffer, MD;  Location: [facility]URES [facility];  Service: Gastroenterology    PR COLSC FLX W/RMVL OF TUMOR POLYP LESION SNARE TQ N/A 09/19/2015 Procedure: COLONOSCOPY FLEX; W/REMOV TUMOR/LES BY SNARE;  Surgeon: Chauncey Glendia Pfeiffer, MD;  Location: GI PROCEDURES [facility];  Service: Gastroenterology    PR COLSC FLX W/RMVL OF TUMOR POLYP LESION SNARE TQ N/A 11/17/2023    Procedure: COLONOSCOPY FLEX; W/REMOV TUMOR/LES BY SNARE;  Surgeon: Sheikh, Shehzad Zafar,  MD;  Location: [facility]EDURES [facility] [place];  Service: Gastroenterology    PR ENDOSCOPIC US  EXAM, ESOPH N/A 08/10/2015    Procedure: UGI ENDOSCOPY; WITH ENDOSCOPIC ULTRASOUND EXAMINATION LIMITED TO THE ESOPHAGUS;  Surgeon: Krystal Jona Matter, MD;  Location: [facility];  Service: Gastroenterology    PR UPGI ENDOSCOPY,FN NEEDLE BX,GUIDED N/A 09/22/2014    Procedure: UGI W/TRANSENDOSCOPIC US -GUIDE INTRA/TRANSMURAL NEEDLE ASP/BX (INCL EXAM ESOPHAGUS, STOMACH, DOUDENUM/JEJ);  Surgeon: Krystal VEAR Matter, MD;  Location: [facility];  Service: Gastroenterology    PR UPPER GI ENDOSCOPY,BIOPSY N/A 09/19/2015    Procedure: UGI ENDOSCOPY; WITH BIOPSY, SINGLE OR MULTIPLE;  Surgeon: Chauncey Glendia Pfeiffer, MD;  Location: [facility]URES [facility];  Service: Gastroenterology    PR UPPER GI ENDOSCOPY,BIOPSY N/A 07/14/2017    Procedure: UGI ENDOSCOPY; WITH BIOPSY, SINGLE OR MULTIPLE;  Surgeon: Eleanor Dewey Sorrel, MD;  Location: [facility] PROCEDURES [facility];  Service: Gastroenterology    PR UPPER GI ENDOSCOPY,BIOPSY N/A 12/09/2023    Procedure: UGI ENDOSCOPY; WITH BIOPSY, SINGLE OR MULTIPLE;  Surgeon: Lucian Mikle Pfeiffer, MD;  Location: [facility];  Service: Gastroenterology   [3]   Family History  Problem Relation Age of Onset    Hypertension Mother     Diabetes Mother     COPD Father     Pancreatic cancer Neg Hx     Pancreatitis Neg Hx     Melanoma Neg Hx     Basal cell carcinoma Neg Hx     Squamous cell carcinoma Neg Hx    [4]   Social History  Tobacco Use    Smoking status: Every Day     Current packs/day: 0.25     Average packs/day: 0.3 packs/day for 30.6 years (7.7 ttl pk-yrs) Types: Cigarettes     Start date: 06/28/1993     Passive exposure: Current    Smokeless tobacco: Never    Tobacco comments:     1 pack lasts 4 days; referred to [facility]; Taking nicorette gum   Vaping Use    Vaping status: Never Used   Substance Use Topics    Alcohol use: Not Currently    Drug use: Not Currently     Types: Marijuana     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

## 2024-02-05 LAB — SLIDE REVIEW

## 2024-02-05 MED ORDER — METFORMIN 500 MG TABLET
ORAL_TABLET | Freq: Every day | ORAL | 0 refills | 90.00000 days | Status: CP
Start: 2024-02-05 — End: 2024-05-05

## 2024-02-05 MED ADMIN — lactated ringers bolus 1,000 mL: 1000 mL | INTRAVENOUS | @ 02:00:00 | Stop: 2024-02-04

## 2024-02-05 NOTE — Unmapped (Addendum)
 RN Follow-Up Call:  I called patient back. He went to ER yesterday since sugar was up again and having vertigo. They put him back on a 1/2 pill of meformin.I advised him if it goes high again to go back to the ER.    ---------------------------------------------------------------------------------------------      ----- Message from [facility] R sent at 02/04/2024 10:50 AM EDT -----  Incoming Call  Caller: [name]  Best callback number: [phone]  Reason for call: patient I still saying that his blood sugars are high        Thank you!

## 2024-02-19 NOTE — Unmapped (Signed)
 Patient paged on call ID fellow. Endorsed mild cough and runny nose. Wife tested positive for COVID at the doctors office and he had taken an at home COVID test. He was not sure how to interpret, informed him that a blue and red line together generally indicate a positive test. He denied any SOB or inability to eat/drink. No hx of lung pathology. Recommended he take OTC cold relief medication, tylenol  as needed and drink plenty of fluids. If severe symptoms develop he will present to office or ED for further evaluation. Her verbalized understanding.      Juliene Alamin  MD/PhD  Infectious Disease Fellow

## 2024-02-19 NOTE — Unmapped (Signed)
 [facility] Refill Coordination Note    Specialty Medication(s) to be Shipped:   Inflammatory Disorders: Dupixent     Other medication(s) to be shipped: No additional medications requested for fill at this time     Timothy Tapia, DOB: 1965/09/03  Phone: [phone] (home) [phone] (work)      All above HIPAA information was verified with patient.     Was a Nurse, learning disability used for this call? No    Completed refill call assessment today to schedule patient's medication shipment from the [facility]  [phone]).  All relevant notes have been reviewed.     Specialty medication(s) and dose(s) confirmed: Regimen is correct and unchanged.   Changes to medications: Timothy Tapia reports no changes at this time.  Changes to insurance: No  New side effects reported not previously addressed with a pharmacist or physician: None reported  Questions for the pharmacist: No    Confirmed patient received a Conservation officer, historic buildings and a Surveyor, mining with first shipment. The patient will receive a drug information handout for each medication shipped and additional FDA Medication Guides as required.       DISEASE/MEDICATION-SPECIFIC INFORMATION        For patients on injectable medications: Patient currently has 1 doses left.  Next injection is scheduled for 8/21.    SPECIALTY MEDICATION ADHERENCE     Medication Adherence    Patient reported X missed doses in the last month: 0  Specialty Medication: dupilumab  (DUPIXENT  PEN) 300 mg/2 mL pen injector  Patient is on additional specialty medications: No  Informant: patient              Were doses missed due to medication being on hold? No    Dupixent  300/2 mg/ml: 1 doses of medicine on hand        REFERRAL TO PHARMACIST     Referral to the pharmacist: Not needed      [facility]     Shipping address confirmed in Epic.     Cost and Payment: Patient has a $0 copay, payment information is not required.    Delivery Scheduled: Yes, Expected medication delivery date: 02/20/24.     Medication will be delivered via Same Day Courier to the prescription address in Epic WAM.    Timothy Tapia [facility]

## 2024-02-20 MED FILL — DUPIXENT 300 MG/2 ML SUBCUTANEOUS PEN INJECTOR: SUBCUTANEOUS | 28 days supply | Qty: 8 | Fill #4

## 2024-02-23 ENCOUNTER — Ambulatory Visit
Admit: 2024-02-23 | Discharge: 2024-02-24 | Payer: Medicare (Managed Care) | Attending: Student in an Organized Health Care Education/Training Program | Primary: Student in an Organized Health Care Education/Training Program

## 2024-02-23 DIAGNOSIS — E119 Type 2 diabetes mellitus without complications: Principal | ICD-10-CM

## 2024-02-23 DIAGNOSIS — K860 Alcohol-induced chronic pancreatitis: Principal | ICD-10-CM

## 2024-02-23 DIAGNOSIS — I10 Essential (primary) hypertension: Principal | ICD-10-CM

## 2024-02-23 MED ORDER — METFORMIN ER 500 MG TABLET,EXTENDED RELEASE 24 HR
ORAL_TABLET | Freq: Every day | ORAL | 3 refills | 90.00000 days | Status: CP
Start: 2024-02-23 — End: 2025-02-17

## 2024-02-23 MED ORDER — EMPAGLIFLOZIN 10 MG TABLET
ORAL_TABLET | Freq: Every day | ORAL | 2 refills | 90.00000 days | Status: CP
Start: 2024-02-23 — End: 2024-11-19

## 2024-02-23 NOTE — Unmapped (Signed)
 Restart Jardiance  and Metformin  once daily.     If blood sugar remains elevated after 1-2 weeks of use, notify Dr Fayette for medication adjustment.

## 2024-02-23 NOTE — Unmapped (Signed)
 Acucheck meter uploaded into Careers information officer. POC glucose and A1C done today. PP 10pm . 233 mg/dL.

## 2024-02-23 NOTE — Unmapped (Signed)
 [facility] DIABETES [facility] ENDOCRINOLOGY [place]  [address]  [address]  [place] [address]  Phone: [phone]  Fax: [phone]    Assessment/Plan:     Problem List Items Addressed This Visit       Pancreatitis (HHS-HCC)    Type 2 diabetes mellitus without complication, without long-term current use of insulin     - Primary    Relevant Medications    metFORMIN  (GLUCOPHAGE -XR) 500 MG 24 hr tablet    empagliflozin  (JARDIANCE ) 10 mg tablet    Essential (primary) hypertension     Timothy Tapia is a 58 y.o. male who presents for f/u of Type 2 diabetes mellitus. With their age, complications and current regimen an appropriate A1c goal is <7.5%. They are currently exceeding this goal.     Improvement following initial visit on Jardiance , now with worsening hyperglycemia.  Will restart prior combination and monitor closely. Encouraged patient to contact provider if without improvement in 1-2 weeks.     -  Metformin  XR 500mg  daily.  - Jardiance  10mg  daily.      Discussed that if without improvement in glucose, may need to consider insulin use. Could also attempt SU if wishes to avoid injections at this time.   Discussed potential utility in CGM if glucose readings do not respond to above intervention.     Monitoring for diabetes complications:  Renal: None. Albuminuria screen Feb 2025 negative.   Retinal: None. Eye exam UTD Feb 2025   Neurological: Yes, attributed to chronic back pain, managed on Gabapentin . Repeat foot exam next visit.   CVD: None.               BP at goal on current regimen including ACEi              Lipids: Managed on Atorvastatin  20mg  daily. Monitor at next visit.   FIB-4 Calculation: 0.25 at 11/06/2023  2:52 PM  Calculated from:  SGOT/AST: 14 U/L at 11/03/2023  9:52 AM  SGPT/ALT: 15 U/L at 11/03/2023  9:52 AM  Platelets: 823 10*9/L at 11/06/2023  2:52 PM  Age: 58 years    Return in about 4 months (around 06/24/2024) for In-person.    PCP: Jinny Alm Seta, MD    CC: Follow up Type 2 Diabetes    Subjective:    Timothy Tapia is a 58 y.o. male with PMH HTN, HIV on ART, substance use disorder,pancreatitis who presents for f/u of Type 2 diabetes mellitus.  Last seen 6 months ago.    Diabetes History   Diagnosed:   - Patient unclear regarding initial diagnosis, but diagnosed at least 2021. Well controlled for several years.   - History notable for multiple episodes of acute pancreatitis related to alcohol use (7985,7982). Denies resulting diabetes but has required chronic creon  use.   - Abrupt worsening of hyperglycemia at the end of 2024 for unclear reasons. Denies substance use during this time. Challenges with regular soda intake but has stopped in recent months.   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 No known ulcers or foot wounds.   No known retinopathy or nephropathy.     Previous treatments:  - Actos   - Metformin  dose limited by GI side effect     Diet:   - Reports challenges with sweet cravings since being sober. High use of regular soda in 2024, has  switched to low or zero sugar alternatives in the new year.      Exercise   - Limited by chronic back pain.      Social History:   - Current smoker, 0.3 ppd.   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  - Since May has noted a gradual increase in glucose. Unclear etiology but had stopped both Metformin  and then Jardiance .  He most recently returned to taking Metformin  once per day only in recent weeks.   - Denies other changes in health. Denies significant diet changes, using diet sodas only.     Currently taking:   Metformin  500mg  once daily.     Glucose data downloaded and reviewed as displayed in media and below.   - POC glucose testing 1x per day demonstrates persistent global hyperglycemia. Lab Results   Component Value Date    A1C 6.2 (H) 11/03/2023    A1C >14.0 (H) 06/17/2023    A1C 8.0 (H) 01/06/2023    A1C 5.8 (H) 09/09/2022     Wt Readings from Last 3 Encounters:   02/23/24 64.4 kg (142 lb)   12/09/23 65.8 kg (145 lb)   11/17/23 67.1 kg (148 lb)       Past Medical History[1]     The patient's past medical history, medications, and drug allergies were reviewed and, as needed, updated as part of this visit.    Review of Systems  No chest pain, SOB, DOE, swelling, vision changes, or paresthesias. No feet concerns. No new skin problems.      Objective:        BP 121/77 (BP Site: L Arm, BP Position: Sitting)  - Pulse 93  - Ht 162.6 cm (5' 4.02)  - Wt 64.4 kg (142 lb)  - BMI 24.36 kg/m??     General appearance:  Alert, pleasant, in no distress    Eyes:  No stare or lid edema. Sclera anicteric.       Neck:  supple,  trachea midline, no cervical adenopathy   Lung: No increased work of breathing, clear to auscultation bilaterally   Heart:  regular rate and rhythm   Extremities: No LE edema   Skin: warm and dry, no rashes   Pulses: 2+ and symmetric.   Neuro: No focal deficits, no hand tremors, gait stable          Lab Review    Lab Results   Component Value Date    NA 138 02/04/2024    K 4.2 02/04/2024    CL 104 02/04/2024    CO2 23.0 02/04/2024    BUN 19 02/04/2024    CREATININE 0.70 (L) 02/04/2024    GFRNONAA 99 05/13/2019    GFRAA 115 05/13/2019    CALCIUM 8.9 02/04/2024    ALBUMIN 3.4 11/03/2023    PHOS 4.1 01/24/2014       Lab Results   Component Value Date    CHOL 184 09/09/2022     Lab Results   Component Value Date    LDL 127 (H) 09/09/2022     Lab Results   Component Value Date    HDL 43 09/09/2022     Lab Results   Component Value Date    TRIG 72 09/09/2022     Lab Results  Component Value Date    ALT 15 11/03/2023       Lab Results   Component Value Date    CREATUR 21.1 08/05/2023     Lab Results   Component Value Date    Albumin Quantitative, Urine <0.3 08/05/2023    Albumin/Creatinine Ratio  08/05/2023      Comment:      Unable to calculate.     No results found for: LABCREA, MICROALBQTUR, MSHCGMOM, MALBCRERAT  No results found for: RAFORD ACE, PCRATIOUR    Lab Results   Component Value Date    TSH 0.949 11/13/2021         Radiology Review      Other Medical Data         [1]   Past Medical History:  Diagnosis Date    Acute pancreatitis (HHS-HCC)     Barrett esophagus     Black stool 07/08/2017    Bronchitis     Colon polyp     Diabetes mellitus         Eczema     Gastroesophageal reflux disease     HIV disease         Hypertension     Lack of access to transportation     Pancreatic pseudocyst (HHS-HCC)     Thrombus     IMV thrombus r/t colitis & pancreatitis 10/07/13    Uncircumcised male 05/30/2022    Visual impairment

## 2024-03-10 NOTE — Unmapped (Signed)
 [facility] Delivery [facility] Refill Coordination Note    Specialty Medication(s) to be Shipped:   Inflammatory Disorders: Dupixent     Other medication(s) to be shipped: No additional medications requested for fill at this time    Specialty Medications not needed at this time: N/A     Timothy Tapia, DOB: [birth year]  Phone: [phone] (home) [phone] (work)      All above HIPAA information was verified with patient.     Was a Nurse, learning disability used for this call? No    Completed refill call assessment today to schedule patient's medication shipment from the [facility] Delivery [facility]  [phone]).  All relevant notes have been reviewed.     Specialty medication(s) and dose(s) confirmed: Regimen is correct and unchanged.   Changes to medications: Timothy Tapia reports no changes at this time.  Changes to insurance: No  New side effects reported not previously addressed with a pharmacist or physician: None reported  Questions for the pharmacist: No    Confirmed patient received a Conservation officer, historic buildings and a Surveyor, mining with first shipment. The patient will receive a drug information handout for each medication shipped and additional FDA Medication Guides as required.       DISEASE/MEDICATION-SPECIFIC INFORMATION        For patients on injectable medications: Next injection is scheduled for 03/11/2024.    SPECIALTY MEDICATION ADHERENCE     Medication Adherence    Patient reported X missed doses in the last month: 0  Specialty Medication: DUPIXENT  PEN 300 mg/2 mL pen injector (dupilumab )  Patient is on additional specialty medications: No              Were doses missed due to medication being on hold? No    DUPIXENT  PEN 300 mg/2 mL pen injector (dupilumab )  1 days of medicine on hand       REFERRAL TO PHARMACIST     Referral to the pharmacist: Not needed      [facility]     Shipping address confirmed in Epic.     Cost and Payment: Patient has a $0 copay, payment information is not required.    Delivery Scheduled: Yes, Expected medication delivery date: 03/12/2024.     Medication will be delivered via Same Day Courier to the prescription address in Epic WAM.    [facility]  Specialty Technician

## 2024-03-12 MED FILL — DUPIXENT 300 MG/2 ML SUBCUTANEOUS PEN INJECTOR: SUBCUTANEOUS | 28 days supply | Qty: 8 | Fill #5

## 2024-03-25 DIAGNOSIS — E1165 Type 2 diabetes mellitus with hyperglycemia: Principal | ICD-10-CM

## 2024-03-25 MED ORDER — LANCETS
0 refills | 0.00000 days | Status: CP
Start: 2024-03-25 — End: ?

## 2024-03-25 MED ORDER — BLOOD GLUCOSE TEST STRIPS
ORAL_STRIP | ORAL | 6 refills | 0.00000 days | Status: CP
Start: 2024-03-25 — End: ?

## 2024-03-25 NOTE — Unmapped (Signed)
 Incoming Call  Caller: Timothy Tapia  Best callback number: [phone]  Reason for call: Needs a call back in regards to his diabetes. The diabetes clinic won't return his call        Thank you!

## 2024-03-25 NOTE — Unmapped (Signed)
 Spoke to patient. He said that the diabetes clinic did send refill in.

## 2024-03-30 DIAGNOSIS — E1165 Type 2 diabetes mellitus with hyperglycemia: Principal | ICD-10-CM

## 2024-03-30 MED ORDER — LANCETS
3 refills | 0.00000 days | Status: CP
Start: 2024-03-30 — End: ?

## 2024-03-30 NOTE — Unmapped (Signed)
 Refill request for Lancets sent per protocol. LV: 02/23/24

## 2024-04-01 NOTE — Unmapped (Signed)
 Timothy Tapia reports his Dupixent  continues to be somewhat helpful but he's having more itch and rash on his upper back. He reports clobetasol  is somewhat helpful but he didn't find the Opzelura  helpful. We discussed he could schedule a follow up visit - will send message.     [facility] Home Delivery Pharm[facility] Clinical Assessment & Refill Coordination Note    Timothy Tapia, DOB: 1965-07-23  Phone: [phone] (home) [phone] (work)    All above HIPAA information was verified with patient.     Was a Nurse, learning disability used for this call? No    Specialty Medication(s):   Inflammatory Disorders: Dupixent      Current Medications[1]     Changes to medications: Timothy Tapia reports no changes at this time.    Medication list has been reviewed and updated in Epic: Yes    Allergies[2]    Changes to allergies: No    Allergies have been reviewed and updated in Epic: Yes    SPECIALTY MEDICATION ADHERENCE     Dupixent  300 mg: 1 doses of medicine on hand      Medication Adherence    Patient reported X missed doses in the last month: 0  Specialty Medication: Dupient          Specialty medication(s) dose(s) confirmed: Regimen is correct and unchanged.     Are there any concerns with adherence? No    Adherence counseling provided? Not needed    CLINICAL MANAGEMENT AND INTERVENTION      Clinical Benefit Assessment:    Do you feel the medicine is effective or helping your condition? Yes, somewhat    Clinical Benefit counseling provided? Not needed    Adverse Effects Assessment:    Are you experiencing any side effects? No    Are you experiencing difficulty administering your medicine? No    Quality of Life Assessment:    Quality of Life    Rheumatology  Oncology  Dermatology  1. What impact has your specialty medication had on the symptoms of your skin condition (i.e. itchiness, soreness, stinging)?: Some  2. What impact has your specialty medication had on your comfort level with your skin?: Some  Cystic Fibrosis          How many days over the past month did your AD  keep you from your normal activities? For example, brushing your teeth or getting up in the morning. Patient declined to answer    Have you discussed this with your provider? Not needed    Acute Infection Status:    Acute infections noted within Epic:  No active infections    Patient reported infection: None    Therapy Appropriateness:    Is the medication and dose appropriate considering the patient???s diagnosis, treatment, and disease journey, comorbidities, medical history, current medications, allergies, therapeutic goals, self-administration ability, and access barriers? Yes, therapy is appropriate and should be continued     Clinical Intervention:    Was an intervention completed as part of this clinical assessment? No    DISEASE/MEDICATION-SPECIFIC INFORMATION      For patients on injectable medications: Next injection is scheduled for 10/9.    Chronic Inflammatory Diseases: Have you experienced any flares in the last month? Unclear - patient reports more itching and rash on back - unclear if this is eczema or other  Has this been reported to your provider? Pharmacist to report to provider    PATIENT SPECIFIC NEEDS     Does the patient have any physical, cognitive, or  cultural barriers? No    Is the patient high risk? No    Does the patient require physician intervention or other additional services (i.e., nutrition, smoking cessation, social work)? No    Does the patient have an additional or emergency contact listed in their chart? Yes    SOCIAL DETERMINANTS OF HEALTH     At the [facility], we have learned that life circumstances - like trouble affording food, housing, utilities, or transportation can affect the health of many of our patients.   That is why we wanted to ask: are you currently experiencing any life circumstances that are negatively impacting your health and/or quality of life? Patient declined to answer    Social Drivers of Health     Food Insecurity: Food Insecurity Present (08/23/2021)    Hunger Vital Sign     Worried About Running Out of Food in the Last Year: Sometimes true     Ran Out of Food in the Last Year: Sometimes true   Tobacco Use: High Risk (01/27/2024)    Received from [facility]    Patient History     Smoking Tobacco Use: Every Day     Smokeless Tobacco Use: Never     Passive Exposure: Not on file   Transportation Needs: No Transportation Needs (08/23/2021)    PRAPARE - Transportation     Lack of Transportation (Medical): No     Lack of Transportation (Non-Medical): No   Alcohol Use: Not At Risk (08/23/2021)    Alcohol Use     How often do you have a drink containing alcohol?: Never     How many drinks containing alcohol do you have on a typical day when you are drinking?: 1 - 2     How often do you have 5 or more drinks on one occasion?: Never   Housing: Not on file   Physical Activity: Sufficiently Active (08/23/2021)    Exercise Vital Sign     Days of Exercise per Week: 5 days     Minutes of Exercise per Session: 30 min   Utilities: Not on file   Stress: No Stress Concern Present (08/23/2021)    [employer] of Occupational Health - Occupational Stress Questionnaire     Feeling of Stress : Not at all   Interpersonal Safety: Not on file   Substance Use: Not on file (05/08/2023)   Intimate Partner Violence: Not At Risk (11/17/2023)    Humiliation, Afraid, Rape, and Kick questionnaire     Fear of Current or Ex-Partner: No     Emotionally Abused: No     Physically Abused: No     Sexually Abused: No   Social Connections: Moderately Isolated (08/23/2021)    Social Connection and Isolation Panel     Frequency of Communication with Friends and Family: More than three times a week     Frequency of Social Gatherings with Friends and Family: More than three times a week     Attends Religious Services: More than 4 times per year     Active Member of [employer] or Organizations: No     Attends Banker Meetings: Never     Marital Status: Separated   Physicist, medical Strain: Low Risk  (08/23/2021)    Overall Financial Resource Strain (CARDIA)     Difficulty of Paying Living Expenses: Not hard at all   Health Literacy: Low Risk  (08/23/2021)    Health Literacy     : Never  Internet Connectivity: Not on file       Would you be willing to receive help with any of the needs that you have identified today? Not applicable       SHIPPING     Specialty Medication(s) to be Shipped:   Inflammatory Disorders: Dupixent     Other medication(s) to be shipped: No additional medications requested for fill at this time    Specialty Medications not needed at this time: N/A     Changes to insurance: No    Cost and Payment: Patient has a $0 copay, payment information is not required.    Delivery Scheduled: Yes, Expected medication delivery date: 10/3.     Medication will be delivered via Same Day Courier to the confirmed prescription address in [facility].    The patient will receive a drug information handout for each medication shipped and additional FDA Medication Guides as required.  Verified that patient has previously received a Conservation officer, historic buildings and a Surveyor, mining.    The patient or caregiver noted above participated in the development of this care plan and knows that they can request review of or adjustments to the care plan at any time.      All of the patient's questions and concerns have been addressed.    [facility] Specialty Pharmacist         [1]   Current Outpatient Medications   Medication Sig Dispense Refill    albuterol  HFA 90 mcg/actuation inhaler Inhale 2 puffs by mouth prior to study CT scan 8.5 g 0    amlodipine  (NORVASC ) 5 MG tablet TAKE ONE TABLET BY MOUTH DAILY. 30 tablet 9    atorvastatin  (LIPITOR) 20 MG tablet TAKE ONE TABLET BY MOUTH DAILY. 30 tablet 9    beclomethasone (BECONASE  AQ) 42 mcg (0.042 %) nasal spray Place 2 sprays into each nostril two (2) times a day. (Patient not taking: Reported on 12/09/2023) 25 g 9    betamethasone , augmented, (DIPROLENE ) 0.05 % ointment APPLY TWICE DAILY TO ACTIVE AREAS UNTIL SMOOTH 100 g 5    blood sugar diagnostic (GLUCOSE BLOOD) Strp Check blood sugar as directed once a day and for symptoms of high or low blood sugar. Contour Next. 100 strip 6    blood-glucose meter kit Use as directed to test blood sugar.  Disp. blood glucose meter kit preferred by patient's insurance. Dx: Diabetes, E11.9. Does not want accu-chek guide 1 each 11    cephalexin  (KEFLEX ) 500 MG capsule  (Patient not taking: Reported on 12/09/2023)      cetirizine  (ZYRTEC ) 10 MG tablet TAKE ONE TABLET BY MOUTH DAILY. 30 tablet 9    clobetasol  (TEMOVATE ) 0.05 % ointment Apply topically two (2) times a day. To active areas until smooth. Not for long term continuous use. Not for face or skin folds 120 g 5    clotrimazole  (MYCELEX ) 10 mg troche Take 1 tablet (10 mg total) by mouth five (5) times a day. (Patient not taking: Reported on 12/09/2023) 70 tablet 3    CREON  24,000-76,000 -120,000 unit CpDR delayed release capsule TAKE TWO CAPSULES BY MOUTH THREE TIMES DAILY WITH MEALS AND ONE CAPSULE with snacks. 210 capsule 9    darunavir -cobicistat  (PREZCOBIX ) 800-150 mg-mg tablet TAKE ONE TABLET BY MOUTH DAILY 90 tablet 3    dolutegravir  (TIVICAY ) 50 mg TABLET Take 1 tablet (50 mg total) by mouth daily. 30 tablet 9    dolutegravir  (TIVICAY ) 50  mg TABLET TAKE ONE TABLET BY MOUTH DAILY 90 tablet 3    dupilumab  (DUPIXENT  PEN) 300 mg/2 mL pen injector Inject the contents of 1 pen (300 mg total) under the skin every seven (7) days. 8 mL 11    empagliflozin  (JARDIANCE ) 10 mg tablet Take 1 tablet (10 mg total) by mouth daily. 90 tablet 2    empty container Misc Use as directed to dispose of Dupixent  pens. 1 each 2    enalapril  (VASOTEC ) 20 MG tablet TAKE ONE TABLET BY MOUTH EVERY DAY 90 tablet 3    ferrous fumarate  325 mg (106 mg iron ) Tab Take 1 tablet by mouth in the morning. 30 tablet 3    gabapentin  (NEURONTIN ) 300 MG capsule TAKE THREE CAPSULES BY MOUTH TWICE DAILY. 270 capsule 9    lancets (ACCU-CHEK SOFTCLIX LANCETS) Misc USE TO CHECK BLOOD SUGAR ONCE DAILY FOR  SYMPTOMS  OF  HIGH  OR  LOW  BLOOD  SUGAR  Please dispense lancets for Contour meter. 100 each 3    lancets Misc USE TO CHECK BLOOD SUGAR ONCE DAILY FOR SYMPTOMS OF HIGH OR LOW BLOOD SUGAR 100 each 3    levoFLOXacin (LEVAQUIN) 500 MG tablet Take 1 tablet (500 mg total) by mouth daily. (Patient not taking: Reported on 12/09/2023)      meclizine  (ANTIVERT ) 25 mg tablet TAKE ONE TABLET BY MOUTH THREE TIMES DAILY AS NEEDED FOR dizziness. 30 tablet 5    metFORMIN  (GLUCOPHAGE -XR) 500 MG 24 hr tablet Take 1 tablet (500 mg total) by mouth daily before breakfast. 90 tablet 3    mirtazapine  (REMERON ) 30 MG tablet TAKE ONE TABLET BY MOUTH DAILY. 30 tablet 9    pantoprazole  (PROTONIX ) 20 MG tablet TAKE ONE TABLET BY MOUTH EVERY DAY 90 tablet 3    ruxolitinib (OPZELURA ) 1.5 % Crea Apply topically to areas of rash twice daily on weekdays. 60 g 2    sildenafil  (VIAGRA ) 100 MG tablet Take 1 tablet (100 mg total) by mouth once as needed for erectile dysfunction. Take one half to one whole tablet 30 minutes prior to sex. 10 tablet 5     Current Facility-Administered Medications   Medication Dose Route Frequency Provider Last Rate Last Admin    triamcinolone  acetonide (KENALOG -40) injection 40 mg  40 mg Intra-articular Once [place], Courtney Jean, PA       [2]   Allergies  Allergen Reactions    Sulfa (Sulfonamide Antibiotics) Hives    Sulfur Hives

## 2024-04-02 MED FILL — DUPIXENT 300 MG/2 ML SUBCUTANEOUS PEN INJECTOR: SUBCUTANEOUS | 28 days supply | Qty: 8 | Fill #6

## 2024-04-04 MED ORDER — CLOTRIMAZOLE 10 MG TROCHE
ORAL_TABLET | ORAL | 0 refills | 7.00000 days | Status: CP
Start: 2024-04-04 — End: 2024-04-11

## 2024-04-04 NOTE — Unmapped (Signed)
 Received page from operator requesting call back. Called back Timothy Tapia who reports last fall he was treated for thrush and he feels that he has had a recurrence of symptoms. He reports white plaque on tongue, denies any oropharyngeal pain, odynophagia. Would like a new prescription for mycelex  troche. Advised pt he should be seen by provider to confirm diagnosis and exclude alternative possibilities (sp. Leukoplakia). Sent a few days of mycelex  troche supply to [facility] per pt request (renewed prior script). Pt reports he will try to schedule same day or urgent care appointment in the coming days to confirm.     Timothy ALONSO Skene, MD  Fellow, [facility] Division of Infectious Diseases

## 2024-04-12 DIAGNOSIS — R42 Dizziness and giddiness: Principal | ICD-10-CM

## 2024-04-13 DIAGNOSIS — L239 Allergic contact dermatitis, unspecified cause: Principal | ICD-10-CM

## 2024-04-13 DIAGNOSIS — L309 Dermatitis, unspecified: Principal | ICD-10-CM

## 2024-04-13 MED ORDER — CLOBETASOL 0.05 % TOPICAL OINTMENT
Freq: Two times a day (BID) | TOPICAL | 5 refills | 0.00000 days | Status: CP
Start: 2024-04-13 — End: 2025-04-13

## 2024-04-13 NOTE — Unmapped (Signed)
 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 Biopsy 03/2020 with Epidermal acanthosis with focal spongiosis and prominent papillary dermal fibrosis. Previously discussed opt of JAK inhibitor, but pt has hx of DVT so not a candidate for JAK inhibitor  - Diagnosis, treatment options, prognosis, risk/ benefit, and side effects of treatment were discussed with the patient.      - Hold Opzelura  1.5% cream twice daily to active areas on the weekdays   - Continue clobetasol  (TEMOVATE ) 0.05 % ointment; Apply topically two (2) times a day. To active areas until smooth on the weekends. Previously provided cotton gloves to wear after applying clobetasol .  - Continue dupilumab  300 mg/2 mL PnIj; Inject 300 mg under the skin once a week. SER including increased risk of infection.   - Encouraged to apply Eucerin daily to moisturize    The patient was advised to call for an appointment should any new, changing, or symptomatic lesions develop.     RTC: Return in about 6 months (around 10/12/2024). or sooner as needed   _________________________________________________________________      Chief Complaint     Chief Complaint   Patient presents with    Dermatitis     Taking dupixent - still with some intermittent flares though improved with dupixent  - back, hands,  arms flared; thinks may  have fish allergy; opzelura  makes him itch worse       HPI     Timothy Tapia is a 58 y.o. male who presents as a returning patient (last seen by Camie Bihari, PA on 11/03/2023) to Dermatology for follow up of atopic-like dermatitis. At last visit, patient was to continue dupilumab  300 mg every week, start opzelura  and start clobetasol  0.05% ointment for his dermatitis.     Today, patient reports that he has stopped the opzelura  cream as it made his symptoms worse. Notes flares on his lower legs, hands, forearms, back and scalp. Currently taking dupixent  and applying clobetasol  with some improvement.    He feels as though his symptoms worsen after eating fish.     The patient denies any other new or changing lesions or areas of concern.     Pertinent Past Medical History     No history of skin cancer    Family History:   Negative for melanoma    Past Medical History, Family History, Social History, Medication List, Allergies, and Problem List were reviewed in the rooming section of Epic.     ROS: Other than symptoms mentioned in the HPI, no fevers, chills, or other skin complaints    Physical Examination     GENERAL: Well-appearing male in no acute distress, resting comfortably.  NEURO: Alert and oriented, answers questions appropriately  PSYCH: Normal mood and affect  SKIN: Examination of the bilateral upper extremities, bilateral lower legs, chest, abdomen, hands, back and scalp was preformed  - Hyperpigmented papules and plaques on bilateral lower legs  - Erythematous scaly plaques bilateral ventral forearms  - Eczematous papules and plaques on scalp, back, hands, and wrists    All areas not commented on are within normal limits or unremarkable    Baron Idol, PA-S

## 2024-04-14 NOTE — Unmapped (Signed)
 Otolaryngology - Otology/Neurotology New Visit    HPI:  Timothy Tapia is a 58 y.o. male is seen in consultation at the request of Dr. Nicholaus for evaluation of dizziness. Per chart review, patient presented to ED on 02/04/24 for this, and Trenda Craze was positive for oculogyric nystagmus   History of Present Illness  Today, he reports intermittent issues with dizziness, with the most recent episode occurring yesterday. The dizziness is described as a combination of imbalance and lightheadedness, often triggered by bending over and standing up quickly, such as when bending over to pick up something at work or getting up in the morning. No issues with dizziness when turning left or right. He mentions a previous episode that required a hospital visit due to high blood sugar, during which he experienced dizziness that resolved after Epley maneuver were performed, however current symptoms are not similar to previous vertigo.     He has been taking meclizine  for the dizziness, sometimes up to three times a day, but notes that while it helps at times, it is not always effective. He tries to make a 30-pill supply last a month by taking it at night before bed and in the morning before work.    No issues with bright lights, loud noises, or associated headaches. The vertigo episodes last a few seconds and resolve without specific interventions. No issues with pain, drainage, or infection in his ears. No history of trauma or surgery in his ears. No issues with hearing, or hearing changes with dizziness.      PMH:  Past Medical History[1]     PSH:  Past Surgical History[2]     Meds:  Medications Ordered Prior to Encounter[3]    Allergies:  Allergies[4]     FH:  Family History[5]    SH:  Short Social History[6]     ROS:  Review of systems was reviewed on attached notes/patient intake forms.     Exam:  There were no vitals taken for this visit.    General: well appearing, stated age, no distress   Head - atraumatic, normocephalic   Nose: dorsum midline, no rhinorrhea  Neck: symmetric, trachea midline  Psychiatric: alert and oriented, appropriate mood and affect   Respiratory: no audible wheezing or stridor, normal work of breathing  Neurologic - cranial nerves 2-12 grossly intact  Facial Strength - HB 1/6 bilaterally    Ears - External ear- normal, no lesions, no malformations   Otoscopy - Bilateral EAC clear, TM intact  Tuning fork test - N/A    Audiogram: The audiogram was personally reviewed and interpreted. This demonstrates bilateral normal sloping to severe sensorineural hearing loss.       Tympanogram: The tympanogram was personally reviewed and interpreted. This demonstrates type A bilaterally.     Imaging:   MRI Brain w/o contrast: 11/08/21  FINDINGS  There are scattered foci of signal abnormality within the posterior aspects centrum semiovale, periventricular and deep white matter.  These are nonspecific but commonly seen with small vessel ischemic changes. Small chronic lacunar infarct in the left posterior centrum semiovale body. Ventricles are normal in size. There is no midline shift. No extra-axial fluid collection. No evidence of intracranial hemorrhage. No diffusion weighted signal abnormality to suggest acute infarct. No mass.      Mucosal thickening in the left maxillary sinus.     I personally reviewed outside records.    Assessment/Plan:  Timothy Tapia is a 58 y.o. male here for dizziness and hearing  loss. We reviewed his Audiogram, which shows right asymmetric sensorineural hearing loss. He had a prior MRI without contrast in 2023, however, this was hard to assess IAC. We discussed MRI CN8 protocol to assess for retrocochlear pathologies, such as a vestibular schwannoma, which the patient was interested in, so referral was placed. I will reach out with result of scan. For his hearing loss, we also discussed the option for hearing aids, however, he is not interested in this at this time. I will plan to see him back in 1 year with AOA.     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 However, recent episodes of lightheadedness/unsteadiness with bending over and standing up quickly do not sound like an obvious ear etiology. We discussed possible etiology, including orthostatic hypotension, hydration, and stress-related. I recommend following up with his PCP to assess for orthostatic. We also discussed the option for balance therapy, which he was interested in, so a referral was placed. I also recommend against long-term use of meclizine .     The patient/family voiced understanding of the plan as detailed above and is in agreement. I appreciate the opportunity to participate in his care.    I attest to the above information and documentation. However, this note has been created using voice recognition software and may have errors that were not dictated and not seen in editing.          [1]   Past Medical History:  Diagnosis Date    Acute pancreatitis (HHS-HCC)     Barrett esophagus     Black stool 07/08/2017    Bronchitis     Colon polyp     Diabetes mellitus (CMS-HCC)     Eczema     Gastroesophageal reflux disease     HIV disease    (CMS-HCC)     Hypertension     Lack of access to transportation     Pancreatic pseudocyst (HHS-HCC)     Thrombus     IMV thrombus r/t colitis & pancreatitis 10/07/13    Uncircumcised male 05/30/2022    Visual impairment    [2]   Past Surgical History:  Procedure Laterality Date    esophogus surgery      PR CIRCUMCISION,OTHR N/A 09/11/2022    Procedure: CIRCUMCISION, SURGICAL EXCISION OTHER THAN CLAMP, DEVICE OR DORSAL SLIT; OLDER THAN 56 DAYS OF AGE;  Surgeon: Sharl Oliva Rush, MD;  Location: MAIN OR [facility];  Service: Urology    PR COLONOSCOPY W/BIOPSY SINGLE/MULTIPLE  10/11/2013    Procedure: COLONOSCOPY, FLEXIBLE, PROXIMAL TO SPLENIC FLEXURE; WITH BIOPSY, SINGLE OR MULTIPLE;  Surgeon: Luke LITTIE Notch, MD;  Location: [facility]URES [facility];  Service: Gastroenterology    PR COLONOSCOPY W/BIOPSY SINGLE/MULTIPLE N/A 09/29/2014    Procedure: COLONOSCOPY, FLEXIBLE, PROXIMAL TO SPLENIC FLEXURE; WITH BIOPSY, SINGLE OR MULTIPLE;  Surgeon: Arlean Obadiah Spitz, MD;  [facility];  Service: Gastroenterology    PR COLONOSCOPY W/BIOPSY SINGLE/MULTIPLE N/A 09/19/2015    Procedure: COLONOSCOPY, FLEXIBLE, PROXIMAL TO SPLENIC FLEXURE; WITH BIOPSY, SINGLE OR MULTIPLE;  Surgeon: Chauncey Glendia Pfeiffer, MD;  Location: [facility]URES [facility];  Service: Gastroenterology    PR COLSC FLX W/RMVL OF TUMOR POLYP LESION SNARE TQ N/A 09/19/2015    Procedure: COLONOSCOPY FLEX; W/REMOV TUMOR/LES BY SNARE;  Surgeon: Chauncey Glendia Pfeiffer, MD;  Location: [facility]URES [facility];  Service: Gastroenterology    PR COLSC FLX W/RMVL OF TUMOR POLYP LESION SNARE TQ N/A  11/17/2023    Procedure: COLONOSCOPY FLEX; W/REMOV TUMOR/LES BY SNARE;  Surgeon: Sherrill Liliane Olmsted, MD;  [facility];  Service: Gastroenterology    PR ENDOSCOPIC US  EXAM, ESOPH N/A 08/10/2015    Procedure: UGI ENDOSCOPY; WITH ENDOSCOPIC ULTRASOUND EXAMINATION LIMITED TO THE ESOPHAGUS;  Surgeon: Krystal Jona Matter, MD;  [facility]: [facility];  Service: Gastroenterology    PR UPGI ENDOSCOPY,FN NEEDLE BX,GUIDED N/A 09/22/2014    Procedure: UGI W/TRANSENDOSCOPIC US -GUIDE INTRA/TRANSMURAL NEEDLE ASP/BX (INCL EXAM ESOPHAGUS, STOMACH, DOUDENUM/JEJ);  Surgeon: Krystal VEAR Matter, MD;  Location: [facility]URES [facility];  Service: Gastroenterology    PR UPPER GI ENDOSCOPY,BIOPSY N/A 09/19/2015    Procedure: UGI ENDOSCOPY; WITH BIOPSY, SINGLE OR MULTIPLE;  Surgeon: Chauncey Glendia Pfeiffer, MD;  Location: [facility];  Service: Gastroenterology    PR UPPER GI ENDOSCOPY,BIOPSY N/A 07/14/2017    Procedure: UGI ENDOSCOPY; WITH BIOPSY, SINGLE OR MULTIPLE;  Surgeon: Eleanor Dewey Sorrel, MD;  Location: [facility];  Service: Gastroenterology    PR UPPER GI ENDOSCOPY,BIOPSY N/A 12/09/2023    Procedure: UGI ENDOSCOPY; WITH BIOPSY, SINGLE OR MULTIPLE;  Surgeon: Lucian Mikle Pfeiffer, MD;  Location: [facility];  Service: Gastroenterology   [3]   Current Outpatient Medications on File Prior to Visit   Medication Sig Dispense Refill    albuterol  HFA 90 mcg/actuation inhaler Inhale 2 puffs by mouth prior to study CT scan 8.5 g 0    amlodipine  (NORVASC ) 5 MG tablet TAKE ONE TABLET BY MOUTH DAILY. 30 tablet 9    atorvastatin  (LIPITOR) 20 MG tablet TAKE ONE TABLET BY MOUTH DAILY. 30 tablet 9    beclomethasone (BECONASE  AQ) 42 mcg (0.042 %) nasal spray Place 2 sprays into each nostril two (2) times a day. (Patient not taking: Reported on 12/09/2023) 25 g 9    betamethasone , augmented, (DIPROLENE ) 0.05 % ointment APPLY TWICE DAILY TO ACTIVE AREAS UNTIL SMOOTH 100 g 5    blood sugar diagnostic (GLUCOSE BLOOD) Strp Check blood sugar as directed once a day and for symptoms of high or low blood sugar. Contour Next. 100 strip 6    blood-glucose meter kit Use as directed to test blood sugar.  Disp. blood glucose meter kit preferred by patient's insurance. Dx: Diabetes, E11.9. Does not want accu-chek guide 1 each 11    cetirizine  (ZYRTEC ) 10 MG tablet TAKE ONE TABLET BY MOUTH DAILY. 30 tablet 9    clobetasol  (TEMOVATE ) 0.05 % ointment Apply topically two (2) times a day. To active areas until smooth. Not for long term continuous use. Not for face or skin folds 120 g 5    [EXPIRED] clotrimazole  (MYCELEX ) 10 mg troche Take 1 tablet (10 mg total) by mouth five (5) times a day for 7 days. 35 tablet 0    CREON  24,000-76,000 -120,000 unit CpDR delayed release capsule TAKE TWO CAPSULES BY MOUTH THREE TIMES DAILY WITH MEALS AND ONE CAPSULE with snacks. 210 capsule 9    cyclobenzaprine  (FLEXERIL ) 10 MG tablet Take 1 tablet (10 mg total) by mouth.      darunavir -cobicistat  (PREZCOBIX ) 800-150 mg-mg tablet TAKE ONE TABLET BY MOUTH DAILY 90 tablet 3    diclofenac (VOLTAREN) 75 MG EC tablet Take 1 tablet (75 mg total) by mouth.      dolutegravir  (TIVICAY ) 50 mg TABLET Take 1 tablet (50 mg total) by mouth daily. 30 tablet 9    dolutegravir  (TIVICAY ) 50 mg TABLET TAKE ONE TABLET BY MOUTH DAILY 90  tablet 3    dupilumab  (DUPIXENT  PEN) 300 mg/2 mL pen injector Inject the contents of 1 pen (300 mg total) under the skin every seven (7) days. 8 mL 11    empagliflozin  (JARDIANCE ) 10 mg tablet Take 1 tablet (10 mg total) by mouth daily. 90 tablet 2    empty container Misc Use as directed to dispose of Dupixent  pens. 1 each 2    enalapril  (VASOTEC ) 20 MG tablet TAKE ONE TABLET BY MOUTH EVERY DAY 90 tablet 3    ferrous fumarate  325 mg (106 mg iron ) Tab Take 1 tablet by mouth in the morning. 30 tablet 3    gabapentin  (NEURONTIN ) 300 MG capsule TAKE THREE CAPSULES BY MOUTH TWICE DAILY. 270 capsule 9    lancets (ACCU-CHEK SOFTCLIX LANCETS) Misc USE TO CHECK BLOOD SUGAR ONCE DAILY FOR  SYMPTOMS  OF  HIGH  OR  LOW  BLOOD  SUGAR  Please dispense lancets for Contour meter. 100 each 3    lancets Misc USE TO CHECK BLOOD SUGAR ONCE DAILY FOR SYMPTOMS OF HIGH OR LOW BLOOD SUGAR 100 each 3    meclizine  (ANTIVERT ) 25 mg tablet TAKE ONE TABLET BY MOUTH THREE TIMES DAILY AS NEEDED FOR dizziness. 30 tablet 5    metFORMIN  (GLUCOPHAGE -XR) 500 MG 24 hr tablet Take 1 tablet (500 mg total) by mouth daily before breakfast. 90 tablet 3    mirtazapine  (REMERON ) 30 MG tablet TAKE ONE TABLET BY MOUTH DAILY. 30 tablet 9    pantoprazole  (PROTONIX ) 20 MG tablet TAKE ONE TABLET BY MOUTH EVERY DAY 90 tablet 3    sildenafil  (VIAGRA ) 100 MG tablet Take 1 tablet (100 mg total) by mouth once as needed for erectile dysfunction. Take one half to one whole tablet 30 minutes prior to sex. 10 tablet 5     Current Facility-Administered Medications on File Prior to Visit   Medication Dose Route Frequency Provider Last Rate Last Admin    triamcinolone  acetonide (KENALOG -40) injection 40 mg  40 mg Intra-articular Once Northup, Courtney Jean, PA       [4]   Allergies  Allergen Reactions    Sulfa (Sulfonamide Antibiotics) Hives    Sulfur Hives    Fish Containing Products Hives and Itching   [5]   Family History  Problem Relation Age of Onset    Hypertension Mother     Diabetes Mother     COPD Father     Pancreatic cancer Neg Hx     Pancreatitis Neg Hx     Melanoma Neg Hx     Basal cell carcinoma Neg Hx     Squamous cell carcinoma Neg Hx    [6]   Social History  Tobacco Use    Smoking status: Every Day     Current packs/day: 0.25     Average packs/day: 0.3 packs/day for 35.9 years (9.2 ttl pk-yrs)     Types: Cigarettes     Start date: 06/28/1993     Passive exposure: Current    Smokeless tobacco: Never    Tobacco comments:     1 pack lasts 4 days; referred to [facility]; Taking nicorette gum   Vaping Use    Vaping status: Never Used   Substance Use Topics    Alcohol use: Not Currently    Drug use: Not Currently     Types: Marijuana     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

## 2024-04-20 ENCOUNTER — Ambulatory Visit
Admit: 2024-04-20 | Discharge: 2024-04-21 | Payer: Medicare (Managed Care) | Attending: Audiologist | Primary: Audiologist

## 2024-04-20 DIAGNOSIS — H903 Sensorineural hearing loss, bilateral: Principal | ICD-10-CM

## 2024-04-20 NOTE — Unmapped (Signed)
 [facility]  Department of Audiology    AUDIOLOGIC EVALUATION REPORT     PATIENT: Timothy Tapia, Timothy Tapia  DOB: [birth year]  MRN: [id number]  DOS: 04/20/2024    PLEASE SEE AUDIOGRAM INCLUDING FULL REPORT IN MEDIA MANAGER TAB.    HISTORY     [Pain 0/10]  Phillp Dolores is a 58 y.o. individual seen by Audiology for a hearing evaluation in conjunction with ENT visit; patient is seeing Bao Thi Tran, FNP today. Patient was unaccompanied to today's appointment. His medical history is significant for dizziness with changes in position. Patient denies ear pain, drainage, tinnitus, difficulty hearing, and past ear surgeries at today's appointment.    RESULTS     Otoscopy  RIGHT Ear: clear external auditory canal  LEFT Ear: clear external auditory canal    Tympanometry - using a 226 Hz probe tone  RIGHT Ear: Type A tympanogram, consistent with normal middle ear pressure, compliance, and volume  LEFT Ear: Type A tympanogram, consistent with normal middle ear pressure, compliance, and volume    Pure Tone and Speech Audiometry  Today's behavioral evaluation was completed using conventional audiometry via insert earphones with good reliability.     Audiometric Results:      RIGHT Ear: Normal hearing sensitivity to severe sensorineural hearing loss  Speech Reception Threshold (SRT): 45 dB HL  Word Recognition Score (using Recorded NU-6 words): 84% at 90 dB HL    LEFT Ear: Normal hearing sensitivity to severe sensorineural hearing loss  Speech Reception Threshold (SRT): 25 dB HL  Word Recognition Score (using Recorded NU-6 words): 92% at 85 dB HL    IMPRESSIONS     An asymmetry is noted in thresholds between ears.       Patient was counseled on today's results and expressed understanding.    RECOMMENDATIONS      ENT - Seeing Cozetta Lacy Camp, FNP today   Re-evaluate hearing per ENT request, sooner should concerns arise   Consider Hearing Aid (HA) consultation with [facility] Adult Audiology team    MARDY JONELLE BIDDING, AUD   Clinical Audiologist  [facility]  Scheduling [phone]    Charges associated with this visit:  CPT 92557 - Comprehensive Audio Eval & Speech Recognition  CPT [phone] - Tympanometry    Visit Time: 45 min    Access your Audiogram via MyChart:  - Log into myChart: Menu > Document Center > Medical Record Requests  - Click the hyperlink 'Request Medical Records'   - Fill out fields + Select Audiograms option under 'Specific Diagnostic Images' > Submit

## 2024-04-21 NOTE — Unmapped (Signed)
 Addended by: LACY CAMP, Valerian Jewel on: 04/21/2024 01:32 PM     Modules accepted: Orders

## 2024-04-22 DIAGNOSIS — E1165 Type 2 diabetes mellitus with hyperglycemia: Principal | ICD-10-CM

## 2024-04-22 MED ORDER — BLOOD-GLUCOSE METER KIT WRAPPER
ORAL | 11 refills | 0.00000 days | Status: CP
Start: 2024-04-22 — End: 2025-04-22

## 2024-04-22 NOTE — Unmapped (Signed)
*  Patient's copay was on mychart message sent to patient. [facility] Refill Coordination Note    Melton Walls, DOB: 11-Dec-1965  Phone: [phone] (home) [phone] (work)      All above HIPAA information was verified with patient.         04/21/2024     8:54 AM   Specialty Rx Medication Refill Questionnaire   Which Medications would you like refilled and shipped? Dupicent   Please list all current allergies: Sulfur   Have you missed any doses in the last 30 days? No   Have you had any changes to your medication(s) since your last refill? No   How much of each medication do you have remaining at home? (eg. number of tablets, injections, etc.) 1   If receiving an injectable medication, next injection date is 09-16-1965   Have you experienced any side effects in the last 30 days? No   Please enter the full address (street address, city, state, zip code) where you would like your medication(s) to be delivered to. [address], [place], [place] [address]   Please specify on which day you would like your medication(s) to arrive. Note: if you need your medication(s) within 3 days, please call the pharmacy to schedule your order at [phone]  July 23, 1965   Has your insurance changed since your last refill? No   Would you like a pharmacist to call you to discuss your medication(s)? No   Do you require a signature for your package? (Note: if we are billing Medicare Part B or your order contains a controlled substance, we will require a signature) No   I have been provided my out of pocket cost for my medication and approve the pharmacy to charge the amount to my credit card on file. No   Additional Comments: Thank you         Completed refill call assessment today to schedule patient's medication shipment from the [facility] [phone]).  All relevant notes have been reviewed.       Confirmed patient received a Conservation officer, historic buildings and a Surveyor, mining with first shipment. The patient will receive a drug information handout for each medication shipped and additional FDA Medication Guides as required.         REFERRAL TO PHARMACIST     Referral to the pharmacist: Not needed      [facility]     Shipping address confirmed in Epic.     Delivery Scheduled: Yes, Expected medication delivery date: 10/27.     Medication will be delivered via Same Day Courier to the prescription address in Epic WAM.    Eleanor JAYSON [facility] Specialty Technician

## 2024-04-26 DIAGNOSIS — L309 Dermatitis, unspecified: Principal | ICD-10-CM

## 2024-04-26 MED FILL — DUPIXENT 300 MG/2 ML SUBCUTANEOUS PEN INJECTOR: SUBCUTANEOUS | 28 days supply | Qty: 8 | Fill #7

## 2024-04-29 NOTE — Progress Notes (Signed)
 Referral Services Note     Duration of Intervention: 15 minutes    TYPE OF CONTACT: Phone    ASSESSMENT: SW received a message stating that the patient had questions regarding health insurance.    INTERVENTION:   SW contacted the patient to obtain additional information regarding his request. The patient has signed up for an insurance plan named [employer]. He explained that he became aware of the insurance while shopping at [employer]. There was an individual signing people up, and they explained that the plan was very beneficial. The patient shares that they offer assistance items within their coverage.     The patient became concerned due to being unable to locate his medical provider in their system.      PLAN: SW instructed the patient to contact the health care navigators to assist him with his insurance questions. SW also plans to speak with other [facility] staff members regarding the insurance plan and its effectiveness.    [place] [facility]  [phone]     Clotilda Linnie SIERRAS  [facility] Social Work

## 2024-05-03 ENCOUNTER — Ambulatory Visit: Admit: 2024-05-03 | Payer: Medicare (Managed Care)

## 2024-05-04 MED ORDER — SILDENAFIL 100 MG TABLET
ORAL_TABLET | Freq: Once | ORAL | 5 refills | 0.00000 days | PRN
Start: 2024-05-04 — End: ?

## 2024-05-04 NOTE — Telephone Encounter (Signed)
 Medication Requested: sildenafil       Future Appointments   Date Time Provider Department Center   07/05/2024  8:45 AM Fayette Ozell Fallow, DO [facility]   10/12/2024 10:15 AM Shlomo Camie Bohr, PA [facility]   11/08/2024  9:00 AM Jinny Alm Seta, MD [facility]     Per Provider Note: not mentioned in last note but remains on active med list    Standing order protocol requirements met?: No    Sent to: Provider for signing    Days Supply Given:   Number of Refills:

## 2024-05-05 MED ORDER — SILDENAFIL 100 MG TABLET
ORAL_TABLET | Freq: Once | ORAL | 5 refills | 0.00000 days | Status: CP | PRN
Start: 2024-05-05 — End: ?

## 2024-05-06 ENCOUNTER — Ambulatory Visit

## 2024-05-06 DIAGNOSIS — E119 Type 2 diabetes mellitus without complications: Secondary | ICD-10-CM | POA: Diagnosis not present

## 2024-05-06 DIAGNOSIS — B351 Tinea unguium: Secondary | ICD-10-CM

## 2024-05-06 DIAGNOSIS — B353 Tinea pedis: Secondary | ICD-10-CM | POA: Diagnosis not present

## 2024-05-06 MED ORDER — TERBINAFINE HCL 250 MG PO TABS: 250.0000 mg | ORAL_TABLET | Freq: Every day | ORAL | 0 refills | Status: AC

## 2024-05-06 MED ORDER — TERBINAFINE HCL 250 MG PO TABS: 250.0000 mg | ORAL_TABLET | Freq: Every day | ORAL | 0 refills | Status: DC

## 2024-05-06 NOTE — Addendum Note (Signed)
 Addended by: MAGDALEN BARTER on: 05/06/2024 10:29 AM   Modules accepted: Orders

## 2024-05-06 NOTE — Progress Notes (Signed)
  Subjective:  Patient ID: TREMELL REIMERS, male    DOB: Aug 31, 1965,  MRN: [id number]  58 y.o. male presents with chief concern of diabetes with elongated, thickened, painful, discolored toenails for months. Aggravating factor(s) include movement and palpation. Patient has tried self attempt at trimming toenail. Patient is interested in diabetic shoes. Most recent A1c 9.2% based on lab value in his phone.   Chief Complaint  Patient presents with   Diabetes    New pt-diabetic exam and foot care.    [facility]   Allergies  Allergen Reactions   Elemental Sulfur Hives   Sulfa Antibiotics     Review of Systems: Negative except as noted in the HPI.   Objective:  JOSEANGEL NETTLETON is a pleasant 58 y.o. male in NAD. AAO x 3.  Vascular Examination: Vascular status intact b/l with palpable pedal pulses. CFT immediate b/l. Pedal hair present. No edema. No pain with calf compression b/l. Skin temperature gradient WNL b/l. No varicosities noted. No cyanosis or clubbing noted.  Neurological Examination: Sensation grossly intact b/l with 10 gram monofilament. Negative tinel sign at tarsal tunnel bilaterally.   Dermatological Examination: Bilateral pedal skin has minor peeling and erythema in moccasin type distribution consistent with tinea pedis.  No open wounds nor interdigital macerations noted. Toenails 1-5 b/l thick, discolored, elongated with subungual debris and pain on dorsal palpation. No hyperkeratotic lesions noted b/l.   Musculoskeletal Examination: Muscle strength 5/5 to b/l LE.  No pain, crepitus noted b/l. No gross pedal deformities. Patient ambulates independently without assistive aids.   Radiographs: None     Assessment:   1. Diabetes mellitus type 2 in nonobese (HCC)   2. Onychomycosis   3. Tinea pedis of both feet     Plan:  Patient was evaluated and treated. All patient's and/or POA's questions/concerns addressed on today's visit. Toenails 1-5 b/l debrided  in length and girth without incident. Continue soft, supportive shoe gear daily. Report any pedal injuries to medical professional. Call office if there are any questions/concerns. -Patient/POA to call should there be question/concern in the interim. - Terbinafine 90 day x 250mg  once daily prescribed for onychomycosis and tinea pedis. Most recent CMP shows normal liver function 11/03/23.  - Prescription for diabetic shoes dispensed - RTC 6 weeks for progress check and likely CMP to evaluate liver funciton  RTC 6 weeks  Prentice Ovens, DPM AACFAS Fellowship Trained Podiatric Surgeon [facility]       [place] LOCATION: [address], [place] [address]                   Office [phone]   [facility] LOCATION: [address] [place], [place] [address] Office [phone]

## 2024-05-07 ENCOUNTER — Ambulatory Visit: Admitting: Podiatry

## 2024-05-07 DIAGNOSIS — E1165 Type 2 diabetes mellitus with hyperglycemia: Principal | ICD-10-CM

## 2024-05-13 NOTE — Telephone Encounter (Signed)
 Patient left a VM thru the nurse line to check on DM shoes.

## 2024-05-17 NOTE — Progress Notes (Signed)
 [facility] Refill Coordination Note    Specialty Medication(s) to be Shipped:   Inflammatory Disorders: Dupixent     Other medication(s) to be shipped: No additional medications requested for fill at this time    Specialty Medications not needed at this time: N/A     Timothy Tapia, DOB: Nov 20, 1965  Phone: There are no phone numbers on file.      All above HIPAA information was verified with patient.     Was a nurse, learning disability used for this call? No    Completed refill call assessment today to schedule patient's medication shipment from the [facility]  [phone]).  All relevant notes have been reviewed.     Specialty medication(s) and dose(s) confirmed: Regimen is correct and unchanged.   Changes to medications: [name] reports no changes at this time.  Changes to insurance: No  New side effects reported not previously addressed with a pharmacist or physician: None reported  Questions for the pharmacist: No    Confirmed patient received a Conservation Officer, Historic Buildings and a Surveyor, Mining with first shipment. The patient will receive a drug information handout for each medication shipped and additional FDA Medication Guides as required.       DISEASE/MEDICATION-SPECIFIC INFORMATION        For patients on injectable medications: Next injection is scheduled for 11/20.    SPECIALTY MEDICATION ADHERENCE     Medication Adherence    Patient reported X missed doses in the last month: 0  Specialty Medication: dupilumab : DUPIXENT  PEN 300 mg/2 mL pen injector  Patient is on additional specialty medications: No  Patient is on more than two specialty medications: No  Any gaps in refill history greater than 2 weeks in the last 3 months: no  Demonstrates understanding of importance of adherence: yes  Informant: patient  Reliability of informant: reliable  Provider-estimated medication adherence level: good  Patient is at risk for Non-Adherence: No  Reasons for non-adherence: no problems identified  Confirmed plan for next specialty medication refill: delivery by pharmacy  Refills needed for supportive medications: not needed          Refill Coordination    Has the Patients' Contact Information Changed: No  Is the Shipping Address Different: No         Were doses missed due to medication being on hold? No    Dupixent   300/2 mg/ml: 1 doses of medicine on hand       REFERRAL TO PHARMACIST     Referral to the pharmacist: Not needed      [facility]     Shipping address confirmed in Epic.     Cost and Payment: Patient has a $0 copay, payment information is not required.    Delivery Scheduled: Yes, Expected medication delivery date: 11/21.     Medication will be delivered via Same Day Courier to the prescription address in Epic WAM.    Timothy Tapia   [facility]  Specialty Technician

## 2024-05-18 ENCOUNTER — Telehealth: Payer: Self-pay

## 2024-05-18 NOTE — Telephone Encounter (Signed)
 Patient states that [facility] needs a copy of the foot evaluation notes.

## 2024-05-21 MED FILL — DUPIXENT 300 MG/2 ML SUBCUTANEOUS PEN INJECTOR: SUBCUTANEOUS | 28 days supply | Qty: 8 | Fill #8

## 2024-05-25 ENCOUNTER — Telehealth: Payer: Self-pay

## 2024-05-25 NOTE — Telephone Encounter (Signed)
 Patient came in to [facility] wanting a RX for diabetic shoes to be sent to [employer].

## 2024-05-26 ENCOUNTER — Ambulatory Visit (INDEPENDENT_AMBULATORY_CARE_PROVIDER_SITE_OTHER)

## 2024-05-26 DIAGNOSIS — E119 Type 2 diabetes mellitus without complications: Secondary | ICD-10-CM

## 2024-05-26 DIAGNOSIS — B351 Tinea unguium: Secondary | ICD-10-CM

## 2024-05-26 DIAGNOSIS — B353 Tinea pedis: Secondary | ICD-10-CM | POA: Diagnosis not present

## 2024-05-26 NOTE — Progress Notes (Signed)
  Subjective:  Patient ID: Mason Martin, male    DOB: [birth year],  MRN: [id number] Patient returns to clinic today for repeat evaluation and for diabetic shoe for.  He states that he was unable to diabetic shoes with the information he was provided at last visit.  Interval history 05/06/24: 58 y.o. male presents with chief concern of diabetes with elongated, thickened, painful, discolored toenails for months. Aggravating factor(s) include movement and palpation. Patient has tried self attempt at trimming toenail. Patient is interested in diabetic shoes. Most recent A1c 9.2% based on lab value in his phone.   No chief complaint on file.   [facility]   Allergies  Allergen Reactions   Elemental Sulfur Hives   Sulfa Antibiotics     Review of Systems: Negative except as noted in the HPI.   Objective:  Mason Martin is a pleasant 58 y.o. male in NAD. AAO x 3.  Vascular Examination: Vascular status intact b/l with palpable pedal pulses. CFT immediate b/l. Pedal hair present. No edema. No pain with calf compression b/l. Skin temperature gradient WNL b/l. No varicosities noted. No cyanosis or clubbing noted.  Neurological Examination: Sensation grossly intact b/l with 10 gram monofilament. Negative tinel sign at tarsal tunnel bilaterally.   Dermatological Examination: Significant improvement in peeling and erythema.  No open wounds nor interdigital macerations noted. Toenails 1-5 b/l thick, discolored, with subungual debris and pain on dorsal palpation. No change in proximal nail. No hyperkeratotic lesions noted b/l.   Musculoskeletal Examination: Muscle strength 5/5 to b/l LE.  Pain to palpation of right dorsal midfoot without osseous prominence. No crepitus noted b/l. No gross pedal deformities. Patient ambulates independently without assistive aids.   Radiographs: None     Assessment:   1. Diabetes mellitus type 2 in nonobese (HCC)   2. Onychomycosis   3. Tinea pedis  of both feet      Plan:  Patient was evaluated and treated. All patient's and/or POA's questions/concerns addressed on today's visit. Continue soft, supportive shoe gear daily. Report any pedal injuries to medical professional. Call office if there are any questions/concerns. -Patient/POA to call should there be question/concern in the interim. - Patient continuing to take terbinafine  without issue - Prescription for diabetic shoes dispensed and notes printed. These will help to support his arch and decrease midfoot arthritis pain. It will also help to decrease chance of callus formation.    Prentice Ovens, DPM AACFAS Fellowship Trained Podiatric Surgeon [facility]       [place] LOCATION: [address] [place] [address]                   Office [phone]   [facility] LOCATION: [address] [place], [place] [address] Office [phone]

## 2024-05-30 DIAGNOSIS — L309 Dermatitis, unspecified: Principal | ICD-10-CM

## 2024-05-30 MED ORDER — BETAMETHASONE, AUGMENTED 0.05 % TOPICAL OINTMENT
5 refills | 0.00000 days
Start: 2024-05-30 — End: ?

## 2024-05-31 MED ORDER — BETAMETHASONE, AUGMENTED 0.05 % TOPICAL OINTMENT
TOPICAL | 5 refills | 0.00000 days | Status: CP
Start: 2024-05-31 — End: ?

## 2024-05-31 NOTE — Telephone Encounter (Signed)
 Medication Requested: betamethasone , augmented, (DIPROLENE ) 0.05 % ointment       Future Appointments   Date Time Provider Department Center   07/05/2024  8:45 AM Fayette Ozell Fallow, DO [facility]   07/26/2024  9:40 AM Perla Lonni Barter, DPM [facility]OD[facility]   10/12/2024 10:15 AM Shlomo Camie Bohr, PA [facility]   11/08/2024  9:00 AM Jinny Alm Seta, MD [facility]     Per Provider Note: in med list    Standing order protocol requirements met?: No    Sent to: Provider for signing    Days Supply Given: TBD  Number of Refills: TBD

## 2024-06-02 NOTE — Telephone Encounter (Signed)
 Patient called about diabetic shoe paperwork he dropped off for Fayette to sing and send to [facility]. Nothing has been sent and nothing resolved .

## 2024-06-04 NOTE — Telephone Encounter (Signed)
 Pt dropped off a form on for Dr. Fayette to fill out. It is so he can get diabetic shoes and he is still waiting for it to be filled out. Pt left a message and no response. Please call the pt to let him know the status. Thank you.

## 2024-06-04 NOTE — Telephone Encounter (Signed)
 Faxed foot form to [facility] at [phone]

## 2024-06-07 NOTE — Telephone Encounter (Signed)
 Faxed foot form/order to [employer] at [phone].

## 2024-06-09 NOTE — Telephone Encounter (Signed)
 Thank you for letting me know it has been resent.

## 2024-06-09 NOTE — Progress Notes (Addendum)
 This pharmacist was notified by a technician that this patient would like to be contacted by the pharmacist per their MyChart questionnaire response.. I have reached out to the patient and have determined that no further pharmacist action is needed. Patient indicated he marked that option by mistake and has no questions/concerns.       Approximate time spent: 0-5 minutes    Gerard DELENA Sharps, Clinical Specialty Pharmacist  [facility]            06/09/2024 - Patient answered No to charging their debit/credit card in their questionnaire response. Currently, they do not have a copay for the medication(s) listed below. Debit/Credit card not needed at this time.  [facility] Pharm[facility] Refill Coordination Note    Timothy Tapia, Timothy Tapia: 06-05-1966  Phone: There are no phone numbers on file.      All above HIPAA information was verified with patient.         06/09/2024     2:22 PM   Specialty Rx Medication Refill Questionnaire   Which Medications would you like refilled and shipped? Exma shot   Please list all current allergies: Sulfur   Have you missed any doses in the last 30 days? No   Have you had any changes to your medication(s) since your last refill? No   How much of each medication do you have remaining at home? (eg. number of tablets, injections, etc.) 1   If receiving an injectable medication, next injection date is 06/10/2024   Have you experienced any side effects in the last 30 days? No   Please enter the full address (street address, city, state, zip code) where you would like your medication(s) to be delivered to. [address]  [place]  [address]   Please specify on which day you would like your medication(s) to arrive. Note: if you need your medication(s) within 3 days, please call the pharmacy to schedule your order at [phone]  06/11/2024   Has your insurance changed since your last refill? No   Would you like a pharmacist to call you to discuss your medication(s)? Yes   Please enter the preferred phone number where you can be reached. [phone]   Do you require a signature for your package? (Note: if we are billing Medicare Part B or your order contains a controlled substance, we will require a signature) No   I have been provided my out of pocket cost for my medication and approve the pharmacy to charge the amount to my credit card on file. No         Completed refill call assessment today to schedule patient's medication shipment from the [facility] [phone]).  All relevant notes have been reviewed.       Confirmed patient received a Conservation Officer, Historic Buildings and a Surveyor, Mining with first shipment. The patient will receive a drug information handout for each medication shipped and additional FDA Medication Guides as required.         REFERRAL TO PHARMACIST     Referral to the pharmacist: Yes - patient request      SHIPPING     Shipping address confirmed in Epic.     Delivery Scheduled: Yes, Expected medication delivery date: 06/11/24.     Medication will be delivered via Next Day Courier to the prescription address in Epic WAM.    Timothy Tapia   [facility]  [facility] Delivery Pharm[facility] Specialty Technician

## 2024-06-10 MED FILL — DUPIXENT 300 MG/2 ML SUBCUTANEOUS PEN INJECTOR: SUBCUTANEOUS | 28 days supply | Qty: 8 | Fill #9

## 2024-06-15 NOTE — Telephone Encounter (Signed)
 The PAC has received an incoming call requesting medication refill/clarification/question:    Caller: Timothy Tapia    Type of request (refill, clarification, question): refill  Name of medication:   albuterol  HFA 90 mcg/actuation inhaler     Desired pharmacy:   [facility] [address] ([address]), [place] - [address] Phone: [phone]   Fax: [phone]

## 2024-06-17 MED ORDER — ALBUTEROL SULFATE HFA 90 MCG/ACTUATION AEROSOL INHALER
RESPIRATORY_TRACT | 0 refills | 0.00000 days | Status: CP
Start: 2024-06-17 — End: ?

## 2024-07-05 ENCOUNTER — Ambulatory Visit
Admit: 2024-07-05 | Discharge: 2024-07-06 | Payer: MEDICAID | Attending: Student in an Organized Health Care Education/Training Program | Primary: Student in an Organized Health Care Education/Training Program

## 2024-07-05 DIAGNOSIS — E782 Mixed hyperlipidemia: Principal | ICD-10-CM

## 2024-07-05 DIAGNOSIS — E1165 Type 2 diabetes mellitus with hyperglycemia: Principal | ICD-10-CM

## 2024-07-05 DIAGNOSIS — I10 Essential (primary) hypertension: Principal | ICD-10-CM

## 2024-07-05 LAB — LIPID PANEL
CHOLESTEROL: 152 mg/dL (ref <200)
HDL CHOLESTEROL: 57 mg/dL (ref >40)
LDL CHOLESTEROL CALCULATED: 85 mg/dL (ref <100)
NON-HDL CHOLESTEROL: 95 mg/dL (ref <130)
TRIGLYCERIDES: 66 mg/dL (ref <150)

## 2024-07-05 MED ORDER — EMPAGLIFLOZIN 10 MG TABLET
ORAL_TABLET | Freq: Every day | ORAL | 2 refills | 90.00000 days | Status: CP
Start: 2024-07-05 — End: 2025-04-01

## 2024-07-05 MED ORDER — METFORMIN ER 500 MG TABLET,EXTENDED RELEASE 24 HR
ORAL_TABLET | Freq: Every day | ORAL | 3 refills | 90.00000 days | Status: CP
Start: 2024-07-05 — End: 2025-06-30

## 2024-07-05 NOTE — Patient Instructions (Signed)
 Congratulations on your excellent improvements in glucose control!. Your A1c is at our goal of <7%     - Continue your current medications: Metformin  and Jardiance      - Continue to avoid regular sodas and excess sugars.

## 2024-07-05 NOTE — Progress Notes (Signed)
 [facility] [place] [address]  [address]  [name] [address]  Phone: [phone]  Fax: [phone]    Assessment/Plan:     Problem List Items Addressed This Visit          Cardiovascular and Mediastinum    Essential (primary) hypertension       Endocrine    Type 2 diabetes mellitus with hyperglycemia, without long-term current use of insulin (CMS-HCC) - Primary    Relevant Medications    metFORMIN  (GLUCOPHAGE -XR) 500 MG 24 hr tablet    empagliflozin  (JARDIANCE ) 10 mg tablet    Other Relevant Orders    Lipid Panel       Other    Mixed hyperlipidemia    Relevant Orders    Lipid Panel     Timothy Tapia is a 59 y.o. male who presents for f/u of Type 2 diabetes mellitus. With their age, complications and current regimen an appropriate A1c goal is <7.5%. They are currently meeting this goal.     Significant improvement in glucose parameters with medication resumption and dietary modifications. Applauded efforts today, no changes.     -  Metformin  XR 500mg  daily.  - Jardiance  10mg  daily.      Avoiding GLP1ra with history of recurrent pancreatitis. Discussed potential utility in CGM analysis if glucose readings do not respond to above intervention.     Monitoring for diabetes complications:  Renal: None. Albuminuria screen Feb 2025 negative.   Retinal: None. Eye exam UTD Feb 2025   Neurological: Yes, known neuropathy with callus, following with Podiatry.  Follow up scheduled later this month.     CVD: None.               BP at goal on current regimen including ACEi              Lipids: Managed on Atorvastatin  20mg  daily. Repeat fasting lipid panel today for monitoring.   Fib:4 <1     Follow up with Endocrinologist in about 6 months (around 01/02/2025).    PCP: Jinny Alm Seta, MD    CC: Follow up Type 2 Diabetes    Subjective:    Timothy Tapia is a 59 y.o. male with PMH HTN, HIV on ART, substance use disorder,pancreatitis who presents for f/u of Type 2 diabetes mellitus.  Last seen 4 months ago.    Diabetes History   Diagnosed:   - Patient unclear regarding initial diagnosis, but diagnosed since at least 2021. Well controlled for several years.   - History notable for multiple episodes of acute pancreatitis related to alcohol use (7985,7982). Denies resulting diabetes but has required chronic creon  use.   - Abrupt worsening of hyperglycemia at the end of 2024 for unclear reasons. Denies substance use during this time. Challenges with regular soda intake but has stopped in recent months.   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 No known ulcers or foot wounds.   No known retinopathy or nephropathy.     Previous treatments:  - Actos   - Metformin  dose limited by GI side effect     Diet:   - Reports challenges with sweet cravings since being sober. High use of regular soda in 2024, has switched to low or zero sugar alternatives in the new year.      Exercise   - Limited by  chronic back pain.      Social History:   - Current smoker, 0.3 ppd.   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 He has been dealing with foot pain and following closely with Podiatry. He is hoping to obtain foot inserts due to neuropathy symptoms at work.   - Continues to avoid regular soda intake, some challenges with sugar craving.   - Denies associated side effect with medication resumption.     Currently taking:   Metformin  500mg  once daily.   Jardiance  10mg  daily     Glucose data downloaded and reviewed as displayed in media and below.   POC glucose 112 fasting.     Lab Results   Component Value Date    A1C 5.7 07/05/2024    A1C 9.2 (H) 02/23/2024    A1C 6.2 (H) 11/03/2023    A1C >14.0 (H) 06/17/2023     Wt Readings from Last 3 Encounters:   07/05/24 65.8 kg (145 lb)   04/20/24 66.8 kg (147 lb 3.2 oz)   02/23/24 64.4 kg (142 lb)     Past Medical History[1]     The patient's past medical history, medications, and drug allergies were reviewed and, as needed, updated as part of this visit.    Review of Systems  No chest pain, SOB, DOE, swelling, vision changes, or paresthesias. No feet concerns. No new skin problems.      Objective:        BP 125/68 (BP Site: L Arm, BP Position: Sitting, BP Cuff Size: Medium)  - Pulse 69  - Resp 16  - Ht 162.6 cm (5' 4)  - Wt 65.8 kg (145 lb)  - BMI 24.89 kg/m??     General appearance:  Alert, pleasant, in no distress    Eyes:  No stare or lid edema. Sclera anicteric.       Neck:  supple,  trachea midline, no cervical adenopathy   Lung: No increased work of breathing, clear to auscultation bilaterally   Heart:  regular rate and rhythm   Extremities: No LE edema   Skin: warm and dry, no rashes       Neuro: No focal deficits, no hand tremors, gait stable          Lab Review    Lab Results   Component Value Date    NA 138 02/04/2024    K 4.2 02/04/2024    CL 104 02/04/2024    CO2 23.0 02/04/2024    BUN 19 02/04/2024    CREATININE 0.70 (L) 02/04/2024    GFRNONAA 99 05/13/2019    GFRAA 115 05/13/2019    CALCIUM 8.9 02/04/2024    ALBUMIN 3.4 11/03/2023    PHOS 4.1 01/24/2014       Lab Results   Component Value Date    CHOL 184 09/09/2022     Lab Results   Component Value Date    LDL 127 (H) 09/09/2022     Lab Results   Component Value Date    HDL 43 09/09/2022     Lab Results   Component Value Date    TRIG 72 09/09/2022     Lab Results   Component Value Date    ALT 15 11/03/2023       Lab Results   Component Value Date    CREATUR  21.1 08/05/2023     Lab Results   Component Value Date    Albumin Quantitative, Urine <0.3 08/05/2023    Albumin/Creatinine Ratio  08/05/2023      Comment:      Unable to calculate.     No results found for: LABCREA, MICROALBQTUR, MSHCGMOM, MALBCRERAT  No results found for: RAFORD ACE, PCRATIOUR    Lab Results   Component Value Date TSH 0.949 11/13/2021         Radiology Review      Other Medical Data         [1]   Past Medical History:  Diagnosis Date    Acute pancreatitis (HHS-HCC)     Barrett esophagus     Black stool 07/08/2017    Bronchitis     Colon polyp     Diabetes mellitus (CMS-HCC)     Eczema     Gastroesophageal reflux disease     HIV disease    (CMS-HCC)     Hypertension     Lack of access to transportation     Pancreatic pseudocyst (HHS-HCC)     Thrombus     IMV thrombus r/t colitis & pancreatitis 10/07/13    Uncircumcised male 05/30/2022    Visual impairment

## 2024-07-05 NOTE — Progress Notes (Signed)
 Contour Next Meter Report uploaded to careers information officer. POC glucose and A1C done today. PP Fasting. 112 mg/dL.

## 2024-07-13 MED ORDER — MECLIZINE 25 MG TABLET
ORAL_TABLET | Freq: Three times a day (TID) | ORAL | 5 refills | 0.00000 days | PRN
Start: 2024-07-13 — End: ?

## 2024-07-15 MED ORDER — MECLIZINE 25 MG TABLET
ORAL_TABLET | Freq: Three times a day (TID) | ORAL | 5 refills | 10.00000 days | Status: CP | PRN
Start: 2024-07-15 — End: ?

## 2024-07-15 NOTE — Telephone Encounter (Signed)
 Medication Requested: meclizine  (ANTIVERT )       Future Appointments   Date Time Provider Department Center   07/26/2024  9:40 AM Perla Lonni Barter, DPM [facility]ODMM[facility]   10/12/2024 10:15 AM Shlomo Camie Bohr, PA DERMMRKT [facility]   11/08/2024  9:00 AM Jinny Alm Seta, MD [facility]   01/10/2025  8:45 AM Fayette Ozell Fallow, DO [facility]     Per Provider Note: did not see note    Standing order protocol requirements met?: No    Sent to: Provider for signing    Days Supply Given: 0  Number of Refills: 0wo

## 2024-07-16 NOTE — Progress Notes (Signed)
 The [facility] has made a second and final attempt to reach this patient to refill the following medication:DUPIXENT  PEN 300 mg/2 mL pen injector (dupilumab ).      We have left voicemails on the following phone numbers: [phone], have sent a MyChart message, have sent a text message to the following phone numbers: [phone], and have sent a Mychart questionnaire..    Dates contacted: 07/06/24-07/16/24  Last scheduled delivery: 06/10/24    The patient may be at risk of non-compliance with this medication. The patient should call the [facility] at [phone]  Option 4, then Option 2: Dermatology, Gastroenterology, Rheumatology to refill medication.    Timothy Tapia   [place] [facility]

## 2024-07-20 NOTE — Progress Notes (Signed)
 07/20/2024 - Patient answered No to charging their debit/credit card in their questionnaire response. Currently, they do not have a copay for the medication(s) listed below. Debit/Credit card not needed at this time.  [facility] Refill Coordination Note    Jenson Beedle, [place]: 02/18/1966  Phone: There are no phone numbers on file.      All above HIPAA information was verified with patient.         07/16/2024     5:21 PM   Specialty Rx Medication Refill Questionnaire   Which Medications would you like refilled and shipped? 1   Please list all current allergies: Sulfur   Have you missed any doses in the last 30 days? No   Have you had any changes to your medication(s) since your last refill? No   How much of each medication do you have remaining at home? (eg. number of tablets, injections, etc.) 1   Have you experienced any side effects in the last 30 days? No   Please enter the full address (street address, city, state, zip code) where you would like your medication(s) to be delivered to. [address] [place] [address]   Please specify on which day you would like your medication(s) to arrive. Note: if you need your medication(s) within 3 days, please call the pharmacy to schedule your order at [phone]  07/22/2024   Has your insurance changed since your last refill? No   Would you like a pharmacist to call you to discuss your medication(s)? No   Do you require a signature for your package? (Note: if we are billing Medicare Part B or your order contains a controlled substance, we will require a signature) No   I have been provided my out of pocket cost for my medication and approve the pharmacy to charge the amount to my credit card on file. No   Additional Information Confirm   Additional Comments: Thank you         Completed refill call assessment today to schedule patient's medication shipment from the [facility] [phone]).  All relevant notes have been reviewed.       Confirmed patient received a Conservation Officer, Historic Buildings and a Surveyor, Mining with first shipment. The patient will receive a drug information handout for each medication shipped and additional FDA Medication Guides as required.         REFERRAL TO PHARMACIST     Referral to the pharmacist: Not needed      [facility]     Shipping address confirmed in Epic.     Delivery Scheduled: Yes, Expected medication delivery date: 07/22/24.     Medication will be delivered via Next Day Courier to the prescription address in Epic WAM.    Dena LOISE Dolores   [facility] Specialty Technician

## 2024-07-21 MED FILL — DUPIXENT 300 MG/2 ML SUBCUTANEOUS PEN INJECTOR: SUBCUTANEOUS | 28 days supply | Qty: 8 | Fill #10

## 2024-07-22 NOTE — Telephone Encounter (Signed)
 Called patient regarding rescheduling 1/26 appointment. Patient aware of new appointment day and time.

## 2024-07-27 DIAGNOSIS — K863 Pseudocyst of pancreas: Principal | ICD-10-CM

## 2024-07-27 MED ORDER — CREON 24,000-76,000-120,000 UNIT CAPSULE,DELAYED RELEASE
ORAL_CAPSULE | 9 refills | 0.00000 days
Start: 2024-07-27 — End: ?

## 2024-07-27 NOTE — Telephone Encounter (Signed)
 Medication Requested: Creon       Future Appointments   Date Time Provider Department Center   08/16/2024  1:00 PM Perla Lonni Barter, [place] [facility]   10/12/2024 10:15 AM Shlomo Camie Bohr, [place] DERMMR[facility]   11/08/2024  9:00 AM Jinny Alm Seta, MD UNCIN[facility]IS[facility]   01/10/2025  8:45 AM Fayette Ozell Fallow, DO UNCDI[facility]ENDET [facility]     Per Provider Note: On current med list     Standing order protocol requirements met?: No    Sent to: Provider for signing    Days Supply Given: TBD   Number of Refills: TBD

## 2024-07-29 DIAGNOSIS — I10 Essential (primary) hypertension: Principal | ICD-10-CM

## 2024-07-29 MED ORDER — MIRTAZAPINE 30 MG TABLET
ORAL_TABLET | Freq: Every day | ORAL | 3 refills | 30.00000 days | Status: CP
Start: 2024-07-29 — End: ?

## 2024-07-29 MED ORDER — AMLODIPINE 5 MG TABLET
ORAL_TABLET | Freq: Every day | ORAL | 3 refills | 30.00000 days | Status: CP
Start: 2024-07-29 — End: ?

## 2024-07-29 NOTE — Telephone Encounter (Signed)
 Medication Requested: mirtazapine  (REMERON ) 30 MG tablet, amlodipine  (NORVASC ) 5 MG tablet       Future Appointments   Date Time Provider Department Center   08/16/2024  1:00 PM Perla Lonni Barter, [place] [facility]   10/12/2024 10:15 AM Shlomo Camie Bohr, [place] [facility]ERM[facility]   11/08/2024  9:00 AM Jinny Alm Seta, MD [facility]   01/10/2025  8:45 AM Fayette Ozell Fallow, DO [facility]CD[facility]END[facility]     Per Provider Note: On current medication list 12/01/23    Standing order protocol requirements met?: Yes    Sent to: Pharmacy per protocol    mirtazapine  (REMERON ) 30 MG tablet  Days Supply Given: 30 days  Number of Refills: 3    amlodipine  (NORVASC ) 5 MG tablet   Days Supply Given: 30 days  Number of Refills: 3

## 2024-07-30 MED ORDER — CREON 24,000-76,000-120,000 UNIT CAPSULE,DELAYED RELEASE
ORAL_CAPSULE | ORAL | 10 refills | 0.00000 days | Status: CP
Start: 2024-07-30 — End: ?

## 2024-08-11 ENCOUNTER — Ambulatory Visit
